# Patient Record
Sex: Male | Born: 1970 | Race: Black or African American | Hispanic: No | Smoking: Current every day smoker
Health system: Southern US, Community
[De-identification: ages and names within clinical notes are randomized; demographics above are authoritative.]

## PROBLEM LIST (undated history)

## (undated) DIAGNOSIS — R7303 Prediabetes: Secondary | ICD-10-CM

## (undated) DIAGNOSIS — I10 Essential (primary) hypertension: Secondary | ICD-10-CM

## (undated) DIAGNOSIS — R0600 Dyspnea, unspecified: Secondary | ICD-10-CM

## (undated) DIAGNOSIS — M109 Gout, unspecified: Secondary | ICD-10-CM

## (undated) DIAGNOSIS — F419 Anxiety disorder, unspecified: Secondary | ICD-10-CM

## (undated) DIAGNOSIS — J45909 Unspecified asthma, uncomplicated: Secondary | ICD-10-CM

## (undated) DIAGNOSIS — M1990 Unspecified osteoarthritis, unspecified site: Secondary | ICD-10-CM

## (undated) HISTORY — PX: OTHER SURGICAL HISTORY: SHX169

## (undated) HISTORY — PX: NO PAST SURGERIES: SHX2092

## (undated) HISTORY — PX: WISDOM TOOTH EXTRACTION: SHX21

---

## 1997-12-17 ENCOUNTER — Emergency Department (HOSPITAL_COMMUNITY): Admission: EM | Admit: 1997-12-17 | Discharge: 1997-12-17 | Payer: Self-pay | Admitting: Emergency Medicine

## 1998-02-23 ENCOUNTER — Emergency Department (HOSPITAL_COMMUNITY): Admission: EM | Admit: 1998-02-23 | Discharge: 1998-02-24 | Payer: Self-pay | Admitting: Emergency Medicine

## 1998-03-08 ENCOUNTER — Emergency Department (HOSPITAL_COMMUNITY): Admission: EM | Admit: 1998-03-08 | Discharge: 1998-03-08 | Payer: Self-pay | Admitting: Emergency Medicine

## 1999-08-10 ENCOUNTER — Emergency Department (HOSPITAL_COMMUNITY): Admission: EM | Admit: 1999-08-10 | Discharge: 1999-08-10 | Payer: Self-pay

## 2000-05-09 ENCOUNTER — Encounter: Payer: Self-pay | Admitting: Emergency Medicine

## 2000-05-09 ENCOUNTER — Emergency Department (HOSPITAL_COMMUNITY): Admission: EM | Admit: 2000-05-09 | Discharge: 2000-05-09 | Payer: Self-pay | Admitting: Emergency Medicine

## 2002-06-06 ENCOUNTER — Encounter: Payer: Self-pay | Admitting: Emergency Medicine

## 2002-06-06 ENCOUNTER — Emergency Department (HOSPITAL_COMMUNITY): Admission: EM | Admit: 2002-06-06 | Discharge: 2002-06-06 | Payer: Self-pay | Admitting: Emergency Medicine

## 2003-01-23 ENCOUNTER — Emergency Department (HOSPITAL_COMMUNITY): Admission: EM | Admit: 2003-01-23 | Discharge: 2003-01-23 | Payer: Self-pay | Admitting: Emergency Medicine

## 2003-08-22 ENCOUNTER — Emergency Department (HOSPITAL_COMMUNITY): Admission: EM | Admit: 2003-08-22 | Discharge: 2003-08-22 | Payer: Self-pay | Admitting: Emergency Medicine

## 2003-08-24 ENCOUNTER — Emergency Department (HOSPITAL_COMMUNITY): Admission: EM | Admit: 2003-08-24 | Discharge: 2003-08-24 | Payer: Self-pay | Admitting: Emergency Medicine

## 2003-11-05 ENCOUNTER — Emergency Department (HOSPITAL_COMMUNITY): Admission: EM | Admit: 2003-11-05 | Discharge: 2003-11-06 | Payer: Self-pay | Admitting: Emergency Medicine

## 2004-01-22 ENCOUNTER — Emergency Department (HOSPITAL_COMMUNITY): Admission: EM | Admit: 2004-01-22 | Discharge: 2004-01-23 | Payer: Self-pay | Admitting: Emergency Medicine

## 2004-01-28 ENCOUNTER — Emergency Department (HOSPITAL_COMMUNITY): Admission: EM | Admit: 2004-01-28 | Discharge: 2004-01-28 | Payer: Self-pay | Admitting: Emergency Medicine

## 2004-03-23 ENCOUNTER — Emergency Department (HOSPITAL_COMMUNITY): Admission: EM | Admit: 2004-03-23 | Discharge: 2004-03-23 | Payer: Self-pay | Admitting: Family Medicine

## 2004-07-22 ENCOUNTER — Emergency Department (HOSPITAL_COMMUNITY): Admission: EM | Admit: 2004-07-22 | Discharge: 2004-07-22 | Payer: Self-pay | Admitting: Emergency Medicine

## 2004-11-12 ENCOUNTER — Emergency Department (HOSPITAL_COMMUNITY): Admission: EM | Admit: 2004-11-12 | Discharge: 2004-11-12 | Payer: Self-pay | Admitting: Emergency Medicine

## 2005-01-31 ENCOUNTER — Emergency Department (HOSPITAL_COMMUNITY): Admission: EM | Admit: 2005-01-31 | Discharge: 2005-01-31 | Payer: Self-pay | Admitting: Emergency Medicine

## 2005-02-05 ENCOUNTER — Emergency Department (HOSPITAL_COMMUNITY): Admission: EM | Admit: 2005-02-05 | Discharge: 2005-02-05 | Payer: Self-pay | Admitting: Emergency Medicine

## 2005-05-03 ENCOUNTER — Emergency Department (HOSPITAL_COMMUNITY): Admission: EM | Admit: 2005-05-03 | Discharge: 2005-05-03 | Payer: Self-pay | Admitting: Emergency Medicine

## 2005-05-20 ENCOUNTER — Emergency Department (HOSPITAL_COMMUNITY): Admission: EM | Admit: 2005-05-20 | Discharge: 2005-05-21 | Payer: Self-pay | Admitting: Emergency Medicine

## 2005-12-11 ENCOUNTER — Emergency Department (HOSPITAL_COMMUNITY): Admission: EM | Admit: 2005-12-11 | Discharge: 2005-12-11 | Payer: Self-pay | Admitting: Emergency Medicine

## 2006-01-24 ENCOUNTER — Emergency Department (HOSPITAL_COMMUNITY): Admission: EM | Admit: 2006-01-24 | Discharge: 2006-01-24 | Payer: Self-pay | Admitting: Emergency Medicine

## 2006-03-06 ENCOUNTER — Emergency Department (HOSPITAL_COMMUNITY): Admission: EM | Admit: 2006-03-06 | Discharge: 2006-03-07 | Payer: Self-pay | Admitting: Emergency Medicine

## 2006-04-03 ENCOUNTER — Emergency Department (HOSPITAL_COMMUNITY): Admission: EM | Admit: 2006-04-03 | Discharge: 2006-04-03 | Payer: Self-pay | Admitting: Emergency Medicine

## 2007-03-03 ENCOUNTER — Emergency Department (HOSPITAL_COMMUNITY): Admission: EM | Admit: 2007-03-03 | Discharge: 2007-03-03 | Payer: Self-pay | Admitting: Emergency Medicine

## 2007-04-30 ENCOUNTER — Emergency Department (HOSPITAL_COMMUNITY): Admission: EM | Admit: 2007-04-30 | Discharge: 2007-04-30 | Payer: Self-pay | Admitting: Emergency Medicine

## 2007-07-21 ENCOUNTER — Emergency Department (HOSPITAL_COMMUNITY): Admission: EM | Admit: 2007-07-21 | Discharge: 2007-07-21 | Payer: Self-pay | Admitting: Family Medicine

## 2007-08-07 ENCOUNTER — Emergency Department (HOSPITAL_COMMUNITY): Admission: EM | Admit: 2007-08-07 | Discharge: 2007-08-07 | Payer: Self-pay | Admitting: Emergency Medicine

## 2008-01-25 ENCOUNTER — Emergency Department (HOSPITAL_COMMUNITY): Admission: EM | Admit: 2008-01-25 | Discharge: 2008-01-25 | Payer: Self-pay | Admitting: Emergency Medicine

## 2008-07-20 ENCOUNTER — Emergency Department (HOSPITAL_COMMUNITY): Admission: EM | Admit: 2008-07-20 | Discharge: 2008-07-20 | Payer: Self-pay | Admitting: Emergency Medicine

## 2008-08-15 ENCOUNTER — Emergency Department (HOSPITAL_COMMUNITY): Admission: AC | Admit: 2008-08-15 | Discharge: 2008-08-15 | Payer: Self-pay

## 2008-08-24 ENCOUNTER — Emergency Department (HOSPITAL_COMMUNITY): Admission: EM | Admit: 2008-08-24 | Discharge: 2008-08-24 | Payer: Self-pay | Admitting: Emergency Medicine

## 2008-10-02 ENCOUNTER — Emergency Department (HOSPITAL_COMMUNITY): Admission: EM | Admit: 2008-10-02 | Discharge: 2008-10-02 | Payer: Self-pay | Admitting: Emergency Medicine

## 2009-01-06 ENCOUNTER — Inpatient Hospital Stay (HOSPITAL_COMMUNITY): Admission: EM | Admit: 2009-01-06 | Discharge: 2009-01-07 | Payer: Self-pay | Admitting: Emergency Medicine

## 2009-01-06 ENCOUNTER — Ambulatory Visit: Payer: Self-pay | Admitting: Internal Medicine

## 2009-01-28 ENCOUNTER — Emergency Department (HOSPITAL_COMMUNITY): Admission: EM | Admit: 2009-01-28 | Discharge: 2009-01-28 | Payer: Self-pay | Admitting: Emergency Medicine

## 2009-02-13 ENCOUNTER — Emergency Department (HOSPITAL_COMMUNITY): Admission: EM | Admit: 2009-02-13 | Discharge: 2009-02-13 | Payer: Self-pay | Admitting: Family Medicine

## 2009-11-27 ENCOUNTER — Observation Stay (HOSPITAL_COMMUNITY): Admission: EM | Admit: 2009-11-27 | Discharge: 2009-11-27 | Payer: Self-pay | Admitting: Emergency Medicine

## 2010-04-05 LAB — PROTIME-INR
INR: 0.97 (ref 0.00–1.49)
Prothrombin Time: 13.1 seconds (ref 11.6–15.2)

## 2010-04-05 LAB — DIFFERENTIAL
Basophils Absolute: 0 10*3/uL (ref 0.0–0.1)
Basophils Relative: 0 % (ref 0–1)
Eosinophils Absolute: 0.2 10*3/uL (ref 0.0–0.7)
Eosinophils Relative: 3 % (ref 0–5)
Neutrophils Relative %: 63 % (ref 43–77)

## 2010-04-05 LAB — COMPREHENSIVE METABOLIC PANEL
ALT: 19 U/L (ref 0–53)
AST: 41 U/L — ABNORMAL HIGH (ref 0–37)
CO2: 25 mEq/L (ref 19–32)
Calcium: 8.4 mg/dL (ref 8.4–10.5)
Chloride: 111 mEq/L (ref 96–112)
GFR calc Af Amer: >60 mL/min (ref >60)
GFR calc non Af Amer: >60 mL/min (ref >60)
Glucose, Bld: 104 mg/dL — ABNORMAL HIGH (ref 70–99)
Sodium: 145 mEq/L (ref 135–145)
Total Bilirubin: 0.5 mg/dL (ref 0.3–1.2)

## 2010-04-05 LAB — CBC
HCT: 41 % (ref 39.0–52.0)
Hemoglobin: 13.7 g/dL (ref 13.0–17.0)
Hemoglobin: 14 g/dL (ref 13.0–17.0)
MCH: 30.2 pg (ref 26.0–34.0)
MCH: 30.3 pg (ref 26.0–34.0)
MCHC: 33.3 g/dL (ref 30.0–36.0)
MCHC: 34.1 g/dL (ref 30.0–36.0)
Platelets: 211 10*3/uL (ref 150–400)
RBC: 4.62 MIL/uL (ref 4.22–5.81)
RDW: 16.6 % — ABNORMAL HIGH (ref 11.5–15.5)

## 2010-04-05 LAB — BASIC METABOLIC PANEL
BUN: 7 mg/dL (ref 6–23)
CO2: 25 mEq/L (ref 19–32)
Calcium: 8.5 mg/dL (ref 8.4–10.5)
Creatinine, Ser: 0.91 mg/dL (ref 0.4–1.5)
GFR calc non Af Amer: >60 mL/min (ref >60)
Glucose, Bld: 94 mg/dL (ref 70–99)

## 2010-04-05 LAB — RAPID URINE DRUG SCREEN, HOSP PERFORMED
Amphetamines: NOT DETECTED
Barbiturates: NOT DETECTED
Benzodiazepines: NOT DETECTED
Cocaine: NOT DETECTED
Opiates: NOT DETECTED
Tetrahydrocannabinol: NOT DETECTED

## 2010-04-05 LAB — ETHANOL: Alcohol, Ethyl (B): 386 mg/dL — ABNORMAL HIGH (ref 0–10)

## 2010-04-05 LAB — MRSA PCR SCREENING: MRSA by PCR: NEGATIVE

## 2010-04-25 LAB — CBC
Hemoglobin: 11.7 g/dL — ABNORMAL LOW (ref 13.0–17.0)
MCHC: 34.4 g/dL (ref 30.0–36.0)
Platelets: 104 10*3/uL — ABNORMAL LOW (ref 150–400)
RDW: 15.4 % (ref 11.5–15.5)

## 2010-04-25 LAB — BASIC METABOLIC PANEL
BUN: 5 mg/dL — ABNORMAL LOW (ref 6–23)
CO2: 30 mEq/L (ref 19–32)
Calcium: 8.8 mg/dL (ref 8.4–10.5)
Glucose, Bld: 83 mg/dL (ref 70–99)
Sodium: 138 mEq/L (ref 135–145)

## 2010-04-25 LAB — PHOSPHORUS: Phosphorus: 4.7 mg/dL — ABNORMAL HIGH (ref 2.3–4.6)

## 2010-04-26 LAB — COMPREHENSIVE METABOLIC PANEL
ALT: 38 U/L (ref 0–53)
CO2: 24 mEq/L (ref 19–32)
Calcium: 9.4 mg/dL (ref 8.4–10.5)
Creatinine, Ser: 0.87 mg/dL (ref 0.4–1.5)
GFR calc non Af Amer: >60 mL/min (ref >60)
Glucose, Bld: 144 mg/dL — ABNORMAL HIGH (ref 70–99)

## 2010-04-26 LAB — URINALYSIS, ROUTINE W REFLEX MICROSCOPIC
Bilirubin Urine: NEGATIVE
Glucose, UA: NEGATIVE mg/dL
Ketones, ur: NEGATIVE mg/dL
pH: 6.5 (ref 5.0–8.0)

## 2010-04-26 LAB — URINE MICROSCOPIC-ADD ON

## 2010-04-26 LAB — DIFFERENTIAL
Eosinophils Absolute: 0 10*3/uL (ref 0.0–0.7)
Lymphocytes Relative: 16 % (ref 12–46)
Lymphs Abs: 1.2 10*3/uL (ref 0.7–4.0)
Neutrophils Relative %: 72 % (ref 43–77)

## 2010-04-26 LAB — VITAMIN B12: Vitamin B-12: 663 pg/mL (ref 211–911)

## 2010-04-26 LAB — RAPID URINE DRUG SCREEN, HOSP PERFORMED
Barbiturates: NOT DETECTED
Opiates: NOT DETECTED

## 2010-04-26 LAB — CBC
HCT: 38.2 % — ABNORMAL LOW (ref 39.0–52.0)
Hemoglobin: 13.1 g/dL (ref 13.0–17.0)
MCHC: 34.3 g/dL (ref 30.0–36.0)
MCV: 104.7 fL — ABNORMAL HIGH (ref 78.0–100.0)
RBC: 3.65 MIL/uL — ABNORMAL LOW (ref 4.22–5.81)

## 2010-04-26 LAB — PHOSPHORUS: Phosphorus: 3.8 mg/dL (ref 2.3–4.6)

## 2010-04-26 LAB — FOLATE: Folate: >20 ng/mL

## 2010-04-29 LAB — DIFFERENTIAL
Basophils Absolute: 0 10*3/uL (ref 0.0–0.1)
Eosinophils Absolute: 0.1 10*3/uL (ref 0.0–0.7)
Eosinophils Relative: 2 % (ref 0–5)
Lymphocytes Relative: 19 % (ref 12–46)
Neutrophils Relative %: 68 % (ref 43–77)

## 2010-04-29 LAB — BASIC METABOLIC PANEL
BUN: 7 mg/dL (ref 6–23)
CO2: 27 mEq/L (ref 19–32)
Chloride: 99 mEq/L (ref 96–112)
Creatinine, Ser: 0.64 mg/dL (ref 0.4–1.5)
GFR calc Af Amer: >60 mL/min (ref >60)
GFR calc non Af Amer: >60 mL/min (ref >60)
Sodium: 140 mEq/L (ref 135–145)

## 2010-04-29 LAB — CBC
HCT: 35.8 % — ABNORMAL LOW (ref 39.0–52.0)
Platelets: 90 10*3/uL — ABNORMAL LOW (ref 150–400)
RDW: 15.6 % — ABNORMAL HIGH (ref 11.5–15.5)
WBC: 5.4 10*3/uL (ref 4.0–10.5)

## 2010-04-29 LAB — RAPID URINE DRUG SCREEN, HOSP PERFORMED
Barbiturates: NOT DETECTED
Opiates: NOT DETECTED

## 2010-05-01 LAB — TYPE AND SCREEN
ABO/RH(D): AB POS
Antibody Screen: NEGATIVE

## 2010-05-01 LAB — COMPREHENSIVE METABOLIC PANEL
BUN: 12 mg/dL (ref 6–23)
CO2: 22 mEq/L (ref 19–32)
Calcium: 8.9 mg/dL (ref 8.4–10.5)
Creatinine, Ser: 1.21 mg/dL (ref 0.4–1.5)
GFR calc non Af Amer: >60 mL/min (ref >60)
Glucose, Bld: 111 mg/dL — ABNORMAL HIGH (ref 70–99)

## 2010-05-01 LAB — CBC
Hemoglobin: 13.3 g/dL (ref 13.0–17.0)
MCHC: 33.8 g/dL (ref 30.0–36.0)
MCV: 104.5 fL — ABNORMAL HIGH (ref 78.0–100.0)
RBC: 3.77 MIL/uL — ABNORMAL LOW (ref 4.22–5.81)

## 2010-05-01 LAB — DIFFERENTIAL
Eosinophils Absolute: 0.1 10*3/uL (ref 0.0–0.7)
Lymphocytes Relative: 42 % (ref 12–46)
Lymphs Abs: 2.6 10*3/uL (ref 0.7–4.0)
Neutro Abs: 3.1 10*3/uL (ref 1.7–7.7)
Neutrophils Relative %: 50 % (ref 43–77)

## 2010-05-09 LAB — POCT RAPID STREP A (OFFICE): Streptococcus, Group A Screen (Direct): NEGATIVE
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
## 2010-10-14 LAB — URINALYSIS, ROUTINE W REFLEX MICROSCOPIC
Glucose, UA: NEGATIVE
Ketones, ur: 15 — AB
pH: 5.5

## 2010-10-14 LAB — URINE MICROSCOPIC-ADD ON

## 2010-10-14 LAB — CBC
HCT: 40.7
Platelets: 161
WBC: 5.9

## 2010-10-14 LAB — DIFFERENTIAL
Eosinophils Relative: 3
Lymphocytes Relative: 36
Lymphs Abs: 2.1

## 2010-12-21 IMAGING — CR DG KNEE COMPLETE 4+V*R*
4 series · 4 of 4 positions shown · non-contrast
Comparison: 07/21/2007

CLINICAL DATA: Fell.  Pain.

RIGHT KNEE - COMPLETE 4+ VIEW

[t knee ap right]
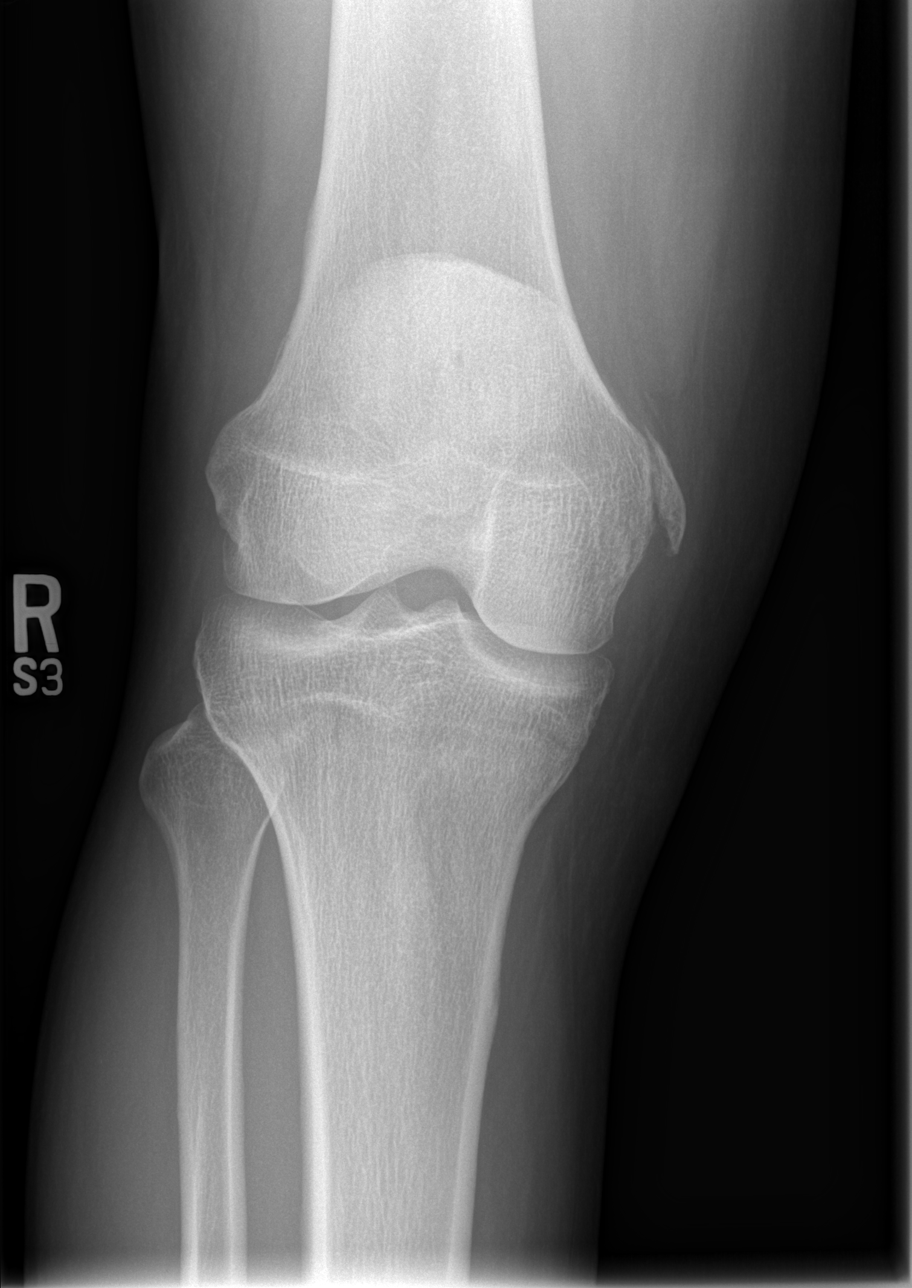

[t knee oblique right (1 of 2)]
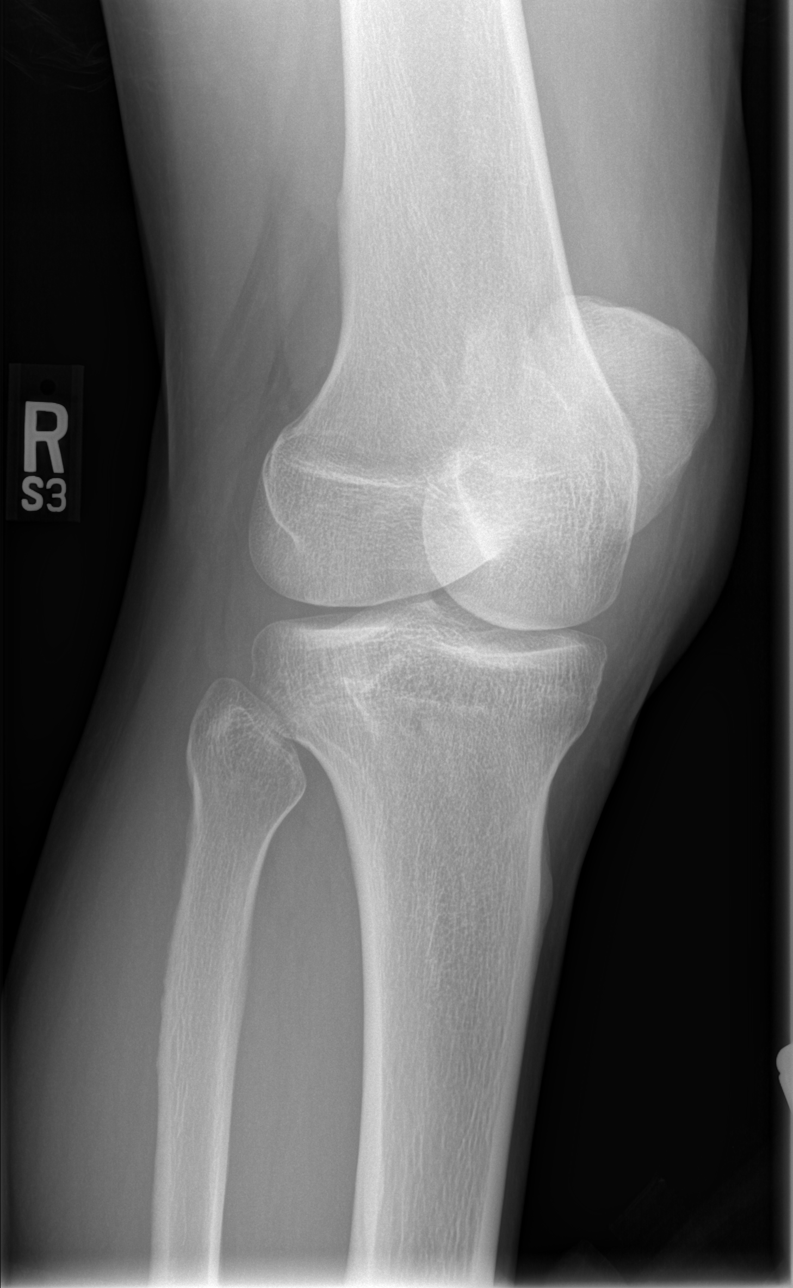

[t knee oblique right (2 of 2)]
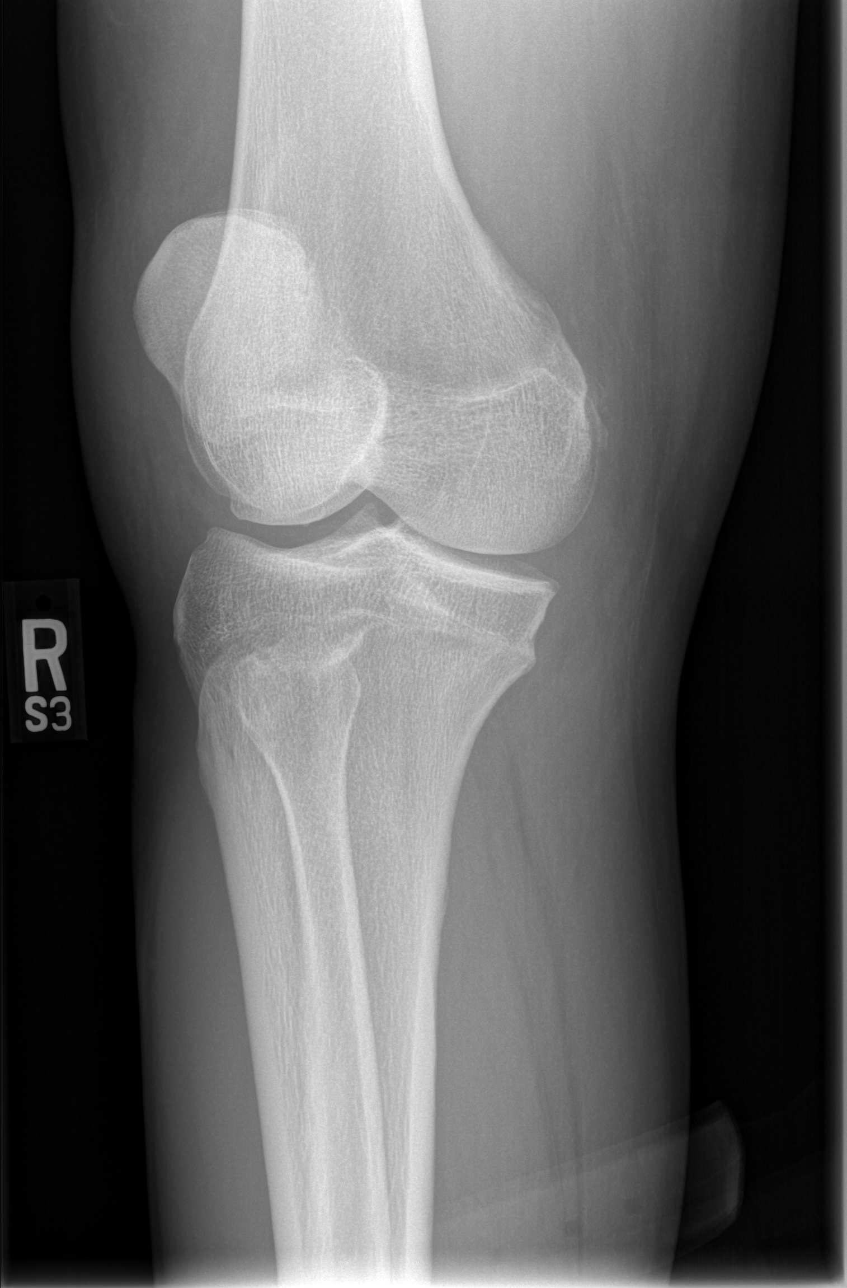

[t knee lat right]
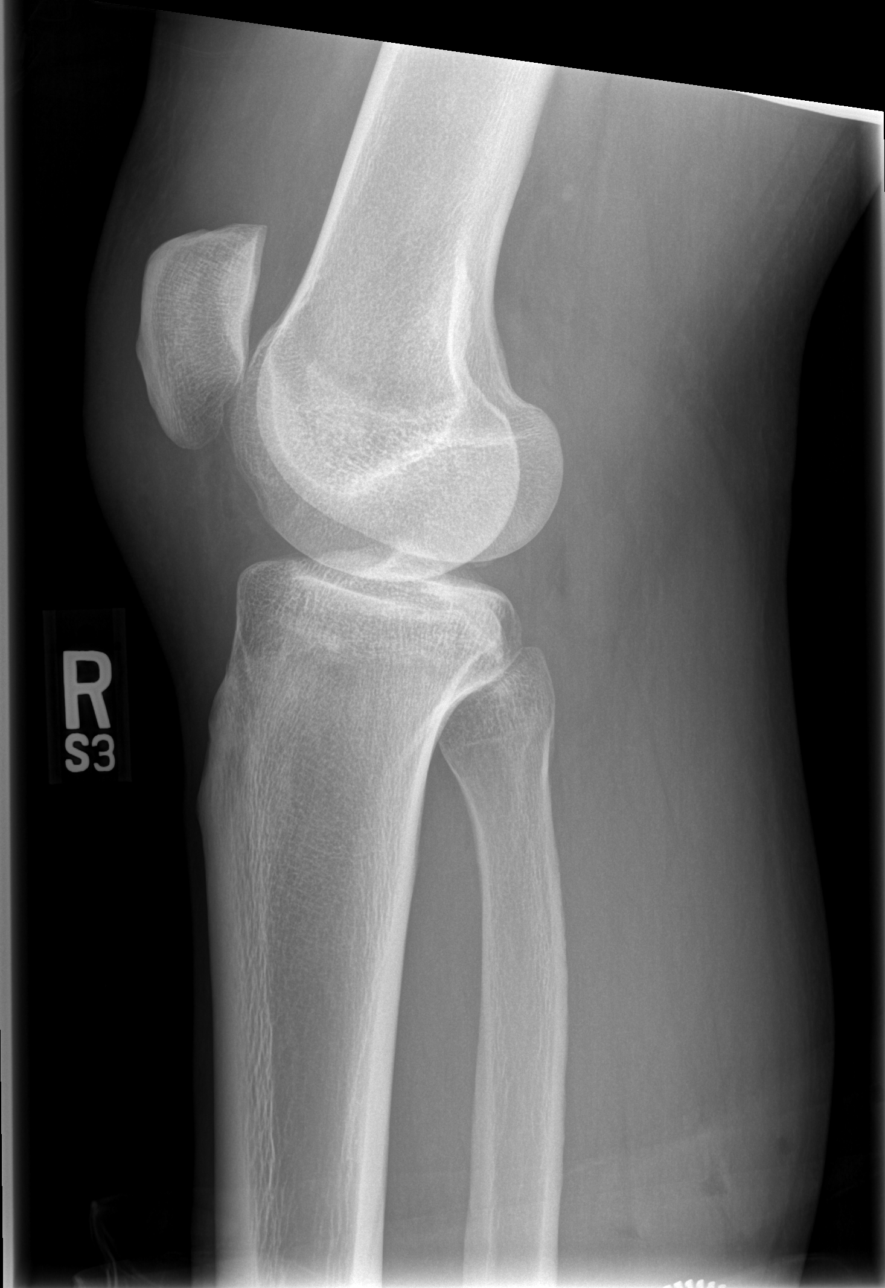

[4 of 4 positions shown; findings below may reference images not displayed]

FINDINGS: There is an acute  avulsion at the medial aspect of the
medial femoral condyle consistent with  avulsion at the middle
collateral ligament attachment.  There is pronounced soft tissue
swelling diffusely.  There is a joint effusion.  There may be a
fracture of the anterolateral aspect of the lateral femoral
condyle.  Findings raise the possibility of an ACL tear mechanism.
IMPRESSION: Joint effusion.  Soft tissue swelling.  Avulsion fracture medial
aspect medial femoral condyle related to the MCL attachment.
Question anterolateral femoral condyle osteochondral fracture.
Findings suggest possible ACL injury mechanism.

## 2011-01-25 ENCOUNTER — Encounter: Payer: Self-pay | Admitting: *Deleted

## 2011-01-25 ENCOUNTER — Emergency Department (HOSPITAL_COMMUNITY)
Admission: EM | Admit: 2011-01-25 | Discharge: 2011-01-25 | Discharge status: Against Medical Advice | Payer: Self-pay | Attending: Emergency Medicine | Admitting: Emergency Medicine

## 2011-01-25 DIAGNOSIS — S61209A Unspecified open wound of unspecified finger without damage to nail, initial encounter: Secondary | ICD-10-CM | POA: Insufficient documentation

## 2011-01-25 DIAGNOSIS — X58XXXA Exposure to other specified factors, initial encounter: Secondary | ICD-10-CM | POA: Insufficient documentation

## 2011-01-25 HISTORY — DX: Essential (primary) hypertension: I10

## 2011-01-25 NOTE — ED Notes (Signed)
No patient in room.

## 2011-01-25 NOTE — ED Notes (Signed)
No patient in room at this time.
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
## 2011-03-01 ENCOUNTER — Emergency Department (HOSPITAL_COMMUNITY)
Admission: EM | Admit: 2011-03-01 | Discharge: 2011-03-01 | Disposition: A | Discharge status: Home / Self Care | Payer: Self-pay | Attending: Emergency Medicine | Admitting: Emergency Medicine

## 2011-03-01 ENCOUNTER — Encounter (HOSPITAL_COMMUNITY): Payer: Self-pay | Admitting: Emergency Medicine

## 2011-03-01 ENCOUNTER — Encounter (HOSPITAL_COMMUNITY): Payer: Self-pay | Admitting: *Deleted

## 2011-03-01 ENCOUNTER — Emergency Department (INDEPENDENT_AMBULATORY_CARE_PROVIDER_SITE_OTHER)
Admission: EM | Admit: 2011-03-01 | Discharge: 2011-03-01 | Disposition: A | Discharge status: Other Acute Inpatient Hospital | Payer: Self-pay | Source: Home / Self Care | Attending: Emergency Medicine | Admitting: Emergency Medicine

## 2011-03-01 DIAGNOSIS — I82409 Acute embolism and thrombosis of unspecified deep veins of unspecified lower extremity: Secondary | ICD-10-CM

## 2011-03-01 DIAGNOSIS — I82401 Acute embolism and thrombosis of unspecified deep veins of right lower extremity: Secondary | ICD-10-CM

## 2011-03-01 DIAGNOSIS — R609 Edema, unspecified: Secondary | ICD-10-CM | POA: Insufficient documentation

## 2011-03-01 DIAGNOSIS — M79609 Pain in unspecified limb: Secondary | ICD-10-CM | POA: Insufficient documentation

## 2011-03-01 DIAGNOSIS — R600 Localized edema: Secondary | ICD-10-CM

## 2011-03-01 DIAGNOSIS — J45909 Unspecified asthma, uncomplicated: Secondary | ICD-10-CM | POA: Insufficient documentation

## 2011-03-01 DIAGNOSIS — I10 Essential (primary) hypertension: Secondary | ICD-10-CM | POA: Insufficient documentation

## 2011-03-01 DIAGNOSIS — M7989 Other specified soft tissue disorders: Secondary | ICD-10-CM | POA: Insufficient documentation

## 2011-03-01 MED ORDER — ENOXAPARIN SODIUM 100 MG/ML ~~LOC~~ SOLN
95.0000 mg | SUBCUTANEOUS | Status: AC
  Administered 2011-03-01: 95 mg via SUBCUTANEOUS
  Filled 2011-03-01: qty 1

## 2011-03-01 MED ORDER — ENOXAPARIN SODIUM 100 MG/ML ~~LOC~~ SOLN: 95.0000 mg | Freq: Once | SUBCUTANEOUS | Status: DC

## 2011-03-01 NOTE — ED Provider Notes (Signed)
Medical screening examination/treatment/procedure(s) were performed by non-physician practitioner and as supervising physician I was immediately available for consultation/collaboration.  Tryston Gilliam, MD 03/01/11 2054 

## 2011-03-01 NOTE — ED Provider Notes (Addendum)
History     CSN: [id number]  Arrival date & time 03/01/11  1659   First MD Initiated Contact with Patient 03/01/11 1904      Chief Complaint  Patient presents with  . Leg Pain  . Leg Swelling    (Consider location/radiation/quality/duration/timing/severity/associated sxs/prior treatment) HPI Comments: [name] presents to the urgent care center tonight complaining of right cough swelling and pain this started about 3 days worsening in the last day or so. Denies any shortness of breath chest pain or palpitations. He describes today's pain recently not moving a lot and watching a lot of TV for unknown reasons patient does not elaborate. Patient looks in mild discomfort and concern. Denies previous history of any blood disorders previous blood clots or lung problems.  Patient is a 41 y.o. male presenting with leg pain. The history is provided by the patient.  Leg Pain  There was no injury mechanism. The pain is present in the right leg. The pain is at a severity of 10/10. The pain is severe. The pain has been constant since onset. Associated symptoms include inability to bear weight. Pertinent negatives include no numbness, no loss of motion and no loss of sensation. The symptoms are aggravated by activity, bearing weight and palpation. He has tried nothing for the symptoms.    Past Medical History  Diagnosis Date  . Hypertension   . Asthma     History reviewed. No pertinent past surgical history.  No family history on file.  History  Substance Use Topics  . Smoking status: Current Everyday Smoker  . Smokeless tobacco: Not on file  . Alcohol Use: Yes      Review of Systems  Constitutional: Negative for fever and fatigue.  Cardiovascular: Positive for leg swelling. Negative for chest pain and palpitations.  Musculoskeletal: Positive for gait problem. Negative for joint swelling.  Neurological: Negative for dizziness, syncope and numbness.    Allergies  Review of  patient's allergies indicates no known allergies.  Home Medications  No current outpatient prescriptions on file.  BP 191/78  Pulse 125  Temp(Src) 99 F (37.2 C) (Oral)  Resp 20  SpO2 96%  Physical Exam  Nursing note and vitals reviewed. Eyes: Conjunctivae are normal.  Cardiovascular: Regular rhythm.   No extrasystoles are present. Tachycardia present.   Pulmonary/Chest: Effort normal and breath sounds normal. He has no wheezes. He has no rhonchi.  Musculoskeletal:       Right knee: He exhibits swelling. He exhibits no deformity.       Legs: Skin: No rash noted.    ED Course  Procedures (including critical care time)  Labs Reviewed - No data to display No results found.   1. Deep vein blood clot of right lower extremity       MDM  High index suspicion for DVT RLE        Jimmie Molly, MD 03/01/11 1938  Jimmie Molly, MD 03/01/11 4098

## 2011-03-01 NOTE — ED Notes (Signed)
Pt from urgent care with c/o rt lower extremity edema x three days. R/O DVT.

## 2011-03-01 NOTE — ED Provider Notes (Signed)
History     CSN: [id number]  Arrival date & time 03/01/11  1946   First MD Initiated Contact with Patient 03/01/11 2011      Chief Complaint  Patient presents with  . 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  Was seen at urgent care Center.  The emergency room for further evaluation.  Unfortunately, is after 7:00 at night , and we'll have to give dose of Lovenox in the ED and bring patient back in the morning for a vascular study of his lower leg to rule out DVT  Patient is a 41 y.o. male presenting with leg pain. The history is provided by the patient.  Leg Pain  The incident occurred more than 2 days ago. The incident occurred at home. The pain is present in the right leg. The quality of the pain is described as aching. The pain is at a severity of 3/10. The pain is mild. The pain has been constant since onset. Pertinent negatives include no numbness, no inability to bear weight, no loss of motion, no muscle weakness, no loss of sensation and no tingling. The symptoms are aggravated by activity. He has tried nothing for the symptoms.    Past Medical History  Diagnosis Date  . Hypertension   . Asthma     History reviewed. No pertinent past surgical history.  No family history on file.  History  Substance Use Topics  . Smoking status: Current Everyday Smoker  . Smokeless tobacco: Not on file  . Alcohol Use: Yes      Review of Systems  Constitutional: Negative for fever and chills.  Respiratory: Negative for shortness of breath.   Cardiovascular: Positive for leg swelling. Negative for chest pain.  Neurological: Negative for dizziness, tingling and numbness.    Allergies  Review of patient's allergies indicates no known allergies.  Home Medications  No current  outpatient prescriptions on file.  BP 136/95  Pulse 109  Temp(Src) 98.7 F (37.1 C) (Oral)  Wt 211 lb 3.2 oz (95.8 kg)  SpO2 96%  Physical Exam  Constitutional: He is oriented to person, place, and time. He appears well-developed.  HENT:  Head: Normocephalic.  Eyes: Pupils are equal, round, and reactive to light.  Neck: Normal range of motion.  Cardiovascular: Normal rate.   Pulmonary/Chest: Effort normal.  Musculoskeletal: Normal range of motion.  Neurological: He is alert and oriented to person, place, and time.  Skin: Skin is warm and dry.    ED Course  Procedures (including critical care time)  Labs Reviewed - No data to display No results found.   1. Lower extremity edema       MDM  We'll administer Lovenox , weight appropriate 95 milligrams bring patient back in the morning for ultrasound of his lower leg to rule out DVT        Arman Filter, NP 03/01/11 2053  Arman Filter, NP 03/01/11 2054

## 2011-03-01 NOTE — ED Notes (Signed)
PT HERE EITH SUDDEN ONSET OF RIGHT CALF SWELLING THAT STARTED X 3 DYS AGO.PT STATES THE PAIN HAS AFFECTED MOBILITY.SHOOTING CONSTANT THROB,ACHY PAIN GOING TO THIGH

## 2011-03-02 ENCOUNTER — Emergency Department (HOSPITAL_COMMUNITY)
Admission: EM | Admit: 2011-03-02 | Discharge: 2011-03-02 | Discharge status: Against Medical Advice | Payer: Self-pay | Attending: Emergency Medicine | Admitting: Emergency Medicine

## 2011-03-02 ENCOUNTER — Ambulatory Visit (HOSPITAL_COMMUNITY)
Admission: RE | Admit: 2011-03-02 | Discharge: 2011-03-02 | Disposition: A | Discharge status: Home / Self Care | Payer: Self-pay | Source: Ambulatory Visit | Attending: Emergency Medicine | Admitting: Emergency Medicine

## 2011-03-02 ENCOUNTER — Encounter (HOSPITAL_COMMUNITY): Payer: Self-pay | Admitting: Emergency Medicine

## 2011-03-02 ENCOUNTER — Emergency Department (HOSPITAL_COMMUNITY)
Admission: EM | Admit: 2011-03-02 | Discharge: 2011-03-02 | Disposition: A | Discharge status: Home / Self Care | Payer: Self-pay | Attending: Emergency Medicine | Admitting: Emergency Medicine

## 2011-03-02 DIAGNOSIS — M25569 Pain in unspecified knee: Secondary | ICD-10-CM | POA: Insufficient documentation

## 2011-03-02 DIAGNOSIS — M660 Rupture of popliteal cyst: Secondary | ICD-10-CM

## 2011-03-02 DIAGNOSIS — I10 Essential (primary) hypertension: Secondary | ICD-10-CM | POA: Insufficient documentation

## 2011-03-02 DIAGNOSIS — M7989 Other specified soft tissue disorders: Secondary | ICD-10-CM | POA: Insufficient documentation

## 2011-03-02 DIAGNOSIS — R609 Edema, unspecified: Secondary | ICD-10-CM

## 2011-03-02 DIAGNOSIS — M7120 Synovial cyst of popliteal space [Baker], unspecified knee: Secondary | ICD-10-CM | POA: Insufficient documentation

## 2011-03-02 DIAGNOSIS — J45909 Unspecified asthma, uncomplicated: Secondary | ICD-10-CM | POA: Insufficient documentation

## 2011-03-02 DIAGNOSIS — M25469 Effusion, unspecified knee: Secondary | ICD-10-CM | POA: Insufficient documentation

## 2011-03-02 DIAGNOSIS — M79609 Pain in unspecified limb: Secondary | ICD-10-CM

## 2011-03-02 DIAGNOSIS — Z0389 Encounter for observation for other suspected diseases and conditions ruled out: Secondary | ICD-10-CM | POA: Insufficient documentation

## 2011-03-02 DIAGNOSIS — Z5320 Procedure and treatment not carried out because of patient's decision for unspecified reasons: Secondary | ICD-10-CM | POA: Insufficient documentation

## 2011-03-02 DIAGNOSIS — J069 Acute upper respiratory infection, unspecified: Secondary | ICD-10-CM | POA: Insufficient documentation

## 2011-03-02 MED ORDER — IBUPROFEN 600 MG PO TABS: 600.0000 mg | ORAL_TABLET | Freq: Four times a day (QID) | ORAL | Status: AC | PRN

## 2011-03-02 MED ORDER — OXYCODONE-ACETAMINOPHEN 7.5-325 MG PO TABS: 1.0000 | ORAL_TABLET | ORAL | Status: AC | PRN

## 2011-03-02 NOTE — ED Provider Notes (Signed)
History     CSN: [id number]  Arrival date & time 03/02/11  1056   First MD Initiated Contact with Patient 03/02/11 1246      Chief Complaint  Patient presents with  . URI  . Knee Pain     HPI Pt c/o right knee pain and had doppler today and found ruptured bakers cyst; pt sts need pain meds and has had cold sx x several days  Past Medical History  Diagnosis Date  . Hypertension   . Asthma     History reviewed. No pertinent past surgical history.  History reviewed. No pertinent family history.  History  Substance Use Topics  . Smoking status: Current Everyday Smoker  . Smokeless tobacco: Not on file  . Alcohol Use: Yes      Review of Systems Negative except as noted in history of present illness Allergies  Review of patient's allergies indicates no known allergies.  Home Medications   Current Outpatient Rx  Name Route Sig Dispense Refill  . IBUPROFEN 600 MG PO TABS Oral Take 1 tablet (600 mg total) by mouth every 6 (six) hours as needed for pain. 30 tablet 0  . OXYCODONE-ACETAMINOPHEN 7.5-325 MG PO TABS Oral Take 1 tablet by mouth every 4 (four) hours as needed for pain. 30 tablet 0    BP 149/109  Pulse 105  Temp(Src) 98.4 F (36.9 C) (Oral)  Resp 18  SpO2 97%  Physical Exam  Nursing note and vitals reviewed. Constitutional: He is oriented to person, place, and time. He appears well-developed and well-nourished. No distress.  HENT:  Head: Normocephalic and atraumatic.  Eyes: Pupils are equal, round, and reactive to light.  Neck: Normal range of motion.  Cardiovascular: Normal rate and intact distal pulses.   Pulmonary/Chest: No respiratory distress.  Abdominal: Normal appearance. He exhibits no distension.  Musculoskeletal:       Legs: Neurological: He is alert and oriented to person, place, and time. No cranial nerve deficit.  Skin: Skin is warm and dry. No rash noted.  Psychiatric: He has a normal mood and affect. His behavior is normal.    ED  Course  Procedures (including critical care time)   Labs Reviewed  RAPID STREP SCREEN  LAB REPORT - SCANNED   No results found.   1. Ruptured Bakers cyst   2. URI, acute       MDM         Nelia Shi, MD 03/03/11 [phone]
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
## 2011-03-02 NOTE — ED Notes (Signed)
Pt c/o right knee pain and had doppler today and found ruptured bakers cyst; pt sts need pain meds and has had cold sx x several days

## 2011-09-15 ENCOUNTER — Emergency Department (HOSPITAL_COMMUNITY): Payer: Self-pay

## 2011-09-15 ENCOUNTER — Observation Stay (HOSPITAL_COMMUNITY)
Admission: EM | Admit: 2011-09-15 | Discharge: 2011-09-16 | Disposition: A | Discharge status: Home / Self Care | Payer: Self-pay | Attending: Emergency Medicine | Admitting: Emergency Medicine

## 2011-09-15 ENCOUNTER — Encounter (HOSPITAL_COMMUNITY): Payer: Self-pay | Admitting: *Deleted

## 2011-09-15 DIAGNOSIS — S0100XA Unspecified open wound of scalp, initial encounter: Principal | ICD-10-CM | POA: Insufficient documentation

## 2011-09-15 DIAGNOSIS — Y9355 Activity, bike riding: Secondary | ICD-10-CM | POA: Insufficient documentation

## 2011-09-15 DIAGNOSIS — Y929 Unspecified place or not applicable: Secondary | ICD-10-CM | POA: Insufficient documentation

## 2011-09-15 DIAGNOSIS — W19XXXA Unspecified fall, initial encounter: Secondary | ICD-10-CM | POA: Insufficient documentation

## 2011-09-15 DIAGNOSIS — F1010 Alcohol abuse, uncomplicated: Secondary | ICD-10-CM | POA: Insufficient documentation

## 2011-09-15 DIAGNOSIS — F17200 Nicotine dependence, unspecified, uncomplicated: Secondary | ICD-10-CM | POA: Insufficient documentation

## 2011-09-15 DIAGNOSIS — S0101XA Laceration without foreign body of scalp, initial encounter: Secondary | ICD-10-CM

## 2011-09-15 DIAGNOSIS — F10929 Alcohol use, unspecified with intoxication, unspecified: Secondary | ICD-10-CM

## 2011-09-15 LAB — RAPID URINE DRUG SCREEN, HOSP PERFORMED
Amphetamines: NOT DETECTED
Barbiturates: NOT DETECTED
Benzodiazepines: NOT DETECTED
Cocaine: NOT DETECTED
Opiates: NOT DETECTED
Tetrahydrocannabinol: NOT DETECTED

## 2011-09-15 LAB — ETHANOL: Alcohol, Ethyl (B): 408 mg/dL (ref 0–11)

## 2011-09-15 MED ORDER — SODIUM CHLORIDE 0.9 % IV BOLUS (SEPSIS): 1000.0000 mL | Freq: Once | INTRAVENOUS | Status: DC

## 2011-09-15 MED ORDER — FOLIC ACID 1 MG PO TABS: 1.0000 mg | ORAL_TABLET | Freq: Once | ORAL | Status: DC

## 2011-09-15 MED ORDER — VITAMIN B-1 100 MG PO TABS: 100.0000 mg | ORAL_TABLET | Freq: Once | ORAL | Status: DC

## 2011-09-15 MED ORDER — SODIUM CHLORIDE 0.9 % IV BOLUS (SEPSIS)
1000.0000 mL | Freq: Once | INTRAVENOUS | Status: AC
  Administered 2011-09-15: 1000 mL via INTRAVENOUS

## 2011-09-15 NOTE — ED Notes (Signed)
Pt first IV bolus continues to infuse at this time, will start second bolus once it is completed.
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
## 2011-09-16 ENCOUNTER — Encounter (HOSPITAL_COMMUNITY): Payer: Self-pay

## 2011-09-16 LAB — GLUCOSE, CAPILLARY: Glucose-Capillary: 85 mg/dL (ref 70–99)

## 2011-09-16 NOTE — ED Notes (Signed)
Pt awakened for vitals and CBG check. Could not stay awake to answer more than one question at a time. Refused PO medication. Unable to give answer other than "yes or no". Denied pain. Respirations even and regular. Vitals WNL. NAD.

## 2011-09-16 NOTE — ED Provider Notes (Signed)
Patient is resting comfortably in bed. He would like to go home and feels well enough to do so. I explained that he should be able to ambulate without difficulty before discharge.   Filed Vitals:   09/16/11 0513  BP: 122/78  Pulse: 72  Temp:   Resp: 12   Patient is awake and alert and responding to questions appropriately. Heart has RRR. Lungs are CTA bilaterally. He is able to ambulate without difficulty. His head laceration has staples intact and is not bleeding.   Patient will eat breakfast and then be discharged with follow up for staple removal. Patient is agreeable to plan.   Emilia Beck, [place] 09/16/11 [phone]

## 2011-09-16 NOTE — ED Notes (Signed)
Pt sleeping. Respirations regular and even. NAD. Speech mumbled. Refused PO medications. Orientated to person only.

## 2011-09-16 NOTE — ED Notes (Addendum)
Pt sleeping. Difficult to arouse. Responds to name. Immediately falls back to sleep. Unable to verbalize pain. Respirations regular and even. Skin warm and dry. Pt unable to take oral meds at this time. NAD.

## 2011-09-17 NOTE — ED Provider Notes (Signed)
Medical screening examination/treatment/procedure(s) were performed by non-physician practitioner and as supervising physician I was immediately available for consultation/collaboration.  Raeford Razor, MD 09/17/11 [phone]

## 2011-10-09 ENCOUNTER — Encounter (HOSPITAL_COMMUNITY): Payer: Self-pay | Admitting: Emergency Medicine

## 2011-10-09 ENCOUNTER — Emergency Department (HOSPITAL_COMMUNITY)
Admission: EM | Admit: 2011-10-09 | Discharge: 2011-10-09 | Disposition: A | Discharge status: Home / Self Care | Payer: Self-pay | Attending: Emergency Medicine | Admitting: Emergency Medicine

## 2011-10-09 DIAGNOSIS — J45909 Unspecified asthma, uncomplicated: Secondary | ICD-10-CM | POA: Insufficient documentation

## 2011-10-09 DIAGNOSIS — I10 Essential (primary) hypertension: Secondary | ICD-10-CM | POA: Insufficient documentation

## 2011-10-09 DIAGNOSIS — F17200 Nicotine dependence, unspecified, uncomplicated: Secondary | ICD-10-CM | POA: Insufficient documentation

## 2011-10-09 DIAGNOSIS — Z4802 Encounter for removal of sutures: Secondary | ICD-10-CM | POA: Insufficient documentation

## 2011-10-09 NOTE — ED Provider Notes (Signed)
History  This chart was scribed for Daniel Anger, DO by Daniel [name]. The patient was seen in room TR04C/TR04C. Patient's care was started at 1135.     CSN: [id number]  Arrival date & time 10/09/11  1016   First MD Initiated Contact with Patient 10/09/11 1135      Chief Complaint  Patient presents with  . Suture / Staple Removal    The history is provided by the patient. No language interpreter was used.    Daniel [name] is a 41 y.o. male who presents to the Emergency Department needing staple removal from his right occipital area. Patient had 3 staples placed 2 weeks ago. Patient reports no other complaints at this time.  Denies fevers, no rash, no drainage from wound.     Past Medical History  Diagnosis Date  . Hypertension   . Asthma     History reviewed. No pertinent past surgical history.  History reviewed. No pertinent family history.  History  Substance Use Topics  . Smoking status: Current Every Day Smoker  . Smokeless tobacco: Not on file  . Alcohol Use: Yes      Review of Systems ROS: Statement: All systems negative except as marked or noted in the HPI; Constitutional: Negative for fever and chills. ; ; Eyes: Negative for eye pain, redness and discharge. ; ; ENMT: Negative for ear pain, hoarseness, nasal congestion, sinus pressure and sore throat. ; ; Cardiovascular: Negative for chest pain, palpitations, diaphoresis, dyspnea and peripheral edema. ; ; Respiratory: Negative for cough, wheezing and stridor. ; ; Gastrointestinal: Negative for nausea, vomiting, diarrhea, abdominal pain, blood in stool, hematemesis, jaundice and rectal bleeding. . ; ; Genitourinary: Negative for dysuria, flank pain and hematuria. ; ; Musculoskeletal: Negative for back pain and neck pain. Negative for swelling and trauma.; ; Skin: +staples. Negative for pruritus, rash, abrasions, blisters, bruising and skin lesion.; ; Neuro: Negative for headache, lightheadedness and neck  stiffness. Negative for weakness, altered level of consciousness , altered mental status, extremity weakness, paresthesias, involuntary movement, seizure and syncope.        Allergies  Review of patient's allergies indicates no known allergies.  Home Medications   Current Outpatient Rx  Name Route Sig Dispense Refill  . ALBUTEROL SULFATE HFA 108 (90 BASE) MCG/ACT IN AERS Inhalation Inhale 2 puffs into the lungs every 6 (six) hours as needed. For wheezing      BP 126/79  Pulse 115  Temp 98.7 F (37.1 C) (Oral)  Resp 16  SpO2 96%  Physical Exam 1140: Physical examination:  Nursing notes reviewed; Vital signs and O2 SAT reviewed;  Constitutional: Well developed, Well nourished, Well hydrated, In no acute distress; Head:  Normocephalic, atraumatic; Eyes: EOMI, PERRL, No scleral icterus; ENMT: Mouth and pharynx normal, Mucous membranes moist; Neck: Supple, Full range of motion, No lymphadenopathy; Cardiovascular: Regular rate and rhythm, No murmur, rub, or gallop; Respiratory: Breath sounds clear & equal bilaterally, No rales, rhonchi, wheezes.  Speaking full sentences with ease, Normal respiratory effort/excursion; Chest: Nontender, Movement normal;; Extremities: Pulses normal, No tenderness, No edema, No calf edema or asymmetry.; Neuro: AA&Ox3, Major CN grossly intact.  Speech clear. No gross focal motor or sensory deficits in extremities.; Skin: Color normal, Warm, Dry; +3 staples to right side of parietal scalp with wound edges well approximated, no erythema, no drainage, no edema, no fluctuance.   ED Course  Procedures   MDM  MDM Reviewed: nursing note, vitals and previous chart  1200:  3 staples removed from right side of head by ED RN intact. No signs of infection.  D/C and f/u with PMD prn.      I personally performed the services described in this documentation, which was scribed in my presence. The recorded information has been reviewed and  considered. Daniel [name] Daniel Quarry, DO 10/11/11 1258

## 2011-10-09 NOTE — ED Notes (Signed)
Pt here to have staples removed from back of head that pt sts were placed two weeks ago

## 2011-10-09 NOTE — ED Notes (Signed)
Patient returns to ED for removal of 3 staples from the right posterior scalp.

## 2011-10-31 ENCOUNTER — Emergency Department (HOSPITAL_COMMUNITY)
Admission: EM | Admit: 2011-10-31 | Discharge: 2011-11-01 | Disposition: A | Discharge status: Home / Self Care | Payer: Self-pay | Attending: Emergency Medicine | Admitting: Emergency Medicine

## 2011-10-31 ENCOUNTER — Emergency Department (HOSPITAL_COMMUNITY): Payer: Self-pay

## 2011-10-31 ENCOUNTER — Encounter (HOSPITAL_COMMUNITY): Payer: Self-pay | Admitting: Emergency Medicine

## 2011-10-31 DIAGNOSIS — IMO0002 Reserved for concepts with insufficient information to code with codable children: Secondary | ICD-10-CM | POA: Insufficient documentation

## 2011-10-31 DIAGNOSIS — I10 Essential (primary) hypertension: Secondary | ICD-10-CM | POA: Insufficient documentation

## 2011-10-31 DIAGNOSIS — S43409A Unspecified sprain of unspecified shoulder joint, initial encounter: Secondary | ICD-10-CM

## 2011-10-31 DIAGNOSIS — F17200 Nicotine dependence, unspecified, uncomplicated: Secondary | ICD-10-CM | POA: Insufficient documentation

## 2011-10-31 DIAGNOSIS — X500XXA Overexertion from strenuous movement or load, initial encounter: Secondary | ICD-10-CM | POA: Insufficient documentation

## 2011-10-31 DIAGNOSIS — J45909 Unspecified asthma, uncomplicated: Secondary | ICD-10-CM | POA: Insufficient documentation

## 2011-10-31 NOTE — ED Notes (Signed)
Pt reports having L shoulder pain after lifting lawn mower--also reports having back pain; states he drank a bunch of liquor to ease the pain

## 2011-10-31 NOTE — ED Notes (Signed)
Per ems, pt was lifting lawn mowers, and later on in evening started to have L shoulder , back pain; increased pain with ROM; cbg 106; + ETOH

## 2011-11-01 MED ORDER — IBUPROFEN 800 MG PO TABS: 800.0000 mg | ORAL_TABLET | Freq: Three times a day (TID) | ORAL | Status: DC | PRN

## 2011-11-01 MED ORDER — ALBUTEROL SULFATE HFA 108 (90 BASE) MCG/ACT IN AERS: 2.0000 | INHALATION_SPRAY | RESPIRATORY_TRACT | Status: DC | PRN

## 2011-11-01 MED ORDER — LISINOPRIL 20 MG PO TABS: 10.0000 mg | ORAL_TABLET | Freq: Every day | ORAL | Status: DC

## 2011-11-01 NOTE — Progress Notes (Signed)
Orthopedic Tech Progress Note Patient Details:  Daniel [name] 1970-09-10 [id number]  Ortho Devices Type of Ortho Device: Arm foam sling   [name] 11/01/2011, 1:05 AM

## 2011-11-01 NOTE — Progress Notes (Signed)
Orthopedic Tech Progress Note Patient Details:  Daniel [name] February 16, 1970 [id number]    sling  [name] 11/01/2011, 1:04 AM

## 2011-11-01 NOTE — ED Notes (Signed)
The patient is AOx4 and comfortable with the discharge instructions. 

## 2011-11-01 NOTE — ED Provider Notes (Signed)
History     CSN: [id number]  Arrival date & time 10/31/11  2324   First MD Initiated Contact with Patient 10/31/11 2337      Chief Complaint  Patient presents with  . Shoulder Injury   HPI  History provided by the patient. Patient is a 41 year old male with past history of hypertension and asthma who presents with complaints of left shoulder pain and injury. Patient states he was lifting a lawnmower and twisted his right side to place or more down. He complains of having some sharp pains into his left shoulder area. Since that time pain has been increasing and worse with any movements. Denies any numbness or weakness in his extremity. Patient has not taken any medications for symptoms but does admit to drinking alcohol to help with pain. He reports this is not helped significantly. He denies any other complaints at this time.     Past Medical History  Diagnosis Date  . Hypertension   . Asthma     History reviewed. No pertinent past surgical history.  History reviewed. No pertinent family history.  History  Substance Use Topics  . Smoking status: Current Every Day Smoker -- 0.2 packs/day    Types: Cigarettes  . Smokeless tobacco: Not on file  . Alcohol Use: Yes     occasion      Review of Systems  HENT: Negative for neck pain.   Respiratory: Negative for shortness of breath.   Cardiovascular: Negative for chest pain.  Musculoskeletal: Negative for back pain.  Neurological: Negative for weakness and numbness.    Allergies  Review of patient's allergies indicates no known allergies.  Home Medications   Current Outpatient Rx  Name Route Sig Dispense Refill  . ALBUTEROL SULFATE HFA 108 (90 BASE) MCG/ACT IN AERS Inhalation Inhale 2 puffs into the lungs every 6 (six) hours as needed. For wheezing      BP 160/111  Pulse 116  Temp 98.6 F (37 C) (Oral)  Resp 18  SpO2 97%  Physical Exam  Nursing note and vitals reviewed. Constitutional: He is oriented to  person, place, and time. He appears well-developed and well-nourished. No distress.  HENT:  Head: Normocephalic.  Neck: Normal range of motion. Neck supple.       No cervical midline tenderness  Cardiovascular: Normal rate and regular rhythm.   No murmur heard. Pulmonary/Chest: Effort normal and breath sounds normal. No respiratory distress. He has no wheezes. He has no rales.  Abdominal: Soft.  Musculoskeletal: He exhibits tenderness. He exhibits no edema.        Reduced range of motion of left shoulder secondary to pain. Normal sensation in hand and fingers. Normal grip strength. Normal radial pulses and cap refill. Tenderness over anterior left shoulder. There is no gross deformity or swelling. Mild pain over the a.c. joint without step off. No significant pain along clavicle or deformity.  Neurological: He is alert and oriented to person, place, and time.  Skin: Skin is warm.  Psychiatric: He has a normal mood and affect.    ED Course  Procedures   Dg Shoulder Left  10/31/2011  *RADIOLOGY REPORT*  Clinical Data: Shoulder pain after lifting injury  LEFT SHOULDER - 2+ VIEW  Comparison: 01/28/2009  Findings:  No fracture or dislocation.  Glenohumeral joint spaces are preserved.  Grossly stable minimal degenerative change of the AC joint.  No evidence of calcific tendonitis.  Regional soft tissues are normal.  Limited visualization of the adjacent thorax is normal.  Linear opacity over the tracheal inlet is favored to be external to the patient.  IMPRESSION: 1.  No acute findings. 2.  Stable mild degenerative change of the left AC joint.   Original Report Authenticated By: Waynard Reeds, M.D.      1. Shoulder sprain   2. Hypertension       MDM  Patient seen and evaluated. Patient no acute distress. X-rays unremarkable without signs of fracture or dislocation. No numbness or weakness in hand. Normal distal sensations and pulses.  Patient placed in a sling for comfort. He has been  instructed to use rice treatment. Will provide orthopedic followup for continued evaluation if symptoms were not improving with conservative measures.  Patient also with hypertension. Patient reports previously being on blood pressure medications but is not taking anything for the past several months to year. At this time we'll start him on low-dose lisinopril.      Angus Seller, [place] 11/01/11 [phone]

## 2011-11-03 NOTE — ED Provider Notes (Signed)
Medical screening examination/treatment/procedure(s) were performed by non-physician practitioner and as supervising physician I was immediately available for consultation/collaboration.  Carlon Chaloux, MD 11/03/11 1115 

## 2012-07-08 ENCOUNTER — Ambulatory Visit (HOSPITAL_COMMUNITY): Admission: RE | Admit: 2012-07-08 | Payer: Self-pay | Source: Home / Self Care | Admitting: Psychiatry

## 2012-09-04 ENCOUNTER — Emergency Department (HOSPITAL_COMMUNITY)
Admission: EM | Admit: 2012-09-04 | Discharge: 2012-09-05 | Disposition: A | Discharge status: Home / Self Care | Payer: Self-pay | Attending: Emergency Medicine | Admitting: Emergency Medicine

## 2012-09-04 ENCOUNTER — Encounter (HOSPITAL_COMMUNITY): Payer: Self-pay | Admitting: Emergency Medicine

## 2012-09-04 DIAGNOSIS — F10959 Alcohol use, unspecified with alcohol-induced psychotic disorder, unspecified: Secondary | ICD-10-CM | POA: Diagnosis present

## 2012-09-04 DIAGNOSIS — F10988 Alcohol use, unspecified with other alcohol-induced disorder: Secondary | ICD-10-CM

## 2012-09-04 DIAGNOSIS — F1020 Alcohol dependence, uncomplicated: Secondary | ICD-10-CM | POA: Diagnosis present

## 2012-09-04 DIAGNOSIS — I10 Essential (primary) hypertension: Secondary | ICD-10-CM | POA: Insufficient documentation

## 2012-09-04 DIAGNOSIS — Z79899 Other long term (current) drug therapy: Secondary | ICD-10-CM | POA: Insufficient documentation

## 2012-09-04 DIAGNOSIS — R440 Auditory hallucinations: Secondary | ICD-10-CM

## 2012-09-04 DIAGNOSIS — J45909 Unspecified asthma, uncomplicated: Secondary | ICD-10-CM | POA: Insufficient documentation

## 2012-09-04 DIAGNOSIS — F17200 Nicotine dependence, unspecified, uncomplicated: Secondary | ICD-10-CM | POA: Insufficient documentation

## 2012-09-04 DIAGNOSIS — R443 Hallucinations, unspecified: Secondary | ICD-10-CM | POA: Insufficient documentation

## 2012-09-04 DIAGNOSIS — F10239 Alcohol dependence with withdrawal, unspecified: Secondary | ICD-10-CM | POA: Insufficient documentation

## 2012-09-04 DIAGNOSIS — F10939 Alcohol use, unspecified with withdrawal, unspecified: Secondary | ICD-10-CM | POA: Insufficient documentation

## 2012-09-04 LAB — CBC WITH DIFFERENTIAL/PLATELET
Basophils Absolute: 0 10*3/uL (ref 0.0–0.1)
Basophils Relative: 1 % (ref 0–1)
Eosinophils Absolute: 0.2 10*3/uL (ref 0.0–0.7)
Hemoglobin: 12.8 g/dL — ABNORMAL LOW (ref 13.0–17.0)
MCHC: 36 g/dL (ref 30.0–36.0)
Monocytes Relative: 14 % — ABNORMAL HIGH (ref 3–12)
Neutro Abs: 3.1 10*3/uL (ref 1.7–7.7)
Neutrophils Relative %: 59 % (ref 43–77)
Platelets: 96 10*3/uL — ABNORMAL LOW (ref 150–400)

## 2012-09-04 LAB — RAPID URINE DRUG SCREEN, HOSP PERFORMED
Amphetamines: NOT DETECTED
Barbiturates: NOT DETECTED
Tetrahydrocannabinol: NOT DETECTED

## 2012-09-04 LAB — COMPREHENSIVE METABOLIC PANEL
ALT: 33 U/L (ref 0–53)
AST: 61 U/L — ABNORMAL HIGH (ref 0–37)
Albumin: 4 g/dL (ref 3.5–5.2)
Alkaline Phosphatase: 67 U/L (ref 39–117)
BUN: 8 mg/dL (ref 6–23)
Chloride: 94 mEq/L — ABNORMAL LOW (ref 96–112)
Potassium: 3.2 mEq/L — ABNORMAL LOW (ref 3.5–5.1)
Sodium: 132 mEq/L — ABNORMAL LOW (ref 135–145)
Total Bilirubin: 0.6 mg/dL (ref 0.3–1.2)
Total Protein: 7.9 g/dL (ref 6.0–8.3)

## 2012-09-04 MED ORDER — ADULT MULTIVITAMIN W/MINERALS CH
1.0000 | ORAL_TABLET | Freq: Every day | ORAL | Status: DC
  Administered 2012-09-04 – 2012-09-05 (×2): 1 via ORAL
  Filled 2012-09-04 (×2): qty 1

## 2012-09-04 MED ORDER — CHLORDIAZEPOXIDE HCL 25 MG PO CAPS: 25.0000 mg | ORAL_CAPSULE | Freq: Three times a day (TID) | ORAL | Status: DC

## 2012-09-04 MED ORDER — CHLORDIAZEPOXIDE HCL 25 MG PO CAPS: 25.0000 mg | ORAL_CAPSULE | Freq: Every day | ORAL | Status: DC

## 2012-09-04 MED ORDER — THIAMINE HCL 100 MG/ML IJ SOLN: 100.0000 mg | Freq: Once | INTRAMUSCULAR | Status: AC

## 2012-09-04 MED ORDER — CHLORDIAZEPOXIDE HCL 25 MG PO CAPS: 25.0000 mg | ORAL_CAPSULE | ORAL | Status: DC

## 2012-09-04 MED ORDER — CHLORDIAZEPOXIDE HCL 25 MG PO CAPS
25.0000 mg | ORAL_CAPSULE | Freq: Four times a day (QID) | ORAL | Status: DC | PRN
  Filled 2012-09-04: qty 1

## 2012-09-04 MED ORDER — POTASSIUM CHLORIDE CRYS ER 20 MEQ PO TBCR
40.0000 meq | EXTENDED_RELEASE_TABLET | Freq: Once | ORAL | Status: AC
  Administered 2012-09-04: 40 meq via ORAL
  Filled 2012-09-04: qty 2

## 2012-09-04 MED ORDER — HYDROXYZINE HCL 25 MG PO TABS: 25.0000 mg | ORAL_TABLET | Freq: Four times a day (QID) | ORAL | Status: DC | PRN

## 2012-09-04 MED ORDER — CHLORDIAZEPOXIDE HCL 25 MG PO CAPS
25.0000 mg | ORAL_CAPSULE | Freq: Once | ORAL | Status: AC
  Filled 2012-09-04: qty 1

## 2012-09-04 MED ORDER — THIAMINE HCL 100 MG/ML IJ SOLN
Freq: Once | INTRAVENOUS | Status: AC
  Administered 2012-09-04: 10:00:00 via INTRAVENOUS
  Filled 2012-09-04: qty 1000

## 2012-09-04 MED ORDER — ACETAMINOPHEN 325 MG PO TABS
650.0000 mg | ORAL_TABLET | Freq: Once | ORAL | Status: AC
  Administered 2012-09-04: 650 mg via ORAL
  Filled 2012-09-04: qty 2

## 2012-09-04 MED ORDER — ONDANSETRON 4 MG PO TBDP: 4.0000 mg | ORAL_TABLET | Freq: Four times a day (QID) | ORAL | Status: DC | PRN

## 2012-09-04 MED ORDER — VITAMIN B-1 100 MG PO TABS
100.0000 mg | ORAL_TABLET | Freq: Every day | ORAL | Status: DC
  Administered 2012-09-04 – 2012-09-05 (×2): 100 mg via ORAL
  Filled 2012-09-04 (×2): qty 1

## 2012-09-04 MED ORDER — CHLORDIAZEPOXIDE HCL 25 MG PO CAPS
25.0000 mg | ORAL_CAPSULE | Freq: Four times a day (QID) | ORAL | Status: DC
  Administered 2012-09-04 – 2012-09-05 (×5): 25 mg via ORAL
  Filled 2012-09-04 (×3): qty 1

## 2012-09-04 MED ORDER — LOPERAMIDE HCL 2 MG PO CAPS: 2.0000 mg | ORAL_CAPSULE | ORAL | Status: DC | PRN

## 2012-09-04 MED ORDER — RISPERIDONE 0.5 MG PO TABS
0.5000 mg | ORAL_TABLET | Freq: Every day | ORAL | Status: DC
  Administered 2012-09-04 – 2012-09-05 (×2): 0.5 mg via ORAL
  Filled 2012-09-04 (×2): qty 1

## 2012-09-04 NOTE — ED Notes (Signed)
Bed: RU04 Expected date:  Expected time:  Means of arrival:  Comments: 42 y/o gen illness/auditory hallucinations/HTN

## 2012-09-04 NOTE — BH Assessment (Signed)
Assessment Note  Daniel [name] is an 42 y.o. male. Pt presents voluntarily with chief complaint of AH after having stopped drinking alcohol 3 days ago 09/01/12. He says he called EMS b/c voice concerned him. Pt says he stopped drinking b/c he was staying at girlfriend's house and she didn't want him to drink. Pt began hearing his uncle's voice (uncle wasn't present) last night and heard uncle's voice while watching TV in hospital room this am. Pt denies other withdrawal symptoms. This is first ever episode of AH for patient. Pt has no hx of substance abuse treatment and no hx of MH treatment. He denies SI and HI.  No delusions noted. Current stressor is his lack of a job. Pt reports euthymic mood and his affect is mood congruent. Pt cooperative and pleasant. Pt has had two DUIs. PT reports drinking approx. 1 pint of alcohol daily with last drink 09/04/12. Per Dr. Lolly Mustache, pt will be kept overnight for observation and d/c tomorrow if medically cleared. Pt will be provided with outpatient resources. Pt indicates he is interested in outpatient referrals, however he is worried outpatient will be too expensive.   Axis I: Alcohol Dependence Axis II: Deferred Axis III:  Past Medical History  Diagnosis Date  . Hypertension   . Asthma    Axis IV: economic problems, occupational problems, other psychosocial or environmental problems and problems related to social environment Axis V: 41-50 serious symptoms  Past Medical History:  Past Medical History  Diagnosis Date  . Hypertension   . Asthma     History reviewed. No pertinent past surgical history.  Family History: History reviewed. No pertinent family history.  Social History:  reports that he has been smoking Cigarettes.  He has been smoking about 0.50 packs per day. He does not have any smokeless tobacco history on file. He reports that  drinks alcohol. He reports that he does not use illicit drugs.  Additional Social History:  Alcohol / Drug  Use Pain Medications: none - pt denies abuse Prescriptions: none - pt denies abuse Over the Counter: none - pt denies abuse History of alcohol / drug use?: Yes Negative Consequences of Use: Financial;Legal;Personal relationships;Work / Mining engineer #1 Name of Substance 1: alcohol 1 - Age of First Use: 19 1 - Amount (size/oz): one pint and some beers 1 - Frequency: daily 1 - Duration: daily for 6 yrs until  1 - Last Use / Amount: 09/01/12 - one pint liquor  CIWA: CIWA-Ar BP: 169/109 mmHg Pulse Rate: 106 COWS:    Allergies: No Known Allergies  Home Medications:  (Not in a hospital admission)  OB/GYN Status:  No LMP for male patient.  General Assessment Data Location of Assessment: [facility] ED Is this a Tele or Face-to-Face Assessment?: Face-to-Face Is this an Initial Assessment or a Re-assessment for this encounter?: Initial Assessment Living Arrangements: Other relatives Can pt return to current living arrangement?: Yes Admission Status: Voluntary Is patient capable of signing voluntary admission?: Yes Transfer from: Home Referral Source: Self/Family/Friend     [facility] Crisis Care Plan Living Arrangements: Other relatives  Education Status Is patient currently in school?: No Highest grade of school patient has completed: 9th + GED  Risk to self Suicidal Ideation: No Suicidal Intent: No Is patient at risk for suicide?: No Suicidal Plan?: No Access to Means: No What has been your use of drugs/alcohol within the last 12 months?: daily alcohol use Previous Attempts/Gestures: No How many times?: 0 Other Self Harm Risks: none Triggers  for Past Attempts:  (n/a) Intentional Self Injurious Behavior: None Family Suicide History: No Recent stressful life event(s): Financial Problems Persecutory voices/beliefs?: No Depression: No Substance abuse history and/or treatment for substance abuse?: Yes Suicide prevention information given to non-admitted patients: Not  applicable  Risk to Others Homicidal Ideation: No Thoughts of Harm to Others: No Current Homicidal Intent: No Current Homicidal Plan: No Access to Homicidal Means: No Identified Victim: none History of harm to others?: No Assessment of Violence: None Noted Violent Behavior Description: pt calm and cooperative Does patient have access to weapons?: No Criminal Charges Pending?: No Does patient have a court date: No  Psychosis Hallucinations: Auditory Delusions: None noted  Mental Status Report Appear/Hygiene: Other (Comment) (appropriate) Eye Contact: Good Motor Activity: Freedom of movement Speech: Logical/coherent;Soft Level of Consciousness: Alert Mood: Other (Comment) (euthymic) Affect: Other (Comment) (euthymic) Anxiety Level: Minimal Thought Processes: Coherent;Relevant Judgement: Unimpaired Orientation: Person;Place;Time;Situation Obsessive Compulsive Thoughts/Behaviors: None  Cognitive Functioning Concentration: Normal Memory: Recent Intact;Remote Intact IQ: Average Insight: Fair Impulse Control: Poor Appetite: Fair Weight Loss: 0 Weight Gain: 0 Sleep: No Change Total Hours of Sleep: 7 Vegetative Symptoms: None  ADLScreening [facility] Assessment Services) Patient's cognitive ability adequate to safely complete daily activities?: Yes Patient able to express need for assistance with ADLs?: Yes Independently performs ADLs?: Yes (appropriate for developmental age)  Prior Inpatient Therapy Prior Inpatient Therapy: No Prior Therapy Dates: na Prior Therapy Facilty/Provider(s): na Reason for Treatment: na  Prior Outpatient Therapy Prior Outpatient Therapy: No Prior Therapy Dates: na Prior Therapy Facilty/Provider(s): na Reason for Treatment: na  ADL Screening (condition at time of admission) Patient's cognitive ability adequate to safely complete daily activities?: Yes Is the patient deaf or have difficulty hearing?: No Does the patient have difficulty  seeing, even when wearing glasses/contacts?: No Does the patient have difficulty concentrating, remembering, or making decisions?: No Patient able to express need for assistance with ADLs?: Yes Does the patient have difficulty dressing or bathing?: No Independently performs ADLs?: Yes (appropriate for developmental age) Weakness of Legs: None Weakness of Arms/Hands: None  Home Assistive Devices/Equipment Home Assistive Devices/Equipment: None    Abuse/Neglect Assessment (Assessment to be complete while patient is alone) Physical Abuse: Denies Verbal Abuse: Denies Sexual Abuse: Denies Exploitation of patient/patient's resources: Denies Self-Neglect: Denies Values / Beliefs Cultural Requests During Hospitalization: None Spiritual Requests During Hospitalization: None   Advance Directives (For Healthcare) Advance Directive: Patient does not have advance directive;Patient would not like information    Additional Information 1:1 In Past 12 Months?: No CIRT Risk: No Elopement Risk: No Does patient have medical clearance?: No     Disposition:  Disposition Initial Assessment Completed for this Encounter: Yes Disposition of Patient: Other dispositions Other disposition(s): Other (Comment) (pt to be observed overnight, then give outpatient resources)  On Site Evaluation by:   Reviewed with Physician:    Donnamarie Rossetti P 09/04/2012 11:29 AM

## 2012-09-04 NOTE — ED Notes (Signed)
C/o hearing voices for starting last noc, no psych meds in over 1 year.  C/o asthma, sp02 of 100%.  Denies suicidal or homicidal  Ideation.  Alert, cooperative and calm.

## 2012-09-04 NOTE — ED Provider Notes (Signed)
CSN: [id number]     Arrival date & time 09/04/12  9562 History     First MD Initiated Contact with Patient 09/04/12 0719     Chief Complaint  Patient presents with  . Medical Clearance  . Hypertension   (Consider location/radiation/quality/duration/timing/severity/associated sxs/prior Treatment) Patient is a 42 y.o. male presenting with hypertension.  Hypertension   Pt with history of HTN and asthma who has been a heavy drinker for the last 3 years since getting our of prison reports he stopped drinking 3 days ago, was doing well until last night when he says he was hearing voices. No visual hallucinations, confusion, seizures, tremor. He called EMS and reports hearing voices en route but have stopped since arrival. Denies any SI/HI. He has never had hallucinations before. Previously treated for HTN but has not taken any meds since he left prison.   Past Medical History  Diagnosis Date  . Hypertension   . Asthma    History reviewed. No pertinent past surgical history. History reviewed. No pertinent family history. History  Substance Use Topics  . Smoking status: Current Every Day Smoker -- 0.50 packs/day    Types: Cigarettes  . Smokeless tobacco: Not on file  . Alcohol Use: Yes     Comment: occasion    Review of Systems All other systems reviewed and are negative except as noted in HPI.    Allergies  Review of patient's allergies indicates no known allergies.  Home Medications   Current Outpatient Rx  Name  Route  Sig  Dispense  Refill  . albuterol (PROVENTIL HFA;VENTOLIN HFA) 108 (90 BASE) MCG/ACT inhaler   Inhalation   Inhale 2 puffs into the lungs every 6 (six) hours as needed. For wheezing         . albuterol (PROVENTIL HFA;VENTOLIN HFA) 108 (90 BASE) MCG/ACT inhaler   Inhalation   Inhale 2 puffs into the lungs every 2 (two) hours as needed for wheezing or shortness of breath (cough).   1 Inhaler   0   . ibuprofen (ADVIL,MOTRIN) 800 MG tablet   Oral  Take 1 tablet (800 mg total) by mouth every 8 (eight) hours as needed for pain.   30 tablet   0   . lisinopril (PRINIVIL,ZESTRIL) 20 MG tablet   Oral   Take 0.5 tablets (10 mg total) by mouth daily.   30 tablet   0    BP 169/109  Pulse 106  Temp(Src) 98.7 F (37.1 C) (Oral)  Resp 17  SpO2 98% Physical Exam  Nursing note and vitals reviewed. Constitutional: He is oriented to person, place, and time. He appears well-developed and well-nourished.  HENT:  Head: Normocephalic and atraumatic.  Eyes: EOM are normal. Pupils are equal, round, and reactive to light.  Neck: Normal range of motion. Neck supple.  Cardiovascular: Normal rate, normal heart sounds and intact distal pulses.   Pulmonary/Chest: Effort normal and breath sounds normal.  Abdominal: Bowel sounds are normal. He exhibits no distension. There is no tenderness.  Musculoskeletal: Normal range of motion. He exhibits no edema and no tenderness.  Neurological: He is alert and oriented to person, place, and time. He has normal strength. No cranial nerve deficit or sensory deficit. Coordination normal.  Skin: Skin is warm and dry. No rash noted.  Psychiatric: He has a normal mood and affect. His behavior is normal. Judgment and thought content normal.    ED Course   Procedures (including critical care time)  Labs Reviewed  CBC WITH DIFFERENTIAL -  Abnormal; Notable for the following:    RBC 3.81 (*)    Hemoglobin 12.8 (*)    HCT 35.6 (*)    Platelets 96 (*)    Monocytes Relative 14 (*)    All other components within normal limits  COMPREHENSIVE METABOLIC PANEL - Abnormal; Notable for the following:    Sodium 132 (*)    Potassium 3.2 (*)    Chloride 94 (*)    Glucose, Bld 118 (*)    AST 61 (*)    All other components within normal limits  URINE RAPID DRUG SCREEN (HOSP PERFORMED)  ETHANOL   No results found.  1. Alcohol withdrawal   2. Auditory hallucination   3. HTN (hypertension)     MDM  Pt mildly  hypertensive, no current hallucinations or signs of significant EtOH withdrawal. Check labs for signs of end organ damage and for medical clearance.   Pt reports auditory hallucinations returned. Spoke with Psych NP who will evaluate the patient in the ED. Banana bag and K ordered.   10:20 AM Pt seen by Dr. Lolly Mustache who would like to trial the patient on Librium Protocol in the Psych ED for 24hrs before deciding to admit him. Psych team will write protocol orders.   Frandy Basnett B. Bernette Mayers, MD 09/04/12 1021

## 2012-09-04 NOTE — ED Notes (Signed)
Per EMS: Pt from home.  Pt states he has drank heavily for years.  Has not had a drink in 3 days.  Does not have a drink of choice.  Stopped drinking on his own 3 days ago.  C/o auditory hallucinations since yesterday.  Told EMS he was hearing things in the ambulance.  Denies SI/HI.  States he needs help stopping drinking.  Has hx of HTN.  Has not taken htn meds in an unknown amount of time due to finances.  States he is shaky/weak.

## 2012-09-04 NOTE — Consult Note (Signed)
Reason for Consult:Evaluation for inpatient treatment Referring Physician: EDP  Daniel [name] is an 42 y.o. male.  HPI:  Patient present to [facility] with complaints of alcohol withdrawal and hallucinations.  Patient states that this is the first time he has heard voices.  States that he has detox with out assistance before in the past but since he was hearing the voice of his uncle he thought he should come in.  Patient states that hearing the voices started 2 days ago and it is off and on.  Patient states during this interview that he hears his uncle talking now.  Patient states that the last time he was sober was two months ago and he did not have any hallucinations then.  States that he is drinking everyday about a fifth liquor a day with friends and 6-12 pack of beer also at time.  Patient denies the use of illicit drugs.  Patient states that he has had trouble sleeping since he quit drinking.  Patient lives house to house with girlfriend, uncle, and daughter. Patient denies suicidal ideation, homicidal ideation, and paranoia.    Past Medical History  Diagnosis Date  . Hypertension   . Asthma     History reviewed. No pertinent past surgical history.  History reviewed. No pertinent family history.  Social History:  reports that he has been smoking Cigarettes.  He has been smoking about 0.50 packs per day. He does not have any smokeless tobacco history on file. He reports that  drinks alcohol. He reports that he does not use illicit drugs.  Allergies: No Known Allergies  Medications: I have reviewed the patient's current medications.  Results for orders placed during the hospital encounter of 09/04/12 (from the past 48 hour(s))  CBC WITH DIFFERENTIAL     Status: Abnormal   Collection Time    09/04/12  8:05 AM      Result Value Range   WBC 5.3  4.0 - 10.5 K/uL   RBC 3.81 (*) 4.22 - 5.81 MIL/uL   Hemoglobin 12.8 (*) 13.0 - 17.0 g/dL   HCT 60.4 (*) 54.0 - 98.1 %   MCV 93.4  78.0 - 100.0  fL   MCH 33.6  26.0 - 34.0 pg   MCHC 36.0  30.0 - 36.0 g/dL   RDW 19.1  47.8 - 29.5 %   Platelets 96 (*) 150 - 400 K/uL   Comment: SPECIMEN CHECKED FOR CLOTS     REPEATED TO VERIFY     PLATELET COUNT CONFIRMED BY SMEAR   Neutrophils Relative % 59  43 - 77 %   Neutro Abs 3.1  1.7 - 7.7 K/uL   Lymphocytes Relative 23  12 - 46 %   Lymphs Abs 1.2  0.7 - 4.0 K/uL   Monocytes Relative 14 (*) 3 - 12 %   Monocytes Absolute 0.8  0.1 - 1.0 K/uL   Eosinophils Relative 3  0 - 5 %   Eosinophils Absolute 0.2  0.0 - 0.7 K/uL   Basophils Relative 1  0 - 1 %   Basophils Absolute 0.0  0.0 - 0.1 K/uL  COMPREHENSIVE METABOLIC PANEL     Status: Abnormal   Collection Time    09/04/12  8:05 AM      Result Value Range   Sodium 132 (*) 135 - 145 mEq/L   Potassium 3.2 (*) 3.5 - 5.1 mEq/L   Chloride 94 (*) 96 - 112 mEq/L   CO2 27  19 - 32 mEq/L  Glucose, Bld 118 (*) 70 - 99 mg/dL   BUN 8  6 - 23 mg/dL   Creatinine, Ser 1.61  0.50 - 1.35 mg/dL   Calcium 09.6  8.4 - 04.5 mg/dL   Total Protein 7.9  6.0 - 8.3 g/dL   Albumin 4.0  3.5 - 5.2 g/dL   AST 61 (*) 0 - 37 U/L   ALT 33  0 - 53 U/L   Alkaline Phosphatase 67  39 - 117 U/L   Total Bilirubin 0.6  0.3 - 1.2 mg/dL   GFR calc non Af Amer >90  >90 mL/min   GFR calc Af Amer >90  >90 mL/min   Comment:            The eGFR has been calculated     using the CKD EPI equation.     This calculation has not been     validated in all clinical     situations.     eGFR's persistently     <90 mL/min signify     possible Chronic Kidney Disease.  ETHANOL     Status: None   Collection Time    09/04/12  8:05 AM      Result Value Range   Alcohol, Ethyl (B) <11  0 - 11 mg/dL   Comment:            LOWEST DETECTABLE LIMIT FOR     SERUM ALCOHOL IS 11 mg/dL     FOR MEDICAL PURPOSES ONLY  URINE RAPID DRUG SCREEN (HOSP PERFORMED)     Status: None   Collection Time    09/04/12  8:10 AM      Result Value Range   Opiates NONE DETECTED  NONE DETECTED   Cocaine NONE  DETECTED  NONE DETECTED   Benzodiazepines NONE DETECTED  NONE DETECTED   Amphetamines NONE DETECTED  NONE DETECTED   Tetrahydrocannabinol NONE DETECTED  NONE DETECTED   Barbiturates NONE DETECTED  NONE DETECTED   Comment:            DRUG SCREEN FOR MEDICAL PURPOSES     ONLY.  IF CONFIRMATION IS NEEDED     FOR ANY PURPOSE, NOTIFY LAB     WITHIN 5 DAYS.                LOWEST DETECTABLE LIMITS     FOR URINE DRUG SCREEN     Drug Class       Cutoff (ng/mL)     Amphetamine      1000     Barbiturate      200     Benzodiazepine   200     Tricyclics       300     Opiates          300     Cocaine          300     THC              50    No results found.  Review of Systems  Gastrointestinal: Negative for nausea, vomiting, abdominal pain and diarrhea.  Neurological: Positive for tremors (slight).  Psychiatric/Behavioral: Positive for hallucinations (Patient states that he is hearing his uncles voice on and off.  Just talking to hime no negative commands) and substance abuse. Negative for depression, suicidal ideas and memory loss. The patient is not nervous/anxious and does not have insomnia.    Blood pressure 169/109, pulse 106, temperature 98.7 F (37.1 C),  temperature source Oral, resp. rate 17, SpO2 98.00%. Physical Exam  Constitutional: He is oriented to person, place, and time.  Neck: Normal range of motion.  Respiratory: Effort normal.  Musculoskeletal: Normal range of motion.  Neurological: He is alert and oriented to person, place, and time.  Skin: Skin is warm and dry.  Psychiatric: His mood appears not anxious. His affect is not angry. He is actively hallucinating (Hearing uncles voice). He is not agitated. Thought content is not paranoid and not delusional. Cognition and memory are normal. He does not exhibit a depressed mood. He expresses no homicidal and no suicidal ideation.    Assessment/Plan: Axis I: Alcohol induced psychosis Axis II: Deferred Axis III:  Past  Medical History  Diagnosis Date  . Hypertension   . Asthma    Axis IV: housing problems, other psychosocial or environmental problems and problems related to social environment Axis V: 51-60 moderate symptoms  Recommendation/disposition:  Start Librium protocol and Risperdal 0.5 mg daily.  Monitor over night; Reassess in the morning.  Discharge in the morning if no complication.  Give rehab and outpatient resources.    Rankin, Shuvon, FNP-BC 09/04/2012, 2:27 PM   I have personally seen the patient and agreed with the findings and involved in the treatment plan. Kathryne Sharper, MD

## 2012-09-04 NOTE — Progress Notes (Signed)
[facility] provided patient with a [employer] of the [place]. Also, provided patient with a list of primary care resources. Patient was not receptive to the informatio.
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
## 2012-09-05 DIAGNOSIS — F10959 Alcohol use, unspecified with alcohol-induced psychotic disorder, unspecified: Secondary | ICD-10-CM | POA: Diagnosis present

## 2012-09-05 DIAGNOSIS — F1020 Alcohol dependence, uncomplicated: Secondary | ICD-10-CM | POA: Diagnosis present

## 2012-09-05 MED ORDER — LISINOPRIL 30 MG PO TABS: 30.0000 mg | ORAL_TABLET | Freq: Every day | ORAL | Status: DC

## 2012-09-05 NOTE — Progress Notes (Signed)
Follow up Progress Note: Face to face interview and consult with Dr. Karna Dupes T [id number]  Subjective:  Patient states that he is feeling much better this morning.  Patient is denying auditory hallucinations this morning and states that he is ready to go.  Patient also denies suicidal ideations, homicidal ideations, psychosis, and paranoia.  Patient states that he attends AA meeting on [address] in [place].  Patient also states that he is interested in long term rehab services if available.  Patient informed that [facility] may have a bed this evening or tomorrow.  Patient states that he would like to remain in [place].  Informed patient that we would give him the contact information incase he changed his mind.  Patient states "I feel like I can do it this time.  You know I stopped on my own; I just came here to help with withdrawal and since I was hearing voice this time.  I think I can do it."   Current Medication Current facility-administered medications:chlordiazePOXIDE (LIBRIUM) capsule 25 mg, 25 mg, Oral, Q6H PRN, Shuvon Rankin, NP;  chlordiazePOXIDE (LIBRIUM) capsule 25 mg, 25 mg, Oral, QID, Shuvon Rankin, NP, 25 mg at 09/04/12 2156;  chlordiazePOXIDE (LIBRIUM) capsule 25 mg, 25 mg, Oral, TID, Shuvon Rankin, NP;  [START ON 09/06/2012] chlordiazePOXIDE (LIBRIUM) capsule 25 mg, 25 mg, Oral, BH-qamhs, Shuvon Rankin, NP [START ON 09/07/2012] chlordiazePOXIDE (LIBRIUM) capsule 25 mg, 25 mg, Oral, Daily, Shuvon Rankin, NP;  hydrOXYzine (ATARAX/VISTARIL) tablet 25 mg, 25 mg, Oral, Q6H PRN, Shuvon Rankin, NP;  loperamide (IMODIUM) capsule 2-4 mg, 2-4 mg, Oral, PRN, Shuvon Rankin, NP;  multivitamin with minerals tablet 1 tablet, 1 tablet, Oral, Daily, Shuvon Rankin, NP, 1 tablet at 09/04/12 1106 ondansetron (ZOFRAN-ODT) disintegrating tablet 4 mg, 4 mg, Oral, Q6H PRN, Shuvon Rankin, NP;  risperiDONE (RISPERDAL) tablet 0.5 mg, 0.5 mg, Oral, Daily, Shuvon Rankin, NP, 0.5 mg at 09/04/12 1106;   thiamine (VITAMIN B-1) tablet 100 mg, 100 mg, Oral, Daily, Shuvon Rankin, NP, 100 mg at 09/04/12 1106 No current outpatient prescriptions on file..  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 Hypertension   . Asthma    Axis IV: housing problems, occupational problems, other psychosocial or environmental problems, problems related to social environment and problems with primary support group Axis V: No symptoms noted at this time    Plan  Recommendation/Disposition:  Discharge home with rehab resource information for long term treatment EDP to address hypertension prior to discharge  Shuvon Rankin, FNP-BC

## 2012-09-05 NOTE — Progress Notes (Signed)
Reviewed and agreed with 

## 2012-09-05 NOTE — ED Notes (Signed)
Pt denies SI/HI.  Pt states that earlier he was having VH but denies them at this time.  Pt forwards little.  Pt states, "I just want some rest."  Pt seeking detox from [facility].

## 2012-09-05 NOTE — ED Provider Notes (Signed)
Pt observed overnight and doing well this morning. Psychiatrically cleared. Patient has been persistently hypertensive. Initial tachycardia has improved. His high blood pressure has not though. Patient with a previous diagnosis of hypertension. On lisinopril. Question compliance in an alcoholic. His renal function is normal. Is currently on lisinopril. Will increase this dosage. Outpatient followup for further adjustments as needed and for continuing management.   Raeford Razor, MD 09/05/12 [phone]

## 2012-09-05 NOTE — ED Notes (Signed)
Pt is awake and alert, pleasant and cooperative. Patient denies HI, SI AH or VH. Discharge vitals 128/90 HR 97 RR 16 and unlabored. Pt has outpatient treatment scheduled and follow up with PCP for hypertension treatment. Will continue to monitor for safety. Patient escorted to lobby without incident. T.Melvyn Neth RN

## 2012-12-18 ENCOUNTER — Emergency Department (HOSPITAL_COMMUNITY)
Admission: EM | Admit: 2012-12-18 | Discharge: 2012-12-18 | Disposition: A | Discharge status: Home / Self Care | Payer: Self-pay | Attending: Emergency Medicine | Admitting: Emergency Medicine

## 2012-12-18 ENCOUNTER — Emergency Department (HOSPITAL_COMMUNITY): Payer: Self-pay

## 2012-12-18 ENCOUNTER — Encounter (HOSPITAL_COMMUNITY): Payer: Self-pay | Admitting: Emergency Medicine

## 2012-12-18 DIAGNOSIS — W109XXA Fall (on) (from) unspecified stairs and steps, initial encounter: Secondary | ICD-10-CM

## 2012-12-18 DIAGNOSIS — S0003XA Contusion of scalp, initial encounter: Secondary | ICD-10-CM | POA: Insufficient documentation

## 2012-12-18 DIAGNOSIS — F1010 Alcohol abuse, uncomplicated: Secondary | ICD-10-CM | POA: Insufficient documentation

## 2012-12-18 DIAGNOSIS — Z79899 Other long term (current) drug therapy: Secondary | ICD-10-CM | POA: Insufficient documentation

## 2012-12-18 DIAGNOSIS — I10 Essential (primary) hypertension: Secondary | ICD-10-CM | POA: Insufficient documentation

## 2012-12-18 DIAGNOSIS — Y929 Unspecified place or not applicable: Secondary | ICD-10-CM | POA: Insufficient documentation

## 2012-12-18 DIAGNOSIS — F17200 Nicotine dependence, unspecified, uncomplicated: Secondary | ICD-10-CM | POA: Insufficient documentation

## 2012-12-18 DIAGNOSIS — J45909 Unspecified asthma, uncomplicated: Secondary | ICD-10-CM | POA: Insufficient documentation

## 2012-12-18 DIAGNOSIS — W108XXA Fall (on) (from) other stairs and steps, initial encounter: Secondary | ICD-10-CM | POA: Insufficient documentation

## 2012-12-18 DIAGNOSIS — S0100XA Unspecified open wound of scalp, initial encounter: Secondary | ICD-10-CM | POA: Insufficient documentation

## 2012-12-18 DIAGNOSIS — Y939 Activity, unspecified: Secondary | ICD-10-CM | POA: Insufficient documentation

## 2012-12-18 DIAGNOSIS — S0101XA Laceration without foreign body of scalp, initial encounter: Secondary | ICD-10-CM

## 2012-12-18 DIAGNOSIS — Z23 Encounter for immunization: Secondary | ICD-10-CM | POA: Insufficient documentation

## 2012-12-18 MED ORDER — BACITRACIN ZINC 500 UNIT/GM EX OINT
TOPICAL_OINTMENT | CUTANEOUS | Status: AC
  Administered 2012-12-18: 18:00:00
  Filled 2012-12-18: qty 0.9

## 2012-12-18 MED ORDER — TETANUS-DIPHTH-ACELL PERTUSSIS 5-2.5-18.5 LF-MCG/0.5 IM SUSP
0.5000 mL | Freq: Once | INTRAMUSCULAR | Status: AC
  Administered 2012-12-18: 0.5 mL via INTRAMUSCULAR
  Filled 2012-12-18: qty 0.5

## 2012-12-18 NOTE — ED Provider Notes (Signed)
Medical screening examination/treatment/procedure(s) were performed by non-physician practitioner and as supervising physician I was immediately available for consultation/collaboration.  EKG Interpretation   None        Ethelda Chick, MD 12/18/12 1806
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
## 2012-12-18 NOTE — ED Provider Notes (Signed)
CSN: [id number]     Arrival date & time 12/18/12  1624 History   First MD Initiated Contact with Patient 12/18/12 1629     Chief Complaint  Patient presents with  . Fall  . Head Laceration   (Consider location/radiation/quality/duration/timing/severity/associated sxs/prior Treatment) Patient is a 42 y.o. male presenting with fall and scalp laceration. The history is provided by the patient, medical records and the EMS personnel. No language interpreter was used.  Fall This is a new problem. The current episode started today. The problem occurs intermittently. The problem has been unchanged. Pertinent negatives include no abdominal pain, arthralgias, chest pain, chills, coughing, fever, headaches, joint swelling, nausea, neck pain, numbness, rash, vomiting or weakness. Exacerbated by: palpation. He has tried nothing for the symptoms.  Head Laceration Pertinent negatives include no abdominal pain, arthralgias, chest pain, chills, coughing, fever, headaches, joint swelling, nausea, neck pain, numbness, rash, vomiting or weakness.   Daniel [name] is a 42 y.o. male  with a hx of HTN (uncontrolled), asthma, EtOH abuse presents to the Emergency Department complaining of acute, persistent, unchanged laceration to the right occiput onset just PTA.  Patient reports he fell down 2 stairs hitting the back of his head on the concrete. He is an unknown loss of consciousness. He reports he has been drinking alcohol today, a lot of it. He states this is normal for him on a daily basis. Reports a history of uncontrolled hypertension. He denies fever, chills, headache, neck pain, back pain, chest pain, shortness of breath, nausea, vomiting, weakness, dizziness.  Palpation makes his pain worse and nothing makes it better.  Past Medical History  Diagnosis Date  . Hypertension   . Asthma    History reviewed. No pertinent past surgical history. No family history on file. History  Substance Use Topics  .  Smoking status: Current Every Day Smoker -- 0.50 packs/day    Types: Cigarettes  . Smokeless tobacco: Not on file  . Alcohol Use: Yes     Comment: occasion    Review of Systems  Constitutional: Negative for fever and chills.  HENT: Negative for dental problem, facial swelling and nosebleeds.   Eyes: Negative for visual disturbance.  Respiratory: Negative for cough, chest tightness, shortness of breath, wheezing and stridor.   Cardiovascular: Negative for chest pain.  Gastrointestinal: Negative for nausea, vomiting and abdominal pain.  Genitourinary: Negative for dysuria and flank pain.  Musculoskeletal: Negative for arthralgias, back pain, gait problem, joint swelling, neck pain and neck stiffness.  Skin: Positive for wound. Negative for rash.  Allergic/Immunologic: Negative for immunocompromised state.  Neurological: Negative for syncope, weakness, light-headedness, numbness and headaches.  Hematological: Does not bruise/bleed easily.  Psychiatric/Behavioral: The patient is not nervous/anxious.     Allergies  Review of patient's allergies indicates no known allergies.  Home Medications   Current Outpatient Rx  Name  Route  Sig  Dispense  Refill  . albuterol (PROVENTIL HFA;VENTOLIN HFA) 108 (90 BASE) MCG/ACT inhaler   Inhalation   Inhale 2 puffs into the lungs 2 (two) times daily as needed for wheezing or shortness of breath.         . lisinopril (PRINIVIL,ZESTRIL) 30 MG tablet   Oral   Take 1 tablet (30 mg total) by mouth daily.   30 tablet   0    BP 149/104  Pulse 83  Temp(Src) 98.6 F (37 C) (Oral)  Resp 20  SpO2 93% Physical Exam  Nursing note and vitals reviewed. Constitutional: He is oriented  to person, place, and time. He appears well-developed and well-nourished. No distress.  Awake, alert, nontoxic appearance  HENT:  Head: Normocephalic. Head is with abrasion, with contusion and with laceration.    Right Ear: Tympanic membrane, external ear and ear  canal normal.  Left Ear: Tympanic membrane, external ear and ear canal normal.  Nose: Nose normal. No mucosal edema.  Mouth/Throat: Uvula is midline, oropharynx is clear and moist and mucous membranes are normal. Mucous membranes are not dry. No uvula swelling. No oropharyngeal exudate, posterior oropharyngeal edema, posterior oropharyngeal erythema or tonsillar abscesses.  2cm laceration to the right occiput with surrounding abrasion Contusion and hematoma noted to the site  Eyes: Conjunctivae and EOM are normal. Pupils are equal, round, and reactive to light. No scleral icterus.  Neck: Normal range of motion and full passive range of motion without pain. Neck supple. No spinous process tenderness and no muscular tenderness present. No rigidity. Normal range of motion present.  Full range of motion without pain No midline or paraspinal tenderness  Cardiovascular: Normal rate, regular rhythm, normal heart sounds and intact distal pulses.   No murmur heard. Pulses:      Radial pulses are 2+ on the right side, and 2+ on the left side.       Dorsalis pedis pulses are 2+ on the right side, and 2+ on the left side.       Posterior tibial pulses are 2+ on the right side, and 2+ on the left side.  Pulmonary/Chest: Effort normal and breath sounds normal. No accessory muscle usage. No respiratory distress. He has no decreased breath sounds. He has no wheezes. He has no rhonchi. He has no rales. He exhibits no tenderness and no bony tenderness.  No contusion noted  Abdominal: Soft. Normal appearance and bowel sounds are normal. He exhibits no distension and no mass. There is no tenderness. There is no rigidity, no rebound, no guarding and no CVA tenderness.  No ecchymosis  Musculoskeletal: Normal range of motion. He exhibits no edema.       Thoracic back: He exhibits normal range of motion.       Lumbar back: He exhibits normal range of motion.  Full range of motion of the T-spine and L-spine No  tenderness to palpation of the spinous processes of the T-spine or L-spine No tenderness to palpation of the paraspinous muscles of the L-spine  Lymphadenopathy:    He has no cervical adenopathy.  Neurological: He is alert and oriented to person, place, and time. No cranial nerve deficit. He exhibits normal muscle tone. Coordination normal. GCS eye subscore is 4. GCS verbal subscore is 5. GCS motor subscore is 6.  Reflex Scores:      Tricep reflexes are 2+ on the right side and 2+ on the left side.      Bicep reflexes are 2+ on the right side and 2+ on the left side.      Brachioradialis reflexes are 2+ on the right side and 2+ on the left side.      Patellar reflexes are 2+ on the right side and 2+ on the left side.      Achilles reflexes are 2+ on the right side and 2+ on the left side. Speech is clear and goal oriented, follows commands Major Cranial nerves without deficit, no facial droop Normal strength in upper and lower extremities bilaterally including dorsiflexion and plantar flexion, strong and equal grip strength Sensation normal to light and sharp touch Moves extremities  without ataxia, coordination intact Normal finger to nose  Normal gait and balance  Skin: Skin is warm and dry. No rash noted. He is not diaphoretic. No erythema.  Psychiatric: He has a normal mood and affect. His behavior is normal.    ED Course  LACERATION REPAIR Date/Time: 12/18/2012 4:50 PM Performed by: Dierdre Forth Authorized by: Dierdre Forth Consent: Verbal consent obtained. Risks and benefits: risks, benefits and alternatives were discussed Consent given by: patient and parent Patient understanding: patient states understanding of the procedure being performed Patient consent: the patient's understanding of the procedure matches consent given Procedure consent: procedure consent matches procedure scheduled Relevant documents: relevant documents present and verified Site marked:  the operative site was marked Imaging studies: imaging studies not available Required items: required blood products, implants, devices, and special equipment available Patient identity confirmed: verbally with patient and arm band Time out: Immediately prior to procedure a "time out" was called to verify the correct patient, procedure, equipment, support staff and site/side marked as required. Body area: head/neck Location details: scalp Laceration length: 2 cm Foreign bodies: no foreign bodies Tendon involvement: none Nerve involvement: none Vascular damage: no Patient sedated: no Preparation: Patient was prepped and draped in the usual sterile fashion. Irrigation solution: saline Irrigation method: syringe Amount of cleaning: standard Skin closure: staples Number of sutures: 2 Technique: simple Approximation: close Approximation difficulty: simple Dressing: 4x4 sterile gauze and antibiotic ointment Patient tolerance: Patient tolerated the procedure well with no immediate complications.   (including critical care time) Labs Review Labs Reviewed - No data to display Imaging Review Ct Head Wo Contrast  12/18/2012   CLINICAL DATA:  Fall, head laceration  EXAM: CT HEAD WITHOUT CONTRAST  CT CERVICAL SPINE WITHOUT CONTRAST  TECHNIQUE: Multidetector CT imaging of the head and cervical spine was performed following the standard protocol without intravenous contrast. Multiplanar CT image reconstructions of the cervical spine were also generated.  COMPARISON:  09/15/2011  FINDINGS: CT HEAD FINDINGS  No skull fracture is noted. There is scalp swelling and small subcutaneous hematoma in right posterior parietal region. Skin staples are noted in this region.  No intracranial hemorrhage, mass effect or midline shift. No acute infarction. No mass lesion is noted on this unenhanced scan.  CT CERVICAL SPINE FINDINGS  Comparison exam 11/26/2009  Axial images of the cervical spine shows no acute  fracture or subluxation. Large anterior osteophytes are noted C3-C4 and C5 vertebral body. Mild disc space flattening at C7-T1 level. Bridging anterior osteophytes noted at C7-T1 level. Spinal canal is patent. Cervical airway is patent. No prevertebral soft tissue swelling.  There is no pneumothorax in visualized lung apices.  IMPRESSION: 1. No acute intracranial abnormality. There is scalp swelling and small subcutaneous hematoma right posterior parietal region. 2. No cervical spine acute fracture or subluxation. Multilevel degenerative changes as described above.   Electronically Signed   By: Natasha Mead M.D.   On: 12/18/2012 17:24   Ct Cervical Spine Wo Contrast  12/18/2012   CLINICAL DATA:  Fall, head laceration  EXAM: CT HEAD WITHOUT CONTRAST  CT CERVICAL SPINE WITHOUT CONTRAST  TECHNIQUE: Multidetector CT imaging of the head and cervical spine was performed following the standard protocol without intravenous contrast. Multiplanar CT image reconstructions of the cervical spine were also generated.  COMPARISON:  09/15/2011  FINDINGS: CT HEAD FINDINGS  No skull fracture is noted. There is scalp swelling and small subcutaneous hematoma in right posterior parietal region. Skin staples are noted in this region.  No  intracranial hemorrhage, mass effect or midline shift. No acute infarction. No mass lesion is noted on this unenhanced scan.  CT CERVICAL SPINE FINDINGS  Comparison exam 11/26/2009  Axial images of the cervical spine shows no acute fracture or subluxation. Large anterior osteophytes are noted C3-C4 and C5 vertebral body. Mild disc space flattening at C7-T1 level. Bridging anterior osteophytes noted at C7-T1 level. Spinal canal is patent. Cervical airway is patent. No prevertebral soft tissue swelling.  There is no pneumothorax in visualized lung apices.  IMPRESSION: 1. No acute intracranial abnormality. There is scalp swelling and small subcutaneous hematoma right posterior parietal region. 2. No  cervical spine acute fracture or subluxation. Multilevel degenerative changes as described above.   Electronically Signed   By: Natasha Mead M.D.   On: 12/18/2012 17:24    EKG Interpretation   None       MDM   1. Fall on stairs, initial encounter   2. Scalp laceration, initial encounter   3. Contusion of scalp, initial encounter      [employer] presents with small laceration to the scalp after fall.  Pt intoxicated and not reliable; unknown LOC.  Pt ambulatory in the ED without difficulty, A&Ox3 and without neck or back pain.  Denies use of anticoagulants.  No neurologic deficit on exam.  Will CT head and neck.  5:46 PM CT head/neck without acute abnormality; large contusion to right occiput; no skull fracture.  Laceration repaired with staples and abx ointment placed on abrasion.  Tdap booster given. Pressure irrigation performed. Laceration occurred < 8 hours prior to repair which was well tolerated. Pt has no co morbidities to effect normal wound healing.   Patient noted to be hypertensive in the emergency department.  No signs of hypertensive urgency.  Discussed with patient the need for close follow-up and management by their primary care physician; resources given to find a PCP.    Discussed suture home care w pt and answered questions. Pt to f-u for wound check and suture removal in 5-7 days. Pt is hemodynamically stable and neurovascularly intact without no complaints prior to dc.    It has been determined that no acute conditions requiring further emergency intervention are present at this time. The patient/guardian have been advised of the diagnosis and plan. We have discussed signs and symptoms that warrant return to the ED, such as changes or worsening in symptoms.   Vital signs are stable at discharge.   BP 149/104  Pulse 83  Temp(Src) 98.6 F (37 C) (Oral)  Resp 20  SpO2 93%  Patient/guardian has voiced understanding and agreed to follow-up with the PCP or  specialist.        Dierdre Forth, PA-C 12/18/12 1748

## 2012-12-27 ENCOUNTER — Emergency Department (INDEPENDENT_AMBULATORY_CARE_PROVIDER_SITE_OTHER)
Admission: EM | Admit: 2012-12-27 | Discharge: 2012-12-27 | Disposition: A | Discharge status: Home / Self Care | Payer: Self-pay | Source: Home / Self Care | Attending: Emergency Medicine | Admitting: Emergency Medicine

## 2012-12-27 ENCOUNTER — Encounter (HOSPITAL_COMMUNITY): Payer: Self-pay | Admitting: Emergency Medicine

## 2012-12-27 DIAGNOSIS — Z4802 Encounter for removal of sutures: Secondary | ICD-10-CM

## 2012-12-27 NOTE — ED Notes (Signed)
Staples removed from scalp.

## 2012-12-27 NOTE — ED Provider Notes (Signed)
Medical screening examination/treatment/procedure(s) were performed by non-physician practitioner and as supervising physician I was immediately available for consultation/collaboration.  Leslee Home, M.D.  Reuben Likes, MD 12/27/12 2225

## 2012-12-27 NOTE — ED Provider Notes (Signed)
CSN: [id number]     Arrival date & time 12/27/12  1803 History   First MD Initiated Contact with Patient 12/27/12 1859     No chief complaint on file.  (Consider location/radiation/quality/duration/timing/severity/associated sxs/prior Treatment) Patient is a 42 y.o. male presenting with suture removal. The history is provided by the patient. No language interpreter was used.  Suture / Staple Removal This is a new problem. The problem has not changed since onset.Nothing aggravates the symptoms. Nothing relieves the symptoms. He has tried nothing for the symptoms.   Pt is here for staple removal Past Medical History  Diagnosis Date  . Hypertension   . Asthma    No past surgical history on file. No family history on file. History  Substance Use Topics  . Smoking status: Current Every Day Smoker -- 0.50 packs/day    Types: Cigarettes  . Smokeless tobacco: Not on file  . Alcohol Use: Yes     Comment: occasion    Review of Systems  All other systems reviewed and are negative.    Allergies  Review of patient's allergies indicates no known allergies.  Home Medications   Current Outpatient Rx  Name  Route  Sig  Dispense  Refill  . albuterol (PROVENTIL HFA;VENTOLIN HFA) 108 (90 BASE) MCG/ACT inhaler   Inhalation   Inhale 2 puffs into the lungs 2 (two) times daily as needed for wheezing or shortness of breath.         . lisinopril (PRINIVIL,ZESTRIL) 30 MG tablet   Oral   Take 1 tablet (30 mg total) by mouth daily.   30 tablet   0    BP 153/98  Pulse 90  Temp(Src) 98 F (36.7 C) (Oral)  Resp 16  SpO2 99% Physical Exam  Nursing note and vitals reviewed. Constitutional: He is oriented to person, place, and time.  Musculoskeletal:  2 staples scalp,  Healed laceration  Neurological: He is alert and oriented to person, place, and time. He has normal reflexes.  Skin: Skin is warm.  Psychiatric: He has a normal mood and affect.    ED Course  Procedures (including  critical care time) Labs Review Labs Reviewed - No data to display Imaging Review No results found.  EKG Interpretation    Date/Time:    Ventricular Rate:    PR Interval:    QRS Duration:   QT Interval:    QTC Calculation:   R Axis:     Text Interpretation:              MDM   1. Encounter for staple removal        Elson Areas, [place] 12/27/12 1912

## 2013-08-07 ENCOUNTER — Emergency Department (HOSPITAL_COMMUNITY)
Admission: EM | Admit: 2013-08-07 | Discharge: 2013-08-07 | Disposition: A | Discharge status: Home / Self Care | Payer: Self-pay | Attending: Emergency Medicine | Admitting: Emergency Medicine

## 2013-08-07 ENCOUNTER — Emergency Department (HOSPITAL_COMMUNITY): Payer: Self-pay

## 2013-08-07 ENCOUNTER — Encounter (HOSPITAL_COMMUNITY): Payer: Self-pay | Admitting: Emergency Medicine

## 2013-08-07 DIAGNOSIS — J45909 Unspecified asthma, uncomplicated: Secondary | ICD-10-CM | POA: Insufficient documentation

## 2013-08-07 DIAGNOSIS — M25561 Pain in right knee: Secondary | ICD-10-CM

## 2013-08-07 DIAGNOSIS — M25461 Effusion, right knee: Secondary | ICD-10-CM

## 2013-08-07 DIAGNOSIS — F17200 Nicotine dependence, unspecified, uncomplicated: Secondary | ICD-10-CM | POA: Insufficient documentation

## 2013-08-07 DIAGNOSIS — M25569 Pain in unspecified knee: Secondary | ICD-10-CM | POA: Insufficient documentation

## 2013-08-07 DIAGNOSIS — M25469 Effusion, unspecified knee: Secondary | ICD-10-CM | POA: Insufficient documentation

## 2013-08-07 DIAGNOSIS — M7989 Other specified soft tissue disorders: Secondary | ICD-10-CM

## 2013-08-07 DIAGNOSIS — I10 Essential (primary) hypertension: Secondary | ICD-10-CM | POA: Insufficient documentation

## 2013-08-07 LAB — BASIC METABOLIC PANEL
Anion gap: 13 (ref 5–15)
BUN: 7 mg/dL (ref 6–23)
CO2: 29 mEq/L (ref 19–32)
Calcium: 9.4 mg/dL (ref 8.4–10.5)
Chloride: 98 mEq/L (ref 96–112)
Creatinine, Ser: 0.68 mg/dL (ref 0.50–1.35)
GFR calc Af Amer: >90 mL/min (ref >90)
GFR calc non Af Amer: >90 mL/min (ref >90)
Glucose, Bld: 93 mg/dL (ref 70–99)
Potassium: 4 mEq/L (ref 3.7–5.3)
Sodium: 140 mEq/L (ref 137–147)

## 2013-08-07 LAB — CBC WITH DIFFERENTIAL/PLATELET
Basophils Absolute: 0 10*3/uL (ref 0.0–0.1)
Basophils Relative: 0 % (ref 0–1)
Eosinophils Absolute: 0.2 10*3/uL (ref 0.0–0.7)
Eosinophils Relative: 3 % (ref 0–5)
HCT: 37.5 % — ABNORMAL LOW (ref 39.0–52.0)
Hemoglobin: 13 g/dL (ref 13.0–17.0)
Lymphocytes Relative: 21 % (ref 12–46)
Lymphs Abs: 1.8 10*3/uL (ref 0.7–4.0)
MCH: 33.8 pg (ref 26.0–34.0)
MCHC: 34.7 g/dL (ref 30.0–36.0)
MCV: 97.4 fL (ref 78.0–100.0)
Monocytes Absolute: 1.4 10*3/uL — ABNORMAL HIGH (ref 0.1–1.0)
Monocytes Relative: 17 % — ABNORMAL HIGH (ref 3–12)
Neutro Abs: 5 10*3/uL (ref 1.7–7.7)
Neutrophils Relative %: 59 % (ref 43–77)
Platelets: 264 10*3/uL (ref 150–400)
RBC: 3.85 MIL/uL — ABNORMAL LOW (ref 4.22–5.81)
RDW: 14.6 % (ref 11.5–15.5)
WBC: 8.5 10*3/uL (ref 4.0–10.5)

## 2013-08-07 MED ORDER — LISINOPRIL 20 MG PO TABS
30.0000 mg | ORAL_TABLET | Freq: Once | ORAL | Status: AC
  Administered 2013-08-07: 30 mg via ORAL
  Filled 2013-08-07 (×2): qty 1

## 2013-08-07 MED ORDER — OXYCODONE-ACETAMINOPHEN 5-325 MG PO TABS
2.0000 | ORAL_TABLET | Freq: Once | ORAL | Status: AC
  Administered 2013-08-07: 2 via ORAL
  Filled 2013-08-07: qty 2

## 2013-08-07 MED ORDER — OXYCODONE-ACETAMINOPHEN 5-325 MG PO TABS: 1.0000 | ORAL_TABLET | Freq: Four times a day (QID) | ORAL | Status: DC | PRN

## 2013-08-07 MED ORDER — MELOXICAM 7.5 MG PO TABS: 15.0000 mg | ORAL_TABLET | Freq: Every day | ORAL | Status: DC

## 2013-08-07 MED ORDER — LISINOPRIL 30 MG PO TABS: 30.0000 mg | ORAL_TABLET | Freq: Every day | ORAL | Status: DC

## 2013-08-07 NOTE — Progress Notes (Addendum)
Right lower extremity venous duplex.  Right:  No evidence of DVT or superficial thrombosis.  There is an irregular, anechoic structure in the proximal calf, question new muscle tear vs old cyst.  Note:  Fluid was noted in the right proximal calf in February, 2013.  Left:  Negative for DVT in the common femoral vein.
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
## 2013-08-07 NOTE — ED Notes (Signed)
Contacted vascular regarding delay, pt updated.

## 2013-08-07 NOTE — ED Provider Notes (Signed)
Medical screening examination/treatment/procedure(s) were performed by non-physician practitioner and as supervising physician I was immediately available for consultation/collaboration.   EKG Interpretation None       Glynn OctaveStephen Vihaan Gloss, MD 08/07/13 1745

## 2013-08-14 ENCOUNTER — Emergency Department (HOSPITAL_COMMUNITY): Payer: Self-pay

## 2013-08-14 ENCOUNTER — Emergency Department (HOSPITAL_COMMUNITY)
Admission: EM | Admit: 2013-08-14 | Discharge: 2013-08-14 | Disposition: A | Discharge status: Home / Self Care | Payer: Self-pay | Attending: Emergency Medicine | Admitting: Emergency Medicine

## 2013-08-14 ENCOUNTER — Encounter (HOSPITAL_COMMUNITY): Payer: Self-pay | Admitting: Emergency Medicine

## 2013-08-14 DIAGNOSIS — I10 Essential (primary) hypertension: Secondary | ICD-10-CM | POA: Insufficient documentation

## 2013-08-14 DIAGNOSIS — Z79899 Other long term (current) drug therapy: Secondary | ICD-10-CM | POA: Insufficient documentation

## 2013-08-14 DIAGNOSIS — F17200 Nicotine dependence, unspecified, uncomplicated: Secondary | ICD-10-CM | POA: Insufficient documentation

## 2013-08-14 DIAGNOSIS — R6883 Chills (without fever): Secondary | ICD-10-CM | POA: Insufficient documentation

## 2013-08-14 DIAGNOSIS — R000 Tachycardia, unspecified: Secondary | ICD-10-CM | POA: Insufficient documentation

## 2013-08-14 DIAGNOSIS — J45909 Unspecified asthma, uncomplicated: Secondary | ICD-10-CM | POA: Insufficient documentation

## 2013-08-14 DIAGNOSIS — M25461 Effusion, right knee: Secondary | ICD-10-CM

## 2013-08-14 DIAGNOSIS — R269 Unspecified abnormalities of gait and mobility: Secondary | ICD-10-CM | POA: Insufficient documentation

## 2013-08-14 DIAGNOSIS — M25469 Effusion, unspecified knee: Secondary | ICD-10-CM | POA: Insufficient documentation

## 2013-08-14 DIAGNOSIS — M7989 Other specified soft tissue disorders: Secondary | ICD-10-CM | POA: Insufficient documentation

## 2013-08-14 LAB — CBG MONITORING, ED: GLUCOSE-CAPILLARY: 124 mg/dL — AB (ref 70–99)

## 2013-08-14 LAB — CBC WITH DIFFERENTIAL/PLATELET
BASOS ABS: 0 10*3/uL (ref 0.0–0.1)
Basophils Relative: 0 % (ref 0–1)
EOS PCT: 2 % (ref 0–5)
Eosinophils Absolute: 0.2 10*3/uL (ref 0.0–0.7)
HEMATOCRIT: 36.8 % — AB (ref 39.0–52.0)
Hemoglobin: 12.8 g/dL — ABNORMAL LOW (ref 13.0–17.0)
LYMPHS ABS: 2 10*3/uL (ref 0.7–4.0)
Lymphocytes Relative: 22 % (ref 12–46)
MCH: 34.5 pg — AB (ref 26.0–34.0)
MCHC: 34.8 g/dL (ref 30.0–36.0)
MCV: 99.2 fL (ref 78.0–100.0)
MONO ABS: 1.6 10*3/uL — AB (ref 0.1–1.0)
MONOS PCT: 17 % — AB (ref 3–12)
NEUTROS ABS: 5.5 10*3/uL (ref 1.7–7.7)
Neutrophils Relative %: 59 % (ref 43–77)
PLATELETS: 388 10*3/uL (ref 150–400)
RBC: 3.71 MIL/uL — ABNORMAL LOW (ref 4.22–5.81)
RDW: 14.4 % (ref 11.5–15.5)
WBC: 9.3 10*3/uL (ref 4.0–10.5)

## 2013-08-14 LAB — SYNOVIAL CELL COUNT + DIFF, W/ CRYSTALS
CRYSTALS FLUID: NONE SEEN
LYMPHOCYTES-SYNOVIAL FLD: 1 % (ref 0–20)
MONOCYTE-MACROPHAGE-SYNOVIAL FLUID: 1 % — AB (ref 50–90)
NEUTROPHIL, SYNOVIAL: 98 % — AB (ref 0–25)
WBC, SYNOVIAL: 28280 /mm3 — AB (ref 0–200)

## 2013-08-14 LAB — BASIC METABOLIC PANEL
ANION GAP: 12 (ref 5–15)
BUN: 9 mg/dL (ref 6–23)
CALCIUM: 9.5 mg/dL (ref 8.4–10.5)
CHLORIDE: 97 meq/L (ref 96–112)
CO2: 28 meq/L (ref 19–32)
CREATININE: 0.74 mg/dL (ref 0.50–1.35)
GFR calc Af Amer: >90 mL/min (ref >90)
GFR calc non Af Amer: >90 mL/min (ref >90)
Glucose, Bld: 109 mg/dL — ABNORMAL HIGH (ref 70–99)
Potassium: 3.9 mEq/L (ref 3.7–5.3)
Sodium: 137 mEq/L (ref 137–147)

## 2013-08-14 LAB — GRAM STAIN

## 2013-08-14 LAB — GLUCOSE, SYNOVIAL FLUID: Glucose, Synovial Fluid: 56 mg/dL

## 2013-08-14 MED ORDER — MORPHINE SULFATE 4 MG/ML IJ SOLN
4.0000 mg | Freq: Once | INTRAMUSCULAR | Status: AC
  Administered 2013-08-14: 4 mg via INTRAVENOUS
  Filled 2013-08-14: qty 1

## 2013-08-14 MED ORDER — OXYCODONE-ACETAMINOPHEN 5-325 MG PO TABS: 1.0000 | ORAL_TABLET | ORAL | Status: DC | PRN

## 2013-08-14 MED ORDER — NAPROXEN 500 MG PO TBEC
500.0000 mg | DELAYED_RELEASE_TABLET | Freq: Two times a day (BID) | ORAL | Status: DC
Start: 2013-08-14 — End: 2014-10-03

## 2013-08-14 MED ORDER — SODIUM CHLORIDE 0.9 % IV BOLUS (SEPSIS)
500.0000 mL | Freq: Once | INTRAVENOUS | Status: AC
  Administered 2013-08-14: 500 mL via INTRAVENOUS

## 2013-08-14 MED ORDER — PREDNISONE 20 MG PO TABS: 60.0000 mg | ORAL_TABLET | Freq: Once | ORAL | Status: DC

## 2013-08-14 MED ORDER — LISINOPRIL 10 MG PO TABS: 30.0000 mg | ORAL_TABLET | Freq: Every day | ORAL | Status: DC

## 2013-08-14 MED ORDER — HYDROMORPHONE HCL PF 1 MG/ML IJ SOLN: 0.5000 mg | Freq: Once | INTRAMUSCULAR | Status: DC

## 2013-08-14 MED ORDER — DEXAMETHASONE SODIUM PHOSPHATE 4 MG/ML IJ SOLN
10.0000 mg | Freq: Once | INTRAMUSCULAR | Status: AC
  Administered 2013-08-14: 10 mg via INTRAVENOUS
  Filled 2013-08-14: qty 1
  Filled 2013-08-14: qty 3

## 2013-08-14 MED ORDER — ACETAMINOPHEN 500 MG PO TABS
1000.0000 mg | ORAL_TABLET | Freq: Once | ORAL | Status: AC
  Administered 2013-08-14: 1000 mg via ORAL
  Filled 2013-08-14: qty 2

## 2013-08-14 NOTE — Discharge Planning (Signed)
[facility] Community Liaison  Spoke to patient regarding primary care resources and the [facility] card. Patient was given the orange card application and instructed to contact this liaison to further go over the application and services. Resource guide and my contact information also provided for any future questions or concerns. No other community liaison needs identified at this time.

## 2013-08-14 NOTE — Discharge Instructions (Signed)
Call a orthopedist specialist for further evaluation of your joint pain and injury. Call for a follow up appointment with a Family or Primary Care Provider.  Return if Symptoms worsen.   Take medication as prescribed. Do not fill previous prescriptions. Ice your knee 3-4 times a day. Elevate it above your heart when you're not walking or standing. Use crutches as previously prescribed. Do not operate heavy machinery, drive, or drink alcohol while on medication.

## 2013-08-14 NOTE — ED Notes (Signed)
MD at bedside. Parker pa at bedside, aspiration of knee fluid in progres
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
## 2013-08-14 NOTE — ED Notes (Signed)
Vital signs stable. 

## 2013-08-14 NOTE — ED Notes (Signed)
Patient transported to X-ray 

## 2013-08-17 LAB — BODY FLUID CULTURE: Culture: NO GROWTH

## 2013-12-26 ENCOUNTER — Encounter (HOSPITAL_COMMUNITY): Payer: Self-pay | Admitting: *Deleted

## 2013-12-26 ENCOUNTER — Emergency Department (HOSPITAL_COMMUNITY): Payer: Self-pay

## 2013-12-26 ENCOUNTER — Emergency Department (HOSPITAL_COMMUNITY)
Admission: EM | Admit: 2013-12-26 | Discharge: 2013-12-27 | Disposition: A | Discharge status: Home / Self Care | Payer: Self-pay | Attending: Emergency Medicine | Admitting: Emergency Medicine

## 2013-12-26 DIAGNOSIS — W1830XA Fall on same level, unspecified, initial encounter: Secondary | ICD-10-CM | POA: Insufficient documentation

## 2013-12-26 DIAGNOSIS — S63259A Unspecified dislocation of unspecified finger, initial encounter: Secondary | ICD-10-CM

## 2013-12-26 DIAGNOSIS — Z791 Long term (current) use of non-steroidal anti-inflammatories (NSAID): Secondary | ICD-10-CM | POA: Insufficient documentation

## 2013-12-26 DIAGNOSIS — J45909 Unspecified asthma, uncomplicated: Secondary | ICD-10-CM | POA: Insufficient documentation

## 2013-12-26 DIAGNOSIS — Y998 Other external cause status: Secondary | ICD-10-CM | POA: Insufficient documentation

## 2013-12-26 DIAGNOSIS — F10220 Alcohol dependence with intoxication, uncomplicated: Secondary | ICD-10-CM

## 2013-12-26 DIAGNOSIS — F10129 Alcohol abuse with intoxication, unspecified: Secondary | ICD-10-CM | POA: Insufficient documentation

## 2013-12-26 DIAGNOSIS — Z79899 Other long term (current) drug therapy: Secondary | ICD-10-CM | POA: Insufficient documentation

## 2013-12-26 DIAGNOSIS — Z720 Tobacco use: Secondary | ICD-10-CM | POA: Insufficient documentation

## 2013-12-26 DIAGNOSIS — I10 Essential (primary) hypertension: Secondary | ICD-10-CM | POA: Insufficient documentation

## 2013-12-26 DIAGNOSIS — Y9389 Activity, other specified: Secondary | ICD-10-CM | POA: Insufficient documentation

## 2013-12-26 DIAGNOSIS — Y9289 Other specified places as the place of occurrence of the external cause: Secondary | ICD-10-CM | POA: Insufficient documentation

## 2013-12-26 DIAGNOSIS — S63264A Dislocation of metacarpophalangeal joint of right ring finger, initial encounter: Secondary | ICD-10-CM | POA: Insufficient documentation
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
## 2013-12-26 NOTE — ED Notes (Signed)
Bed: [id number] Expected date: 12/26/13 Expected time: 9:27 PM Means of arrival: Ambulance Comments: Fall/hand injury/ETOH
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
## 2013-12-27 NOTE — ED Notes (Signed)
Called family member to come get patient.

## 2013-12-27 NOTE — ED Notes (Signed)
Ortho Tech at bedside placing splint.

## 2013-12-27 NOTE — Discharge Instructions (Signed)
Finger Dislocation Finger dislocation is the displacement of bones in your finger at the joints. Most commonly, finger dislocation occurs at the proximal interphalangeal joint (the joint closest to your knuckle). Very strong, fibrous tissues (ligaments) and joint capsules connect the three bones of your fingers.  CAUSES Dislocation is caused by a forceful impact. This impact moves these bones off the joint and often tears your ligaments.  SYMPTOMS Symptoms of finger dislocation include:  Deformity of your finger.  Pain, with loss of movement. DIAGNOSIS  Finger dislocation is diagnosed with a physical exam. Often, X-ray exams are done to see if you have associated injuries, such as bone fractures. TREATMENT  Finger dislocations are treated by putting your bones back into position (reduction) either by manually moving the bones back into place or through surgery. Your finger is then kept in a fixed position (immobilized) with the use of a dressing or splint for a brief period. When your ligament has to be surgically repaired, it needs to be kept in a fixed position with a dressing or splint for 1 to 2 weeks. Because joint stiffness is a long-term complication of finger dislocation, hand exercises or physical therapy to increase the range of motion and to regain strength is usually started as soon as the ligament is healed. Exercises and therapy generally last no more than 3 months. HOME CARE INSTRUCTIONS The following measures can help to reduce pain and speed up the healing process:  Rest your injured joint. Do not move until instructed otherwise by your caregiver. Avoid activities similar to the one that caused your injury.  Apply ice to your injured joint for the first day or 2 after your reduction or as directed by your caregiver. Applying ice helps to reduce inflammation and pain.  Put ice in a plastic bag.  Place a towel between your skin and the bag.  Leave the ice on for 15-20 minutes  at a time, every 2 hours while you are awake.  Elevate your hand above your heart as directed by your caregiver to reduce swelling.  Take over-the-counter or prescription medicine for pain as your caregiver instructs you. SEEK IMMEDIATE MEDICAL CARE IF:  Your dressing or splint becomes damaged.  Your pain becomes worse rather than better.  You lose feeling in your finger, or it becomes cold and white. MAKE SURE YOU:  Understand these instructions.  Will watch your condition.  Will get help right away if you are not doing well or get worse. Document Released: 01/07/2000 Document Revised: 04/03/2011 Document Reviewed: 10/30/2010 [facility] Patient Information 2015 [place]. This information is not intended to replace advice given to you by your health care provider. Make sure you discuss any questions you have with your health care provider.  Alcohol Use Disorder Alcohol use disorder is a mental disorder. It is not a one-time incident of heavy drinking. Alcohol use disorder is the excessive and uncontrollable use of alcohol over time that leads to problems with functioning in one or more areas of daily living. People with this disorder risk harming themselves and others when they drink to excess. Alcohol use disorder also can cause other mental disorders, such as mood and anxiety disorders, and serious physical problems. People with alcohol use disorder often misuse other drugs.  Alcohol use disorder is common and widespread. Some people with this disorder drink alcohol to cope with or escape from negative life events. Others drink to relieve chronic pain or symptoms of mental illness. People with a family history  of alcohol use disorder are at higher risk of losing control and using alcohol to excess.  SYMPTOMS  Signs and symptoms of alcohol use disorder may include the following:   Consumption ofalcohol inlarger amounts or over a longer period of time than intended.  Multiple  unsuccessful attempts to cutdown or control alcohol use.   A great deal of time spent obtaining alcohol, using alcohol, or recovering from the effects of alcohol (hangover).  A strong desire or urge to use alcohol (cravings).   Continued use of alcohol despite problems at work, school, or home because of alcohol use.   Continued use of alcohol despite problems in relationships because of alcohol use.  Continued use of alcohol in situations when it is physically hazardous, such as driving a car.  Continued use of alcohol despite awareness of a physical or psychological problem that is likely related to alcohol use. Physical problems related to alcohol use can involve the brain, heart, liver, stomach, and intestines. Psychological problems related to alcohol use include intoxication, depression, anxiety, psychosis, delirium, and dementia.   The need for increased amounts of alcohol to achieve the same desired effect, or a decreased effect from the consumption of the same amount of alcohol (tolerance).  Withdrawal symptoms upon reducing or stopping alcohol use, or alcohol use to reduce or avoid withdrawal symptoms. Withdrawal symptoms include:  Racing heart.  Hand tremor.  Difficulty sleeping.  Nausea.  Vomiting.  Hallucinations.  Restlessness.  Seizures. DIAGNOSIS Alcohol use disorder is diagnosed through an assessment by your health care provider. Your health care provider may start by asking three or four questions to screen for excessive or problematic alcohol use. To confirm a diagnosis of alcohol use disorder, at least two symptoms must be present within a 10-month period. The severity of alcohol use disorder depends on the number of symptoms:  Mild--two or three.  Moderate--four or five.  Severe--six or more. Your health care provider may perform a physical exam or use results from lab tests to see if you have physical problems resulting from alcohol use. Your health  care provider may refer you to a mental health professional for evaluation. TREATMENT  Some people with alcohol use disorder are able to reduce their alcohol use to low-risk levels. Some people with alcohol use disorder need to quit drinking alcohol. When necessary, mental health professionals with specialized training in substance use treatment can help. Your health care provider can help you decide how severe your alcohol use disorder is and what type of treatment you need. The following forms of treatment are available:   Detoxification. Detoxification involves the use of prescription medicines to prevent alcohol withdrawal symptoms in the first week after quitting. This is important for people with a history of symptoms of withdrawal and for heavy drinkers who are likely to have withdrawal symptoms. Alcohol withdrawal can be dangerous and, in severe cases, cause death. Detoxification is usually provided in a hospital or in-patient substance use treatment facility.  Counseling or talk therapy. Talk therapy is provided by substance use treatment counselors. It addresses the reasons people use alcohol and ways to keep them from drinking again. The goals of talk therapy are to help people with alcohol use disorder find healthy activities and ways to cope with life stress, to identify and avoid triggers for alcohol use, and to handle cravings, which can cause relapse.  Medicines.Different medicines can help treat alcohol use disorder through the following actions:  Decrease alcohol cravings.  Decrease the positive reward  response felt from alcohol use.  Produce an uncomfortable physical reaction when alcohol is used (aversion therapy).  Support groups. Support groups are run by people who have quit drinking. They provide emotional support, advice, and guidance. These forms of treatment are often combined. Some people with alcohol use disorder benefit from intensive combination treatment provided by  specialized substance use treatment centers. Both inpatient and outpatient treatment programs are available. Document Released: 02/17/2004 Document Revised: 05/26/2013 Document Reviewed: 04/18/2012 [facility] Patient Information 2015 [place], [place]. This information is not intended to replace advice given to you by your health care provider. Make sure you discuss any questions you have with your health care provider.  Emergency Department Resource Guide 1) Find a Doctor and Pay Out of Pocket Although you won't have to find out who is covered by your insurance plan, it is a good idea to ask around and get recommendations. You will then need to call the office and see if the doctor you have chosen will accept you as a new patient and what types of options they offer for patients who are self-pay. Some doctors offer discounts or will set up payment plans for their patients who do not have insurance, but you will need to ask so you aren't surprised when you get to your appointment.  2) Contact Your Local Health Department Not all health departments have doctors that can see patients for sick visits, but many do, so it is worth a call to see if yours does. If you don't know where your local health department is, you can check in your phone book. The CDC also has a tool to help you locate your state's health department, and many state websites also have listings of all of their local health departments.  3) Find a Walk-in Clinic If your illness is not likely to be very severe or complicated, you may want to try a walk in clinic. These are popping up all over the country in pharmacies, drugstores, and shopping centers. They're usually staffed by nurse practitioners or physician assistants that have been trained to treat common illnesses and complaints. They're usually fairly quick and inexpensive. However, if you have serious medical issues or chronic medical problems, these are probably not your best  option.  No Primary Care Doctor: - Call Health Connect at  [phone] - they can help you locate a primary care doctor that  accepts your insurance, provides certain services, etc. - Physician Referral Service- [phone]  Chronic Pain Problems: Organization         Address  Phone   Notes  [facility]  [phone] Patients need to be referred by their primary care doctor.   Medication Assistance: Organization         Address  Phone   Notes  [facility] [address] [place] [place], [place] [address] [phone] --Must be a resident of [facility] -- Must have NO insurance coverage whatsoever (no Medicaid/ Medicare, etc.) -- The pt. MUST have a primary care doctor that directs their care regularly and follows them in the community   [facility]  [phone]   [employer]  [phone]    Agencies that provide inexpensive medical care: Organization         Address  Phone   Notes  [facility]  [phone]   [facility]    [phone]   [facility] [address]  [address] [place], [place] [address] [phone]   [facility] [place] [address], [place] [phone]   [facility]    [phone]) [phone]   [facility]    [phone]   [facility]  [address], [place] Phone:  [phone], Fax:  [phone] Hours of Operation:  9 am - 6 pm, M-F.  Also accepts Medicaid/Medicare and self-pay.  [facility]  [address], [place] Phone: [phone]) [phone], Fax: [phone]. Hours of Operation:  8:30 am - 5:30 pm, M-F.  Also accepts Medicaid and self-pay.  [facility] [place] [address], [place] Phone: [phone]   [facility] [address], [place], [place] [phone], Ext. [phone] Mondays & Thursdays: 7-9 AM.  First 15  patients are seen on a first come, first serve basis.    Medicaid-accepting [facility] Providers:  Organization         Address  Phone   Notes  [facility] [address], [place] [phone]) [phone] Also accepts self-pay patients.  [facility] [address], [place]  [phone]   [facility] [address], [place] [phone]) [phone]   [facility] [address] [place] [address], [place] [phone]   Renaye RakersVeita Bland [address], [place]   [phone] Only accepts [place] Access [place] patients after they have their name applied to their card.   Self-Pay (no insurance) in [facility]:  Organization         Address  Phone   Notes  Sickle Cell Patients, [facility] [address], [place] [phone]   [facility] [address], [place] [phone]) [phone]   [facility]  [address], [place] [phone]   [facility]/Dr. Osei-Bonsu  [address] [place] [address], [place] or [phone], [place] [phone] Phone number for both [place] and [place] locations is the same.  [facility] [address], [place] [phone]   [facility] [address] [place] [address], [place] or [address] [phone]   [facility] [address], [place] [phone], phone; [phone], fax Sees patients 1st and 3rd Saturday of every month.  Must not qualify for public or private insurance (i.e. Medicaid, Medicare, [place] Health Choice, Veterans' Benefits)  Household income should be no more than 200% of the poverty level The clinic cannot treat you if you are pregnant or think you are pregnant  Sexually transmitted diseases are not treated at the clinic.    Dental  Care: Organization         Address  Phone  Notes  [facility] [address], [place] [phone] Accepts children up to age 41 who are enrolled in [place] or [place] Health Choice; pregnant women with a Medicaid card; and children who have applied for Medicaid or [place] Health Choice, but were declined, whose parents can pay a reduced fee at time of service.  [facility] [place]  [address], [place] [phone]) [phone] Accepts children up to age 41 who are enrolled in [place] or [facility]  Health Choice; pregnant women with a Medicaid card; and children who have applied for Medicaid or [place] Health Choice, but were declined, whose parents can pay a reduced fee at time of service.  °[facility] ° [address], [place] [phone] Patients are seen by appointment only. Walk-ins are not accepted. [facility] will see patients 18 years of age and older. °Monday - Tuesday (8am-5pm) °Most Wednesdays (8:30-5pm) °$30 per visit, cash only  °[facility] ° [address], [place] [phone]) [phone] Patients are seen by appointment only. Walk-ins are not accepted. [facility] will see patients 18 years of age and older. °One Wednesday Evening (Monthly: Volunteer Based).  $30 per visit, cash only  °[facility]  [phone] for adults; Children under age 4, call Graduate Pediatric Dentistry at [phone]. Children aged 4-14, please call [phone] to request a pediatric application. ° Dental services are provided in all areas of dental care including fillings, crowns and bridges, complete and partial dentures, implants, gum treatment, root canals, and extractions. Preventive care is also provided. Treatment is provided to both adults and children. °Patients are selected via a lottery and there is often a waiting list. °  °[facility] [address], °[place] ° (336) [phone] [email or web address] °  °[facility] [address], [place], [place] [phone], Ext. [phone] Second and Fourth Thursday of each month, opens at 6:30 AM; Clinic ends at 9 AM.  Patients are seen on a first-come first-served basis, and a limited number are seen during each clinic.  ° °[facility] ° [address], [place], [place] [phone]) [phone]   Eligibility Requirements °You must have lived in [place], [place], or [place] counties for at least the last three months. °  You cannot be eligible for state or federal sponsored healthcare insurance, including Veterans Administration, Medicaid, or Medicare. °  You generally cannot be eligible for healthcare insurance through your employer.  °  How to apply: °Eligibility screenings are held every Tuesday and Wednesday afternoon from 1:00 pm until 4:00 pm. You do not need an appointment for the interview!  °[facility] [address], [place], [place] [phone]   °[facility]  336-342-8273   °[facility]  336-703-3100   °[facility]  336-570-6415   ° °Behavioral Health Resources in the Community: °Intensive Outpatient Programs °Organization         Address  Phone  Notes  °[facility] [address], [place], [place] 336-878-6098   °[facility] [address], [place], [place] 336-832-9800   °[facility] [address], [place], [place] ° 336-882-2125   °[facility] [address],  °[place], [place] 1-800-853-5163 or 336-641-4981   °Substance Abuse Resources °Organization         Address  Phone  Notes  °[facility]  336-882-2125   °[facility]  336-784-9470   °[facility]  336-285-9073   °[facility]  336-845-3988   °Residential & Outpatient Substance Abuse Program  1-800-659-3381    °Psychological Services °Organization         Address  Phone  Notes  °[facility]  336- 832-9600   °[facility]  336- 378-7881   °[facility] [address], [place] 1-800-853-5163 or 336-641-4981   ° °  Mobile Crisis Teams °Organization         Address  Phone  Notes  °[facility], Mobile Crisis Care Unit  1-877-626-1772   °Assertive °[facility] Services ° [address] [place], [place] 336-834-9664   °Sharon DeEsch [address] °[place] 336-554-5454   ° °Self-Help/Support Groups °Organization         Address  Phone             Notes  °[facility] [place] - variety of support groups  336- 373-1402 Call for more information  °Narcotics Anonymous (NA), [facility] [address], °[place]  2 meetings at this location  ° °Residential Treatment Programs °Organization         Address  Phone  Notes  °[facility] [address],    °[place]  1-866-801-8205   °[facility] ° [address], [place], [place] 704-293-8524   °[facility] [address], [place] 336-845-3988 Admissions: 8am-3pm M-F  °[facility] [address]    °[place], [place] 336-841-1104   °[facility] [address], [place], [place] 336-379-7146   °[facility] [address]  °[place], [place] 336-285-9073   °[facility] - Intensive Outpatient [address], [place], [place] 336-852-3033   °[facility] [address]  °[place], [place] 1-877-615-2722 or 336-784-9470   °[facility] ([facility]) [address] [place], [place] 336-227-7417 Accepts Medicaid  °[facility] [address]  °[place] 1-800-659-3381 Substance Abuse/Addiction Treatment  ° °[place] [facility] Health Resources °Organization         Address  Phone  Notes  °[facility]  (888) 581-9988   °Julie Brannon, PhD [address] [place], [place]   (336) 349-5553 or (336) 951-0000   °[facility]   [address] °[place], [place] (336) 349-4454   °[facility] [address], [place], [place] (336) 342-8316 Insurance/Medicaid/sponsorship through [facility]  °[facility] [address]                                    [place], [place] (336) 342-8316 Therapy/tele-psych/case  °[facility] [address]  ° [place], [place] (336) 349-2233    °Dr. Arfeen  (336) 349-4544   °[facility] [place]  [employer] [place] [facility] 1) [address], [place] °2) [address], [place] °3)  [address], [place] (336) 349-3220 °(336) 342-7768 ° °(336) 342-8140   °[place] Child Abuse Hotline (336) 342-1394 or (336) 342-3537 (After Hours)    ° ° ° °

## 2014-01-13 ENCOUNTER — Emergency Department (HOSPITAL_COMMUNITY)
Admission: EM | Admit: 2014-01-13 | Discharge: 2014-01-13 | Disposition: A | Discharge status: Home / Self Care | Payer: Self-pay | Attending: Emergency Medicine | Admitting: Emergency Medicine

## 2014-01-13 ENCOUNTER — Encounter (HOSPITAL_COMMUNITY): Payer: Self-pay | Admitting: *Deleted

## 2014-01-13 ENCOUNTER — Emergency Department (HOSPITAL_COMMUNITY): Payer: Self-pay

## 2014-01-13 DIAGNOSIS — Y9289 Other specified places as the place of occurrence of the external cause: Secondary | ICD-10-CM | POA: Insufficient documentation

## 2014-01-13 DIAGNOSIS — Z791 Long term (current) use of non-steroidal anti-inflammatories (NSAID): Secondary | ICD-10-CM | POA: Insufficient documentation

## 2014-01-13 DIAGNOSIS — Y9389 Activity, other specified: Secondary | ICD-10-CM | POA: Insufficient documentation

## 2014-01-13 DIAGNOSIS — M79641 Pain in right hand: Secondary | ICD-10-CM

## 2014-01-13 DIAGNOSIS — Y998 Other external cause status: Secondary | ICD-10-CM | POA: Insufficient documentation

## 2014-01-13 DIAGNOSIS — J45909 Unspecified asthma, uncomplicated: Secondary | ICD-10-CM | POA: Insufficient documentation

## 2014-01-13 DIAGNOSIS — F10129 Alcohol abuse with intoxication, unspecified: Secondary | ICD-10-CM | POA: Insufficient documentation

## 2014-01-13 DIAGNOSIS — I10 Essential (primary) hypertension: Secondary | ICD-10-CM | POA: Insufficient documentation

## 2014-01-13 DIAGNOSIS — R52 Pain, unspecified: Secondary | ICD-10-CM

## 2014-01-13 DIAGNOSIS — Z79899 Other long term (current) drug therapy: Secondary | ICD-10-CM | POA: Insufficient documentation

## 2014-01-13 DIAGNOSIS — Z720 Tobacco use: Secondary | ICD-10-CM | POA: Insufficient documentation

## 2014-01-13 DIAGNOSIS — W500XXA Accidental hit or strike by another person, initial encounter: Secondary | ICD-10-CM | POA: Insufficient documentation

## 2014-01-13 DIAGNOSIS — S6991XA Unspecified injury of right wrist, hand and finger(s), initial encounter: Secondary | ICD-10-CM | POA: Insufficient documentation

## 2014-01-13 MED ORDER — IBUPROFEN 800 MG PO TABS
800.0000 mg | ORAL_TABLET | Freq: Once | ORAL | Status: AC
  Administered 2014-01-13: 800 mg via ORAL
  Filled 2014-01-13: qty 1
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
## 2014-01-13 NOTE — Discharge Instructions (Signed)

## 2014-02-07 ENCOUNTER — Emergency Department (HOSPITAL_COMMUNITY): Payer: Self-pay

## 2014-02-07 ENCOUNTER — Emergency Department (HOSPITAL_COMMUNITY)
Admission: EM | Admit: 2014-02-07 | Discharge: 2014-02-07 | Disposition: A | Discharge status: Home / Self Care | Payer: Self-pay | Attending: Emergency Medicine | Admitting: Emergency Medicine

## 2014-02-07 ENCOUNTER — Encounter (HOSPITAL_COMMUNITY): Payer: Self-pay | Admitting: Emergency Medicine

## 2014-02-07 DIAGNOSIS — S6991XA Unspecified injury of right wrist, hand and finger(s), initial encounter: Secondary | ICD-10-CM | POA: Insufficient documentation

## 2014-02-07 DIAGNOSIS — S01511A Laceration without foreign body of lip, initial encounter: Secondary | ICD-10-CM | POA: Insufficient documentation

## 2014-02-07 DIAGNOSIS — Y9389 Activity, other specified: Secondary | ICD-10-CM | POA: Insufficient documentation

## 2014-02-07 DIAGNOSIS — Z79899 Other long term (current) drug therapy: Secondary | ICD-10-CM | POA: Insufficient documentation

## 2014-02-07 DIAGNOSIS — IMO0002 Reserved for concepts with insufficient information to code with codable children: Secondary | ICD-10-CM

## 2014-02-07 DIAGNOSIS — J45909 Unspecified asthma, uncomplicated: Secondary | ICD-10-CM | POA: Insufficient documentation

## 2014-02-07 DIAGNOSIS — Z720 Tobacco use: Secondary | ICD-10-CM | POA: Insufficient documentation

## 2014-02-07 DIAGNOSIS — F1010 Alcohol abuse, uncomplicated: Secondary | ICD-10-CM | POA: Insufficient documentation

## 2014-02-07 DIAGNOSIS — Y9289 Other specified places as the place of occurrence of the external cause: Secondary | ICD-10-CM | POA: Insufficient documentation

## 2014-02-07 DIAGNOSIS — I10 Essential (primary) hypertension: Secondary | ICD-10-CM | POA: Insufficient documentation

## 2014-02-07 DIAGNOSIS — Z791 Long term (current) use of non-steroidal anti-inflammatories (NSAID): Secondary | ICD-10-CM | POA: Insufficient documentation

## 2014-02-07 DIAGNOSIS — Y998 Other external cause status: Secondary | ICD-10-CM | POA: Insufficient documentation

## 2014-02-07 MED ORDER — TETANUS-DIPHTH-ACELL PERTUSSIS 5-2.5-18.5 LF-MCG/0.5 IM SUSP
0.5000 mL | Freq: Once | INTRAMUSCULAR | Status: DC
  Filled 2014-02-07: qty 0.5

## 2014-02-07 MED ORDER — LIDOCAINE-EPINEPHRINE 1 %-1:100000 IJ SOLN
10.0000 mL | Freq: Once | INTRAMUSCULAR | Status: AC
  Administered 2014-02-07: 10 mL
  Filled 2014-02-07: qty 1
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
## 2014-02-07 NOTE — ED Notes (Signed)
Discharge instructions reviewed with patient, patient refusing to get dressed initially. Pt given bus pass and getting dressed at this time. Pt yelling at this nurse telling me to "leave me the fu** alone lady." [employer] notified of pt's behavior and will assist in escorting pt out of the department.
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
## 2014-02-07 NOTE — ED Notes (Signed)
Taken to CT by radiology staff once more.  Encouraged to allow the CT scans to be completed.  States I will.

## 2014-10-03 ENCOUNTER — Emergency Department (INDEPENDENT_AMBULATORY_CARE_PROVIDER_SITE_OTHER)
Admission: EM | Admit: 2014-10-03 | Discharge: 2014-10-03 | Disposition: A | Discharge status: Home / Self Care | Payer: Self-pay | Source: Home / Self Care | Attending: Family Medicine | Admitting: Family Medicine

## 2014-10-03 ENCOUNTER — Encounter (HOSPITAL_COMMUNITY): Payer: Self-pay | Admitting: *Deleted

## 2014-10-03 DIAGNOSIS — M62838 Other muscle spasm: Secondary | ICD-10-CM

## 2014-10-03 DIAGNOSIS — I10 Essential (primary) hypertension: Secondary | ICD-10-CM

## 2014-10-03 DIAGNOSIS — M6249 Contracture of muscle, multiple sites: Secondary | ICD-10-CM

## 2014-10-03 LAB — POCT I-STAT, CHEM 8
BUN: 6 mg/dL (ref 6–20)
CHLORIDE: 98 mmol/L — AB (ref 101–111)
CREATININE: 0.8 mg/dL (ref 0.61–1.24)
Calcium, Ion: 1.15 mmol/L (ref 1.12–1.23)
Glucose, Bld: 99 mg/dL (ref 65–99)
HCT: 43 % (ref 39.0–52.0)
Hemoglobin: 14.6 g/dL (ref 13.0–17.0)
Potassium: 3.8 mmol/L (ref 3.5–5.1)
SODIUM: 139 mmol/L (ref 135–145)
TCO2: 29 mmol/L (ref 0–100)

## 2014-10-03 MED ORDER — NAPROXEN 500 MG PO TBEC: 500.0000 mg | DELAYED_RELEASE_TABLET | Freq: Two times a day (BID) | ORAL | Status: DC

## 2014-10-03 MED ORDER — CYCLOBENZAPRINE HCL 10 MG PO TABS: 10.0000 mg | ORAL_TABLET | Freq: Every evening | ORAL | Status: DC | PRN

## 2014-10-03 MED ORDER — LISINOPRIL 10 MG PO TABS: 10.0000 mg | ORAL_TABLET | Freq: Every day | ORAL | Status: DC

## 2014-10-03 NOTE — Discharge Instructions (Signed)
Thank you for coming in today. 1) Neck: Use a heating pad.  Take naproxen twice daily for pain and flexeril at bed time for muscle spasm. This will get better.  Come back or go to the emergency room if you notice new weakness new numbness problems walking or bowel or bladder problems.  2) Blood pressure: Take lisinopril daily.   Follow up with [facility] [address] [place], [place] [address] [phone]  Please call or see Ms Antionette Char for assistance with your bill.  You may qualify for reduced or free services.  Her phone number is [phone]. Her email is [email or web address]  Cervical Strain and Sprain (Whiplash) with Rehab Cervical strain and sprain are injuries that commonly occur with "whiplash" injuries. Whiplash occurs when the neck is forcefully whipped backward or forward, such as during a motor vehicle accident or during contact sports. The muscles, ligaments, tendons, discs, and nerves of the neck are susceptible to injury when this occurs. RISK FACTORS Risk of having a whiplash injury increases if:  Osteoarthritis of the spine.  Situations that make head or neck accidents or trauma more likely.  High-risk sports (football, rugby, wrestling, hockey, auto racing, gymnastics, diving, contact karate, or boxing).  Poor strength and flexibility of the neck.  Previous neck injury.  Poor tackling technique.  Improperly fitted or padded equipment. SYMPTOMS   Pain or stiffness in the front or back of neck or both.  Symptoms may present immediately or up to 24 hours after injury.  Dizziness, headache, nausea, and vomiting.  Muscle spasm with soreness and stiffness in the neck.  Tenderness and swelling at the injury site. PREVENTION  Learn and use proper technique (avoid tackling with the head, spearing, and head-butting; use proper falling techniques to avoid landing on the head).  Warm up and  stretch properly before activity.  Maintain physical fitness:  Strength, flexibility, and endurance.  Cardiovascular fitness.  Wear properly fitted and padded protective equipment, such as padded soft collars, for participation in contact sports. PROGNOSIS  Recovery from cervical strain and sprain injuries is dependent on the extent of the injury. These injuries are usually curable in 1 week to 3 months with appropriate treatment.  RELATED COMPLICATIONS   Temporary numbness and weakness may occur if the nerve roots are damaged, and this may persist until the nerve has completely healed.  Chronic pain due to frequent recurrence of symptoms.  Prolonged healing, especially if activity is resumed too soon (before complete recovery). TREATMENT  Treatment initially involves the use of ice and medication to help reduce pain and inflammation. It is also important to perform strengthening and stretching exercises and modify activities that worsen symptoms so the injury does not get worse. These exercises may be performed at home or with a therapist. For patients who experience severe symptoms, a soft, padded collar may be recommended to be worn around the neck.  Improving your posture may help reduce symptoms. Posture improvement includes pulling your chin and abdomen in while sitting or standing. If you are sitting, sit in a firm chair with your buttocks against the back of the chair. While sleeping, try replacing your pillow with a small towel rolled to 2 inches in diameter, or use a cervical pillow or soft cervical collar. Poor sleeping positions delay healing.  For patients with nerve root damage, which causes numbness or weakness, the use of a cervical traction apparatus may be recommended. Surgery is rarely necessary for these injuries. However,  cervical strain and sprains that are present at birth (congenital) may require surgery. MEDICATION   If pain medication is necessary, nonsteroidal  anti-inflammatory medications, such as aspirin and ibuprofen, or other minor pain relievers, such as acetaminophen, are often recommended.  Do not take pain medication for 7 days before surgery.  Prescription pain relievers may be given if deemed necessary by your caregiver. Use only as directed and only as much as you need. HEAT AND COLD:   Cold treatment (icing) relieves pain and reduces inflammation. Cold treatment should be applied for 10 to 15 minutes every 2 to 3 hours for inflammation and pain and immediately after any activity that aggravates your symptoms. Use ice packs or an ice massage.  Heat treatment may be used prior to performing the stretching and strengthening activities prescribed by your caregiver, physical therapist, or athletic trainer. Use a heat pack or a warm soak. SEEK MEDICAL CARE IF:   Symptoms get worse or do not improve in 2 weeks despite treatment.  New, unexplained symptoms develop (drugs used in treatment may produce side effects). EXERCISES RANGE OF MOTION (ROM) AND STRETCHING EXERCISES - Cervical Strain and Sprain These exercises may help you when beginning to rehabilitate your injury. In order to successfully resolve your symptoms, you must improve your posture. These exercises are designed to help reduce the forward-head and rounded-shoulder posture which contributes to this condition. Your symptoms may resolve with or without further involvement from your physician, physical therapist or athletic trainer. While completing these exercises, remember:   Restoring tissue flexibility helps normal motion to return to the joints. This allows healthier, less painful movement and activity.  An effective stretch should be held for at least 20 seconds, although you may need to begin with shorter hold times for comfort.  A stretch should never be painful. You should only feel a gentle lengthening or release in the stretched tissue. STRETCH- Axial Extensors  Lie on  your back on the floor. You may bend your knees for comfort. Place a rolled-up hand towel or dish towel, about 2 inches in diameter, under the part of your head that makes contact with the floor.  Gently tuck your chin, as if trying to make a "double chin," until you feel a gentle stretch at the base of your head.  Hold __________ seconds. Repeat __________ times. Complete this exercise __________ times per day.  STRETCH - Axial Extension   Stand or sit on a firm surface. Assume a good posture: chest up, shoulders drawn back, abdominal muscles slightly tense, knees unlocked (if standing) and feet hip width apart.  Slowly retract your chin so your head slides back and your chin slightly lowers. Continue to look straight ahead.  You should feel a gentle stretch in the back of your head. Be certain not to feel an aggressive stretch since this can cause headaches later.  Hold for __________ seconds. Repeat __________ times. Complete this exercise __________ times per day. STRETCH - Cervical Side Bend   Stand or sit on a firm surface. Assume a good posture: chest up, shoulders drawn back, abdominal muscles slightly tense, knees unlocked (if standing) and feet hip width apart.  Without letting your nose or shoulders move, slowly tip your right / left ear to your shoulder until your feel a gentle stretch in the muscles on the opposite side of your neck.  Hold __________ seconds. Repeat __________ times. Complete this exercise __________ times per day. STRETCH - Cervical Rotators   Stand or sit  on a firm surface. Assume a good posture: chest up, shoulders drawn back, abdominal muscles slightly tense, knees unlocked (if standing) and feet hip width apart.  Keeping your eyes level with the ground, slowly turn your head until you feel a gentle stretch along the back and opposite side of your neck.  Hold __________ seconds. Repeat __________ times. Complete this exercise __________ times per  day. RANGE OF MOTION - Neck Circles   Stand or sit on a firm surface. Assume a good posture: chest up, shoulders drawn back, abdominal muscles slightly tense, knees unlocked (if standing) and feet hip width apart.  Gently roll your head down and around from the back of one shoulder to the back of the other. The motion should never be forced or painful.  Repeat the motion 10-20 times, or until you feel the neck muscles relax and loosen. Repeat __________ times. Complete the exercise __________ times per day. STRENGTHENING EXERCISES - Cervical Strain and Sprain These exercises may help you when beginning to rehabilitate your injury. They may resolve your symptoms with or without further involvement from your physician, physical therapist, or athletic trainer. While completing these exercises, remember:   Muscles can gain both the endurance and the strength needed for everyday activities through controlled exercises.  Complete these exercises as instructed by your physician, physical therapist, or athletic trainer. Progress the resistance and repetitions only as guided.  You may experience muscle soreness or fatigue, but the pain or discomfort you are trying to eliminate should never worsen during these exercises. If this pain does worsen, stop and make certain you are following the directions exactly. If the pain is still present after adjustments, discontinue the exercise until you can discuss the trouble with your clinician. STRENGTH - Cervical Flexors, Isometric  Face a wall, standing about 6 inches away. Place a small pillow, a ball about 6-8 inches in diameter, or a folded towel between your forehead and the wall.  Slightly tuck your chin and gently push your forehead into the soft object. Push only with mild to moderate intensity, building up tension gradually. Keep your jaw and forehead relaxed.  Hold 10 to 20 seconds. Keep your breathing relaxed.  Release the tension slowly. Relax your  neck muscles completely before you start the next repetition. Repeat __________ times. Complete this exercise __________ times per day. STRENGTH- Cervical Lateral Flexors, Isometric   Stand about 6 inches away from a wall. Place a small pillow, a ball about 6-8 inches in diameter, or a folded towel between the side of your head and the wall.  Slightly tuck your chin and gently tilt your head into the soft object. Push only with mild to moderate intensity, building up tension gradually. Keep your jaw and forehead relaxed.  Hold 10 to 20 seconds. Keep your breathing relaxed.  Release the tension slowly. Relax your neck muscles completely before you start the next repetition. Repeat __________ times. Complete this exercise __________ times per day. STRENGTH - Cervical Extensors, Isometric   Stand about 6 inches away from a wall. Place a small pillow, a ball about 6-8 inches in diameter, or a folded towel between the back of your head and the wall.  Slightly tuck your chin and gently tilt your head back into the soft object. Push only with mild to moderate intensity, building up tension gradually. Keep your jaw and forehead relaxed.  Hold 10 to 20 seconds. Keep your breathing relaxed.  Release the tension slowly. Relax your neck muscles completely before  you start the next repetition. Repeat __________ times. Complete this exercise __________ times per day. POSTURE AND BODY MECHANICS CONSIDERATIONS - Cervical Strain and Sprain Keeping correct posture when sitting, standing or completing your activities will reduce the stress put on different body tissues, allowing injured tissues a chance to heal and limiting painful experiences. The following are general guidelines for improved posture. Your physician or physical therapist will provide you with any instructions specific to your needs. While reading these guidelines, remember:  The exercises prescribed by your provider will help you have the  flexibility and strength to maintain correct postures.  The correct posture provides the optimal environment for your joints to work. All of your joints have less wear and tear when properly supported by a spine with good posture. This means you will experience a healthier, less painful body.  Correct posture must be practiced with all of your activities, especially prolonged sitting and standing. Correct posture is as important when doing repetitive low-stress activities (typing) as it is when doing a single heavy-load activity (lifting). PROLONGED STANDING WHILE SLIGHTLY LEANING FORWARD When completing a task that requires you to lean forward while standing in one place for a long time, place either foot up on a stationary 2- to 4-inch high object to help maintain the best posture. When both feet are on the ground, the low back tends to lose its slight inward curve. If this curve flattens (or becomes too large), then the back and your other joints will experience too much stress, fatigue more quickly, and can cause pain.  RESTING POSITIONS Consider which positions are most painful for you when choosing a resting position. If you have pain with flexion-based activities (sitting, bending, stooping, squatting), choose a position that allows you to rest in a less flexed posture. You would want to avoid curling into a fetal position on your side. If your pain worsens with extension-based activities (prolonged standing, working overhead), avoid resting in an extended position such as sleeping on your stomach. Most people will find more comfort when they rest with their spine in a more neutral position, neither too rounded nor too arched. Lying on a non-sagging bed on your side with a pillow between your knees, or on your back with a pillow under your knees will often provide some relief. Keep in mind, being in any one position for a prolonged period of time, no matter how correct your posture, can still lead to  stiffness. WALKING Walk with an upright posture. Your ears, shoulders, and hips should all line up. OFFICE WORK When working at a desk, create an environment that supports good, upright posture. Without extra support, muscles fatigue and lead to excessive strain on joints and other tissues. CHAIR:  A chair should be able to slide under your desk when your back makes contact with the back of the chair. This allows you to work closely.  The chair's height should allow your eyes to be level with the upper part of your monitor and your hands to be slightly lower than your elbows.  Body position:  Your feet should make contact with the floor. If this is not possible, use a foot rest.  Keep your ears over your shoulders. This will reduce stress on your neck and low back. Document Released: 01/09/2005 Document Revised: 05/26/2013 Document Reviewed: 04/23/2008 [facility] Patient Information 2015 [place], [place]. This information is not intended to replace advice given to you by your health care provider. Make sure you discuss any questions you  have with your health care provider.   Hypertension Hypertension, commonly called high blood pressure, is when the force of blood pumping through your arteries is too strong. Your arteries are the blood vessels that carry blood from your heart throughout your body. A blood pressure reading consists of a higher number over a lower number, such as 110/72. The higher number (systolic) is the pressure inside your arteries when your heart pumps. The lower number (diastolic) is the pressure inside your arteries when your heart relaxes. Ideally you want your blood pressure below 120/80. Hypertension forces your heart to work harder to pump blood. Your arteries may become narrow or stiff. Having hypertension puts you at risk for heart disease, stroke, and other problems.  RISK FACTORS Some risk factors for high blood pressure are controllable. Others are not.  Risk  factors you cannot control include:   Race. You may be at higher risk if you are African American.  Age. Risk increases with age.  Gender. Men are at higher risk than women before age 60 years. After age 107, women are at higher risk than men. Risk factors you can control include:  Not getting enough exercise or physical activity.  Being overweight.  Getting too much fat, sugar, calories, or salt in your diet.  Drinking too much alcohol. SIGNS AND SYMPTOMS Hypertension does not usually cause signs or symptoms. Extremely high blood pressure (hypertensive crisis) may cause headache, anxiety, shortness of breath, and nosebleed. DIAGNOSIS  To check if you have hypertension, your health care provider will measure your blood pressure while you are seated, with your arm held at the level of your heart. It should be measured at least twice using the same arm. Certain conditions can cause a difference in blood pressure between your right and left arms. A blood pressure reading that is higher than normal on one occasion does not mean that you need treatment. If one blood pressure reading is high, ask your health care provider about having it checked again. TREATMENT  Treating high blood pressure includes making lifestyle changes and possibly taking medicine. Living a healthy lifestyle can help lower high blood pressure. You may need to change some of your habits. Lifestyle changes may include:  Following the DASH diet. This diet is high in fruits, vegetables, and whole grains. It is low in salt, red meat, and added sugars.  Getting at least 2 hours of brisk physical activity every week.  Losing weight if necessary.  Not smoking.  Limiting alcoholic beverages.  Learning ways to reduce stress. If lifestyle changes are not enough to get your blood pressure under control, your health care provider may prescribe medicine. You may need to take more than one. Work closely with your health care  provider to understand the risks and benefits. HOME CARE INSTRUCTIONS  Have your blood pressure rechecked as directed by your health care provider.   Take medicines only as directed by your health care provider. Follow the directions carefully. Blood pressure medicines must be taken as prescribed. The medicine does not work as well when you skip doses. Skipping doses also puts you at risk for problems.   Do not smoke.   Monitor your blood pressure at home as directed by your health care provider. SEEK MEDICAL CARE IF:   You think you are having a reaction to medicines taken.  You have recurrent headaches or feel dizzy.  You have swelling in your ankles.  You have trouble with your vision. SEEK IMMEDIATE MEDICAL CARE  IF:  You develop a severe headache or confusion.  You have unusual weakness, numbness, or feel faint.  You have severe chest or abdominal pain.  You vomit repeatedly.  You have trouble breathing. MAKE SURE YOU:   Understand these instructions.  Will watch your condition.  Will get help right away if you are not doing well or get worse. Document Released: 01/09/2005 Document Revised: 05/26/2013 Document Reviewed: 11/01/2012 [facility] Patient Information 2015 [place], [place]. This information is not intended to replace advice given to you by your health care provider. Make sure you discuss any questions you have with your health care provider.

## 2014-10-03 NOTE — ED Notes (Signed)
Assessment per Dr. Corey. 

## 2014-10-03 NOTE — ED Provider Notes (Signed)
Daniel [name] is a 44 y.o. male who presents to [facility]  today for neck pain. Patient notes acute onset of left-sided neck pain starting yesterday without injury. No radiating pain weakness or numbness fevers or chills. He's tried some aspirin which has not helped very much.  Additionally patient notes a history of hypertension. He does not have a doctor or health insurance and has run out of his lisinopril.  He denies any chest pain or palpitations or shortness of breath.   Past Medical History  Diagnosis Date  . Hypertension   . Asthma    History reviewed. No pertinent past surgical history. Social History  Substance Use Topics  . Smoking status: Current Some Day Smoker -- 0.50 packs/day    Types: Cigarettes  . Smokeless tobacco: Not on file  . Alcohol Use: Yes     Comment: occasional   family history is not on file.  ROS as above Medications: No current facility-administered medications for this encounter.   Current Outpatient Prescriptions  Medication Sig Dispense Refill  . ALBUTEROL IN Inhale into the lungs.    . cyclobenzaprine (FLEXERIL) 10 MG tablet Take 1 tablet (10 mg total) by mouth at bedtime as needed for muscle spasms. 20 tablet 0  . lisinopril (PRINIVIL,ZESTRIL) 10 MG tablet Take 1 tablet (10 mg total) by mouth daily. 30 tablet 0  . naproxen (EC-NAPROSYN) 500 MG EC tablet Take 1 tablet (500 mg total) by mouth 2 (two) times daily with a meal. 20 tablet 0   No Known Allergies   Exam:  BP 154/100 mmHg  Pulse 82  Temp(Src) 98.6 F (37 C) (Oral)  Resp 18  SpO2 100% Gen: Well NAD HEENT: EOMI,  MMM Lungs: Normal work of breathing. CTABL Heart: RRR no MRG Abd: NABS, Soft. Nondistended, Nontender Exts: Brisk capillary refill, warm and well perfused.  Neck: Tender to palpation left cervical paraspinal and sternocleidomastoid and trapezius. Nontender to spinal midline. Decreased cervical range of motion due to pain. Neck slight upper extremity  strength is equal and normal bilaterally as are reflexes. Sensation is intact throughout.  Results for orders placed or performed during the hospital encounter of 10/03/14 (from the past 24 hour(s))  I-STAT, chem 8     Status: Abnormal   Collection Time: 10/03/14  3:49 PM  Result Value Ref Range   Sodium 139 135 - 145 mmol/L   Potassium 3.8 3.5 - 5.1 mmol/L   Chloride 98 (L) 101 - 111 mmol/L   BUN 6 6 - 20 mg/dL   Creatinine, Ser 1.61 0.61 - 1.24 mg/dL   Glucose, Bld 99 65 - 99 mg/dL   Calcium, Ion 0.96 0.45 - 1.23 mmol/L   TCO2 29 0 - 100 mmol/L   Hemoglobin 14.6 13.0 - 17.0 g/dL   HCT 40.9 81.1 - 91.4 %   No results found.   44 y.o. male presents to urgent care today for:  1) neck pain: Likely cervical myofascial strain and spasm. Treat with home exercises heating pad naproxen and Flexeril. 2) blood pressure: Restart lisinopril. Follow up with [facility].      Rodolph Bong, MD 10/03/14 [phone]

## 2016-01-15 IMAGING — CR DG KNEE COMPLETE 4+V*R*
4 series · 4 of 4 positions shown · non-contrast
Comparison: 08/07/2013.

CLINICAL DATA: Swelling.

EXAM:
RIGHT KNEE - COMPLETE 4+ VIEW

[t knee ap right]
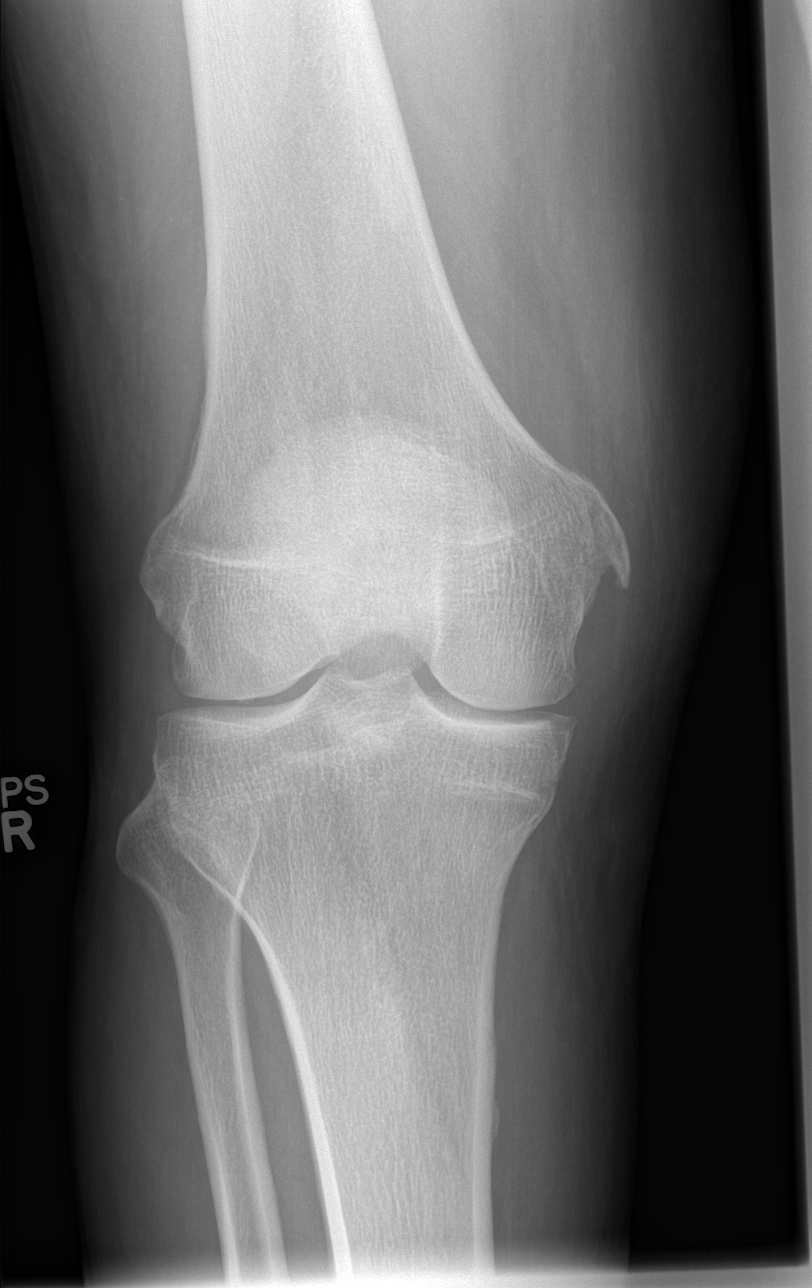

[t knee oblique right (1 of 2)]
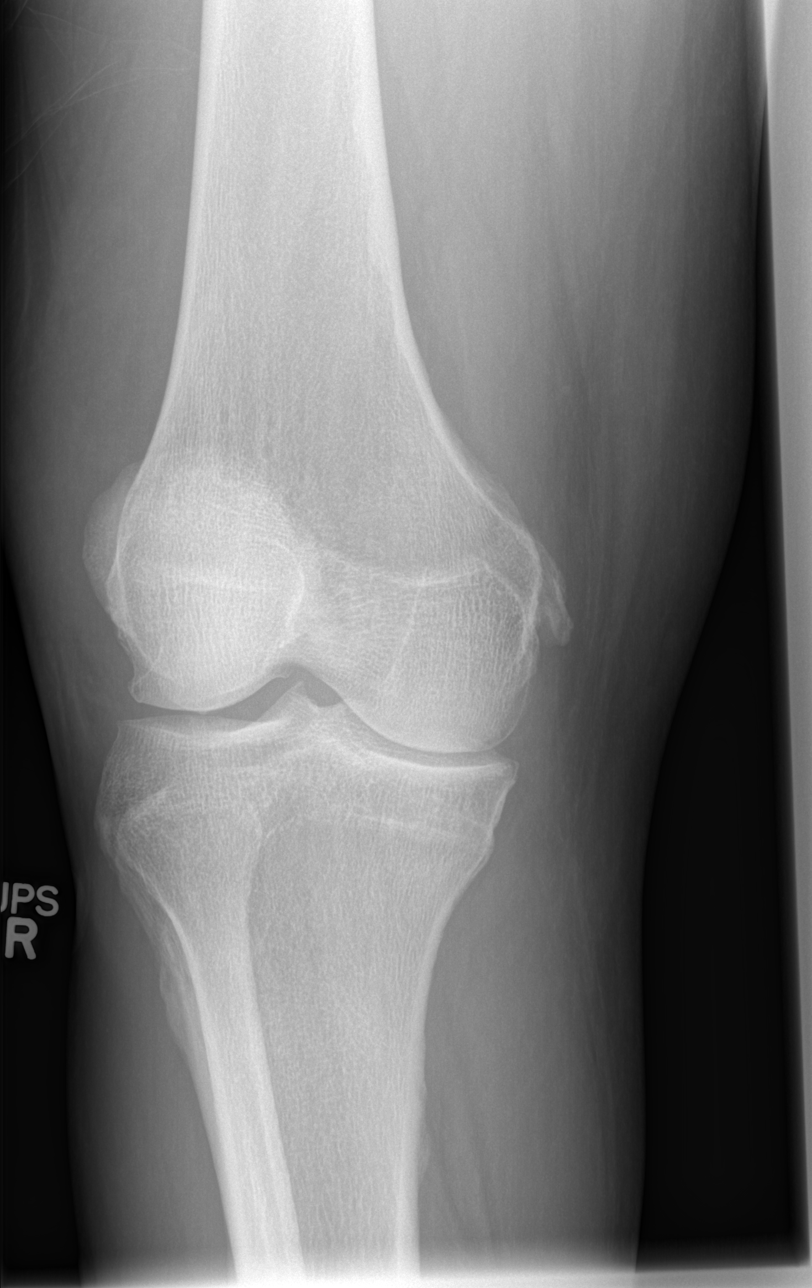

[t knee oblique right (2 of 2)]
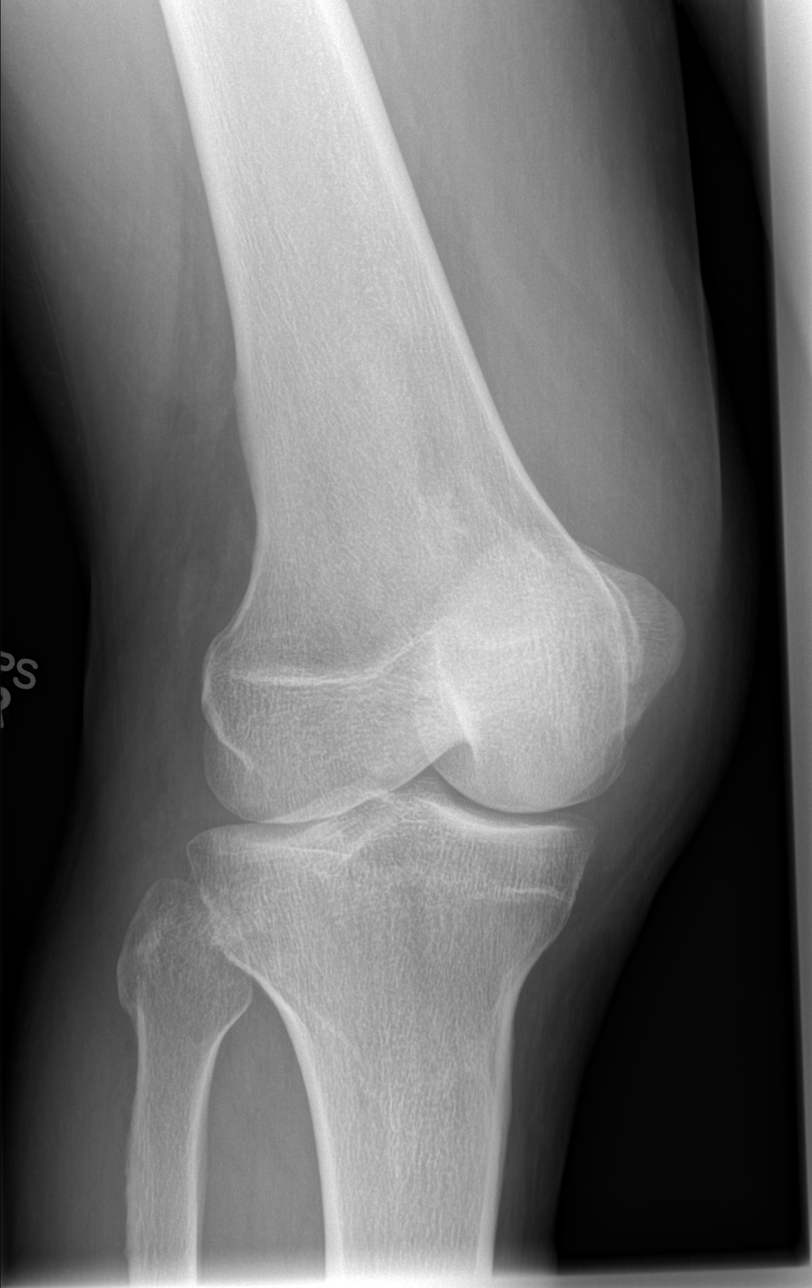

[t knee lat right]
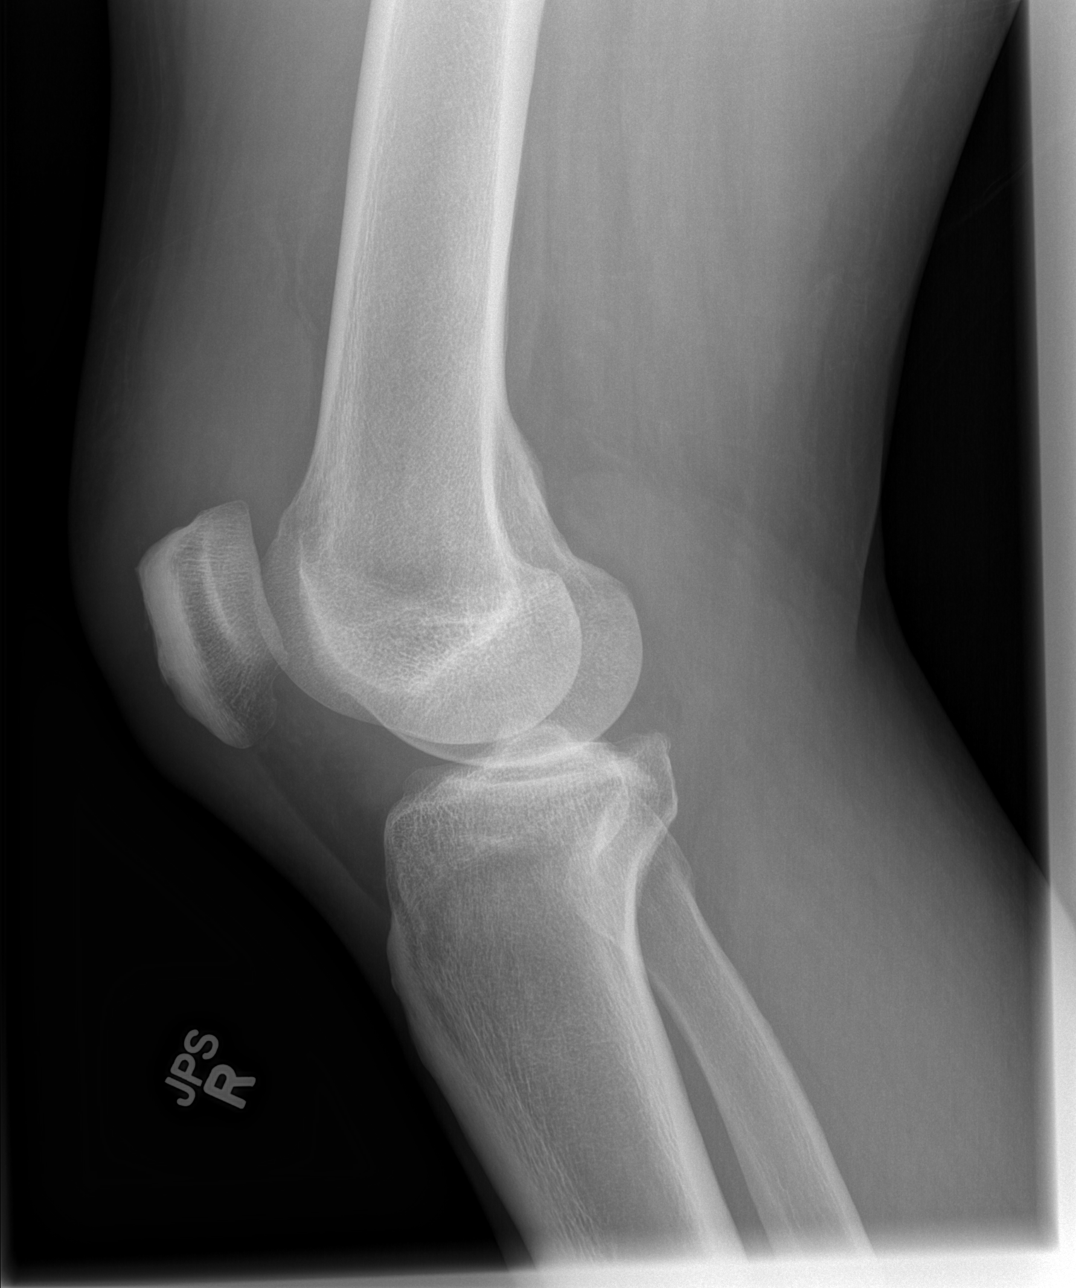

[4 of 4 positions shown; findings below may reference images not displayed]

FINDINGS: Prominent knee joint effusion. Mild tract of degenerative change.
Spurring noted of the medial femoral condyles. No evidence of
fracture or dislocation.
IMPRESSION: 1. Prominent knee joint effusion.
2. No acute bony or joint abnormality. Mild tricompartment
degenerative changes.

## 2016-05-21 ENCOUNTER — Emergency Department (HOSPITAL_COMMUNITY)
Admission: EM | Admit: 2016-05-21 | Discharge: 2016-05-21 | Disposition: A | Discharge status: Home / Self Care | Payer: No Typology Code available for payment source | Attending: Emergency Medicine | Admitting: Emergency Medicine

## 2016-05-21 ENCOUNTER — Encounter (HOSPITAL_COMMUNITY): Payer: Self-pay | Admitting: Emergency Medicine

## 2016-05-21 DIAGNOSIS — S199XXA Unspecified injury of neck, initial encounter: Secondary | ICD-10-CM | POA: Diagnosis present

## 2016-05-21 DIAGNOSIS — F17210 Nicotine dependence, cigarettes, uncomplicated: Secondary | ICD-10-CM | POA: Insufficient documentation

## 2016-05-21 DIAGNOSIS — Y92410 Unspecified street and highway as the place of occurrence of the external cause: Secondary | ICD-10-CM | POA: Insufficient documentation

## 2016-05-21 DIAGNOSIS — M542 Cervicalgia: Secondary | ICD-10-CM | POA: Diagnosis not present

## 2016-05-21 DIAGNOSIS — Z79899 Other long term (current) drug therapy: Secondary | ICD-10-CM | POA: Diagnosis not present

## 2016-05-21 DIAGNOSIS — M545 Low back pain: Secondary | ICD-10-CM | POA: Diagnosis not present

## 2016-05-21 DIAGNOSIS — J45909 Unspecified asthma, uncomplicated: Secondary | ICD-10-CM | POA: Diagnosis not present

## 2016-05-21 DIAGNOSIS — Y999 Unspecified external cause status: Secondary | ICD-10-CM | POA: Diagnosis not present

## 2016-05-21 DIAGNOSIS — Y939 Activity, unspecified: Secondary | ICD-10-CM | POA: Insufficient documentation

## 2016-05-21 DIAGNOSIS — I10 Essential (primary) hypertension: Secondary | ICD-10-CM | POA: Diagnosis not present

## 2016-05-21 MED ORDER — NAPROXEN 500 MG PO TBEC: 500.0000 mg | DELAYED_RELEASE_TABLET | Freq: Two times a day (BID) | ORAL | 0 refills | Status: DC

## 2016-05-21 MED ORDER — LISINOPRIL 10 MG PO TABS: 10.0000 mg | ORAL_TABLET | Freq: Every day | ORAL | 0 refills | Status: DC

## 2016-05-21 MED ORDER — CYCLOBENZAPRINE HCL 10 MG PO TABS: 10.0000 mg | ORAL_TABLET | Freq: Every evening | ORAL | 0 refills | Status: DC | PRN

## 2016-05-21 NOTE — ED Notes (Signed)
Pt states he was on Lisinopril in the past, Lisinopril managed blood pressure well. Denies renal compromise, angioedema, or other adverse reactions with Lisinopril.
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
## 2016-12-07 ENCOUNTER — Emergency Department (HOSPITAL_COMMUNITY)
Admission: EM | Admit: 2016-12-07 | Discharge: 2016-12-07 | Disposition: A | Discharge status: Home / Self Care | Payer: Self-pay | Attending: Emergency Medicine | Admitting: Emergency Medicine

## 2016-12-07 ENCOUNTER — Other Ambulatory Visit: Payer: Self-pay

## 2016-12-07 DIAGNOSIS — J45909 Unspecified asthma, uncomplicated: Secondary | ICD-10-CM | POA: Insufficient documentation

## 2016-12-07 DIAGNOSIS — Z79899 Other long term (current) drug therapy: Secondary | ICD-10-CM | POA: Insufficient documentation

## 2016-12-07 DIAGNOSIS — I10 Essential (primary) hypertension: Secondary | ICD-10-CM | POA: Insufficient documentation

## 2016-12-07 DIAGNOSIS — F17210 Nicotine dependence, cigarettes, uncomplicated: Secondary | ICD-10-CM | POA: Insufficient documentation

## 2016-12-07 DIAGNOSIS — R040 Epistaxis: Secondary | ICD-10-CM | POA: Insufficient documentation

## 2016-12-07 MED ORDER — OXYMETAZOLINE HCL 0.05 % NA SOLN: 1.0000 | Freq: Two times a day (BID) | NASAL | 0 refills | Status: DC

## 2016-12-07 MED ORDER — LISINOPRIL 10 MG PO TABS: 10.0000 mg | ORAL_TABLET | Freq: Every day | ORAL | 1 refills | Status: DC

## 2016-12-07 MED ORDER — LISINOPRIL 10 MG PO TABS
10.0000 mg | ORAL_TABLET | Freq: Once | ORAL | Status: AC
  Administered 2016-12-07: 10 mg via ORAL
  Filled 2016-12-07: qty 1

## 2016-12-07 NOTE — Discharge Instructions (Signed)
Restart your blood pressure medications on a daily basis.  Try to take around the same time every day. If recurrent nosebleed occurs, recommend to: 1.  Blow your nose completely. 2.  1 spray in each nare (that is bleeding). 3.  Hold pressure for at least 10-15 mins.  Do not use the afrin for more than 3 consecutive days. Follow-up with the wellness clinic for close monitoring of your blood pressure (this is a free clinic!). Return to the ED for new or worsening symptoms.

## 2016-12-07 NOTE — ED Triage Notes (Signed)
Per EMS pt reports nose bleed off and on x 2 days, reports bloody nose 4 x today, several large lots, has had 2 sprays of nasal spray to R&L nare. Bleeding has slowed since. Hx of HTN and has been unable to take medications. 220/130 manual per EMS.

## 2016-12-07 NOTE — ED Provider Notes (Signed)
[facility] Provider Note   CSN: [id number] Arrival date & time: 12/07/16  1954     History   Chief Complaint Chief Complaint  Patient presents with  . Epistaxis  . Hypertension    HPI Daniel [name] is a 46 y.o. male.  The history is provided by the patient and medical records.    [age] y.o M with hx of asthma and HTN, presenting to the ED for nosebleeds.  States for the past 2 days he has been having intermittent nosebleeds.  States has occurred about 4x today.  States he will have some clots in the nose and then it will start dripping.  States he has been able to stop them with direct pressure.  No trauma to the face.  Not in anticoagulation.  No fever, chills, or recent illness.  No issues with nosebleeds in "years" according to patient.  He is hypertensive here-- states he ran out of his medication several weeks ago.  Denies headaches, dizziness, weakness, numbness, chest pain or SOB.  Past Medical History:  Diagnosis Date  . Asthma   . Hypertension     Patient Active Problem List   Diagnosis Date Noted  . Alcohol dependence (HCC) 09/05/2012  . 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     Home Medications    Prior to Admission medications   Medication Sig Start Date End Date Taking? Authorizing Provider  ALBUTEROL IN Inhale into the lungs.    [provider]  cyclobenzaprine (FLEXERIL) 10 MG tablet Take 1 tablet (10 mg total) by mouth at bedtime as needed for muscle spasms. 05/21/16   Fayrene Helperran, [place], PA-C  lisinopril (PRINIVIL,ZESTRIL) 10 MG tablet Take 1 tablet (10 mg total) by mouth daily. 05/21/16   Fayrene Helperran, [place], PA-C  naproxen (EC-NAPROSYN) 500 MG EC tablet Take 1 tablet (500 mg total) by mouth 2 (two) times daily with a meal. 05/21/16   Fayrene Helperran, [place], PA-C    Family History No family history on file.  Social History Social History     Tobacco Use  . Smoking status: Current Some Day Smoker    Packs/day: 0.50    Types: Cigarettes  Substance Use Topics  . Alcohol use: Yes    Comment: occasional  . Drug use: No     Allergies   Patient has no known allergies.   Review of Systems Review of Systems  HENT: Positive for nosebleeds.   All other systems reviewed and are negative.    Physical Exam Updated Vital Signs BP (!) 161/113   Pulse 84   Temp 98 F (36.7 C) (Oral)   Resp 16   Ht 5\' 7"  (1.702 m)   Wt 93 kg (205 lb)   SpO2 99%   BMI 32.11 kg/m   Physical Exam  Constitutional: He is oriented to person, place, and time. He appears well-developed and well-nourished.  HENT:  Head: Normocephalic and atraumatic.  Mouth/Throat: Oropharynx is clear and moist.  Dried blood in both nares; no active bleeding; no septal deformity or hematoma; mild mucosal edema; no signs of trauma  Eyes: Conjunctivae and EOM are normal. Pupils are equal, round, and reactive to light.  Neck: Normal range of motion.  Cardiovascular: Normal rate, regular rhythm and normal heart sounds.  Pulmonary/Chest: Effort normal and breath sounds normal.  Abdominal: Soft. Bowel sounds are normal.  Musculoskeletal: Normal range of motion.  Neurological:  He is alert and oriented to person, place, and time.  Skin: Skin is warm and dry.  Psychiatric: He has a normal mood and affect.  Nursing note and vitals reviewed.    ED Treatments / Results  Labs (all labs ordered are listed, but only abnormal results are displayed) Labs Reviewed - No data to display  EKG  EKG Interpretation None       Radiology No results found.  Procedures Procedures (including critical care time)  Medications Ordered in ED Medications - No data to display   Initial Impression / Assessment and Plan / ED Course  I have reviewed the triage vital signs and the nursing notes.  Pertinent labs & imaging results that were available during my care of the  patient were reviewed by me and considered in my medical decision making (see chart for details).  8:14 PM Patient assessed.  No active bleeding at this time.  No signs of trauma, septal deformity or hematoma.  BP high but not within critical range-- has been out of meds for a few weeks. No headaches, chest pain, SOB, dizziness, weakness, etc.  HTN may be contributing to bleeding in addition to cold weather.  Will monitor here, give home dose of meds.  10:09 PM Patient has been observed for nearly 2 hours without recurrence of bleed.  After home meds, BP improved.  Patient asymptomatic in regards to BP and I do not suspect acute end organ damage.  Feel he can be safely discharged.  We have discussed use of afrin nasal spray at home but for no more than 3 consecutive days.  Also discussed other supportive measures.  I have refilled his home BP meds but encouraged him to follow-up closely with PCP.  Final Clinical Impressions(s) / ED Diagnoses   Final diagnoses:  Essential hypertension  Epistaxis    ED Discharge Orders        Ordered    lisinopril (PRINIVIL,ZESTRIL) 10 MG tablet  Daily     12/07/16 2212    oxymetazoline (AFRIN NASAL SPRAY) 0.05 % nasal spray  2 times daily     12/07/16 2212       Garlon HatchetSanders, Kalif Kattner M, PA-C 12/07/16 2238    Alvira MondaySchlossman, Erin, MD 12/10/16 1754

## 2017-08-15 ENCOUNTER — Emergency Department (HOSPITAL_COMMUNITY): Payer: Self-pay

## 2017-08-15 ENCOUNTER — Other Ambulatory Visit: Payer: Self-pay

## 2017-08-15 ENCOUNTER — Emergency Department (HOSPITAL_COMMUNITY)
Admission: EM | Admit: 2017-08-15 | Discharge: 2017-08-15 | Disposition: A | Discharge status: Home / Self Care | Payer: Self-pay | Attending: Emergency Medicine | Admitting: Emergency Medicine

## 2017-08-15 ENCOUNTER — Encounter (HOSPITAL_COMMUNITY): Payer: Self-pay | Admitting: *Deleted

## 2017-08-15 DIAGNOSIS — Y999 Unspecified external cause status: Secondary | ICD-10-CM | POA: Insufficient documentation

## 2017-08-15 DIAGNOSIS — T148XXA Other injury of unspecified body region, initial encounter: Secondary | ICD-10-CM

## 2017-08-15 DIAGNOSIS — Z23 Encounter for immunization: Secondary | ICD-10-CM | POA: Insufficient documentation

## 2017-08-15 DIAGNOSIS — Y929 Unspecified place or not applicable: Secondary | ICD-10-CM | POA: Insufficient documentation

## 2017-08-15 DIAGNOSIS — S41032A Puncture wound without foreign body of left shoulder, initial encounter: Secondary | ICD-10-CM | POA: Insufficient documentation

## 2017-08-15 DIAGNOSIS — Y939 Activity, unspecified: Secondary | ICD-10-CM | POA: Insufficient documentation

## 2017-08-15 DIAGNOSIS — S2191XA Laceration without foreign body of unspecified part of thorax, initial encounter: Secondary | ICD-10-CM

## 2017-08-15 DIAGNOSIS — S21119A Laceration without foreign body of unspecified front wall of thorax without penetration into thoracic cavity, initial encounter: Secondary | ICD-10-CM

## 2017-08-15 DIAGNOSIS — J45909 Unspecified asthma, uncomplicated: Secondary | ICD-10-CM | POA: Insufficient documentation

## 2017-08-15 DIAGNOSIS — F17200 Nicotine dependence, unspecified, uncomplicated: Secondary | ICD-10-CM | POA: Insufficient documentation

## 2017-08-15 DIAGNOSIS — S3991XA Unspecified injury of abdomen, initial encounter: Secondary | ICD-10-CM | POA: Insufficient documentation

## 2017-08-15 DIAGNOSIS — I10 Essential (primary) hypertension: Secondary | ICD-10-CM | POA: Insufficient documentation

## 2017-08-15 DIAGNOSIS — S21132A Puncture wound without foreign body of left front wall of thorax without penetration into thoracic cavity, initial encounter: Secondary | ICD-10-CM | POA: Insufficient documentation

## 2017-08-15 HISTORY — DX: Unspecified asthma, uncomplicated: J45.909

## 2017-08-15 LAB — PREPARE FRESH FROZEN PLASMA
UNIT DIVISION: 0
Unit division: 0

## 2017-08-15 LAB — BPAM FFP
BLOOD PRODUCT EXPIRATION DATE: 201908162359
Blood Product Expiration Date: 201908172359
ISSUE DATE / TIME: 201907242204
ISSUE DATE / TIME: 201907242204
UNIT TYPE AND RH: 600
Unit Type and Rh: 6200

## 2017-08-15 MED ORDER — LIDOCAINE-EPINEPHRINE 1 %-1:100000 IJ SOLN
10.0000 mL | Freq: Once | INTRAMUSCULAR | Status: DC
  Filled 2017-08-15 (×2): qty 10

## 2017-08-15 MED ORDER — TETANUS-DIPHTH-ACELL PERTUSSIS 5-2.5-18.5 LF-MCG/0.5 IM SUSP
0.5000 mL | Freq: Once | INTRAMUSCULAR | Status: AC
  Administered 2017-08-15: 0.5 mL via INTRAMUSCULAR
  Filled 2017-08-15: qty 0.5

## 2017-08-15 MED ORDER — TETANUS-DIPHTH-ACELL PERTUSSIS 5-2.5-18.5 LF-MCG/0.5 IM SUSP: 0.5000 mL | Freq: Once | INTRAMUSCULAR | Status: DC

## 2017-08-15 MED ORDER — IOHEXOL 300 MG/ML  SOLN
100.0000 mL | Freq: Once | INTRAMUSCULAR | Status: AC | PRN
  Administered 2017-08-15: 100 mL via INTRAVENOUS
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
## 2017-08-16 ENCOUNTER — Encounter (HOSPITAL_COMMUNITY): Payer: Self-pay | Admitting: Emergency Medicine

## 2017-08-16 LAB — BPAM RBC
BLOOD PRODUCT EXPIRATION DATE: 201908082359
BLOOD PRODUCT EXPIRATION DATE: 201908082359
ISSUE DATE / TIME: 201907242203
ISSUE DATE / TIME: 201907242203
UNIT TYPE AND RH: 9500
Unit Type and Rh: 9500

## 2017-08-29 ENCOUNTER — Encounter (HOSPITAL_COMMUNITY): Payer: Self-pay | Admitting: Emergency Medicine

## 2017-08-29 ENCOUNTER — Emergency Department (HOSPITAL_COMMUNITY)
Admission: EM | Admit: 2017-08-29 | Discharge: 2017-08-29 | Disposition: A | Discharge status: Home / Self Care | Payer: Self-pay | Attending: Emergency Medicine | Admitting: Emergency Medicine

## 2017-08-29 ENCOUNTER — Other Ambulatory Visit: Payer: Self-pay

## 2017-08-29 DIAGNOSIS — Z4802 Encounter for removal of sutures: Secondary | ICD-10-CM | POA: Insufficient documentation
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
## 2017-08-29 NOTE — Discharge Instructions (Addendum)
You are seen in the emergency department today to have your staples removed.  2 staples were removed from the left side of your back.  Please keep this area covered when you are in the sun to minimize scarring.  Return to the ER for new or worsening symptoms including but not limited to redness around the wound, drainage from that area, separation of the wound, fevers, or any other concerns.  Have your blood pressure rechecked by primary care provider within 1 month as it was elevated in the emergency department today. Vitals:   08/29/17 1927  BP: (!) 129/101  Pulse: 83  Resp: 16  Temp: (!) 97.5 F (36.4 C)  SpO2: 99%

## 2017-08-29 NOTE — ED Notes (Signed)
Declined W/C at D/C and was escorted to lobby by RN. 

## 2017-12-10 ENCOUNTER — Ambulatory Visit: Payer: Self-pay | Admitting: Family Medicine

## 2018-03-01 ENCOUNTER — Encounter (HOSPITAL_COMMUNITY): Payer: Self-pay

## 2018-03-01 ENCOUNTER — Emergency Department (HOSPITAL_COMMUNITY): Payer: Self-pay

## 2018-03-01 ENCOUNTER — Inpatient Hospital Stay (HOSPITAL_COMMUNITY)
Admission: EM | Admit: 2018-03-01 | Discharge: 2018-03-04 | DRG: 175 | Disposition: A | Discharge status: Home / Self Care | Payer: Self-pay | Attending: Internal Medicine | Admitting: Internal Medicine

## 2018-03-01 DIAGNOSIS — A419 Sepsis, unspecified organism: Secondary | ICD-10-CM

## 2018-03-01 DIAGNOSIS — I2693 Single subsegmental pulmonary embolism without acute cor pulmonale: Principal | ICD-10-CM | POA: Diagnosis present

## 2018-03-01 DIAGNOSIS — D6959 Other secondary thrombocytopenia: Secondary | ICD-10-CM | POA: Diagnosis present

## 2018-03-01 DIAGNOSIS — E669 Obesity, unspecified: Secondary | ICD-10-CM | POA: Diagnosis present

## 2018-03-01 DIAGNOSIS — R509 Fever, unspecified: Secondary | ICD-10-CM | POA: Diagnosis present

## 2018-03-01 DIAGNOSIS — J9601 Acute respiratory failure with hypoxia: Secondary | ICD-10-CM | POA: Diagnosis present

## 2018-03-01 DIAGNOSIS — F17210 Nicotine dependence, cigarettes, uncomplicated: Secondary | ICD-10-CM | POA: Diagnosis present

## 2018-03-01 DIAGNOSIS — J45901 Unspecified asthma with (acute) exacerbation: Secondary | ICD-10-CM | POA: Diagnosis present

## 2018-03-01 DIAGNOSIS — J690 Pneumonitis due to inhalation of food and vomit: Secondary | ICD-10-CM

## 2018-03-01 DIAGNOSIS — Z8249 Family history of ischemic heart disease and other diseases of the circulatory system: Secondary | ICD-10-CM

## 2018-03-01 DIAGNOSIS — I2699 Other pulmonary embolism without acute cor pulmonale: Secondary | ICD-10-CM | POA: Diagnosis present

## 2018-03-01 DIAGNOSIS — F1010 Alcohol abuse, uncomplicated: Secondary | ICD-10-CM | POA: Diagnosis present

## 2018-03-01 DIAGNOSIS — K7010 Alcoholic hepatitis without ascites: Secondary | ICD-10-CM | POA: Diagnosis present

## 2018-03-01 DIAGNOSIS — J111 Influenza due to unidentified influenza virus with other respiratory manifestations: Secondary | ICD-10-CM

## 2018-03-01 DIAGNOSIS — I10 Essential (primary) hypertension: Secondary | ICD-10-CM | POA: Diagnosis present

## 2018-03-01 DIAGNOSIS — D649 Anemia, unspecified: Secondary | ICD-10-CM | POA: Diagnosis present

## 2018-03-01 DIAGNOSIS — Z6834 Body mass index (BMI) 34.0-34.9, adult: Secondary | ICD-10-CM

## 2018-03-01 DIAGNOSIS — R69 Illness, unspecified: Secondary | ICD-10-CM

## 2018-03-01 DIAGNOSIS — J45909 Unspecified asthma, uncomplicated: Secondary | ICD-10-CM | POA: Insufficient documentation

## 2018-03-01 DIAGNOSIS — Z720 Tobacco use: Secondary | ICD-10-CM | POA: Diagnosis present

## 2018-03-01 DIAGNOSIS — D696 Thrombocytopenia, unspecified: Secondary | ICD-10-CM | POA: Diagnosis present

## 2018-03-01 DIAGNOSIS — F10239 Alcohol dependence with withdrawal, unspecified: Secondary | ICD-10-CM | POA: Diagnosis present

## 2018-03-01 LAB — PROTIME-INR
INR: 1.01
Prothrombin Time: 13.2 seconds (ref 11.4–15.2)

## 2018-03-01 LAB — COMPREHENSIVE METABOLIC PANEL
ALBUMIN: 4.1 g/dL (ref 3.5–5.0)
ALT: 50 U/L — ABNORMAL HIGH (ref 0–44)
AST: 105 U/L — ABNORMAL HIGH (ref 15–41)
Alkaline Phosphatase: 84 U/L (ref 38–126)
Anion gap: 20 — ABNORMAL HIGH (ref 5–15)
BILIRUBIN TOTAL: 1.8 mg/dL — AB (ref 0.3–1.2)
BUN: 12 mg/dL (ref 6–20)
CO2: 22 mmol/L (ref 22–32)
Calcium: 8.7 mg/dL — ABNORMAL LOW (ref 8.9–10.3)
Chloride: 94 mmol/L — ABNORMAL LOW (ref 98–111)
Creatinine, Ser: 1.05 mg/dL (ref 0.61–1.24)
GFR calc Af Amer: >60 mL/min (ref >60)
GFR calc non Af Amer: >60 mL/min (ref >60)
Glucose, Bld: 98 mg/dL (ref 70–99)
POTASSIUM: 4 mmol/L (ref 3.5–5.1)
Sodium: 136 mmol/L (ref 135–145)
Total Protein: 7.6 g/dL (ref 6.5–8.1)

## 2018-03-01 LAB — CBC WITH DIFFERENTIAL/PLATELET
Abs Immature Granulocytes: 0.07 10*3/uL (ref 0.00–0.07)
Basophils Absolute: 0 10*3/uL (ref 0.0–0.1)
Basophils Relative: 1 %
EOS ABS: 0.2 10*3/uL (ref 0.0–0.5)
Eosinophils Relative: 3 %
HEMATOCRIT: 34.3 % — AB (ref 39.0–52.0)
Hemoglobin: 11.8 g/dL — ABNORMAL LOW (ref 13.0–17.0)
Immature Granulocytes: 1 %
Lymphocytes Relative: 10 %
Lymphs Abs: 0.7 10*3/uL (ref 0.7–4.0)
MCH: 31.6 pg (ref 26.0–34.0)
MCHC: 34.4 g/dL (ref 30.0–36.0)
MCV: 91.7 fL (ref 80.0–100.0)
MONOS PCT: 13 %
Monocytes Absolute: 0.9 10*3/uL (ref 0.1–1.0)
NEUTROS PCT: 72 %
Neutro Abs: 5 10*3/uL (ref 1.7–7.7)
PLATELETS: 104 10*3/uL — AB (ref 150–400)
RBC: 3.74 MIL/uL — ABNORMAL LOW (ref 4.22–5.81)
RDW: 17 % — ABNORMAL HIGH (ref 11.5–15.5)
WBC: 6.9 10*3/uL (ref 4.0–10.5)
nRBC: 0 % (ref 0.0–0.2)

## 2018-03-01 LAB — EXPECTORATED SPUTUM ASSESSMENT W GRAM STAIN, RFLX TO RESP C

## 2018-03-01 LAB — TROPONIN I
Troponin I: <0.03 ng/mL (ref <0.03)
Troponin I: <0.03 ng/mL (ref <0.03)

## 2018-03-01 LAB — ETHANOL: Alcohol, Ethyl (B): <10 mg/dL (ref <10)

## 2018-03-01 LAB — LACTIC ACID, PLASMA
Lactic Acid, Venous: 1.7 mmol/L (ref 0.5–1.9)
Lactic Acid, Venous: 1.9 mmol/L (ref 0.5–1.9)

## 2018-03-01 LAB — PROCALCITONIN: Procalcitonin: 0.2 ng/mL

## 2018-03-01 LAB — INFLUENZA PANEL BY PCR (TYPE A & B)
Influenza A By PCR: NEGATIVE
Influenza B By PCR: NEGATIVE

## 2018-03-01 LAB — BRAIN NATRIURETIC PEPTIDE: B Natriuretic Peptide: 63.4 pg/mL (ref 0.0–100.0)

## 2018-03-01 LAB — EXPECTORATED SPUTUM ASSESSMENT W REFEX TO RESP CULTURE

## 2018-03-01 MED ORDER — LORAZEPAM 1 MG PO TABS: 0.0000 mg | ORAL_TABLET | Freq: Two times a day (BID) | ORAL | Status: DC

## 2018-03-01 MED ORDER — IPRATROPIUM BROMIDE 0.02 % IN SOLN
0.5000 mg | Freq: Four times a day (QID) | RESPIRATORY_TRACT | Status: DC
  Administered 2018-03-01 – 2018-03-03 (×7): 0.5 mg via RESPIRATORY_TRACT
  Filled 2018-03-01 (×8): qty 2.5

## 2018-03-01 MED ORDER — IPRATROPIUM-ALBUTEROL 0.5-2.5 (3) MG/3ML IN SOLN
3.0000 mL | Freq: Once | RESPIRATORY_TRACT | Status: AC
Start: 2018-03-01 — End: 2018-03-01
  Administered 2018-03-01: 3 mL via RESPIRATORY_TRACT
  Filled 2018-03-01: qty 3

## 2018-03-01 MED ORDER — NICOTINE 21 MG/24HR TD PT24
21.0000 mg | MEDICATED_PATCH | Freq: Every day | TRANSDERMAL | Status: DC
  Administered 2018-03-01 – 2018-03-04 (×4): 21 mg via TRANSDERMAL
  Filled 2018-03-01 (×4): qty 1

## 2018-03-01 MED ORDER — SODIUM CHLORIDE 0.9 % IV SOLN
3.0000 g | Freq: Three times a day (TID) | INTRAVENOUS | Status: DC
  Administered 2018-03-01 – 2018-03-02 (×2): 3 g via INTRAVENOUS
  Filled 2018-03-01 (×3): qty 3

## 2018-03-01 MED ORDER — ACETAMINOPHEN 500 MG PO TABS
1000.0000 mg | ORAL_TABLET | Freq: Once | ORAL | Status: AC
  Administered 2018-03-01: 1000 mg via ORAL
  Filled 2018-03-01: qty 2

## 2018-03-01 MED ORDER — LORAZEPAM 1 MG PO TABS
0.0000 mg | ORAL_TABLET | Freq: Four times a day (QID) | ORAL | Status: DC
  Administered 2018-03-02: 2 mg via ORAL
  Filled 2018-03-01: qty 2

## 2018-03-01 MED ORDER — LORAZEPAM 2 MG/ML IJ SOLN: 0.0000 mg | Freq: Two times a day (BID) | INTRAMUSCULAR | Status: DC

## 2018-03-01 MED ORDER — LEVALBUTEROL HCL 0.63 MG/3ML IN NEBU: 0.6300 mg | INHALATION_SOLUTION | Freq: Four times a day (QID) | RESPIRATORY_TRACT | Status: DC | PRN

## 2018-03-01 MED ORDER — SODIUM CHLORIDE 0.9 % IV BOLUS
500.0000 mL | Freq: Once | INTRAVENOUS | Status: AC
  Administered 2018-03-01: 500 mL via INTRAVENOUS

## 2018-03-01 MED ORDER — ALBUTEROL SULFATE HFA 108 (90 BASE) MCG/ACT IN AERS
1.0000 | INHALATION_SPRAY | Freq: Once | RESPIRATORY_TRACT | Status: AC
  Administered 2018-03-01: 1 via RESPIRATORY_TRACT
  Filled 2018-03-01: qty 6.7

## 2018-03-01 MED ORDER — THIAMINE HCL 100 MG/ML IJ SOLN
100.0000 mg | Freq: Every day | INTRAMUSCULAR | Status: DC
  Administered 2018-03-01: 100 mg via INTRAVENOUS
  Filled 2018-03-01: qty 2

## 2018-03-01 MED ORDER — VITAMIN B-1 100 MG PO TABS
100.0000 mg | ORAL_TABLET | Freq: Every day | ORAL | Status: DC
  Administered 2018-03-02 – 2018-03-04 (×3): 100 mg via ORAL
  Filled 2018-03-01 (×3): qty 1

## 2018-03-01 MED ORDER — ONDANSETRON HCL 4 MG PO TABS: 4.0000 mg | ORAL_TABLET | Freq: Three times a day (TID) | ORAL | 0 refills | Status: DC | PRN

## 2018-03-01 MED ORDER — CHLORDIAZEPOXIDE HCL 5 MG PO CAPS
5.0000 mg | ORAL_CAPSULE | Freq: Three times a day (TID) | ORAL | Status: DC
  Administered 2018-03-01 – 2018-03-04 (×7): 5 mg via ORAL
  Filled 2018-03-01 (×7): qty 1

## 2018-03-01 MED ORDER — LEVALBUTEROL HCL 0.63 MG/3ML IN NEBU: 0.6300 mg | INHALATION_SOLUTION | Freq: Four times a day (QID) | RESPIRATORY_TRACT | Status: DC

## 2018-03-01 MED ORDER — HEPARIN BOLUS VIA INFUSION
6000.0000 [IU] | Freq: Once | INTRAVENOUS | Status: AC
  Administered 2018-03-01: 6000 [IU] via INTRAVENOUS
  Filled 2018-03-01: qty 6000

## 2018-03-01 MED ORDER — DM-GUAIFENESIN ER 30-600 MG PO TB12
1.0000 | ORAL_TABLET | Freq: Two times a day (BID) | ORAL | Status: DC | PRN
  Administered 2018-03-02: 1 via ORAL
  Filled 2018-03-01: qty 1

## 2018-03-01 MED ORDER — ALBUTEROL SULFATE (2.5 MG/3ML) 0.083% IN NEBU
5.0000 mg | INHALATION_SOLUTION | Freq: Once | RESPIRATORY_TRACT | Status: AC
  Administered 2018-03-01: 5 mg via RESPIRATORY_TRACT
  Filled 2018-03-01: qty 6

## 2018-03-01 MED ORDER — HYDRALAZINE HCL 20 MG/ML IJ SOLN: 5.0000 mg | INTRAMUSCULAR | Status: DC | PRN

## 2018-03-01 MED ORDER — ONDANSETRON HCL 4 MG/2ML IJ SOLN
4.0000 mg | Freq: Once | INTRAMUSCULAR | Status: AC
  Administered 2018-03-01: 4 mg via INTRAVENOUS
  Filled 2018-03-01: qty 2

## 2018-03-01 MED ORDER — IOPAMIDOL (ISOVUE-370) INJECTION 76%
100.0000 mL | Freq: Once | INTRAVENOUS | Status: AC | PRN
  Administered 2018-03-01: 66 mL via INTRAVENOUS

## 2018-03-01 MED ORDER — HEPARIN (PORCINE) 25000 UT/250ML-% IV SOLN
1550.0000 [IU]/h | INTRAVENOUS | Status: DC
  Administered 2018-03-01 – 2018-03-04 (×4): 1550 [IU]/h via INTRAVENOUS
  Filled 2018-03-01 (×4): qty 250

## 2018-03-01 MED ORDER — VANCOMYCIN HCL IN DEXTROSE 1-5 GM/200ML-% IV SOLN
1000.0000 mg | Freq: Once | INTRAVENOUS | Status: DC
  Administered 2018-03-01: 1000 mg via INTRAVENOUS
  Filled 2018-03-01: qty 200

## 2018-03-01 MED ORDER — PREDNISONE 20 MG PO TABS: 40.0000 mg | ORAL_TABLET | Freq: Every day | ORAL | 0 refills | Status: DC

## 2018-03-01 MED ORDER — SODIUM CHLORIDE 0.9 % IV SOLN
500.0000 mg | INTRAVENOUS | Status: DC
  Administered 2018-03-01: 500 mg via INTRAVENOUS
  Filled 2018-03-01: qty 500

## 2018-03-01 MED ORDER — OSELTAMIVIR PHOSPHATE 75 MG PO CAPS: 75.0000 mg | ORAL_CAPSULE | Freq: Two times a day (BID) | ORAL | 0 refills | Status: DC

## 2018-03-01 MED ORDER — SODIUM CHLORIDE 0.9 % IV SOLN
Freq: Once | INTRAVENOUS | Status: AC
  Administered 2018-03-01: 18:00:00 via INTRAVENOUS

## 2018-03-01 MED ORDER — SODIUM CHLORIDE 0.9 % IV SOLN
2.0000 g | Freq: Once | INTRAVENOUS | Status: DC
  Filled 2018-03-01: qty 2

## 2018-03-01 MED ORDER — LORAZEPAM 2 MG/ML IJ SOLN
0.0000 mg | Freq: Four times a day (QID) | INTRAMUSCULAR | Status: DC
  Administered 2018-03-01 – 2018-03-03 (×6): 2 mg via INTRAVENOUS
  Filled 2018-03-01 (×6): qty 1

## 2018-03-01 MED ORDER — ACETAMINOPHEN 325 MG PO TABS: 650.0000 mg | ORAL_TABLET | Freq: Four times a day (QID) | ORAL | Status: DC | PRN

## 2018-03-01 MED ORDER — IBUPROFEN 400 MG PO TABS
600.0000 mg | ORAL_TABLET | Freq: Once | ORAL | Status: AC
  Administered 2018-03-01: 600 mg via ORAL
  Filled 2018-03-01: qty 1

## 2018-03-01 MED ORDER — ONDANSETRON HCL 4 MG/2ML IJ SOLN: 4.0000 mg | Freq: Three times a day (TID) | INTRAMUSCULAR | Status: DC | PRN

## 2018-03-01 MED ORDER — OSELTAMIVIR PHOSPHATE 75 MG PO CAPS
75.0000 mg | ORAL_CAPSULE | Freq: Once | ORAL | Status: AC
  Administered 2018-03-01: 75 mg via ORAL
  Filled 2018-03-01: qty 1

## 2018-03-01 NOTE — ED Provider Notes (Signed)
[facility]   CSN: [id number] Arrival date & time: 03/01/18  1336     History   Chief Complaint Chief Complaint  Patient presents with  . Chills  . Generalized Body Aches    HPI Daniel [name] is a 48 y.o. male  presenting for evaluation of fever, body aches, cough, congestion.  Patient states yesterday he developed generalized body aches, subjective fever, and a nonproductive cough.  He reports associated nasal congestion.  He has associated nausea and vomiting, has been unable to tolerate PO today, vomited x2 since yesterday. Patient states he has a history of asthma, does feel like he is having dyspnea on exertion/tightness similar to his asthma, but does not have an inhaler at home.  He denies sick contacts.  He denies getting his flu shot.  He denies ear pain, sore throat, chest pain, abdominal pain, urinary symptoms, normal bowel movements.  Patient reports a history of hypertension for which she is not taking medication due to financial constraint.  Denies other medical problems. Pt smokes cigarettes daily.  He has not taken anything for his symptoms including Tylenol ibuprofen.  Additional history obtained from chart review, patient with a history of asthma, hypertension, and alcohol use.  HPI  Past Medical History:  Diagnosis Date  . Asthma   . Asthma   . Hypertension     Patient Active Problem List   Diagnosis Date Noted  . Alcohol dependence (HCC) 09/05/2012  . 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      Home Medications    Prior to Admission medications   Not on File    Family History No family history on file.  Social History Social History   Tobacco Use  . Smoking status: Current Some Day Smoker    Packs/day: 0.50    Types: Cigarettes  . Smokeless tobacco: Never Used  Substance Use Topics  . Alcohol use:  Yes    Comment: occasional  . Drug use: Not Currently     Allergies   Patient has no known allergies.   Review of Systems Review of Systems  Constitutional: Positive for fever (subjective).  HENT: Positive for congestion.   Respiratory: Positive for cough, chest tightness and shortness of breath.   Gastrointestinal: Positive for nausea and vomiting.  Musculoskeletal: Positive for myalgias.  All other systems reviewed and are negative.    Physical Exam Updated Vital Signs BP (!) 154/83   Pulse (!) 114   Temp 100 F (37.8 C) (Oral)   Resp (!) 22   SpO2 91%   Physical Exam Vitals signs and nursing note reviewed.  Constitutional:      General: He is not in acute distress.    Appearance: He is well-developed.     Comments: Appears nontoxic  HENT:     Head: Normocephalic and atraumatic.     Comments: Nasal congestion noted.  OP clear without tonsillar swelling or exudate.  Uvula midline with ago palate rise.  TMs nonerythematous nonbulging bilaterally.    Right Ear: Tympanic membrane, ear canal and external ear normal.     Left Ear: Tympanic membrane, ear canal and external ear normal.     Nose: Congestion present.     Right Sinus: No maxillary sinus tenderness or frontal sinus tenderness.     Left Sinus: No maxillary sinus tenderness or frontal sinus tenderness.  Mouth/Throat:     Pharynx: Uvula midline.     Tonsils: No tonsillar exudate.  Eyes:     Conjunctiva/sclera: Conjunctivae normal.     Pupils: Pupils are equal, round, and reactive to light.  Neck:     Musculoskeletal: Normal range of motion.  Cardiovascular:     Rate and Rhythm: Regular rhythm. Tachycardia present.     Comments: Tachycardic around 130 Pulmonary:     Effort: Pulmonary effort is normal.     Breath sounds: Wheezing present. No decreased breath sounds, rhonchi or rales.     Comments: Scattered expiratory wheezing.  SPO2 at 88% until taking deep breaths, increases to 92%.  Pt is in no  respiratory distress. Abdominal:     General: There is no distension.     Palpations: Abdomen is soft. There is no mass.     Tenderness: There is no abdominal tenderness. There is no guarding or rebound.     Comments: No TTP of the abd. Soft without rigidity, guarding, or distention  Musculoskeletal: Normal range of motion.     Right lower leg: Edema present.     Left lower leg: Edema present.     Comments: Mild bilateral lower extremity swelling without pitting edema.  Patient states this is abnormal for him  Lymphadenopathy:     Cervical: No cervical adenopathy.  Skin:    General: Skin is warm.     Capillary Refill: Capillary refill takes less than 2 seconds.  Neurological:     Mental Status: He is alert and oriented to person, place, and time.      ED Treatments / Results  Labs (all labs ordered are listed, but only abnormal results are displayed) Labs Reviewed  CBC WITH DIFFERENTIAL/PLATELET  COMPREHENSIVE METABOLIC PANEL    EKG None  Radiology Dg Chest 2 View  Result Date: 03/01/2018 CLINICAL DATA:  Cough and fever for 2 days. EXAM: CHEST - 2 VIEW COMPARISON:  08/15/2017 FINDINGS: The cardiac silhouette, mediastinal and hilar contours are normal in stable. The lungs are clear. No pleural effusion. No worrisome pulmonary lesions. The bony thorax is intact. IMPRESSION: No acute cardiopulmonary findings. Electronically Signed   By: Rudie Meyer M.D.   On: 03/01/2018 14:23    Procedures Procedures (including critical care time)  Medications Ordered in ED Medications  albuterol (PROVENTIL) (2.5 MG/3ML) 0.083% nebulizer solution 5 mg (has no administration in time range)  ipratropium-albuterol (DUONEB) 0.5-2.5 (3) MG/3ML nebulizer solution 3 mL (3 mLs Nebulization Given 03/01/18 1354)  sodium chloride 0.9 % bolus 500 mL (500 mLs Intravenous New Bag/Given 03/01/18 1354)  ondansetron (ZOFRAN) injection 4 mg (4 mg Intravenous Given 03/01/18 1354)  acetaminophen (TYLENOL) tablet  1,000 mg (1,000 mg Oral Given 03/01/18 1402)     Initial Impression / Assessment and Plan / ED Course  I have reviewed the triage vital signs and the nursing notes.  Pertinent labs & imaging results that were available during my care of the patient were reviewed by me and considered in my medical decision making (see chart for details).     Patient presenting for evaluation 1 day history of flulike symptoms.  Physical exam shows patient who is tachycardic and mildly hypoxic.  However, no respiratory distress.  Scattered expiratory wheezing, viral illness likely exacerbated by asthma.  Will treat with DuoNeb, prednisone.  Fluids for tachycardia, Zofran for nausea.  Patient has normal bilateral swelling, although not pitting edema, will obtain chest x-ray to rule out pulmonary effusion/significant cardiomegaly. Doubt PE in  the setting of wheezing and flu-like symptoms.  Tylenol for low grade fever of 100.0.   On reassessment, patient reports he is feeling better.  Wheezing has improved, cxr and labs pending. SpO2 improved.   Chest x-ray viewed interpreted by me, no pneumonia, pneumothorax, effusion, cardiomegaly.  No signs of CHF.  On reassessment, patient reports chest tightness is returning.  Patient with worsened wheezing, will give another breathing treatment and reassess.  Pt signed out to A Murrray, PA-C for follow-up after second breathing treatment and follow-up on labs.  Plan to ambulate patient to assess for stability for discharge.  Patient to get prednisone, Tamiflu, and inhaler for home.   Final Clinical Impressions(s) / ED Diagnoses   Final diagnoses:  None    ED Discharge Orders    None       Alveria Apley, PA-C 03/01/18 1515    Tilden Fossa, MD 03/01/18 [phone]

## 2018-03-01 NOTE — ED Notes (Signed)
CCM at bedside 

## 2018-03-01 NOTE — ED Notes (Addendum)
Pt ambulatory with no assistance. SpO2 93% before ambulating, was between 93% and 95% while ambulating. SpO2 96% upon returning to chair. Pt HR observed to be in the 140s while ambulating. Pt placed on 5-lead cardiac monitor and observed HR to be in the 120s. Notified Dr. Madilyn Hook and Emi Holes.

## 2018-03-01 NOTE — H&P (Signed)
History and Physical    [name] LPF:[id number] DOB: 01-Jun-1970 DOA: 03/01/2018  Referring MD/NP/PA:   PCP: Patient, No Pcp Per   Patient coming from:  The patient is coming from home.  At baseline, pt is independent for most of ADL.        Chief Complaint: fever, chills, cough  HPI: Daniel [name] is a 48 y.o. male with medical history significant of hypertension, asthma, alcohol abuse, tobacco abuse, drug abuse, who presents with fever, chills, cough.  His symptoms started yesterday, including fever, chills, cough, runny nose and a sore throat.  Patient has a dry cough, denies chest pain or shortness of breath.  He was found to have oxygen desaturation to 88% and tachycardia with HR up to 140s in ED.  Patient states that he has nausea, and vomited once yesterday, which has resolved.  Currently no nausea vomiting, diarrhea or abdominal pain.  No symptoms of UTI or unilateral weakness.  ED Course: pt was found to have WBC 6.9, lactic acid of 1.7, INR 1.01, troponin negative, flu PCR negative, alcohol level less than 10, electrolytes renal function okay, temperature 100.1, tachycardia, tachypnea, chest x-ray negative. CTA of chest demonstrated positive pulmonary embolism with RV strain.  PCCM was consulted.  Review of Systems:   General: has fevers, chills, no body weight gain, has poor appetite, has fatigue and body aches HEENT: no blurry vision, hearing changes or sore throat Respiratory: no dyspnea, has coughing, no wheezing CV: no chest pain, no palpitations GI: no nausea, vomiting, abdominal pain, diarrhea, constipation GU: no dysuria, burning on urination, increased urinary frequency, hematuria  Ext: no leg edema Neuro: no unilateral weakness, numbness, or tingling, no vision change or hearing loss Skin: no rash, no skin tear. MSK: No muscle spasm, no deformity, no limitation of range of movement in spin Heme: No easy bruising.  Travel history: No recent long distant  travel.  Allergy: No Known Allergies  Past Medical History:  Diagnosis Date  . Asthma   . Asthma   . Hypertension     Past Surgical History:  Procedure Laterality Date  . Dental procedure      Social History:  reports that he has been smoking cigarettes. He has been smoking about 0.50 packs per day. He has never used smokeless tobacco. He reports current alcohol use. He reports previous drug use.  Family History:  Family History  Problem Relation Age of Onset  . Heart attack Mother      Prior to Admission medications   Medication Sig Start Date End Date Taking? Authorizing Provider  ondansetron (ZOFRAN) 4 MG tablet Take 1 tablet (4 mg total) by mouth every 8 (eight) hours as needed for nausea or vomiting. 03/01/18   Caccavale, Sophia, PA-C  oseltamivir (TAMIFLU) 75 MG capsule Take 1 capsule (75 mg total) by mouth every 12 (twelve) hours. 03/01/18   Caccavale, Sophia, PA-C  predniSONE (DELTASONE) 20 MG tablet Take 2 tablets (40 mg total) by mouth daily for 4 days. 03/01/18 03/05/18  Alveria Apley, PA-C    Physical Exam: Vitals:   03/01/18 2209 03/02/18 0000 03/02/18 0142 03/02/18 0555  BP: (!) 142/80 (!) 156/95  (!) 137/97  Pulse: (!) 126 (!) 113    Resp:      Temp: 98.5 F (36.9 C)     TempSrc: Oral     SpO2: 97%  99%   Weight:      Height:       General: Not in acute  distress HEENT:       Eyes: PERRL, EOMI, no scleral icterus.       ENT: No discharge from the ears and nose, no pharynx injection, no tonsillar enlargement.        Neck: No JVD, no bruit, no mass felt. Heme: No neck lymph node enlargement. Cardiac: S1/S2, RRR, No murmurs, No gallops or rubs. Respiratory: Has decreased air movement bilaterally GI: Soft, nondistended, nontender, no rebound pain, no organomegaly, BS present. GU: No hematuria Ext: No pitting leg edema bilaterally. 2+DP/PT pulse bilaterally. Musculoskeletal: No joint deformities, No joint redness or warmth, no limitation of ROM in  spin. Skin: No rashes.  Neuro: Alert, oriented X3, cranial nerves II-XII grossly intact, moves all extremities normally.  Psych: Patient is not psychotic, no suicidal or hemocidal ideation.  Labs on Admission: I have personally reviewed following labs and imaging studies  CBC: Recent Labs  Lab 03/01/18 1358 03/02/18 0014  WBC 6.9 6.4  NEUTROABS 5.0  --   HGB 11.8* 11.5*  HCT 34.3* 34.0*  MCV 91.7 91.4  PLT 104* 102*   Basic Metabolic Panel: Recent Labs  Lab 03/01/18 1358  NA 136  K 4.0  CL 94*  CO2 22  GLUCOSE 98  BUN 12  CREATININE 1.05  CALCIUM 8.7*   GFR: Estimated Creatinine Clearance: 101.2 mL/min (by C-G formula based on SCr of 1.05 mg/dL). Liver Function Tests: Recent Labs  Lab 03/01/18 1358  AST 105*  ALT 50*  ALKPHOS 84  BILITOT 1.8*  PROT 7.6  ALBUMIN 4.1   No results for input(s): LIPASE, AMYLASE in the last 168 hours. No results for input(s): AMMONIA in the last 168 hours. Coagulation Profile: Recent Labs  Lab 03/01/18 1619  INR 1.01   Cardiac Enzymes: Recent Labs  Lab 03/01/18 1645 03/01/18 1947  TROPONINI <0.03 <0.03   BNP (last 3 results) No results for input(s): PROBNP in the last 8760 hours. HbA1C: No results for input(s): HGBA1C in the last 72 hours. CBG: No results for input(s): GLUCAP in the last 168 hours. Lipid Profile: No results for input(s): CHOL, HDL, LDLCALC, TRIG, CHOLHDL, LDLDIRECT in the last 72 hours. Thyroid Function Tests: No results for input(s): TSH, T4TOTAL, FREET4, T3FREE, THYROIDAB in the last 72 hours. Anemia Panel: No results for input(s): VITAMINB12, FOLATE, FERRITIN, TIBC, IRON, RETICCTPCT in the last 72 hours. Urine analysis:    Component Value Date/Time   COLORURINE YELLOW 01/06/2009 0512   APPEARANCEUR CLEAR 01/06/2009 0512   LABSPEC 1.017 01/06/2009 0512   PHURINE 6.5 01/06/2009 0512   GLUCOSEU NEGATIVE 01/06/2009 0512   HGBUR NEGATIVE 01/06/2009 0512   BILIRUBINUR NEGATIVE 01/06/2009 0512    KETONESUR NEGATIVE 01/06/2009 0512   PROTEINUR NEGATIVE 01/06/2009 0512   UROBILINOGEN 1.0 01/06/2009 0512   NITRITE NEGATIVE 01/06/2009 0512   LEUKOCYTESUR SMALL (A) 01/06/2009 0512   Sepsis Labs: @LABRCNTIP (procalcitonin:4,lacticidven:4) ) Recent Results (from the past 240 hour(s))  Culture, sputum-assessment     Status: None   Collection Time: 03/01/18 10:47 PM  Result Value Ref Range Status   Specimen Description EXPECTORATED SPUTUM  Final   Special Requests NONE  Final   Sputum evaluation   Final    THIS SPECIMEN IS ACCEPTABLE FOR SPUTUM CULTURE Performed at [facility], [address] [place], [place] [address]    Report Status 03/01/2018 FINAL  Final  Culture, respiratory     Status: None (Preliminary result)   Collection Time: 03/01/18 10:47 PM  Result Value Ref Range Status  Specimen Description EXPECTORATED SPUTUM  Final   Special Requests NONE Reflexed from [id number]  Final   Gram Stain   Final    ABUNDANT WBC PRESENT, PREDOMINANTLY PMN FEW GRAM POSITIVE COCCI RARE GRAM NEGATIVE RODS RARE GRAM POSITIVE RODS Performed at [facility], [address], [place], [place] [address]    Culture PENDING  Incomplete   Report Status PENDING  Incomplete     Radiological Exams on Admission: Dg Chest 2 View  Result Date: 03/01/2018 CLINICAL DATA:  Cough and fever for 2 days. EXAM: CHEST - 2 VIEW COMPARISON:  08/15/2017 FINDINGS: The cardiac silhouette, mediastinal and hilar contours are normal in stable. The lungs are clear. No pleural effusion. No worrisome pulmonary lesions. The bony thorax is intact. IMPRESSION: No acute cardiopulmonary findings. Electronically Signed   By: Rudie Meyer M.D.   On: 03/01/2018 14:23   Ct Angio Chest Pe W/cm &/or Wo Cm  Result Date: 03/01/2018 CLINICAL DATA:  Acute onset body aches, fever and chills yesterday. Tachycardia. History of asthma. EXAM: CT ANGIOGRAPHY CHEST WITH CONTRAST TECHNIQUE: Multidetector CT imaging of the chest  was performed using the standard protocol during bolus administration of intravenous contrast. Multiplanar CT image reconstructions and MIPs were obtained to evaluate the vascular anatomy. CONTRAST:  66mL ISOVUE-370 IOPAMIDOL (ISOVUE-370) INJECTION 76% COMPARISON:  Chest radiograph March 01, 2018. CT chest report dated August 15, 2017 though images are not available for direct comparison. FINDINGS: CARDIOVASCULAR: Suboptimal contrast opacification of the pulmonary artery's (main pulmonary artery is 167 Hounsfield units, target is 250 Hounsfield units. Main pulmonary artery is mildly enlarged at 3.1 cm. Lingular lobar LEFT upper lobe segmental to subsegmental central pulmonary arterial filling defects. RIGHT lower lobe segmental to subsegmental nonocclusive pulmonary emboli. RIGHT middle lobe to segmental emboli. Heart size is normal. RIGHT heart strain (RV/LV equals 1.1). No pericardial effusion. Thoracic aorta is normal course and caliber, unremarkable. MEDIASTINUM/NODES: No lymphadenopathy by CT size criteria. LUNGS/PLEURA: Tracheobronchial tree is patent, no pneumothorax. Mild bronchial wall thickening. Subcentimeter nodular densities RIGHT lower lobe medial segment. Patchy ground-glass opacity and small consolidation LEFT lower lobe sparing the periphery. Minimal LEFT upper lobe tree-in-bud infiltrates. No pleural effusion. UPPER ABDOMEN: Non-acute. Hypodense liver most compatible with steatosis. MUSCULOSKELETAL: Non-acute. Mild thoracic spondylosis. Gynecomastia. Moderate RIGHT acromioclavicular osteoarthrosis. Review of the MIP images confirms the above findings. IMPRESSION: 1. Acute bilateral occlusive and nonocclusive pulmonary emboli predominantly in segmental to subsegmental branches. CT evidence of right heart strain (RV/LV Ratio = 1.1) consistent with at least submassive (intermediate risk) PE. The presence of right heart strain has been associated with an increased risk of morbidity and mortality.  Please activate Code PE by paging [phone]. 2. Bronchial wall thickening and scattered consolidations concerning for bronchopneumonia or aspiration, less likely septic emboli. No pulmonary infarcts. 3. Critical Value/emergent results were called by telephone at the time of interpretation on 03/01/2018 at 4:59 pm to PA. Aviva Kluver , who verbally acknowledged these results. Electronically Signed   By: Awilda Metro M.D.   On: 03/01/2018 17:02     EKG: Independently reviewed.  Sinus rhythm, tachycardia, QTC 477, LAD, LAE, poor R wave progression, T wave inversion in V5-V6.  Assessment/Plan Principal Problem:   Acute respiratory failure with hypoxia (HCC) Active Problems:   Alcohol abuse   Tobacco abuse   PE (pulmonary thromboembolism) (HCC)   Thrombocytopenia (HCC)   Exacerbation of asthma   HTN (hypertension)   Acute respiratory failure with hypoxia due to possible PE and possible asthma  exacerbation: CTA of chest demonstrated positive pulmonary embolism with RV strain.  PCCM was consulted. Per PCCM,  Pt has goood RV function on bedside echo, which raises some suspicion whether patient is truly having PE.  PCCM recommended to repeat CTA or getting VQ to confirm PE. Pt also have cough and decreased air movement bilaterally, indicating possible asthma exacerbation.  Initially antibiotics (Unasyn and azithromycin) were ordered due to suspicion of aspiration pneumonia), but PCCM recommended to hold off antibiotics due to their low suspicions for pneumonia.  -will admit to SDU bed as inpt -heparin drip initiated -2D echocardiogram ordered -LE dopplers ordered to evaluate for DVT - check BNP -Bronchodilators for possible asthma exacerbation.  Tobacco abuse and Alcohol abuse: -Did counseling about importance of quitting smoking -Nicotine patch -Did counseling about the importance of quitting drinking -CIWA protocol  HTN: Bp 147/96. pt is not taking meds at home. -prn hydralazine  IV  Thrombocytopenia (HCC): this is a chronic issue, platelet 104, no bleeding tendency. Likely due to alcohol abuse. -f/u by CBC    Inpatient status:  # Patient requires inpatient status due to high intensity of service, high risk for further deterioration and high frequency of surveillance required.  I certify that at the point of admission it is my clinical judgment that the patient will require inpatient hospital care spanning beyond 2 midnights from the point of admission.  . This patient has multiple chronic comorbidities including hypertension, asthma, alcohol abuse, tobacco abuse, drug abuse, who presents with fever, chills, cough. . Now patient has presenting with possible PE and asthma exacerbation . The worrisome physical exam findings include decreased air movement bilaterally . The initial radiographic and laboratory data are worrisome because of PE with evidence of right heart straining . Current medical needs: please see my assessment and plan . Predictability of an adverse outcome (risk): Patient has multiple comorbidities, now presents with possible PE with evidence of right heart straining, and asthma exacerbation.  Patient is high risk of deteriorating.  Will need to be treated in hospital for at least 2 days.    DVT ppx: IV Heparin   Code Status: Full code Family Communication: None at bed side. Disposition Plan:  Anticipate discharge back to previous home environment Consults called:  PCCM Admission status:  SDU/inpation       Date of Service 03/02/2018    Lorretta HarpXilin Camila Maita [facility]   If 7PM-7AM, please contact night-coverage [email or web address] Password [facility] 03/02/2018, 6:14 AM

## 2018-03-01 NOTE — ED Notes (Signed)
Attempted report 

## 2018-03-01 NOTE — ED Provider Notes (Signed)
Physical Exam  BP (!) 154/83   Pulse (!) 114   Temp 100 F (37.8 C) (Oral)   Resp (!) 22   SpO2 91%   [facility] from [facility], PA-C at 1530. Briefly, the patient is a 48 y.o. male with PMHx of  has a past medical history of Asthma, Asthma, and Hypertension. here with cough, myalgias, and shortness of breath.  Please see her note for full H&P.  Patient reporting that he has had 1 day of increasing myalgias, fever, and shortness of breath.  He denies any history of DVT/PE, reasonable position, hospitalization, hormone use, cancer treatment, or recent surgery.  He does report that he drinks alcohol, approximately 2, 40 ounces of beer per week.   Labs Reviewed  CBC WITH DIFFERENTIAL/PLATELET - Abnormal; Notable for the following components:      Result Value   RBC 3.74 (*)    Hemoglobin 11.8 (*)    HCT 34.3 (*)    RDW 17.0 (*)    Platelets 104 (*)    All other components within normal limits  COMPREHENSIVE METABOLIC PANEL - Abnormal; Notable for the following components:   Chloride 94 (*)    Calcium 8.7 (*)    AST 105 (*)    ALT 50 (*)    Total Bilirubin 1.8 (*)    Anion gap 20 (*)    All other components within normal limits  CULTURE, BLOOD (ROUTINE X 2)  CULTURE, BLOOD (ROUTINE X 2)  PROTIME-INR  LACTIC ACID, PLASMA  INFLUENZA PANEL BY PCR (TYPE A & B)  LACTIC ACID, PLASMA  ETHANOL  TROPONIN I  URINALYSIS, ROUTINE W REFLEX MICROSCOPIC  RAPID URINE DRUG SCREEN, HOSP PERFORMED  BRAIN NATRIURETIC PEPTIDE    Course of Care:   Physical Exam Vitals signs and nursing note reviewed.  Constitutional:      Appearance: He is well-developed. He is diaphoretic. He is not toxic-appearing.  HENT:     Head: Normocephalic and atraumatic.     Mouth/Throat:     Comments: No erythema posterior pharynx. Eyes:     Conjunctiva/sclera: Conjunctivae normal.     Pupils: Pupils are equal, round, and reactive to light.  Neck:     Musculoskeletal: Normal range of motion and neck  supple.  Cardiovascular:     Rate and Rhythm: Regular rhythm. Tachycardia present.     Heart sounds: S1 normal and S2 normal. No murmur.  Pulmonary:     Breath sounds: No wheezing or rales.     Comments: Patient is tachypneic.  Diminished lung sounds in bilateral lung bases.  No wheezes on exam. Abdominal:     General: There is no distension.     Palpations: Abdomen is soft.     Tenderness: There is no abdominal tenderness. There is no guarding.  Musculoskeletal: Normal range of motion.        General: No deformity.  Lymphadenopathy:     Cervical: No cervical adenopathy.  Skin:    General: Skin is warm.     Findings: No erythema or rash.  Neurological:     Mental Status: He is alert.     Comments: Cranial nerves grossly intact. Patient moves extremities symmetrically and with good coordination.  Psychiatric:        Behavior: Behavior normal.        Thought Content: Thought content normal.        Judgment: Judgment normal.     ED Course/Procedures   Clinical Course as of Mar 01 1753  Fri Mar 01, 2018  1605 Patient now febrile to 100.5.  Tachycardia out of proportion to fever at 131.  No history of GI bleeding.  Will give Motrin.   [AM]  1658 Received call from radiology.  Patient has multiple subsegmental pulmonary embolisms in addition to right heart strain.  He also has separate, isolated groundglass opacities that raise concern for possible aspiration pneumonia versus septic emboli.  Blood cultures are ordered.  Will start broad-spectrum antibiotics and heparin.  Patient given Tamiflu preemptively until flu swab is back.   [AM]  1709 Code PE paged out. Awaiting return of call.    [AM]  1714 Spoke with Dr. Denese KillingsAgarwala who will see patient.    [AM]  1748 Spoke with pharmacy. Will change to Unasyn and azithro given less likely septic emboli.    [AM]    Clinical Course User Index [AM] Elisha PonderMurray, Mykayla Brinton B, PA-C    Procedures  CRITICAL CARE Performed by: Elisha PonderAlyssa B  Melvinia Ashby   Total critical care time: 35 minutes  Critical care time was exclusive of separately billable procedures and treating other patients.  Critical care was necessary to treat or prevent imminent or life-threatening deterioration.  Critical care was time spent personally by me on the following activities: development of treatment plan with patient and/or surrogate as well as nursing, discussions with consultants, evaluation of patient's response to treatment, examination of patient, obtaining history from patient or surrogate, ordering and performing treatments and interventions, ordering and review of laboratory studies, ordering and review of radiographic studies, pulse oximetry and re-evaluation of patient's condition.  MDM   On my examination, patient is nontoxic-appearing, however vital signs are out of proportion to fever.  He is persistently tachycardic.  He did receive multiple nebulization treatments, but is not wheezing.  Will assess for pulmonary embolism versus underlying pneumonia not radiographically evident yet on chest x-ray.  Patient has multiple pulmonary embolus with right heart strain.  Troponin is pending.  Patient be given heparin, broad-spectrum antibiotics.  Patient made code sepsis.  Will admit.  This is a shared visit with Dr. Tilden FossaElizabeth Rees. Patient was independently evaluated by this attending physician. Attending physician consulted in evaluation and admission management.   Elisha PonderMurray, Jessicia Napolitano B, PA-C 03/01/18 1755    Tilden Fossaees, Elizabeth, MD 03/02/18 [phone]

## 2018-03-01 NOTE — Consult Note (Signed)
NAME:  Daniel [name], MRN:  [id number], DOB:  11-29-1970, LOS: 0 ADMISSION DATE:  03/01/2018, CONSULTATION DATE:  2/7 REFERRING MD:  2/7, CHIEF COMPLAINT:  PE  Brief History   48 year old male who presented with flulike symptoms on 2/7.  CTA concerning for submassive PE.  Bedside echo in ED negative for RV strain, however, appears to be experiencing alcohol withdrawal and endorses frequent increase and then stopping of alcohol use.  History of present illness   48 year old male with past medical history as below, which is significant for hypertension and asthma.  He was in his usual state of health until about 2/6 when he developed flulike symptoms including body aches and shortness of breath.  Specifically leg aches.  He also reports that he had been drinking more frequently as of late and on 2/6 only had one beer.  Upon arrival to the emergency department he was found to be hypoxic to 88% on room air and tachycardic into the 140s.  This raise concern for pulmonary embolism and he was admitted to the CT scanner which demonstrated positive pulmonary embolism with RV strain.  PCCM was asked to see in evaluation.  Past Medical History   has a past medical history of Asthma, Asthma, and Hypertension.  Significant Hospital Events   2/7 admit  Consults:  Pulm  Procedures:    Significant Diagnostic Tests:  CTA chest 2/7> Acute bilateral occlusive and nonocclusive pulmonary emboli predominantly in segmental to subsegmental branches. CT evidence of right heart strain (RV/LV Ratio = 1.1). Bronchial wall thickening and scattered consolidations concerning for bronchopneumonia or aspiration, less likely septic emboli. No pulmonary infarcts.   Micro Data:    Antimicrobials:    Interim history/subjective:    Objective   Blood pressure (!) 150/109, pulse (!) 125, temperature 100.1 F (37.8 C), temperature source Oral, resp. rate 20, height 5\' 8"  (1.727 m), weight 99.8 kg, SpO2 90 %.         Intake/Output Summary (Last 24 hours) at 03/01/2018 1829 Last data filed at 03/01/2018 1425 Gross per 24 hour  Intake 499.98 ml  Output -  Net 499.98 ml   Filed Weights   03/01/18 1745  Weight: 99.8 kg    Examination: General: obese middle aged male in NAD. Tremulous.  HENT: [place]/AT, PERRL, no JVD Lungs: Clear bilateral breath sounds Cardiovascular: achy, regular, no MRG Abdomen: Soft non-tender, non-distended Extremities: No acute deformity or ROM limitation.  Neuro: Alert, oriented, non-focal  Resolved Hospital Problem list     Assessment & Plan:   Pulmonary emboli: read as submassive on CT. Good RV function on bedside echo. Troponin negative. If he truly does have PE it does not have a hemodynamic effect on his RV.  - Supplemental O2 to keep sats > 92% - Reasonable to initiate heparin infusion - Recommend repeating CTA or getting VQ to confirm PE.  - Formal echo  Hypoxemic respiratory failure: can certainly be described by PE if present. He also has some scattered consolidation on CT. Doubt this represents pneumonia. More likely atelectasis.  - supplemental O2 - Incentive spirometry - Hold off on antibiotics.   Asthma with possible exacerbation - hold of on steroids - scheduled nebs.   Alcohol withdrawal: explains tachycardia, tremors, diaphoresis. He had been drinking a lot more, until the past day or two. ETOH level negative.  - CIWA protocol per primary  Best practice:  Diet: Per priary Pain/Anxiety/Delirium protocol (if indicated): na VAP protocol (if indicated): na DVT prophylaxis: per  primary GI prophylaxis: na Glucose control: na Mobility: up ad lib Code Status: FULL Family Communication: patient updated Disposition: PCU  Labs   CBC: Recent Labs  Lab 03/01/18 1358  WBC 6.9  NEUTROABS 5.0  HGB 11.8*  HCT 34.3*  MCV 91.7  PLT 104*    Basic Metabolic Panel: Recent Labs  Lab 03/01/18 1358  NA 136  K 4.0  CL 94*  CO2 22  GLUCOSE 98  BUN  12  CREATININE 1.05  CALCIUM 8.7*   GFR: Estimated Creatinine Clearance: 101.8 mL/min (by C-G formula based on SCr of 1.05 mg/dL). Recent Labs  Lab 03/01/18 1358 03/01/18 1619  WBC 6.9  --   LATICACIDVEN  --  1.7    Liver Function Tests: Recent Labs  Lab 03/01/18 1358  AST 105*  ALT 50*  ALKPHOS 84  BILITOT 1.8*  PROT 7.6  ALBUMIN 4.1   No results for input(s): LIPASE, AMYLASE in the last 168 hours. No results for input(s): AMMONIA in the last 168 hours.  ABG    Component Value Date/Time   TCO2 29 10/03/2014 1549     Coagulation Profile: Recent Labs  Lab 03/01/18 1619  INR 1.01    Cardiac Enzymes: Recent Labs  Lab 03/01/18 1645  TROPONINI <0.03    HbA1C: No results found for: HGBA1C  CBG: No results for input(s): GLUCAP in the last 168 hours.  Review of Systems:   Bolds are positive  Constitutional: weight loss, gain, night sweats, Fevers, chills, fatigue .  HEENT: headaches, Sore throat, sneezing, nasal congestion, post nasal drip, Difficulty swallowing, Tooth/dental problems, visual complaints visual changes, ear ache CV:  chest pain, radiates:,Orthopnea, PND, swelling in lower extremities, dizziness, palpitations, syncope.  GI  heartburn, indigestion, abdominal pain, nausea, vomiting, diarrhea, change in bowel habits, loss of appetite, bloody stools.  Resp: cough, productive: , hemoptysis, dyspnea, chest pain, pleuritic.  Skin: rash or itching or icterus GU: dysuria, change in color of urine, urgency or frequency. flank pain, hematuria  MS: joint pain or swelling. decreased range of motion  Psych: change in mood or affect. depression or anxiety.  Neuro: difficulty with speech, weakness, numbness, ataxia    Past Medical History  He,  has a past medical history of Asthma, Asthma, and Hypertension.   Surgical History   No past surgical history on file.   Social History   reports that he has been smoking cigarettes. He has been smoking about  0.50 packs per day. He has never used smokeless tobacco. He reports current alcohol use. He reports previous drug use.   Family History   His family history is not on file.   Allergies No Known Allergies   Home Medications  Prior to Admission medications   Medication Sig Start Date End Date Taking? Authorizing Provider  ondansetron (ZOFRAN) 4 MG tablet Take 1 tablet (4 mg total) by mouth every 8 (eight) hours as needed for nausea or vomiting. 03/01/18   Caccavale, Sophia, PA-C  oseltamivir (TAMIFLU) 75 MG capsule Take 1 capsule (75 mg total) by mouth every 12 (twelve) hours. 03/01/18   Caccavale, Sophia, PA-C  predniSONE (DELTASONE) 20 MG tablet Take 2 tablets (40 mg total) by mouth daily for 4 days. 03/01/18 03/05/18  Alveria Apley, PA-C     Critical care time:      Joneen Roach, [facility] Pulmonary/Critical Care Pager [phone] or [phone]  03/01/2018 6:52 PM

## 2018-03-01 NOTE — ED Triage Notes (Signed)
Pt reports just yesterday he had a sudden onset of body aches fevers, chills.

## 2018-03-01 NOTE — Progress Notes (Signed)
ANTICOAGULATION CONSULT NOTE - Initial Consult  Pharmacy Consult for heparin Indication: pulmonary embolus  No Known Allergies  Patient Measurements: Height: 5\' 8"  (172.7 cm) Weight: 220 lb (99.8 kg) IBW/kg (Calculated) : 68.4 Heparin Dosing Weight: 90 kg  Vital Signs: Temp: 100.1 F (37.8 C) (02/07 1614) Temp Source: Oral (02/07 1614) BP: 145/105 (02/07 2003) Pulse Rate: 115 (02/07 2003)  Labs: Recent Labs    03/01/18 1358 03/01/18 1619 03/01/18 1645  HGB 11.8*  --   --   HCT 34.3*  --   --   PLT 104*  --   --   LABPROT  --  13.2  --   INR  --  1.01  --   CREATININE 1.05  --   --   TROPONINI  --   --  <0.03    Estimated Creatinine Clearance: 101.8 mL/min (by C-G formula based on SCr of 1.05 mg/dL).  Assessment: CC/HPI: 48 yo m presenting with sudden onset body aches fevers chills  PMH: asthma, HTN  Anticoag: none pta iv hep for new PE  ID: abx for aspiration PNA seen on CTA. WBC 6.9, 100.1  Tamiflu 2/7x1 Unasyn 2/7>> Azithro 2/7>>  2/7 flu neg 2/7 blood x 2  Renal: SCr 1.05  Pulm: acute PE RVLV ratio 1.1  Heme/Onc: H&H 11.8/34.3, Plt 104  Goal of Therapy:  Heparin level 0.3-0.7 units/ml Monitor platelets by anticoagulation protocol: Yes   Plan:  Unasyn 3 g q8, Azithro 500 mg qd Monitor renal fx cx lot Heparin bolus 6000 units x 1 Heparin gtt 1550 units/hr Initial HL 0000 Daily HL CBC  Isaac Bliss, PharmD, BCPS, BCCCP Clinical Pharmacist [phone]  Please check AMION for all [facility] numbers  03/01/2018 8:13 PM

## 2018-03-01 NOTE — Discharge Instructions (Addendum)
Take prednisone as prescribed. Use Tamiflu as prescribed. Use Zofran as needed for nausea or vomiting.  Tamiflu often causes increased nausea and vomiting, and Zofran can be used to help prevent this. Use the inhaler every 4 hours for the next 2 days.  After this, use only as needed for wheezing, shortness of breath, or chest tightness. Make sure you are staying well-hydrated water. Return to the emergency room with any new, worsening, concerning symptoms.  Information on my medicine - ELIQUIS (apixaban)  This medication education was reviewed with me or my healthcare representative as part of my discharge preparation.  The pharmacist that spoke with me during my hospital stay was:  Elwin Sleight, [facility]  Why was Eliquis prescribed for you? Eliquis was prescribed to treat blood clots that may have been found in the veins of your legs (deep vein thrombosis) or in your lungs (pulmonary embolism) and to reduce the risk of them occurring again.  What do You need to know about Eliquis ? The starting dose is 10 mg (two 5 mg tablets) taken TWICE daily for the FIRST SEVEN (7) DAYS,  the dose is reduced to ONE 5 mg tablet taken TWICE daily.  Eliquis may be taken with or without food.   Try to take the dose about the same time in the morning and in the evening. If you have difficulty swallowing the tablet whole please discuss with your pharmacist how to take the medication safely.  Take Eliquis exactly as prescribed and DO NOT stop taking Eliquis without talking to the doctor who prescribed the medication.  Stopping may increase your risk of developing a new blood clot.  Refill your prescription before you run out.  After discharge, you should have regular check-up appointments with your healthcare provider that is prescribing your Eliquis.    What do you do if you miss a dose? If a dose of ELIQUIS is not taken at the scheduled time, take it as soon as possible on the same day and twice-daily  administration should be resumed. The dose should not be doubled to make up for a missed dose.  Important Safety Information A possible side effect of Eliquis is bleeding. You should call your healthcare provider right away if you experience any of the following: ? Bleeding from an injury or your nose that does not stop. ? Unusual colored urine (red or dark brown) or unusual colored stools (red or black). ? Unusual bruising for unknown reasons. ? A serious fall or if you hit your head (even if there is no bleeding).  Some medicines may interact with Eliquis and might increase your risk of bleeding or clotting while on Eliquis. To help avoid this, consult your healthcare provider or pharmacist prior to using any new prescription or non-prescription medications, including herbals, vitamins, non-steroidal anti-inflammatory drugs (NSAIDs) and supplements.  This website has more information on Eliquis (apixaban): http://www.eliquis.com/eliquis/home

## 2018-03-01 NOTE — ED Notes (Signed)
Pt moving in chair.  O2 sats dropping to 90%.  Placed on 2L [place].  Sats did not increase until placed on 4L [place].  Increased to 97%.

## 2018-03-02 ENCOUNTER — Inpatient Hospital Stay (HOSPITAL_COMMUNITY): Payer: Self-pay

## 2018-03-02 ENCOUNTER — Encounter (HOSPITAL_COMMUNITY): Payer: Self-pay | Admitting: Internal Medicine

## 2018-03-02 ENCOUNTER — Other Ambulatory Visit: Payer: Self-pay

## 2018-03-02 ENCOUNTER — Encounter (HOSPITAL_COMMUNITY): Payer: Self-pay

## 2018-03-02 ENCOUNTER — Other Ambulatory Visit (HOSPITAL_COMMUNITY): Payer: Self-pay

## 2018-03-02 DIAGNOSIS — I10 Essential (primary) hypertension: Secondary | ICD-10-CM | POA: Diagnosis present

## 2018-03-02 DIAGNOSIS — D649 Anemia, unspecified: Secondary | ICD-10-CM | POA: Diagnosis present

## 2018-03-02 DIAGNOSIS — K7010 Alcoholic hepatitis without ascites: Secondary | ICD-10-CM

## 2018-03-02 DIAGNOSIS — R4182 Altered mental status, unspecified: Secondary | ICD-10-CM

## 2018-03-02 LAB — CBC
HCT: 34 % — ABNORMAL LOW (ref 39.0–52.0)
HEMOGLOBIN: 11.5 g/dL — AB (ref 13.0–17.0)
MCH: 30.9 pg (ref 26.0–34.0)
MCHC: 33.8 g/dL (ref 30.0–36.0)
MCV: 91.4 fL (ref 80.0–100.0)
Platelets: 102 10*3/uL — ABNORMAL LOW (ref 150–400)
RBC: 3.72 MIL/uL — ABNORMAL LOW (ref 4.22–5.81)
RDW: 16.8 % — AB (ref 11.5–15.5)
WBC: 6.4 10*3/uL (ref 4.0–10.5)
nRBC: 0 % (ref 0.0–0.2)

## 2018-03-02 LAB — URINALYSIS, ROUTINE W REFLEX MICROSCOPIC
BILIRUBIN URINE: NEGATIVE
Bacteria, UA: NONE SEEN
Glucose, UA: NEGATIVE mg/dL
Ketones, ur: 5 mg/dL — AB
LEUKOCYTES UA: NEGATIVE
Nitrite: NEGATIVE
Protein, ur: NEGATIVE mg/dL
Specific Gravity, Urine: 1.041 — ABNORMAL HIGH (ref 1.005–1.030)
pH: 5 (ref 5.0–8.0)

## 2018-03-02 LAB — HEPARIN LEVEL (UNFRACTIONATED)
Heparin Unfractionated: 0.63 IU/mL (ref 0.30–0.70)
Heparin Unfractionated: 0.59 IU/mL (ref 0.30–0.70)

## 2018-03-02 LAB — HIV ANTIBODY (ROUTINE TESTING W REFLEX): HIV Screen 4th Generation wRfx: NONREACTIVE

## 2018-03-02 LAB — RAPID URINE DRUG SCREEN, HOSP PERFORMED
Amphetamines: NOT DETECTED
Barbiturates: NOT DETECTED
Benzodiazepines: POSITIVE — AB
COCAINE: NOT DETECTED
Opiates: NOT DETECTED
Tetrahydrocannabinol: NOT DETECTED

## 2018-03-02 LAB — STREP PNEUMONIAE URINARY ANTIGEN: Strep Pneumo Urinary Antigen: NEGATIVE

## 2018-03-02 MED ORDER — PNEUMOCOCCAL VAC POLYVALENT 25 MCG/0.5ML IJ INJ: 0.5000 mL | INJECTION | INTRAMUSCULAR | Status: DC

## 2018-03-02 MED ORDER — TECHNETIUM TO 99M ALBUMIN AGGREGATED
4.3300 | Freq: Once | INTRAVENOUS | Status: AC | PRN
  Administered 2018-03-02: 4.33 via INTRAVENOUS

## 2018-03-02 MED ORDER — INFLUENZA VAC SPLIT QUAD 0.5 ML IM SUSY: 0.5000 mL | PREFILLED_SYRINGE | INTRAMUSCULAR | Status: DC

## 2018-03-02 MED ORDER — TECHNETIUM TC 99M DIETHYLENETRIAME-PENTAACETIC ACID
32.3000 | Freq: Once | INTRAVENOUS | Status: AC | PRN
  Administered 2018-03-02: 32.3 via RESPIRATORY_TRACT

## 2018-03-02 NOTE — Progress Notes (Signed)
ANTICOAGULATION CONSULT NOTE - Follow-up Consult  Pharmacy Consult for heparin Indication: pulmonary embolus  No Known Allergies  Patient Measurements: Height: 5\' 7"  (170.2 cm) Weight: 225 lb 4.8 oz (102.2 kg) IBW/kg (Calculated) : 66.1 Heparin Dosing Weight: 90 kg  Vital Signs: Temp: 98.5 F (36.9 C) (02/07 2209) Temp Source: Oral (02/07 2209) BP: 156/95 (02/08 0000) Pulse Rate: 113 (02/08 0000)  Labs: Recent Labs    03/01/18 1358 03/01/18 1619 03/01/18 1645 03/01/18 1947 03/02/18 0014  HGB 11.8*  --   --   --  11.5*  HCT 34.3*  --   --   --  34.0*  PLT 104*  --   --   --  102*  LABPROT  --  13.2  --   --   --   INR  --  1.01  --   --   --   HEPARINUNFRC  --   --   --   --  0.63  CREATININE 1.05  --   --   --   --   TROPONINI  --   --  <0.03 <0.03  --     Estimated Creatinine Clearance: 101.2 mL/min (by C-G formula based on SCr of 1.05 mg/dL).  Assessment: 48 yo M on heparin for PE with evidence of R heart strain. Heparin level therapeutic (0.63) on gtt at 1550 units/hr. No bleeding noted. Hgb stable, plt low but stable ~100.  Goal of Therapy:  Heparin level 0.3-0.7 units/ml Monitor platelets by anticoagulation protocol: Yes   Plan:  Continue heparin gtt 1550 units/hr Will f/u 6h confirmatory heparin level  Christoper Fabian, PharmD, BCPS Clinical pharmacist  **Pharmacist phone directory can now be found on [email or web address] ([email or web address]).  Listed under [facility]. 03/02/2018 1:58 AM

## 2018-03-02 NOTE — Progress Notes (Signed)
[facility] PROGRESS NOTE  Daniel [name] ZOX:[id number] DOB: 23-Oct-1970 DOA: 03/01/2018 PCP: Patient, No Pcp Per  Assessment/Plan: 1. Acute hypoxic respiratory failure, secondary to PE, also concern for potential asthma exacerbation. Per pulmonary consultants will obtain, V/q to confirm, also obtaining TTE.  Continue scheduled nebs, holding off on steroids (no current wheezing. procal unremarkable, flu panel and s. pna negative less likely infection.  Incentive spirometry for supportive care, wean oxygen as able, pending sputum, legionella cx. Monitoring blood cultures  2. HTN. History but not been on lisinopril due to finances  3. Alcohol abuse. Admits to drinking 2 40 oz beers 5 days a week. Only 1 beer day before admission.  Having some tachycardia can be related to PE or potential withdrawal symptoms.  Monitor on CIWA protocol  4. Elevated AST and ALT, likely alcoholic hepatitis.  Closely monitor LFTs.  5. Thrombocytopenia.  Neck related to alcohol abuse.  No known history of cirrhosis.  INR within normal limits, no ascites or stigmata of potential end stage liver disease on exam.  No active signs or symptoms of bleeding, tolerating heparin  6. Chronic normocytic anemia.  Hemoglobin stable.  Continue monitor daily CBC.  Code Status: Full Code Family Communication: no family at bedside  Disposition Plan: V/q scan, CIWA protocol, wean oxygen still requiring inpatient admission   Consultants:  PUlmonary  Procedures:  none  Antibiotics:  none   HPI/Subjective:  Daniel [name] is a 48 y.o. year old male with medical history significant for alcohol abuse, HTN, tobacco abuse, asthma who presented on 03/01/2018 with 2 days of muscle aches, productive cough and worsening shortness of breath at rest ( with no chest pain) and was found to have acute hypoxic respiratory failure presumed secondary to PE  This a.m. patient states his breathing is much better.  Still having some cough  with slight production.  Denies any fevers or chills.  No chest pain..   Objective: Vitals:   03/02/18 0142 03/02/18 0555  BP:  (!) 137/97  Pulse:    Resp:    Temp:    SpO2: 99%     Intake/Output Summary (Last 24 hours) at 03/02/2018 0747 Last data filed at 03/02/2018 0231 Gross per 24 hour  Intake 853.59 ml  Output -  Net 853.59 ml   Filed Weights   03/01/18 1745 03/01/18 2202  Weight: 99.8 kg 102.2 kg    Exam:   General: male, no distress  Cardiovascular: tachycardic, no murmurs, rubs or gallops  Respiratory: normal respiratory effort on 2L [place], no crackles or wheezes heard  Abdomen: soft abdomen, normal bowel sounds, non-tender.  Musculoskeletal: normal range of motion   Skin no rashes or lesions  Neurologic alert and oriented x 4, no appreciable focal deficits  Data Reviewed: Basic Metabolic Panel: Recent Labs  Lab 03/01/18 1358  NA 136  K 4.0  CL 94*  CO2 22  GLUCOSE 98  BUN 12  CREATININE 1.05  CALCIUM 8.7*   Liver Function Tests: Recent Labs  Lab 03/01/18 1358  AST 105*  ALT 50*  ALKPHOS 84  BILITOT 1.8*  PROT 7.6  ALBUMIN 4.1   No results for input(s): LIPASE, AMYLASE in the last 168 hours. No results for input(s): AMMONIA in the last 168 hours. CBC: Recent Labs  Lab 03/01/18 1358 03/02/18 0014  WBC 6.9 6.4  NEUTROABS 5.0  --   HGB 11.8* 11.5*  HCT 34.3* 34.0*  MCV 91.7 91.4  PLT 104* 102*   Cardiac Enzymes:  Recent Labs  Lab 03/01/18 1645 03/01/18 1947  TROPONINI <0.03 <0.03   BNP (last 3 results) Recent Labs    03/01/18 1947  BNP 63.4    ProBNP (last 3 results) No results for input(s): PROBNP in the last 8760 hours.  CBG: No results for input(s): GLUCAP in the last 168 hours.  Recent Results (from the past 240 hour(s))  Culture, sputum-assessment     Status: None   Collection Time: 03/01/18 10:47 PM  Result Value Ref Range Status   Specimen Description EXPECTORATED SPUTUM  Final   Special Requests NONE   Final   Sputum evaluation   Final    THIS SPECIMEN IS ACCEPTABLE FOR SPUTUM CULTURE Performed at [facility], [address], [place], [place] [address]    Report Status 03/01/2018 FINAL  Final  Culture, respiratory     Status: None (Preliminary result)   Collection Time: 03/01/18 10:47 PM  Result Value Ref Range Status   Specimen Description EXPECTORATED SPUTUM  Final   Special Requests NONE Reflexed from [id number]  Final   Gram Stain   Final    ABUNDANT WBC PRESENT, PREDOMINANTLY PMN FEW GRAM POSITIVE COCCI RARE GRAM NEGATIVE RODS RARE GRAM POSITIVE RODS Performed at [facility], [address], [place], [place] [address]    Culture PENDING  Incomplete   Report Status PENDING  Incomplete     Studies: Dg Chest 2 View  Result Date: 03/01/2018 CLINICAL DATA:  Cough and fever for 2 days. EXAM: CHEST - 2 VIEW COMPARISON:  08/15/2017 FINDINGS: The cardiac silhouette, mediastinal and hilar contours are normal in stable. The lungs are clear. No pleural effusion. No worrisome pulmonary lesions. The bony thorax is intact. IMPRESSION: No acute cardiopulmonary findings. Electronically Signed   By: Rudie Meyer M.D.   On: 03/01/2018 14:23   Ct Angio Chest Pe W/cm &/or Wo Cm  Result Date: 03/01/2018 CLINICAL DATA:  Acute onset body aches, fever and chills yesterday. Tachycardia. History of asthma. EXAM: CT ANGIOGRAPHY CHEST WITH CONTRAST TECHNIQUE: Multidetector CT imaging of the chest was performed using the standard protocol during bolus administration of intravenous contrast. Multiplanar CT image reconstructions and MIPs were obtained to evaluate the vascular anatomy. CONTRAST:  63mL ISOVUE-370 IOPAMIDOL (ISOVUE-370) INJECTION 76% COMPARISON:  Chest radiograph March 01, 2018. CT chest report dated August 15, 2017 though images are not available for direct comparison. FINDINGS: CARDIOVASCULAR: Suboptimal contrast opacification of the pulmonary artery's (main pulmonary artery is 167  Hounsfield units, target is 250 Hounsfield units. Main pulmonary artery is mildly enlarged at 3.1 cm. Lingular lobar LEFT upper lobe segmental to subsegmental central pulmonary arterial filling defects. RIGHT lower lobe segmental to subsegmental nonocclusive pulmonary emboli. RIGHT middle lobe to segmental emboli. Heart size is normal. RIGHT heart strain (RV/LV equals 1.1). No pericardial effusion. Thoracic aorta is normal course and caliber, unremarkable. MEDIASTINUM/NODES: No lymphadenopathy by CT size criteria. LUNGS/PLEURA: Tracheobronchial tree is patent, no pneumothorax. Mild bronchial wall thickening. Subcentimeter nodular densities RIGHT lower lobe medial segment. Patchy ground-glass opacity and small consolidation LEFT lower lobe sparing the periphery. Minimal LEFT upper lobe tree-in-bud infiltrates. No pleural effusion. UPPER ABDOMEN: Non-acute. Hypodense liver most compatible with steatosis. MUSCULOSKELETAL: Non-acute. Mild thoracic spondylosis. Gynecomastia. Moderate RIGHT acromioclavicular osteoarthrosis. Review of the MIP images confirms the above findings. IMPRESSION: 1. Acute bilateral occlusive and nonocclusive pulmonary emboli predominantly in segmental to subsegmental branches. CT evidence of right heart strain (RV/LV Ratio = 1.1) consistent with at least submassive (intermediate risk) PE.  The presence of right heart strain has been associated with an increased risk of morbidity and mortality. Please activate Code PE by paging [phone]. 2. Bronchial wall thickening and scattered consolidations concerning for bronchopneumonia or aspiration, less likely septic emboli. No pulmonary infarcts. 3. Critical Value/emergent results were called by telephone at the time of interpretation on 03/01/2018 at 4:59 pm to PA. Aviva KluverALYSSA MURRAY , who verbally acknowledged these results. Electronically Signed   By: Awilda Metroourtnay  Bloomer M.D.   On: 03/01/2018 17:02    Scheduled Meds: . chlordiazePOXIDE  5 mg Oral TID   . [START ON 03/03/2018] Influenza vac split quadrivalent PF  0.5 mL Intramuscular Tomorrow-1000  . ipratropium  0.5 mg Nebulization Q6H  . LORazepam  0-4 mg Intravenous Q6H   Or  . LORazepam  0-4 mg Oral Q6H  . [START ON 03/04/2018] LORazepam  0-4 mg Intravenous Q12H   Or  . [START ON 03/04/2018] LORazepam  0-4 mg Oral Q12H  . nicotine  21 mg Transdermal Daily  . [START ON 03/03/2018] pneumococcal 23 valent vaccine  0.5 mL Intramuscular Tomorrow-1000  . thiamine  100 mg Oral Daily   Or  . thiamine  100 mg Intravenous Daily   Continuous Infusions: . heparin 1,550 Units/hr (03/02/18 0736)    Principal Problem:   Acute respiratory failure with hypoxia (HCC) Active Problems:   Alcohol abuse   Tobacco abuse   PE (pulmonary thromboembolism) (HCC)   Thrombocytopenia (HCC)   Exacerbation of asthma   HTN (hypertension)      Laverna PeaceShayla D Donoven Pett  [facility]

## 2018-03-02 NOTE — Progress Notes (Signed)
NAME:  Daniel [name], MRN:  [id number], DOB:  11-May-1970, LOS: 1 ADMISSION DATE:  03/01/2018, CONSULTATION DATE:  2/7 REFERRING MD:  2/7, CHIEF COMPLAINT:  PE  Brief History   48 year old male who presented with flulike symptoms on 2/7.  CTA concerning for submassive PE.  Bedside echo in ED negative for RV strain, however, appears to be experiencing alcohol withdrawal and endorses frequent increase and then stopping of alcohol use.  History of present illness   48 year old male with past medical history as below, which is significant for hypertension and asthma.  He was in his usual state of health until about 2/6 when he developed flulike symptoms including body aches and shortness of breath.  Specifically leg aches.  He also reports that he had been drinking more frequently as of late and on 2/6 only had one beer.  Upon arrival to the emergency department he was found to be hypoxic to 88% on room air and tachycardic into the 140s.  This raise concern for pulmonary embolism and he was admitted to the CT scanner which demonstrated positive pulmonary embolism with RV strain.  PCCM was asked to see in evaluation.  Past Medical History   has a past medical history of Asthma, Asthma, and Hypertension.  Significant Hospital Events   2/7 admit  Consults:  Pulm  Procedures:    Significant Diagnostic Tests:  CTA chest 2/7> suboptimal contrast bolus.  Acute bilateral occlusive and nonocclusive pulmonary emboli predominantly in segmental to subsegmental branches. CT evidence of right heart strain (RV/LV Ratio = 1.1). Bronchial wall thickening and scattered consolidations concerning for bronchopneumonia or aspiration, less likely septic emboli. No pulmonary infarcts.   VQ scan 2/8- intermediate probability for pulmonary embolism.  Micro Data:    Antimicrobials:    Interim history/subjective:  No significant events.  Stable on room air.  Objective   Blood pressure (!) 137/97, pulse (!)  107, temperature 98.5 F (36.9 C), temperature source Oral, resp. rate (!) 27, height 5\' 7"  (1.702 m), weight 102.2 kg, SpO2 99 %.    FiO2 (%):  [28 %] 28 %   Intake/Output Summary (Last 24 hours) at 03/02/2018 1121 Last data filed at 03/02/2018 0900 Gross per 24 hour  Intake 1093.59 ml  Output 25 ml  Net 1068.59 ml   Filed Weights   03/01/18 1745 03/01/18 2202  Weight: 99.8 kg 102.2 kg   Examination: Gen:      No acute distress HEENT:  EOMI, sclera anicteric Neck:     No masses; no thyromegaly Lungs:    Clear to auscultation bilaterally; normal respiratory effort CV:         Regular rate and rhythm; no murmurs Abd:      + bowel sounds; soft, non-tender; no palpable masses, no distension Ext:    No edema; adequate peripheral perfusion Skin:      Warm and dry; no rash Neuro: alert and oriented x 3 Psych: normal mood and affect  Resolved Hospital Problem list     Assessment & Plan:   Pulmonary emboli: Submassive PE, good RV function on bedside echo with negative troponins Reviewed the scan with Dr. Loreta Ave from radiology.  Although the contrast bolus is suboptimal there is clear evidence of pulmonary embolism Even though the VQ scan is intermediate we have enough evidence of evidence that he did indeed suffer a pulmonary embolism  Continue heparin Not a candidate for EKOS or TPA  PCCM will be available as needed. Please call with questions.  Labs   CBC: Recent Labs  Lab 03/01/18 1358 03/02/18 0014  WBC 6.9 6.4  NEUTROABS 5.0  --   HGB 11.8* 11.5*  HCT 34.3* 34.0*  MCV 91.7 91.4  PLT 104* 102*    Basic Metabolic Panel: Recent Labs  Lab 03/01/18 1358  NA 136  K 4.0  CL 94*  CO2 22  GLUCOSE 98  BUN 12  CREATININE 1.05  CALCIUM 8.7*   GFR: Estimated Creatinine Clearance: 101.2 mL/min (by C-G formula based on SCr of 1.05 mg/dL). Recent Labs  Lab 03/01/18 1358 03/01/18 1619 03/01/18 1947 03/01/18 2002 03/02/18 0014  PROCALCITON  --   --   --  0.20  --    WBC 6.9  --   --   --  6.4  LATICACIDVEN  --  1.7 1.9  --   --     Liver Function Tests: Recent Labs  Lab 03/01/18 1358  AST 105*  ALT 50*  ALKPHOS 84  BILITOT 1.8*  PROT 7.6  ALBUMIN 4.1   No results for input(s): LIPASE, AMYLASE in the last 168 hours. No results for input(s): AMMONIA in the last 168 hours.  ABG    Component Value Date/Time   TCO2 29 10/03/2014 1549     Coagulation Profile: Recent Labs  Lab 03/01/18 1619  INR 1.01    Cardiac Enzymes: Recent Labs  Lab 03/01/18 1645 03/01/18 1947  TROPONINI <0.03 <0.03    HbA1C: No results found for: HGBA1C  CBG: No results for input(s): GLUCAP in the last 168 hours.  Chilton Greathouse MD [facility] Pager [phone] If no answer call [phone] 03/03/2018, 2:01 PM

## 2018-03-02 NOTE — Plan of Care (Signed)
  Problem: Clinical Measurements: Goal: Respiratory complications will improve Outcome: Progressing   

## 2018-03-02 NOTE — Progress Notes (Signed)
ANTICOAGULATION CONSULT NOTE - Follow-up Consult  Pharmacy Consult for heparin Indication: pulmonary embolus  No Known Allergies  Patient Measurements: Height: 5\' 7"  (170.2 cm) Weight: 225 lb 4.8 oz (102.2 kg) IBW/kg (Calculated) : 66.1 Heparin Dosing Weight: 90 kg  Vital Signs: BP: 137/97 (02/08 0555) Pulse Rate: 107 (02/08 0900)  Labs: Recent Labs    03/01/18 1358 03/01/18 1619 03/01/18 1645 03/01/18 1947 03/02/18 0014 03/02/18 0739  HGB 11.8*  --   --   --  11.5*  --   HCT 34.3*  --   --   --  34.0*  --   PLT 104*  --   --   --  102*  --   LABPROT  --  13.2  --   --   --   --   INR  --  1.01  --   --   --   --   HEPARINUNFRC  --   --   --   --  0.63 0.59  CREATININE 1.05  --   --   --   --   --   TROPONINI  --   --  <0.03 <0.03  --   --     Estimated Creatinine Clearance: 101.2 mL/min (by C-G formula based on SCr of 1.05 mg/dL).  Assessment: 48 yo M on heparin for possible PE with evidence of R heart strain on CT (per MD notes need to confirm with VQ or repeat CT) . Heparin level therapeutic (0.59) on gtt at 1550 units/hr. Hgb stable, plt low but stable ~100.  Goal of Therapy:  Heparin level 0.3-0.7 units/ml Monitor platelets by anticoagulation protocol: Yes   Plan:  Continue heparin gtt 1550 units/hr Daily heparin level and CBC  Harland German, PharmD Clinical Pharmacist **Pharmacist phone directory can now be found on [email or web address] ([email or web address]).  Listed under [facility].

## 2018-03-03 ENCOUNTER — Inpatient Hospital Stay (HOSPITAL_COMMUNITY): Payer: Self-pay

## 2018-03-03 DIAGNOSIS — J9601 Acute respiratory failure with hypoxia: Secondary | ICD-10-CM

## 2018-03-03 DIAGNOSIS — D649 Anemia, unspecified: Secondary | ICD-10-CM

## 2018-03-03 DIAGNOSIS — R69 Illness, unspecified: Secondary | ICD-10-CM

## 2018-03-03 DIAGNOSIS — I2699 Other pulmonary embolism without acute cor pulmonale: Secondary | ICD-10-CM

## 2018-03-03 LAB — COMPREHENSIVE METABOLIC PANEL
ALT: 36 U/L (ref 0–44)
AST: 63 U/L — ABNORMAL HIGH (ref 15–41)
Albumin: 3.2 g/dL — ABNORMAL LOW (ref 3.5–5.0)
Alkaline Phosphatase: 64 U/L (ref 38–126)
Anion gap: 20 — ABNORMAL HIGH (ref 5–15)
BUN: 10 mg/dL (ref 6–20)
CO2: 26 mmol/L (ref 22–32)
Calcium: 8.3 mg/dL — ABNORMAL LOW (ref 8.9–10.3)
Chloride: 91 mmol/L — ABNORMAL LOW (ref 98–111)
Creatinine, Ser: 0.9 mg/dL (ref 0.61–1.24)
GFR calc Af Amer: >60 mL/min (ref >60)
GFR calc non Af Amer: >60 mL/min (ref >60)
Glucose, Bld: 94 mg/dL (ref 70–99)
POTASSIUM: 3.1 mmol/L — AB (ref 3.5–5.1)
Sodium: 137 mmol/L (ref 135–145)
Total Bilirubin: 1 mg/dL (ref 0.3–1.2)
Total Protein: 7.1 g/dL (ref 6.5–8.1)

## 2018-03-03 LAB — CBC
HCT: 35.1 % — ABNORMAL LOW (ref 39.0–52.0)
Hemoglobin: 11.7 g/dL — ABNORMAL LOW (ref 13.0–17.0)
MCH: 30.6 pg (ref 26.0–34.0)
MCHC: 33.3 g/dL (ref 30.0–36.0)
MCV: 91.9 fL (ref 80.0–100.0)
Platelets: 97 10*3/uL — ABNORMAL LOW (ref 150–400)
RBC: 3.82 MIL/uL — ABNORMAL LOW (ref 4.22–5.81)
RDW: 16.6 % — ABNORMAL HIGH (ref 11.5–15.5)
WBC: 6.4 10*3/uL (ref 4.0–10.5)
nRBC: 0 % (ref 0.0–0.2)

## 2018-03-03 LAB — MAGNESIUM: Magnesium: 1.1 mg/dL — ABNORMAL LOW (ref 1.7–2.4)

## 2018-03-03 LAB — HEPARIN LEVEL (UNFRACTIONATED): Heparin Unfractionated: 0.51 IU/mL (ref 0.30–0.70)

## 2018-03-03 LAB — LEGIONELLA PNEUMOPHILA SEROGP 1 UR AG: L. pneumophila Serogp 1 Ur Ag: NEGATIVE

## 2018-03-03 LAB — ECHOCARDIOGRAM COMPLETE
Height: 67 in
WEIGHTICAEL: 3604.8 [oz_av]

## 2018-03-03 MED ORDER — BENZONATATE 100 MG PO CAPS
100.0000 mg | ORAL_CAPSULE | Freq: Two times a day (BID) | ORAL | Status: DC
  Administered 2018-03-03 – 2018-03-04 (×2): 100 mg via ORAL
  Filled 2018-03-03 (×2): qty 1

## 2018-03-03 MED ORDER — LORAZEPAM 2 MG/ML IJ SOLN: 2.0000 mg | INTRAMUSCULAR | Status: DC | PRN

## 2018-03-03 MED ORDER — IPRATROPIUM BROMIDE 0.02 % IN SOLN
0.5000 mg | Freq: Two times a day (BID) | RESPIRATORY_TRACT | Status: DC
  Administered 2018-03-03: 0.5 mg via RESPIRATORY_TRACT
  Filled 2018-03-03: qty 2.5

## 2018-03-03 MED ORDER — POTASSIUM CHLORIDE CRYS ER 20 MEQ PO TBCR
40.0000 meq | EXTENDED_RELEASE_TABLET | Freq: Two times a day (BID) | ORAL | Status: AC
  Administered 2018-03-03 (×2): 40 meq via ORAL
  Filled 2018-03-03 (×2): qty 2

## 2018-03-03 MED ORDER — ADULT MULTIVITAMIN W/MINERALS CH
1.0000 | ORAL_TABLET | Freq: Every day | ORAL | Status: DC
  Administered 2018-03-03 – 2018-03-04 (×2): 1 via ORAL
  Filled 2018-03-03 (×2): qty 1

## 2018-03-03 MED ORDER — FOLIC ACID 1 MG PO TABS
1.0000 mg | ORAL_TABLET | Freq: Every day | ORAL | Status: DC
  Administered 2018-03-03 – 2018-03-04 (×2): 1 mg via ORAL
  Filled 2018-03-03 (×2): qty 1

## 2018-03-03 MED ORDER — MAGNESIUM SULFATE 2 GM/50ML IV SOLN
2.0000 g | INTRAVENOUS | Status: AC
  Administered 2018-03-03 (×3): 2 g via INTRAVENOUS
  Filled 2018-03-03 (×3): qty 50

## 2018-03-03 MED ORDER — POTASSIUM CHLORIDE 20 MEQ PO PACK
40.0000 meq | PACK | Freq: Two times a day (BID) | ORAL | Status: DC
  Filled 2018-03-03: qty 2

## 2018-03-03 NOTE — Progress Notes (Signed)
Lower extremity venous duplex has been completed.   Preliminary results in CV Proc.   Blanch Media 03/03/2018 2:56 PM

## 2018-03-03 NOTE — Progress Notes (Signed)
  Echocardiogram 2D Echocardiogram has been performed.  Daniel SavoyCasey N Indi [name] 03/03/2018, 2:36 PM

## 2018-03-03 NOTE — Progress Notes (Signed)
ANTICOAGULATION CONSULT NOTE - Follow-up Consult  Pharmacy Consult for heparin Indication: pulmonary embolus  No Known Allergies  Patient Measurements: Height: 5\' 7"  (170.2 cm) Weight: 225 lb 4.8 oz (102.2 kg) IBW/kg (Calculated) : 66.1 Heparin Dosing Weight: 90 kg  Vital Signs: BP: 145/94 (02/09 0100) Pulse Rate: 96 (02/09 0620)  Labs: Recent Labs    03/01/18 1358 03/01/18 1619 03/01/18 1645 03/01/18 1947 03/02/18 0014 03/02/18 0739 03/03/18 0347  HGB 11.8*  --   --   --  11.5*  --  11.7*  HCT 34.3*  --   --   --  34.0*  --  35.1*  PLT 104*  --   --   --  102*  --  97*  LABPROT  --  13.2  --   --   --   --   --   INR  --  1.01  --   --   --   --   --   HEPARINUNFRC  --   --   --   --  0.63 0.59 0.51  CREATININE 1.05  --   --   --   --   --  0.90  TROPONINI  --   --  <0.03 <0.03  --   --   --     Estimated Creatinine Clearance: 118 mL/min (by C-G formula based on SCr of 0.9 mg/dL).  Assessment: 48 yo M on heparin for possible PE with evidence of R heart strain on CT (per MD notes need to confirm with VQ or repeat CT) . Heparin level therapeutic (0.51) on gtt at 1550 units/hr. Hgb stable, plt low but stable at 97.  Goal of Therapy:  Heparin level 0.3-0.7 units/ml Monitor platelets by anticoagulation protocol: Yes   Plan:  Continue heparin gtt 1550 units/hr Daily heparin level and CBC  Harland German, PharmD Clinical Pharmacist **Pharmacist phone directory can now be found on [email or web address] ([email or web address]).  Listed under [facility].

## 2018-03-03 NOTE — Progress Notes (Signed)
[facility] PROGRESS NOTE  Daniel AntuKeith T Fredericksen GNF:[id number] DOB: [birth year] DOA: 03/01/2018 PCP: Patient, No Pcp Per  Assessment/Plan: 1. Acute hypoxic respiratory failure, secondary to PE, also concern for potential asthma exacerbation. CTA confirmed bilateral PE in segmental to subsegmental PE. V/q intermed probability. TTE w/ wnl RV systolic function. Doing well on room air. Plan for ambulatory oxygen test.  Continue scheduled nebs for some possible asthma exacerbation,  (no current wheezing. procal unremarkable, flu panel and s. pna negative less likely infection).  Incentive spirometry for supportive care, wean oxygen as able, wnl sputum, legionella cx. Monitoring blood cultures  2. Flu like illness. As above influenza, sputum cx, legionella, s.pna, all unremarkable. Supportive care IS, mucinex, tessalon  3. Sinus Tachycardia. Could be contributed to PE as well as alcohol withdrawal  4. Fever. 101.4 yesterday evening. Blood culture negative, procal unremarkable. Less likely infection could be inflammatory response to PE. Blood cultures if repeat PE  5. Hypomagnesemia. Currently 1.1. Likely related to Etoh abuse. Replete with IV and monitor  6. HTN. History but not been on lisinopril due to finances  7. Alcohol abuse. Admits to drinking 2 40 oz beers 5 days a week. Only 1 beer day before admission.  Having some tachycardia can be related to PE or potential withdrawal symptoms.  Monitor on CIWA protocol PRN ativan, scheduled librium, folic acid, thiamine, multivitamin  8. Elevated AST and ALT, likely alcoholic hepatitis, improving.  Closely monitor LFTs.  9. Thrombocytopenia.  Neck related to alcohol abuse.  No known history of cirrhosis.  INR within normal limits, no ascites or stigmata of potential end stage liver disease on exam.  No active signs or symptoms of bleeding, tolerating heparin  10. Chronic normocytic anemia.  Hemoglobin stable.  Continue monitor daily CBC.  Code Status:  Full Code Family Communication: no family at bedside  Disposition Plan:  CIWA protocol, replete magnesium, sinus tachycardia, will monitor on heparin and switch to potential NOAC(c/m consult given financial constraints)  Consultants:  PUlmonary  Procedures:  none  Antibiotics:  none   HPI/Subjective:  Daniel [name] is a 48 y.o. year old male with medical history significant for alcohol abuse, HTN, tobacco abuse, asthma who presented on 03/01/2018 with 2 days of muscle aches, productive cough and worsening shortness of breath at rest ( with no chest pain) and was found to have acute hypoxic respiratory failure presumed secondary to PE  Continues to have improvement in breathing this am. No cp, no fevers or chills. Noticed some slight redness in urine. Increase mucus production and nasal congestion   Objective: Vitals:   03/03/18 0620 03/03/18 0800  BP:    Pulse: 96   Resp:  18  Temp:    SpO2:      Intake/Output Summary (Last 24 hours) at 03/03/2018 1511 Last data filed at 03/03/2018 1216 Gross per 24 hour  Intake 880.45 ml  Output 650 ml  Net 230.45 ml   Filed Weights   03/01/18 1745 03/01/18 2202  Weight: 99.8 kg 102.2 kg    Exam:   General: male, no distress  Cardiovascular: tachycardic, no murmurs, rubs or gallops  Respiratory: normal respiratory effort onroom air, no crackles or wheezes heard  Abdomen: soft abdomen, normal bowel sounds, non-tender.  Musculoskeletal: normal range of motion   Skin no rashes or lesions  Neurologic alert and oriented x 4, no appreciable focal deficits  Psych: slow speech, flat affect, normal mood  Data Reviewed: Basic Metabolic Panel: Recent Labs  Lab 03/01/18  1358 03/03/18 0347  NA 136 137  K 4.0 3.1*  CL 94* 91*  CO2 22 26  GLUCOSE 98 94  BUN 12 10  CREATININE 1.05 0.90  CALCIUM 8.7* 8.3*  MG  --  1.1*   Liver Function Tests: Recent Labs  Lab 03/01/18 1358 03/03/18 0347  AST 105* 63*  ALT 50* 36   ALKPHOS 84 64  BILITOT 1.8* 1.0  PROT 7.6 7.1  ALBUMIN 4.1 3.2*   No results for input(s): LIPASE, AMYLASE in the last 168 hours. No results for input(s): AMMONIA in the last 168 hours. CBC: Recent Labs  Lab 03/01/18 1358 03/02/18 0014 03/03/18 0347  WBC 6.9 6.4 6.4  NEUTROABS 5.0  --   --   HGB 11.8* 11.5* 11.7*  HCT 34.3* 34.0* 35.1*  MCV 91.7 91.4 91.9  PLT 104* 102* 97*   Cardiac Enzymes: Recent Labs  Lab 03/01/18 1645 03/01/18 1947  TROPONINI <0.03 <0.03   BNP (last 3 results) Recent Labs    03/01/18 1947  BNP 63.4    ProBNP (last 3 results) No results for input(s): PROBNP in the last 8760 hours.  CBG: No results for input(s): GLUCAP in the last 168 hours.  Recent Results (from the past 240 hour(s))  Culture, blood (routine x 2)     Status: None (Preliminary result)   Collection Time: 03/01/18  4:19 PM  Result Value Ref Range Status   Specimen Description BLOOD LEFT ANTECUBITAL  Final   Special Requests   Final    BOTTLES DRAWN AEROBIC AND ANAEROBIC Blood Culture results may not be optimal due to an inadequate volume of blood received in culture bottles   Culture   Final    NO GROWTH < 24 HOURS Performed at [facility], [address], [place], [place] [address]    Report Status PENDING  Incomplete  Culture, blood (routine x 2)     Status: None (Preliminary result)   Collection Time: 03/01/18  4:51 PM  Result Value Ref Range Status   Specimen Description BLOOD LEFT HAND  Final   Special Requests   Final    BOTTLES DRAWN AEROBIC AND ANAEROBIC Blood Culture adequate volume   Culture   Final    NO GROWTH < 24 HOURS Performed at [facility], [address], [place], [place] [address]    Report Status PENDING  Incomplete  Culture, sputum-assessment     Status: None   Collection Time: 03/01/18 10:47 PM  Result Value Ref Range Status   Specimen Description EXPECTORATED SPUTUM  Final   Special Requests NONE  Final   Sputum evaluation    Final    THIS SPECIMEN IS ACCEPTABLE FOR SPUTUM CULTURE Performed at [facility], [address], [place], [place] [address]    Report Status 03/01/2018 FINAL  Final  Culture, respiratory     Status: None (Preliminary result)   Collection Time: 03/01/18 10:47 PM  Result Value Ref Range Status   Specimen Description EXPECTORATED SPUTUM  Final   Special Requests NONE Reflexed from [id number]  Final   Gram Stain   Final    ABUNDANT WBC PRESENT, PREDOMINANTLY PMN FEW GRAM POSITIVE COCCI RARE GRAM NEGATIVE RODS RARE GRAM POSITIVE RODS Performed at [facility], [address], [place], [place] [address]    Culture PENDING  Incomplete   Report Status PENDING  Incomplete     Studies: Ct Angio Chest Pe W/cm &/or Wo Cm  Result  Date: 03/01/2018 CLINICAL DATA:  Acute onset body aches, fever and chills yesterday. Tachycardia. History of asthma. EXAM: CT ANGIOGRAPHY CHEST WITH CONTRAST TECHNIQUE: Multidetector CT imaging of the chest was performed using the standard protocol during bolus administration of intravenous contrast. Multiplanar CT image reconstructions and MIPs were obtained to evaluate the vascular anatomy. CONTRAST:  5mL ISOVUE-370 IOPAMIDOL (ISOVUE-370) INJECTION 76% COMPARISON:  Chest radiograph March 01, 2018. CT chest report dated August 15, 2017 though images are not available for direct comparison. FINDINGS: CARDIOVASCULAR: Suboptimal contrast opacification of the pulmonary artery's (main pulmonary artery is 167 Hounsfield units, target is 250 Hounsfield units. Main pulmonary artery is mildly enlarged at 3.1 cm. Lingular lobar LEFT upper lobe segmental to subsegmental central pulmonary arterial filling defects. RIGHT lower lobe segmental to subsegmental nonocclusive pulmonary emboli. RIGHT middle lobe to segmental emboli. Heart size is normal. RIGHT heart strain (RV/LV equals 1.1). No pericardial effusion. Thoracic aorta is normal course and caliber, unremarkable.  MEDIASTINUM/NODES: No lymphadenopathy by CT size criteria. LUNGS/PLEURA: Tracheobronchial tree is patent, no pneumothorax. Mild bronchial wall thickening. Subcentimeter nodular densities RIGHT lower lobe medial segment. Patchy ground-glass opacity and small consolidation LEFT lower lobe sparing the periphery. Minimal LEFT upper lobe tree-in-bud infiltrates. No pleural effusion. UPPER ABDOMEN: Non-acute. Hypodense liver most compatible with steatosis. MUSCULOSKELETAL: Non-acute. Mild thoracic spondylosis. Gynecomastia. Moderate RIGHT acromioclavicular osteoarthrosis. Review of the MIP images confirms the above findings. IMPRESSION: 1. Acute bilateral occlusive and nonocclusive pulmonary emboli predominantly in segmental to subsegmental branches. CT evidence of right heart strain (RV/LV Ratio = 1.1) consistent with at least submassive (intermediate risk) PE. The presence of right heart strain has been associated with an increased risk of morbidity and mortality. Please activate Code PE by paging [phone]. 2. Bronchial wall thickening and scattered consolidations concerning for bronchopneumonia or aspiration, less likely septic emboli. No pulmonary infarcts. 3. Critical Value/emergent results were called by telephone at the time of interpretation on 03/01/2018 at 4:59 pm to PA. Aviva Kluver , who verbally acknowledged these results. Electronically Signed   By: Awilda Metro M.D.   On: 03/01/2018 17:02   Nm Pulmonary Perf And Vent  Result Date: 03/02/2018 CLINICAL DATA:  Pulmonary embolism by prior CTA chest EXAM: NUCLEAR MEDICINE VENTILATION - PERFUSION LUNG SCAN TECHNIQUE: Ventilation images were obtained in multiple projections using inhaled aerosol Tc-53m DTPA. Perfusion images were obtained in multiple projections after intravenous injection of Tc-69m MAA. RADIOPHARMACEUTICALS:  32.3 mCi of Tc-91m DTPA aerosol inhalation and 4.33 mCi Tc52m MAA IV COMPARISON:  CTA chest and chest radiograph exams of  03/01/2018 FINDINGS: Ventilation: Swallowed esophageal and gastric aerosol. Subsegmental ventilation defects lateral RIGHT RIGHT upper lobe near minor fissure and in superior segment of RIGHT lower lobe. Perfusion: Perfusion defects identified RIGHT upper lobe laterally, superior segment RIGHT lower lobe, LEFT lower lobe, and RIGHT apex. The LEFT apical and LEFT lower lobe defects show no definite accompanying ventilation defects, while the RIGHT upper lobe and superior segment RIGHT lower lobe defects are matching. Findings represent an intermediate probability for pulmonary embolism. However, the preceding CTA chest exam of 1 day prior, while of suboptimal enhancement quality, demonstrated presence of BILATERAL pulmonary emboli. IMPRESSION: More perfusion and ventilation defects in the lungs as above representing an intermediate probability for pulmonary embolism. However, the preceding CTA chest exam of 1 day prior was diagnostic for pulmonary emboli. Electronically Signed   By: Ulyses Southward M.D.   On: 03/02/2018 19:18   Vas [place] Lower Extremity Venous (dvt)  Result Date: 03/03/2018  Lower Venous Study Indications: Pulmonary embolism.  Performing Technologist: Blanch MediaMegan Riddle RVS  Examination Guidelines: A complete evaluation includes B-mode imaging, spectral Doppler, color Doppler, and power Doppler as needed of all accessible portions of each vessel. Bilateral testing is considered an integral part of a complete examination. Limited examinations for reoccurring indications may be performed as noted.  Right Venous Findings: +---------+---------------+---------+-----------+----------+-------+          CompressibilityPhasicitySpontaneityPropertiesSummary +---------+---------------+---------+-----------+----------+-------+ CFV      Full           Yes      Yes                          +---------+---------------+---------+-----------+----------+-------+ SFJ      Full                                                  +---------+---------------+---------+-----------+----------+-------+ FV Prox  Full                                                 +---------+---------------+---------+-----------+----------+-------+ FV Mid   Full                                                 +---------+---------------+---------+-----------+----------+-------+ FV DistalFull                                                 +---------+---------------+---------+-----------+----------+-------+ PFV      Full                                                 +---------+---------------+---------+-----------+----------+-------+ POP      Full           Yes      Yes                          +---------+---------------+---------+-----------+----------+-------+ PTV      Full                                                 +---------+---------------+---------+-----------+----------+-------+ PERO     Full                                                 +---------+---------------+---------+-----------+----------+-------+  Left Venous Findings: +---------+---------------+---------+-----------+----------+-------+          CompressibilityPhasicitySpontaneityPropertiesSummary +---------+---------------+---------+-----------+----------+-------+ CFV      Full           Yes      Yes                          +---------+---------------+---------+-----------+----------+-------+  SFJ      Full                                                 +---------+---------------+---------+-----------+----------+-------+ FV Prox  Full                                                 +---------+---------------+---------+-----------+----------+-------+ FV Mid   Full                                                 +---------+---------------+---------+-----------+----------+-------+ FV DistalFull                                                  +---------+---------------+---------+-----------+----------+-------+ PFV      Full                                                 +---------+---------------+---------+-----------+----------+-------+ POP      Full           Yes      Yes                          +---------+---------------+---------+-----------+----------+-------+ PTV      Full                                                 +---------+---------------+---------+-----------+----------+-------+ PERO     Full                                                 +---------+---------------+---------+-----------+----------+-------+    Summary: Right: There is no evidence of deep vein thrombosis in the lower extremity. No cystic structure found in the popliteal fossa. Left: There is no evidence of deep vein thrombosis in the lower extremity. No cystic structure found in the popliteal fossa.  *See table(s) above for measurements and observations.    Preliminary     Scheduled Meds: . chlordiazePOXIDE  5 mg Oral TID  . folic acid  1 mg Oral Daily  . Influenza vac split quadrivalent PF  0.5 mL Intramuscular Tomorrow-1000  . ipratropium  0.5 mg Nebulization BID  . multivitamin with minerals  1 tablet Oral Daily  . nicotine  21 mg Transdermal Daily  . pneumococcal 23 valent vaccine  0.5 mL Intramuscular Tomorrow-1000  . potassium chloride  40 mEq Oral BID  . thiamine  100 mg Oral Daily   Or  . thiamine  100 mg Intravenous Daily   Continuous Infusions: . heparin 1,550 Units/hr (03/03/18 4098)  Principal Problem:   Acute respiratory failure with hypoxia (HCC) Active Problems:   Alcohol abuse   Tobacco abuse   PE (pulmonary thromboembolism) (HCC)   Thrombocytopenia (HCC)   Exacerbation of asthma   HTN (hypertension)   Alcoholic hepatitis without ascites   Chronic anemia   Hypomagnesemia       D   [facility]

## 2018-03-03 NOTE — Progress Notes (Signed)
[facility] PROGRESS NOTE  Daniel AntuKeith T Stillson ZOX:[id number] DOB: 1970-07-25 DOA: 03/01/2018 PCP: Patient, No Pcp Per  Assessment/Plan: 1. Acute hypoxic respiratory failure, secondary to PE, also concern for potential asthma exacerbation. CTA confirmed bilateral PE in segmental to subsegmental PE. V/q intermed probability. TTE w/ wnl RV systolic function. Doing well on room air. Plan for ambulatory oxygen test.  Continue scheduled nebs for some possible asthma exacerbation,  (no current wheezing. procal unremarkable, flu panel and s. pna negative less likely infection).  Incentive spirometry for supportive care, wean oxygen as able, wnl sputum, legionella cx. Monitoring blood cultures  2. Sinus Tachycardia. Could be contributed to PE as well as alcohol withdrawal  3. Fever. 101.4 yesterday evening. Blood culture negative, procal unremarkable. Less likely infection could be inflammatory response to PE. Blood cultures if repeat PE  4. Hypomagnesemia. Currently 1.1. Likely related to Etoh abuse. Replete with IV and monitor  5. HTN. History but not been on lisinopril due to finances  6. Alcohol abuse. Admits to drinking 2 40 oz beers 5 days a week. Only 1 beer day before admission.  Having some tachycardia can be related to PE or potential withdrawal symptoms.  Monitor on CIWA protocol PRN ativan, scheduled librium, folic acid, thiamine, multivitamin  7. Elevated AST and ALT, likely alcoholic hepatitis, improving.  Closely monitor LFTs.  8. Thrombocytopenia.  Neck related to alcohol abuse.  No known history of cirrhosis.  INR within normal limits, no ascites or stigmata of potential end stage liver disease on exam.  No active signs or symptoms of bleeding, tolerating heparin  9. Chronic normocytic anemia.  Hemoglobin stable.  Continue monitor daily CBC.  Code Status: Full Code Family Communication: no family at bedside  Disposition Plan:  CIWA protocol, replete magnesium, sinus tachycardia,  will monitor on heparin and switch to potential NOAC(c/m consult given financial constraints)  Consultants:  PUlmonary  Procedures:  none  Antibiotics:  none   HPI/Subjective:  Daniel [name] is a 48 y.o. year old male with medical history significant for alcohol abuse, HTN, tobacco abuse, asthma who presented on 03/01/2018 with 2 days of muscle aches, productive cough and worsening shortness of breath at rest ( with no chest pain) and was found to have acute hypoxic respiratory failure presumed secondary to PE  Continues to have improvement in breathing this am. No cp, no fevers or chills. Noticed some slight redness in urine. Increase mucus production and nasal congestion   Objective: Vitals:   03/03/18 0620 03/03/18 0800  BP:    Pulse: 96   Resp:  18  Temp:    SpO2:      Intake/Output Summary (Last 24 hours) at 03/03/2018 1449 Last data filed at 03/03/2018 1216 Gross per 24 hour  Intake 880.45 ml  Output 650 ml  Net 230.45 ml   Filed Weights   03/01/18 1745 03/01/18 2202  Weight: 99.8 kg 102.2 kg    Exam:   General: male, no distress  Cardiovascular: tachycardic, no murmurs, rubs or gallops  Respiratory: normal respiratory effort onroom air, no crackles or wheezes heard  Abdomen: soft abdomen, normal bowel sounds, non-tender.  Musculoskeletal: normal range of motion   Skin no rashes or lesions  Neurologic alert and oriented x 4, no appreciable focal deficits  Psych: slow speech, flat affect, normal mood  Data Reviewed: Basic Metabolic Panel: Recent Labs  Lab 03/01/18 1358 03/03/18 0347  NA 136 137  K 4.0 3.1*  CL 94* 91*  CO2 22 26  GLUCOSE 98 94  BUN 12 10  CREATININE 1.05 0.90  CALCIUM 8.7* 8.3*  MG  --  1.1*   Liver Function Tests: Recent Labs  Lab 03/01/18 1358 03/03/18 0347  AST 105* 63*  ALT 50* 36  ALKPHOS 84 64  BILITOT 1.8* 1.0  PROT 7.6 7.1  ALBUMIN 4.1 3.2*   No results for input(s): LIPASE, AMYLASE in the last 168  hours. No results for input(s): AMMONIA in the last 168 hours. CBC: Recent Labs  Lab 03/01/18 1358 03/02/18 0014 03/03/18 0347  WBC 6.9 6.4 6.4  NEUTROABS 5.0  --   --   HGB 11.8* 11.5* 11.7*  HCT 34.3* 34.0* 35.1*  MCV 91.7 91.4 91.9  PLT 104* 102* 97*   Cardiac Enzymes: Recent Labs  Lab 03/01/18 1645 03/01/18 1947  TROPONINI <0.03 <0.03   BNP (last 3 results) Recent Labs    03/01/18 1947  BNP 63.4    ProBNP (last 3 results) No results for input(s): PROBNP in the last 8760 hours.  CBG: No results for input(s): GLUCAP in the last 168 hours.  Recent Results (from the past 240 hour(s))  Culture, blood (routine x 2)     Status: None (Preliminary result)   Collection Time: 03/01/18  4:19 PM  Result Value Ref Range Status   Specimen Description BLOOD LEFT ANTECUBITAL  Final   Special Requests   Final    BOTTLES DRAWN AEROBIC AND ANAEROBIC Blood Culture results may not be optimal due to an inadequate volume of blood received in culture bottles   Culture   Final    NO GROWTH < 24 HOURS Performed at [facility], [address] [place], [place] [address]    Report Status PENDING  Incomplete  Culture, blood (routine x 2)     Status: None (Preliminary result)   Collection Time: 03/01/18  4:51 PM  Result Value Ref Range Status   Specimen Description BLOOD LEFT HAND  Final   Special Requests   Final    BOTTLES DRAWN AEROBIC AND ANAEROBIC Blood Culture adequate volume   Culture   Final    NO GROWTH < 24 HOURS Performed at [facility], [address], [place], [place] [address]    Report Status PENDING  Incomplete  Culture, sputum-assessment     Status: None   Collection Time: 03/01/18 10:47 PM  Result Value Ref Range Status   Specimen Description EXPECTORATED SPUTUM  Final   Special Requests NONE  Final   Sputum evaluation   Final    THIS SPECIMEN IS ACCEPTABLE FOR SPUTUM CULTURE Performed at [facility], [address], [place], [place]  [address]    Report Status 03/01/2018 FINAL  Final  Culture, respiratory     Status: None (Preliminary result)   Collection Time: 03/01/18 10:47 PM  Result Value Ref Range Status   Specimen Description EXPECTORATED SPUTUM  Final   Special Requests NONE Reflexed from [id number]  Final   Gram Stain   Final    ABUNDANT WBC PRESENT, PREDOMINANTLY PMN FEW GRAM POSITIVE COCCI RARE GRAM NEGATIVE RODS RARE GRAM POSITIVE RODS Performed at [facility], [address], [place], [place] [address]    Culture PENDING  Incomplete   Report Status PENDING  Incomplete     Studies: Ct Angio Chest Pe W/cm &/or Wo Cm  Result Date: 03/01/2018 CLINICAL DATA:  Acute onset body aches, fever and chills yesterday. Tachycardia. History of asthma. EXAM: CT ANGIOGRAPHY  CHEST WITH CONTRAST TECHNIQUE: Multidetector CT imaging of the chest was performed using the standard protocol during bolus administration of intravenous contrast. Multiplanar CT image reconstructions and MIPs were obtained to evaluate the vascular anatomy. CONTRAST:  67mL ISOVUE-370 IOPAMIDOL (ISOVUE-370) INJECTION 76% COMPARISON:  Chest radiograph March 01, 2018. CT chest report dated August 15, 2017 though images are not available for direct comparison. FINDINGS: CARDIOVASCULAR: Suboptimal contrast opacification of the pulmonary artery's (main pulmonary artery is 167 Hounsfield units, target is 250 Hounsfield units. Main pulmonary artery is mildly enlarged at 3.1 cm. Lingular lobar LEFT upper lobe segmental to subsegmental central pulmonary arterial filling defects. RIGHT lower lobe segmental to subsegmental nonocclusive pulmonary emboli. RIGHT middle lobe to segmental emboli. Heart size is normal. RIGHT heart strain (RV/LV equals 1.1). No pericardial effusion. Thoracic aorta is normal course and caliber, unremarkable. MEDIASTINUM/NODES: No lymphadenopathy by CT size criteria. LUNGS/PLEURA: Tracheobronchial tree is patent, no pneumothorax. Mild bronchial  wall thickening. Subcentimeter nodular densities RIGHT lower lobe medial segment. Patchy ground-glass opacity and small consolidation LEFT lower lobe sparing the periphery. Minimal LEFT upper lobe tree-in-bud infiltrates. No pleural effusion. UPPER ABDOMEN: Non-acute. Hypodense liver most compatible with steatosis. MUSCULOSKELETAL: Non-acute. Mild thoracic spondylosis. Gynecomastia. Moderate RIGHT acromioclavicular osteoarthrosis. Review of the MIP images confirms the above findings. IMPRESSION: 1. Acute bilateral occlusive and nonocclusive pulmonary emboli predominantly in segmental to subsegmental branches. CT evidence of right heart strain (RV/LV Ratio = 1.1) consistent with at least submassive (intermediate risk) PE. The presence of right heart strain has been associated with an increased risk of morbidity and mortality. Please activate Code PE by paging [phone]. 2. Bronchial wall thickening and scattered consolidations concerning for bronchopneumonia or aspiration, less likely septic emboli. No pulmonary infarcts. 3. Critical Value/emergent results were called by telephone at the time of interpretation on 03/01/2018 at 4:59 pm to PA. Aviva Kluver , who verbally acknowledged these results. Electronically Signed   By: Awilda Metro M.D.   On: 03/01/2018 17:02   Nm Pulmonary Perf And Vent  Result Date: 03/02/2018 CLINICAL DATA:  Pulmonary embolism by prior CTA chest EXAM: NUCLEAR MEDICINE VENTILATION - PERFUSION LUNG SCAN TECHNIQUE: Ventilation images were obtained in multiple projections using inhaled aerosol Tc-54m DTPA. Perfusion images were obtained in multiple projections after intravenous injection of Tc-73m MAA. RADIOPHARMACEUTICALS:  32.3 mCi of Tc-30m DTPA aerosol inhalation and 4.33 mCi Tc98m MAA IV COMPARISON:  CTA chest and chest radiograph exams of 03/01/2018 FINDINGS: Ventilation: Swallowed esophageal and gastric aerosol. Subsegmental ventilation defects lateral RIGHT RIGHT upper lobe near  minor fissure and in superior segment of RIGHT lower lobe. Perfusion: Perfusion defects identified RIGHT upper lobe laterally, superior segment RIGHT lower lobe, LEFT lower lobe, and RIGHT apex. The LEFT apical and LEFT lower lobe defects show no definite accompanying ventilation defects, while the RIGHT upper lobe and superior segment RIGHT lower lobe defects are matching. Findings represent an intermediate probability for pulmonary embolism. However, the preceding CTA chest exam of 1 day prior, while of suboptimal enhancement quality, demonstrated presence of BILATERAL pulmonary emboli. IMPRESSION: More perfusion and ventilation defects in the lungs as above representing an intermediate probability for pulmonary embolism. However, the preceding CTA chest exam of 1 day prior was diagnostic for pulmonary emboli. Electronically Signed   By: Ulyses Southward M.D.   On: 03/02/2018 19:18    Scheduled Meds: . chlordiazePOXIDE  5 mg Oral TID  . folic acid  1 mg Oral Daily  . Influenza vac split quadrivalent PF  0.5 mL Intramuscular Tomorrow-1000  .  ipratropium  0.5 mg Nebulization BID  . multivitamin with minerals  1 tablet Oral Daily  . nicotine  21 mg Transdermal Daily  . pneumococcal 23 valent vaccine  0.5 mL Intramuscular Tomorrow-1000  . potassium chloride  40 mEq Oral BID  . thiamine  100 mg Oral Daily   Or  . thiamine  100 mg Intravenous Daily   Continuous Infusions: . heparin 1,550 Units/hr (03/03/18 [id number])    Principal Problem:   Acute respiratory failure with hypoxia (HCC) Active Problems:   Alcohol abuse   Tobacco abuse   PE (pulmonary thromboembolism) (HCC)   Thrombocytopenia (HCC)   Exacerbation of asthma   HTN (hypertension)   Alcoholic hepatitis without ascites   Chronic anemia      Elder CyphersShayla D Donnalyn Juran  [facility]

## 2018-03-04 DIAGNOSIS — I10 Essential (primary) hypertension: Secondary | ICD-10-CM

## 2018-03-04 LAB — CBC
HCT: 33.8 % — ABNORMAL LOW (ref 39.0–52.0)
Hemoglobin: 11.8 g/dL — ABNORMAL LOW (ref 13.0–17.0)
MCH: 31.9 pg (ref 26.0–34.0)
MCHC: 34.9 g/dL (ref 30.0–36.0)
MCV: 91.4 fL (ref 80.0–100.0)
NRBC: 0 % (ref 0.0–0.2)
Platelets: 115 10*3/uL — ABNORMAL LOW (ref 150–400)
RBC: 3.7 MIL/uL — ABNORMAL LOW (ref 4.22–5.81)
RDW: 16.3 % — ABNORMAL HIGH (ref 11.5–15.5)
WBC: 6.9 10*3/uL (ref 4.0–10.5)

## 2018-03-04 LAB — MAGNESIUM: Magnesium: 1.6 mg/dL — ABNORMAL LOW (ref 1.7–2.4)

## 2018-03-04 LAB — COMPREHENSIVE METABOLIC PANEL
ALK PHOS: 60 U/L (ref 38–126)
ALT: 32 U/L (ref 0–44)
AST: 57 U/L — ABNORMAL HIGH (ref 15–41)
Albumin: 3.2 g/dL — ABNORMAL LOW (ref 3.5–5.0)
Anion gap: 9 (ref 5–15)
BUN: 9 mg/dL (ref 6–20)
CHLORIDE: 97 mmol/L — AB (ref 98–111)
CO2: 27 mmol/L (ref 22–32)
Calcium: 8.3 mg/dL — ABNORMAL LOW (ref 8.9–10.3)
Creatinine, Ser: 0.85 mg/dL (ref 0.61–1.24)
GFR calc Af Amer: >60 mL/min (ref >60)
GFR calc non Af Amer: >60 mL/min (ref >60)
Glucose, Bld: 113 mg/dL — ABNORMAL HIGH (ref 70–99)
Potassium: 3.2 mmol/L — ABNORMAL LOW (ref 3.5–5.1)
Sodium: 133 mmol/L — ABNORMAL LOW (ref 135–145)
Total Bilirubin: 0.8 mg/dL (ref 0.3–1.2)
Total Protein: 6.9 g/dL (ref 6.5–8.1)

## 2018-03-04 LAB — CULTURE, RESPIRATORY W GRAM STAIN: Culture: NORMAL

## 2018-03-04 LAB — HEPARIN LEVEL (UNFRACTIONATED): Heparin Unfractionated: <0.1 IU/mL — ABNORMAL LOW (ref 0.30–0.70)

## 2018-03-04 MED ORDER — APIXABAN 5 MG PO TABS
10.0000 mg | ORAL_TABLET | Freq: Two times a day (BID) | ORAL | Status: DC
  Administered 2018-03-04: 10 mg via ORAL
  Filled 2018-03-04: qty 2

## 2018-03-04 MED ORDER — ALBUTEROL SULFATE HFA 108 (90 BASE) MCG/ACT IN AERS: 1.0000 | INHALATION_SPRAY | Freq: Four times a day (QID) | RESPIRATORY_TRACT | 0 refills | Status: DC | PRN

## 2018-03-04 MED ORDER — MAGNESIUM OXIDE 400 MG PO CAPS: 400.0000 mg | ORAL_CAPSULE | Freq: Two times a day (BID) | ORAL | 0 refills | Status: AC

## 2018-03-04 MED ORDER — ELIQUIS 5 MG VTE STARTER PACK: ORAL_TABLET | ORAL | 0 refills | Status: DC

## 2018-03-04 MED ORDER — ADULT MULTIVITAMIN W/MINERALS CH: 1.0000 | ORAL_TABLET | Freq: Every day | ORAL | 0 refills | Status: DC

## 2018-03-04 MED ORDER — THIAMINE HCL 100 MG PO TABS: 100.0000 mg | ORAL_TABLET | Freq: Every day | ORAL | 0 refills | Status: DC

## 2018-03-04 MED ORDER — FOLIC ACID 1 MG PO TABS: 1.0000 mg | ORAL_TABLET | Freq: Every day | ORAL | 0 refills | Status: DC

## 2018-03-04 MED ORDER — DM-GUAIFENESIN ER 30-600 MG PO TB12: 1.0000 | ORAL_TABLET | Freq: Two times a day (BID) | ORAL | 0 refills | Status: DC | PRN

## 2018-03-04 MED ORDER — IPRATROPIUM BROMIDE 0.02 % IN SOLN: 0.5000 mg | Freq: Four times a day (QID) | RESPIRATORY_TRACT | Status: DC | PRN

## 2018-03-04 MED ORDER — LISINOPRIL 10 MG PO TABS: 10.0000 mg | ORAL_TABLET | Freq: Every day | ORAL | 11 refills | Status: DC

## 2018-03-04 MED ORDER — HEPARIN BOLUS VIA INFUSION
3000.0000 [IU] | Freq: Once | INTRAVENOUS | Status: AC
  Administered 2018-03-04: 3000 [IU] via INTRAVENOUS
  Filled 2018-03-04: qty 3000

## 2018-03-04 MED ORDER — BENZONATATE 100 MG PO CAPS: 100.0000 mg | ORAL_CAPSULE | Freq: Two times a day (BID) | ORAL | 0 refills | Status: DC

## 2018-03-04 MED FILL — LISINOPRIL 10 MG TABS: 10 | 30 days supply | Qty: 30 | Fill #0 | Status: TO

## 2018-03-04 MED FILL — VENTOLIN HFA 90 MCG INHALER: 108 (90 BAS | 30 days supply | Qty: 18 | Fill #0

## 2018-03-04 MED FILL — THIAMINE HCL 100 MG TABS: 100 | 30 days supply | Qty: 30 | Fill #0

## 2018-03-04 MED FILL — ELIQUIS STARTER PACK 5 MG T: 5 | 30 days supply | Qty: 74 | Fill #0

## 2018-03-04 MED FILL — BENZONATATE 100 MG CAP: 100 | 10 days supply | Qty: 20 | Fill #0

## 2018-03-04 MED FILL — FOLIC ACID 1 MG TABS: 1 | 30 days supply | Qty: 30 | Fill #0

## 2018-03-04 MED FILL — MAGNESIUM OXIDE 400 MG TABS: 400 | 10 days supply | Qty: 20 | Fill #0

## 2018-03-04 NOTE — Progress Notes (Signed)
Patient received discharge information and acknowledged understanding of it. Patient received printed prescriptions and medications from pharmacy. Patient IV was removed.

## 2018-03-04 NOTE — Progress Notes (Signed)
ANTICOAGULATION CONSULT NOTE - Follow Up Consult  Pharmacy Consult for heparin Indication: pulmonary embolus  Labs: Recent Labs    03/01/18 1358 03/01/18 1619 03/01/18 1645 03/01/18 1947  03/02/18 0014 03/02/18 0739 03/03/18 0347 03/04/18 0517  HGB 11.8*  --   --   --   --  11.5*  --  11.7*  --   HCT 34.3*  --   --   --   --  34.0*  --  35.1*  --   PLT 104*  --   --   --   --  102*  --  97*  --   LABPROT  --  13.2  --   --   --   --   --   --   --   INR  --  1.01  --   --   --   --   --   --   --   HEPARINUNFRC  --   --   --   --    < > 0.63 0.59 0.51 <0.10*  CREATININE 1.05  --   --   --   --   --   --  0.90 0.85  TROPONINI  --   --  <0.03 <0.03  --   --   --   --   --    < > = values in this interval not displayed.    Assessment/Plan:  Heparin level undetectable this am but RN reports that pt had pulled IV and was out x4845min around time of lab draw.  Will give heparin bolus of 3000 units and continue current rate and recheck heparin level in 6hr.  Vernard GamblesVeronda Sherril Heyward, PharmD, BCPS  03/04/2018,6:47 AM

## 2018-03-04 NOTE — Evaluation (Signed)
Physical Therapy Evaluation Patient Details Name: Daniel [name] MRN: [id number] DOB: [birth year] Today's Date: 03/04/2018   History of Present Illness  Acute hypoxic respiratory failure, secondary to PE, also concern for potential asthma exacerbation. CTA confirmed bilateral PE.  Pt with tachycardia - due to PE or alcohol withdrawal??      Clinical Impression  Pt did well today - walked 500 feet with no gait deficits.  pts resting O2 97% on RA and HR 108bpm.  Pts activity O2 96% on RA and HR 124bpm with no SOB.  Pt educated on slowing down and avoiding increasing HR more than needed.  Pt reports feeling anxious. He is hoping to go home today.  No PT skilled needs identified. All education complete.    Follow Up Recommendations No PT follow up    Equipment Recommendations  None recommended by PT    Recommendations for Other Services       Precautions / Restrictions Precautions Precautions: None Precaution Comments: pt denies history of falls      Mobility  Bed Mobility               General bed mobility comments: pt OOB when i arrived  Transfers Overall transfer level: Independent Equipment used: None                Ambulation/Gait Ambulation/Gait assistance: Supervision Gait Distance (Feet): 500 Feet Assistive device: None Gait Pattern/deviations: WFL(Within Functional Limits)     General Gait Details: Pts ambulation safe - i kept cuing him to slow down due to tachycardia. I educated him on modifying his activity = to avoid getting his HR too high. He VU.  Today HR 108bpm at rest and 124bpm during and after walking 500 feet.  pt never felt like heart beating fast  Stairs            Wheelchair Mobility    Modified Rankin (Stroke Patients Only)       Balance Overall balance assessment: No apparent balance deficits (not formally assessed)                                           Pertinent Vitals/Pain Pain Assessment:  No/denies pain    Home Living Family/patient expects to be discharged to:: Private residence Living Arrangements: Non-relatives/Friends Available Help at Discharge: Available PRN/intermittently           Home Equipment: None Additional Comments: pt said he lives in home with no steps and someone will be able to help as needed.  He gave no details    Prior Function Level of Independence: Independent         Comments: pt denies ever needing to use assistive devices     Hand Dominance        Extremity/Trunk Assessment   Upper Extremity Assessment Upper Extremity Assessment: Generalized weakness    Lower Extremity Assessment Lower Extremity Assessment: Generalized weakness    Cervical / Trunk Assessment Cervical / Trunk Assessment: Normal  Communication   Communication: No difficulties  Cognition Arousal/Alertness: Awake/alert Behavior During Therapy: WFL for tasks assessed/performed;Flat affect Overall Cognitive Status: Within Functional Limits for tasks assessed                                 General Comments: pt said he was nervous but gave no  reason for this - could be from alchol withdrawal or PE?      General Comments      Exercises     Assessment/Plan    PT Assessment Patent does not need any further PT services  PT Problem List Decreased knowledge of precautions;Cardiopulmonary status limiting activity       PT Treatment Interventions Gait training;Patient/family education    PT Goals (Current goals can be found in the Care Plan section)  Acute Rehab PT Goals Patient Stated Goal: im ready to go home today PT Goal Formulation: With patient Time For Goal Achievement: 03/04/18 Potential to Achieve Goals: Good    Frequency     Barriers to discharge        Co-evaluation               AM-PAC PT "6 Clicks" Mobility  Outcome Measure Help needed turning from your back to your side while in a flat bed without using  bedrails?: None Help needed moving from lying on your back to sitting on the side of a flat bed without using bedrails?: None Help needed moving to and from a bed to a chair (including a wheelchair)?: None Help needed standing up from a chair using your arms (e.g., wheelchair or bedside chair)?: None Help needed to walk in hospital room?: None Help needed climbing 3-5 steps with a railing? : None 6 Click Score: 24    End of Session Equipment Utilized During Treatment: Gait belt Activity Tolerance: Patient tolerated treatment well Patient left: in chair;with chair alarm set;with call bell/phone within reach;with nursing/sitter in room Nurse Communication: Mobility status PT Visit Diagnosis: Difficulty in walking, not elsewhere classified (R26.2)    Time: 2376-2831 PT Time Calculation (min) (ACUTE ONLY): 20 min   Charges:   PT Evaluation $PT Eval Low Complexity: 1 Low PT Treatments $Gait Training: 8-22 mins       03/04/2018   Daniel [name], PT   Daniel [name] 03/04/2018, 10:31 AM

## 2018-03-04 NOTE — Progress Notes (Signed)
Patient ambulated in hall with PT. Maintained O2 sats 96% on RA.

## 2018-03-04 NOTE — Discharge Summary (Signed)
Discharge Summary  Daniel [name] UJW:[id number] DOB: 05-Jan-1971  PCP: Patient, No Pcp Per  Admit date: 03/01/2018 Discharge date: 03/04/2018   Time spent: < 25 minutes  Admitted From: home Disposition:  home  Recommendations for Outpatient Follow-up:  1. Follow up with PCP in 1 week [facility] 2. Discharged with eliquis starter park, Mucinex as needed, Tessalon Perles as needed Zofran as needed, folic acid, thiamine, magnesium, lisinopril     Discharge Diagnoses:  Active Hospital Problems   Diagnosis Date Noted  . Acute respiratory failure with hypoxia (HCC) 03/01/2018  . Hypomagnesemia 03/03/2018  . HTN (hypertension) 03/02/2018  . Alcoholic hepatitis without ascites 03/02/2018  . Chronic anemia 03/02/2018  . Alcohol abuse 03/01/2018  . Tobacco abuse 03/01/2018  . PE (pulmonary thromboembolism) (HCC) 03/01/2018  . Thrombocytopenia (HCC) 03/01/2018  . Exacerbation of asthma     Resolved Hospital Problems  No resolved problems to display.    Discharge Condition: Stable   CODE STATUS: FULL Code  Diet recommendation:     History of present illness:  Daniel AntuKeith T Younis is a 48 y.o. year old male with medical history significant for alcohol abuse, HTN, tobacco abuse, asthma who presented on 03/01/2018 with 2 days of muscle aches, productive cough and worsening shortness of breath at rest ( with no chest pain) and was found to have acute hypoxic respiratory failure presumed secondary to PE. Remaining hospital course addressed in problem based format below:   Hospital Course:   1. Acute hypoxic respiratory failure, secondary to PE, also concern for potential asthma exacerbation.  Initially presented on 2/7 to ED with flulike symptoms of muscle aches, fevers and cough presumed to be viral illness and discharged from ED with Tamiflu, prednisone and supportive care.  Patient returned hypoxic with persistent tachycardia, CTA   confirmed bilateral PE in segmental to subsegmental PE with concern for right heart strain.  While on heparin VQ scan was obtained as patient's story did not seem to be indicative of true PE as recommended by critical care/pulmonary.  Other etiologies were ruled out with normal chest x-ray, procalcitonin unremarkable, flu panel, Legionella, strep pneumo all negative.  Blood cultures were also unremarkable.  Patient was briefly started on IV antibiotics which were discontinued given as likely infectious process.  VQ scan showed intermediate probability, critical care recommended continuing anticoagulation as this was likely true submassive PE.  TTE showed no right heart strain with normal systolic function, venous ultrasound of bilateral lower legs showed no DVT.  Patient has no family history of DVT/PE denies any recent immobilization or travel.  This will likely be an unprovoked PE.  Patient transitioned to Eliquis with no complications on discharge provided Eliquis starter pack.Maintained normal oxygen saturation on ambulatory testing.   2. Flu like illness,improving As above influenza, sputum cx, legionella, s.pna, all unremarkable.  Did well with supportive care with incentive spirometry, on discharge given as needed Mucinex and Tessalon Perles.  3. Sinus Tachycardia, greatly improved.  Likely multifactorial related to PE and some symptoms of alcohol withdrawal.  4. Fever, resolved.  Blood culture negative, procal unremarkable. Less likely infection could be inflammatory response to PE. Remained afebrile throughout hospital stay from there  5. Hypomagnesemia. Nadir of 1.1, 1.6 on discharge, prescribed oral magnesium for a few days, should recheck at PCP. Likely related to Etoh abuse. Replete with IV during stay  6. HTN. History but not been on lisinopril due to finances, Given script on discharge  for lisinopril 10 mg  7. Alcohol abuse. Admits to drinking 2 40 oz beers 5 days a week. Only 1 beer  day before admission.  Monitored on CIWA and required scheduled ativan on 1st day before de-escalation to PRN ativan. Required no doses last 24 hours of admission.  8. Elevated AST and ALT, likely alcoholic hepatitis, improving.  Closely monitor LFTs.  Advised to decrease alcohol use  9. Thrombocytopenia.  Neck related to alcohol abuse.  No known history of cirrhosis.  INR within normal limits, no ascites or stigmata of potential end stage liver disease on exam.  No active signs or symptoms of bleeding, tolerated heparin infusion. Started on xarelto here.  10. Chronic normocytic anemia.  Hemoglobin stable.  Continue monitor daily CBC   Consultations:  Pulmonary/critical care  Procedures/Studies: 03/03/2018:Bilateral lower extremity venous ultrasounds: Right and left negative for acute DVT.  2/9, TTE 1. The left ventricle has normal systolic function of 55-60%. The cavity size was normal. There is mildly increased left ventricular wall thickness. Left ventricular diastology could not be evaluated due to nondiagnostic images.  2. The right ventricle was not well visualized. The right ventricle has normal systolic function. The cavity was normal. There is no increase in right ventricular wall thickness.  3. Right atrial size was mildly dilated.  4. The mitral valve is normal in structure.  5. The tricuspid valve is normal in structure.  6. The aortic valve is normal in structure.  7. The pulmonic valve was normal in structure  Discharge Exam: BP (!) 134/106 (BP Location: Left Arm)   Pulse 98   Temp 98.8 F (37.1 C) (Oral)   Resp 20   Ht 5\' 7"  (1.702 m)   Wt 99.6 kg   SpO2 99%   BMI 34.39 kg/m    General: male, no distress  Cardiovascular: RRR, no murmurs, rubs or gallops  Respiratory: normal respiratory effort on room air, no crackles or wheezes heard  Abdomen: soft abdomen, normal bowel sounds, non-tender.  Musculoskeletal: normal range of motion   Skin no rashes or  lesions  Neurologic alert and oriented x 4, no appreciable focal deficits  Psych: slow speech, flat affect, normal mood   Discharge Instructions You were cared for by a hospitalist during your hospital stay. If you have any questions about your discharge medications or the care you received while you were in the hospital after you are discharged, you can call the unit and asked to speak with the hospitalist on call if the hospitalist that took care of you is not available. Once you are discharged, your primary care physician will handle any further medical issues. Please note that NO REFILLS for any discharge medications will be authorized once you are discharged, as it is imperative that you return to your primary care physician (or establish a relationship with a primary care physician if you do not have one) for your aftercare needs so that they can reassess your need for medications and monitor your lab values.  Discharge Instructions    Diet - low sodium heart healthy   Complete by:  As directed    Increase activity slowly   Complete by:  As directed      Allergies as of 03/04/2018   No Known Allergies     Medication List    TAKE these medications   albuterol 108 (90 Base) MCG/ACT inhaler Commonly known as:  PROVENTIL HFA;VENTOLIN HFA Inhale 1 puff into the lungs every 6 (six) hours as needed  for wheezing or shortness of breath.   benzonatate 100 MG capsule Commonly known as:  TESSALON Take 1 capsule (100 mg total) by mouth 2 (two) times daily.   dextromethorphan-guaiFENesin 30-600 MG 12hr tablet Commonly known as:  MUCINEX DM Take 1 tablet by mouth 2 (two) times daily as needed for cough.   ELIQUIS DVT/PE STARTER PACK 5 MG Tabs Take as directed on package: start with two-5mg  tablets twice daily for 7 days. On day 8, switch to one-5mg  tablet twice daily.   folic acid 1 MG tablet Commonly known as:  FOLVITE Take 1 tablet (1 mg total) by mouth daily. Start taking on:   March 05, 2018   multivitamin with minerals Tabs tablet Take 1 tablet by mouth daily. Start taking on:  March 05, 2018   ondansetron 4 MG tablet Commonly known as:  ZOFRAN Take 1 tablet (4 mg total) by mouth every 8 (eight) hours as needed for nausea or vomiting.   thiamine 100 MG tablet Take 1 tablet (100 mg total) by mouth daily. Start taking on:  March 05, 2018      No Known Allergies Follow-up Information    Go to  [facility] EMERGENCY DEPARTMENT.   Specialty:  Emergency Medicine Why:  As needed, If symptoms worsen Contact information: [address] [id number] [place] [id number] [phone]       Anders Simmonds, PA-C Follow up on 03/13/2018.   Specialty:  Family Medicine Why:  @ 2:30 for hospital follow up appointment. If you can not make this scheduled appointment please call to reschedule. Phamracy onsite- cost ranges from $4.00-$10.00. Please ask for patient assistance for Eliquis.  Contact information: [address] [place] [address] [phone]            The results of significant diagnostics from this hospitalization (including imaging, microbiology, ancillary and laboratory) are listed below for reference.    Significant Diagnostic Studies: Dg Chest 2 View  Result Date: 03/01/2018 CLINICAL DATA:  Cough and fever for 2 days. EXAM: CHEST - 2 VIEW COMPARISON:  08/15/2017 FINDINGS: The cardiac silhouette, mediastinal and hilar contours are normal in stable. The lungs are clear. No pleural effusion. No worrisome pulmonary lesions. The bony thorax is intact. IMPRESSION: No acute cardiopulmonary findings. Electronically Signed   By: Rudie Meyer M.D.   On: 03/01/2018 14:23   Ct Angio Chest Pe W/cm &/or Wo Cm  Result Date: 03/01/2018 CLINICAL DATA:  Acute onset body aches, fever and chills yesterday. Tachycardia. History of asthma. EXAM: CT ANGIOGRAPHY CHEST WITH CONTRAST TECHNIQUE: Multidetector CT  imaging of the chest was performed using the standard protocol during bolus administration of intravenous contrast. Multiplanar CT image reconstructions and MIPs were obtained to evaluate the vascular anatomy. CONTRAST:  66mL ISOVUE-370 IOPAMIDOL (ISOVUE-370) INJECTION 76% COMPARISON:  Chest radiograph March 01, 2018. CT chest report dated August 15, 2017 though images are not available for direct comparison. FINDINGS: CARDIOVASCULAR: Suboptimal contrast opacification of the pulmonary artery's (main pulmonary artery is 167 Hounsfield units, target is 250 Hounsfield units. Main pulmonary artery is mildly enlarged at 3.1 cm. Lingular lobar LEFT upper lobe segmental to subsegmental central pulmonary arterial filling defects. RIGHT lower lobe segmental to subsegmental nonocclusive pulmonary emboli. RIGHT middle lobe to segmental emboli. Heart size is normal. RIGHT heart strain (RV/LV equals 1.1). No pericardial effusion. Thoracic aorta is normal course and caliber, unremarkable. MEDIASTINUM/NODES: No lymphadenopathy by CT size criteria. LUNGS/PLEURA: Tracheobronchial tree is patent, no pneumothorax. Mild bronchial  wall thickening. Subcentimeter nodular densities RIGHT lower lobe medial segment. Patchy ground-glass opacity and small consolidation LEFT lower lobe sparing the periphery. Minimal LEFT upper lobe tree-in-bud infiltrates. No pleural effusion. UPPER ABDOMEN: Non-acute. Hypodense liver most compatible with steatosis. MUSCULOSKELETAL: Non-acute. Mild thoracic spondylosis. Gynecomastia. Moderate RIGHT acromioclavicular osteoarthrosis. Review of the MIP images confirms the above findings. IMPRESSION: 1. Acute bilateral occlusive and nonocclusive pulmonary emboli predominantly in segmental to subsegmental branches. CT evidence of right heart strain (RV/LV Ratio = 1.1) consistent with at least submassive (intermediate risk) PE. The presence of right heart strain has been associated with an increased risk of morbidity  and mortality. Please activate Code PE by paging [phone]. 2. Bronchial wall thickening and scattered consolidations concerning for bronchopneumonia or aspiration, less likely septic emboli. No pulmonary infarcts. 3. Critical Value/emergent results were called by telephone at the time of interpretation on 03/01/2018 at 4:59 pm to PA. Aviva Kluver , who verbally acknowledged these results. Electronically Signed   By: Awilda Metro M.D.   On: 03/01/2018 17:02   Nm Pulmonary Perf And Vent  Result Date: 03/02/2018 CLINICAL DATA:  Pulmonary embolism by prior CTA chest EXAM: NUCLEAR MEDICINE VENTILATION - PERFUSION LUNG SCAN TECHNIQUE: Ventilation images were obtained in multiple projections using inhaled aerosol Tc-28m DTPA. Perfusion images were obtained in multiple projections after intravenous injection of Tc-76m MAA. RADIOPHARMACEUTICALS:  32.3 mCi of Tc-51m DTPA aerosol inhalation and 4.33 mCi Tc26m MAA IV COMPARISON:  CTA chest and chest radiograph exams of 03/01/2018 FINDINGS: Ventilation: Swallowed esophageal and gastric aerosol. Subsegmental ventilation defects lateral RIGHT RIGHT upper lobe near minor fissure and in superior segment of RIGHT lower lobe. Perfusion: Perfusion defects identified RIGHT upper lobe laterally, superior segment RIGHT lower lobe, LEFT lower lobe, and RIGHT apex. The LEFT apical and LEFT lower lobe defects show no definite accompanying ventilation defects, while the RIGHT upper lobe and superior segment RIGHT lower lobe defects are matching. Findings represent an intermediate probability for pulmonary embolism. However, the preceding CTA chest exam of 1 day prior, while of suboptimal enhancement quality, demonstrated presence of BILATERAL pulmonary emboli. IMPRESSION: More perfusion and ventilation defects in the lungs as above representing an intermediate probability for pulmonary embolism. However, the preceding CTA chest exam of 1 day prior was diagnostic for pulmonary  emboli. Electronically Signed   By: Ulyses Southward M.D.   On: 03/02/2018 19:18   Vas [place] Lower Extremity Venous (dvt)  Result Date: 03/03/2018  Lower Venous Study Indications: Pulmonary embolism.  Performing Technologist: Blanch Media RVS  Examination Guidelines: A complete evaluation includes B-mode imaging, spectral Doppler, color Doppler, and power Doppler as needed of all accessible portions of each vessel. Bilateral testing is considered an integral part of a complete examination. Limited examinations for reoccurring indications may be performed as noted.  Right Venous Findings: +---------+---------------+---------+-----------+----------+-------+          CompressibilityPhasicitySpontaneityPropertiesSummary +---------+---------------+---------+-----------+----------+-------+ CFV      Full           Yes      Yes                          +---------+---------------+---------+-----------+----------+-------+ SFJ      Full                                                 +---------+---------------+---------+-----------+----------+-------+  FV Prox  Full                                                 +---------+---------------+---------+-----------+----------+-------+ FV Mid   Full                                                 +---------+---------------+---------+-----------+----------+-------+ FV DistalFull                                                 +---------+---------------+---------+-----------+----------+-------+ PFV      Full                                                 +---------+---------------+---------+-----------+----------+-------+ POP      Full           Yes      Yes                          +---------+---------------+---------+-----------+----------+-------+ PTV      Full                                                 +---------+---------------+---------+-----------+----------+-------+ PERO     Full                                                  +---------+---------------+---------+-----------+----------+-------+  Left Venous Findings: +---------+---------------+---------+-----------+----------+-------+          CompressibilityPhasicitySpontaneityPropertiesSummary +---------+---------------+---------+-----------+----------+-------+ CFV      Full           Yes      Yes                          +---------+---------------+---------+-----------+----------+-------+ SFJ      Full                                                 +---------+---------------+---------+-----------+----------+-------+ FV Prox  Full                                                 +---------+---------------+---------+-----------+----------+-------+ FV Mid   Full                                                 +---------+---------------+---------+-----------+----------+-------+  FV DistalFull                                                 +---------+---------------+---------+-----------+----------+-------+ PFV      Full                                                 +---------+---------------+---------+-----------+----------+-------+ POP      Full           Yes      Yes                          +---------+---------------+---------+-----------+----------+-------+ PTV      Full                                                 +---------+---------------+---------+-----------+----------+-------+ PERO     Full                                                 +---------+---------------+---------+-----------+----------+-------+    Summary: Right: There is no evidence of deep vein thrombosis in the lower extremity. No cystic structure found in the popliteal fossa. Left: There is no evidence of deep vein thrombosis in the lower extremity. No cystic structure found in the popliteal fossa.  *See table(s) above for measurements and observations.    Preliminary     Microbiology: Recent Results (from the past  240 hour(s))  Culture, blood (routine x 2)     Status: None (Preliminary result)   Collection Time: 03/01/18  4:19 PM  Result Value Ref Range Status   Specimen Description BLOOD LEFT ANTECUBITAL  Final   Special Requests   Final    BOTTLES DRAWN AEROBIC AND ANAEROBIC Blood Culture results may not be optimal due to an inadequate volume of blood received in culture bottles   Culture   Final    NO GROWTH 2 DAYS Performed at [facility], [address], [place], [place] [address]    Report Status PENDING  Incomplete  Culture, blood (routine x 2)     Status: None (Preliminary result)   Collection Time: 03/01/18  4:51 PM  Result Value Ref Range Status   Specimen Description BLOOD LEFT HAND  Final   Special Requests   Final    BOTTLES DRAWN AEROBIC AND ANAEROBIC Blood Culture adequate volume   Culture   Final    NO GROWTH 2 DAYS Performed at [facility], [address], [place], [place] [address]    Report Status PENDING  Incomplete  Culture, sputum-assessment     Status: None   Collection Time: 03/01/18 10:47 PM  Result Value Ref Range Status   Specimen Description EXPECTORATED SPUTUM  Final   Special Requests NONE  Final   Sputum evaluation   Final    THIS SPECIMEN IS ACCEPTABLE FOR SPUTUM CULTURE Performed at [facility], [address] [place], [place] [address]  Report Status 03/01/2018 FINAL  Final  Culture, respiratory     Status: None (Preliminary result)   Collection Time: 03/01/18 10:47 PM  Result Value Ref Range Status   Specimen Description EXPECTORATED SPUTUM  Final   Special Requests NONE Reflexed from [id number]  Final   Gram Stain   Final    ABUNDANT WBC PRESENT, PREDOMINANTLY PMN FEW GRAM POSITIVE COCCI RARE GRAM NEGATIVE RODS RARE GRAM POSITIVE RODS    Culture   Final    CULTURE REINCUBATED FOR BETTER GROWTH Performed at [facility], [address], [place], [place] [address]    Report Status PENDING  Incomplete     Labs: Basic  Metabolic Panel: Recent Labs  Lab 03/01/18 1358 03/03/18 0347 03/04/18 0517  NA 136 137 133*  K 4.0 3.1* 3.2*  CL 94* 91* 97*  CO2 22 26 27   GLUCOSE 98 94 113*  BUN 12 10 9   CREATININE 1.05 0.90 0.85  CALCIUM 8.7* 8.3* 8.3*  MG  --  1.1* 1.6*   Liver Function Tests: Recent Labs  Lab 03/01/18 1358 03/03/18 0347 03/04/18 0517  AST 105* 63* 57*  ALT 50* 36 32  ALKPHOS 84 64 60  BILITOT 1.8* 1.0 0.8  PROT 7.6 7.1 6.9  ALBUMIN 4.1 3.2* 3.2*   No results for input(s): LIPASE, AMYLASE in the last 168 hours. No results for input(s): AMMONIA in the last 168 hours. CBC: Recent Labs  Lab 03/01/18 1358 03/02/18 0014 03/03/18 0347 03/04/18 0517  WBC 6.9 6.4 6.4 6.9  NEUTROABS 5.0  --   --   --   HGB 11.8* 11.5* 11.7* 11.8*  HCT 34.3* 34.0* 35.1* 33.8*  MCV 91.7 91.4 91.9 91.4  PLT 104* 102* 97* 115*   Cardiac Enzymes: Recent Labs  Lab 03/01/18 1645 03/01/18 1947  TROPONINI <0.03 <0.03   BNP: BNP (last 3 results) Recent Labs    03/01/18 1947  BNP 63.4    ProBNP (last 3 results) No results for input(s): PROBNP in the last 8760 hours.  CBG: No results for input(s): GLUCAP in the last 168 hours.     Signed:  Laverna Peace, MD [facility] 03/04/2018, 1:10 PM

## 2018-03-04 NOTE — Care Management Note (Addendum)
Case Management Note  Patient Details  Name: Daniel [name] MRN: [id number] Date of Birth: 04-08-1970  Subjective/Objective: Pt presented for Acute Respiratory Failure. PTA from home with step-sister and her family. Pt is currently not working and is without insurance. Pt has no PCP-Referral received for medication assistance and PCP assist.                     Action/Plan: Patient will need Rx's sent to [facility] Pharmacy. CM to see if is eligible for MATCH. CM appointment at the [facility] and [facility]. Patient will be able to follow up at the same location for Pharmacy assistance in the future. Patient will receive assistance with Eliquis @ the Clinic via the patient assistance application of the company. No further needs from CM at this time.   Expected Discharge Date:                  Expected Discharge Plan:  Home/Self Care  In-House Referral:  Financial Counselor  Discharge planning Services  CM Consult, Medication Assistance, [facility], Follow-up appt scheduled, MATCH-completed  Post Acute Care Choice:  NA Choice offered to:  NA  DME Arranged:  N/A DME Agency:  NA  HH Arranged:  NA HH Agency:  NA  Status of Service:  Completed, signed off  If discussed at Long Length of Stay Meetings, dates discussed:    Additional Comments:  Gala Lewandowsky, RN 03/04/2018, 10:02 AM

## 2018-03-06 LAB — CULTURE, BLOOD (ROUTINE X 2)
Culture: NO GROWTH
Culture: NO GROWTH
Special Requests: ADEQUATE

## 2018-03-12 NOTE — Progress Notes (Deleted)
Patient ID: Daniel [name], male   DOB: 1970-03-15, 48 y.o.   MRN: [id number]  After hospitalization 2/07-2/10/2018.  From discharge summary: History of present illness:  Daniel [name] a 48 y.o.year old malewith medical history significant for alcohol abuse, HTN, tobacco abuse, asthma who presented on 2/7/2020with 2 days of muscle aches, productive cough and worsening shortness of breath at rest ( with no chest pain) and was found to have acute hypoxic respiratory failure presumed secondary to PE. Remaining hospital course addressed in problem based format below:   Hospital Course:   1. Acute hypoxic respiratory failure, secondary to PE, also concern for potential asthma exacerbation.  Initially presented on 2/7 to ED with flulike symptoms of muscle aches, fevers and cough presumed to be viral illness and discharged from ED with Tamiflu, prednisone and supportive care.  Patient returned hypoxic with persistent tachycardia, CTA  confirmed bilateral PE in segmental to subsegmental PE with concern for right heart strain.  While on heparin VQ scan was obtained as patient's story did not seem to be indicative of true PE as recommended by critical care/pulmonary.  Other etiologies were ruled out with normal chest x-ray, procalcitonin unremarkable, flu panel, Legionella, strep pneumo all negative.  Blood cultures were also unremarkable.  Patient was briefly started on IV antibiotics which were discontinued given as likely infectious process.  VQ scan showed intermediate probability, critical care recommended continuing anticoagulation as this was likely true submassive PE.  TTE showed no right heart strain with normal systolic function, venous ultrasound of bilateral lower legs showed no DVT.  Patient has no family history of DVT/PE denies any recent immobilization or travel.  This will likely be an unprovoked PE.  Patient transitioned to Eliquis with no complications on discharge provided Eliquis  starter pack.Maintained normal oxygen saturation on ambulatory testing.   2. Flu like illness,improving As above influenza, sputum cx, legionella, s.pna, all unremarkable.  Did well with supportive care with incentive spirometry, on discharge given as needed Mucinex and Tessalon Perles.  3. Sinus Tachycardia, greatly improved.  Likely multifactorial related to PE and some symptoms of alcohol withdrawal.  4. Fever, resolved.  Blood culture negative, procal unremarkable. Less likely infection could be inflammatory response to PE. Remained afebrile throughout hospital stay from there  5. Hypomagnesemia. Nadir of 1.1, 1.6 on discharge, prescribed oral magnesium for a few days, should recheck at PCP. Likely related to Etoh abuse. Replete with IV during stay  6. HTN. History but not been on lisinopril due to finances, Given script on discharge for lisinopril 10 mg  7. Alcohol abuse. Admits to drinking 2 40 oz beers 5 days a week. Only 1 beer day before admission. Monitored on CIWA and required scheduled ativan on 1st day before de-escalation to PRN ativan. Required no doses last 24 hours of admission.  8. Elevated AST and ALT, likely alcoholic hepatitis, improving. Closely monitor LFTs.  Advised to decrease alcohol use  9. Thrombocytopenia. Neck related to alcohol abuse. No known history of cirrhosis. INR within normal limits, no ascites or stigmata of potential end stage liver disease on exam. No active signs or symptoms of bleeding, tolerated heparin infusion. Started on xarelto here.  10. Chronic normocytic anemia. Hemoglobin stable. Continue monitor daily CBC   Consultations:  Pulmonary/critical care  Procedures/Studies: 03/03/2018:Bilateral lower extremity venous ultrasounds: Right and left negative for acute DVT.  2/9, TTE 1. The left ventricle has normal systolic function of 55-60%. The cavity size was normal. There is mildly increased left  ventricular wall  thickness. Left ventricular diastology could not be evaluated due to nondiagnostic images. 2. The right ventricle was not well visualized. The right ventricle has normal systolic function. The cavity was normal. There is no increase in right ventricular wall thickness. 3. Right atrial size was mildly dilated. 4. The mitral valve is normal in structure. 5. The tricuspid valve is normal in structure. 6. The aortic valve is normal in structure. 7. The pulmonic valve was normal in structure

## 2018-03-13 ENCOUNTER — Inpatient Hospital Stay: Payer: Self-pay

## 2018-03-15 NOTE — Progress Notes (Signed)
Transitions of Care Follow Up Call Note  Daniel [name] is an 48 y.o. male who presented to [facility] on 03/01/2018.  The patient had the following prescriptions filled at [facility]: Lisinopril, eliquis, ventolin, benzonatate, thiamine, folic acid, magnesium oxide    Pharmacist comments: patient unable to be reached by phone, transferred prescription to preferred pharmacy on profile.   []  Patient's prescriptions filled at the [facility] were transferred to the following pharmacy:  [x]  Patient unable to be reached after calling three times and prescriptions filled at the [facility] were transferred to preferred pharmacy found within their chart.   Varney Baas Tessin 03/15/2018, 5:11 PM [facility] Hours: Monday - Friday 8:30am to 5:00 PM  Phone - [phone]

## 2018-04-03 ENCOUNTER — Other Ambulatory Visit: Payer: Self-pay

## 2018-04-03 ENCOUNTER — Ambulatory Visit: Payer: Self-pay | Attending: Family Medicine | Admitting: Family Medicine

## 2018-04-03 ENCOUNTER — Encounter: Payer: Self-pay | Admitting: Family Medicine

## 2018-04-03 VITALS — BP 131/86 | HR 91 | Temp 98.1°F | Ht 68.0 in | Wt 231.2 lb

## 2018-04-03 DIAGNOSIS — I2699 Other pulmonary embolism without acute cor pulmonale: Secondary | ICD-10-CM

## 2018-04-03 DIAGNOSIS — M25552 Pain in left hip: Secondary | ICD-10-CM

## 2018-04-03 DIAGNOSIS — F1010 Alcohol abuse, uncomplicated: Secondary | ICD-10-CM

## 2018-04-03 DIAGNOSIS — E876 Hypokalemia: Secondary | ICD-10-CM

## 2018-04-03 DIAGNOSIS — I10 Essential (primary) hypertension: Secondary | ICD-10-CM

## 2018-04-03 MED ORDER — LISINOPRIL 10 MG PO TABS: 10.0000 mg | ORAL_TABLET | Freq: Every day | ORAL | 6 refills | Status: DC

## 2018-04-03 MED ORDER — APIXABAN 5 MG PO TABS: 5.0000 mg | ORAL_TABLET | Freq: Two times a day (BID) | ORAL | 1 refills | Status: DC

## 2018-04-03 MED ORDER — FOLIC ACID 1 MG PO TABS: 1.0000 mg | ORAL_TABLET | Freq: Every day | ORAL | 3 refills | Status: DC

## 2018-04-03 MED ORDER — THIAMINE HCL 100 MG PO TABS: 100.0000 mg | ORAL_TABLET | Freq: Every day | ORAL | 3 refills | Status: DC

## 2018-04-03 MED FILL — LISINOPRIL 10 MG TABS: 10 | 30 days supply | Qty: 30 | Fill #0

## 2018-04-03 MED FILL — !ELIQUIS 5MG TABLET: 5 | 30 days supply | Qty: 60 | Fill #0

## 2018-04-03 MED FILL — FOLIC ACID 1 MG TABS: 1 | 30 days supply | Qty: 30 | Fill #0

## 2018-04-03 NOTE — Progress Notes (Signed)
Subjective:  Patient ID: Daniel [name], male    DOB: [birth year]  Age: 48 y.o. MRN: [id number]  CC: Hospitalization Follow-up   HPI Daniel [name] is a 48 year old male with a history of alcohol abuse, hypertension recently diagnosed with pulmonary embolism during hospitalization from 03/01/2018 through 03/04/2018. Was managed for acute respiratory failure presumed to be secondary to PE which was confirmed by chest CTA findings.  Echocardiogram was negative for right heart strain and revealed normal EF of 55 to 60%. Maintained on CIWA protocol due to alcohol abuse and he was commenced on thiamine and folic acid at discharge.  He presents today denying shortness of breath or chest pains and has been compliant with his medications.  He quit alcohol consumption at the time of hospitalization. He does have intermittent left hip pain which is worse with walking but absent at this time. He has no other complaints today and denies bruising or bleeding on Xarelto.  Past Medical History:  Diagnosis Date  . Asthma   . Asthma   . Hypertension     Past Surgical History:  Procedure Laterality Date  . Dental procedure      Family History  Problem Relation Age of Onset  . Heart attack Mother     No Known Allergies  Outpatient Medications Prior to Visit  Medication Sig Dispense Refill  . albuterol (PROVENTIL HFA;VENTOLIN HFA) 108 (90 Base) MCG/ACT inhaler Inhale 1 puff into the lungs every 6 (six) hours as needed for wheezing or shortness of breath. 1 Inhaler 0  . Multiple Vitamin (MULTIVITAMIN WITH MINERALS) TABS tablet Take 1 tablet by mouth daily. 30 tablet 0  . ondansetron (ZOFRAN) 4 MG tablet Take 1 tablet (4 mg total) by mouth every 8 (eight) hours as needed for nausea or vomiting. 12 tablet 0  . ELIQUIS STARTER PACK (ELIQUIS STARTER PACK) 5 MG TABS Take as directed on package: start with two-5mg  tablets twice daily for 7 days. On day 8, switch to one-5mg  tablet twice daily. 1 each 0   . folic acid (FOLVITE) 1 MG tablet Take 1 tablet (1 mg total) by mouth daily. 30 tablet 0  . lisinopril (PRINIVIL,ZESTRIL) 10 MG tablet Take 1 tablet (10 mg total) by mouth daily. 30 tablet 11  . thiamine 100 MG tablet Take 1 tablet (100 mg total) by mouth daily. 30 tablet 0  . benzonatate (TESSALON) 100 MG capsule Take 1 capsule (100 mg total) by mouth 2 (two) times daily. (Patient not taking: Reported on 04/03/2018) 20 capsule 0  . dextromethorphan-guaiFENesin (MUCINEX DM) 30-600 MG 12hr tablet Take 1 tablet by mouth 2 (two) times daily as needed for cough. (Patient not taking: Reported on 04/03/2018) 30 tablet 0   No facility-administered medications prior to visit.      ROS Review of Systems General: negative for fever, weight loss, appetite change Eyes: no visual symptoms. ENT: no ear symptoms, no sinus tenderness, no nasal congestion or sore throat. Neck: no pain  Respiratory: no wheezing, shortness of breath, cough Cardiovascular: no chest pain, no dyspnea on exertion, no pedal edema, no orthopnea. Gastrointestinal: no abdominal pain, no diarrhea, no constipation Genito-Urinary: no urinary frequency, no dysuria, no polyuria. Hematologic: no bruising Endocrine: no cold or heat intolerance Neurological: no headaches, no seizures, no tremors Musculoskeletal: +intermittent hip pain, no joint swelling Skin: no pruritus, no rash. Psychological: no depression, no anxiety,    Objective:  BP 131/86   Pulse 91   Temp 98.1 F (36.7 C) (Oral)  Ht  (1.727 m)   Wt 231 lb 3.2 oz (104.9 kg)   SpO2 99%   BMI 35.15 kg/m   BP/Weight 04/03/2018 03/04/2018 08/29/2017  Systolic BP 131 134 129  Diastolic BP 86 106 101  Wt. (Lbs) 231.2 219.6 200  BMI 35.15 34.39 29.53      Physical Exam Constitutional: normal appearing,  Eyes: PERRLA HEENT: Head is atraumatic, normal sinuses, normal oropharynx, normal appearing tonsils and palate, tympanic membrane is normal bilaterally. Neck:  normal range of motion, no thyromegaly, no JVD Cardiovascular: normal rate and rhythm, normal heart sounds, no murmurs, rub or gallop, no pedal edema Respiratory: Normal breath sounds, clear to auscultation bilaterally, no wheezes, no rales, no rhonchi Abdomen: soft, not tender to palpation, normal bowel sounds, no enlarged organs Musculoskeletal: Full ROM, no tenderness in joints Skin: warm and dry, no lesions. Neurological: alert, oriented x3, cranial nerves I-XII grossly intact , normal motor strength, normal sensation. Psychological: normal mood.   CMP Latest Ref Rng & Units 03/04/2018 03/03/2018 03/01/2018  Glucose 70 - 99 mg/dL 161(W) 94 98  BUN 6 - 20 mg/dL Creatinine 0.61 - 1.24 mg/dL 9.60 4.54 0.98  Sodium 135 - 145 mmol/L 133(L) 137 136  Potassium 3.5 - 5.1 mmol/L 3.2(L) 3.1(L) 4.0  Chloride 98 - 111 mmol/L 97(L) 91(L) 94(L)  CO2 22 - 32 mmol/L Calcium 8.9 - 10.3 mg/dL 8.3(L) 8.3(L) 8.7(L)  Total Protein 6.5 - 8.1 g/dL 6.9 7.1 7.6  Total Bilirubin 0.3 - 1.2 mg/dL 0.8 1.0 1.1(B)  Alkaline Phos 38 - 126 U/L 60 64 84  AST 15 - 41 U/L 57(H) 63(H) 105(H)  ALT 0 - 44 U/L 32 36 50(H)    Lipid Panel  No results found for: CHOL, TRIG, HDL, CHOLHDL, VLDL, LDLCALC, LDLDIRECT  CBC    Component Value Date/Time   WBC 6.9 03/04/2018 0517   RBC 3.70 (L) 03/04/2018 0517   HGB 11.8 (L) 03/04/2018 0517   HCT 33.8 (L) 03/04/2018 0517   PLT 115 (L) 03/04/2018 0517   MCV 91.4 03/04/2018 0517   MCH 31.9 03/04/2018 0517   MCHC 34.9 03/04/2018 0517   RDW 16.3 (H) 03/04/2018 0517   LYMPHSABS 0.7 03/01/2018 1358   MONOABS 0.9 03/01/2018 1358   EOSABS 0.2 03/01/2018 1358   BASOSABS 0.0 03/01/2018 1358    No results found for: HGBA1C  Assessment & Plan:   1. PE (pulmonary thromboembolism) (HCC) Unprovoked Currently on anticoagulation with Xarelto which he will continue until 05/2018 after which he will be off anticoagulation for 2 weeks for a hypercoagulable panel  -  apixaban (ELIQUIS) 5 MG TABS tablet; Take 1 tablet (5 mg total) by mouth 2 (two) times daily.  Dispense: 60 tablet; Refill: 1  2. Hypokalemia Last potassium was 3.2 We will repeat - Basic Metabolic Panel  3. Alcohol abuse - thiamine 100 MG tablet; Take 1 tablet (100 mg total) by mouth daily.  Dispense: 30 tablet; Refill: 3 - folic acid (FOLVITE) 1 MG tablet; Take 1 tablet (1 mg total) by mouth daily.  Dispense: 30 tablet; Refill: 3  4. Hypertension, unspecified type Controlled Counseled on blood pressure goal of less than 130/80, low-sodium, DASH diet, medication compliance, 150 minutes of moderate intensity exercise per week. Discussed medication compliance, adverse effects. - lisinopril (PRINIVIL,ZESTRIL) 10 MG tablet; Take 1 tablet (10 mg total) by mouth daily.  Dispense: 30 tablet; Refill: 6  5. Left hip pain Likely underlying osteoarthritis Advised  to use Tylenol Hold off on NSAIDs due to current anticoagulation   Meds ordered this encounter  Medications  . lisinopril (PRINIVIL,ZESTRIL) 10 MG tablet    Sig: Take 1 tablet (10 mg total) by mouth daily.    Dispense:  30 tablet    Refill:  6  . thiamine 100 MG tablet    Sig: Take 1 tablet (100 mg total) by mouth daily.    Dispense:  30 tablet    Refill:  3  . folic acid (FOLVITE) 1 MG tablet    Sig: Take 1 tablet (1 mg total) by mouth daily.    Dispense:  30 tablet    Refill:  3  . apixaban (ELIQUIS) 5 MG TABS tablet    Sig: Take 1 tablet (5 mg total) by mouth 2 (two) times daily.    Dispense:  60 tablet    Refill:  1    Follow-up: Return in about 2 months (around 06/03/2018) for follow up of chronic medical conditions.       Hoy Register, MD, FAAFP. [facility] [place], [place] [phone]   04/03/2018, 3:11 PM

## 2018-04-03 NOTE — Progress Notes (Signed)
Patient needs refills on all medications

## 2018-04-03 NOTE — Patient Instructions (Signed)
Pulmonary Embolism    A pulmonary embolism (PE) is a sudden blockage or decrease of blood flow in one lung or both lungs. Most blockages come from a blood clot that forms in a lower leg, thigh, or arm vein (deep vein thrombosis, DVT) and travels to the lungs. A clot is blood that has thickened into a gel or solid. PE is a dangerous and life-threatening condition that needs to be treated right away.  What are the causes?  This condition is usually caused by a blood clot that forms in a vein and moves to the lungs. In rare cases, it may be caused by air, fat, part of a tumor, or other tissue that moves through the veins and into the lungs.  What increases the risk?  The following factors may make you more likely to develop this condition:  · Traumatic injury, such as breaking a hip or leg.  · Spinal cord injury.  · Orthopedic surgery, especially hip or knee replacement.  · Any major surgery.  · Stroke.  · Having DVT.  · Blood clots or blood clotting disease.  · Long-term (chronic) lung or heart disease.  · Taking medicines that contain estrogen. These include birth control pills and hormone replacement therapy.  · Cancer and chemotherapy.  · Having a central venous catheter.  · Pregnancy and the period of time after delivery (postpartum).  · Being older than age 60.  · Being overweight.  · Smoking.  What are the signs or symptoms?  Symptoms of this condition usually start suddenly and include:  · Shortness of breath during activity or at rest.  · Coughing or coughing up blood or blood-tinged mucus.  · Chest pain that is often worse with deep breaths.  · Rapid or irregular heartbeat.  · Feeling light-headed or dizzy.  · Fainting.  · Feeling anxious.  · Fever.  · Sweating.  · Pain and swelling in a leg. This is a symptom of DVT, which can lead to PE.  How is this diagnosed?  This condition may be diagnosed based on:  · Your medical history.  · A physical exam.  · Blood tests.  · CT pulmonary angiogram. This test checks  blood flow in and around your lungs.  · Ventilation-perfusion scan, also called a lung VQ scan. This test measures air flow and blood flow to the lungs.  · Ultrasound of the legs.  How is this treated?  Treatment for this condition depends on many factors, such as the cause of your PE, your risk for bleeding or developing more clots, and other medical conditions you have. Treatment aims to remove, dissolve, or stop blood clots from forming or growing larger. Treatment may include:  · Medicines, such as:  ? Blood thinning medicines (anticoagulants) to stop clots from forming or growing.  ? Medicines that dissolve clots (thrombolytics).  · Procedures, such as:  ? Using a flexible tube to remove a blood clot (embolectomy) or deliver medicine to destroy it (catheter-directed thrombolysis).  ? Inserting a filter into a large vein that carries blood to the heart (inferior vena cava). This filter (vena cava filter) catches blood clots before they reach the lungs.  ? Surgery to remove the clot (surgical embolectomy). This is rare.  You may need a combination of immediate, long-term (up to 3 months after diagnosis), and extended (more than 3 months after diagnosis) treatments. Your treatment may continue for several months (maintenance therapy). You and your health care provider will work together   to choose the treatment program that is best for you.  Follow these instructions at home:  Medicines  · Take over-the-counter and prescription medicines only as told by your health care provider.  · If you are taking an anticoagulant medicine:  ? Take the medicine every day at the same time each day.  ? Understand what foods and drugs interact with your medicine.  ? Understand the side effects of this medicine, including excessive bruising or bleeding. Ask your health care provider or pharmacist about other side effects.  General instructions  · Wear a medical alert bracelet or carry a medical alert card that says you have had a PE  and lists what medicines you take.  · Ask your health care provider when you may return to your normal activities. Avoid sitting or lying for a long time without moving.  · Maintain a healthy weight. Ask your health care provider what weight is healthy for you.  · Do not use any products that contain nicotine or tobacco, such as cigarettes and e-cigarettes. If you need help quitting, ask your health care provider.  · Talk with your health care provider about any travel plans. It is important to make sure that you are still able to take your medicine while on trips.  · Keep all follow-up visits as told by your health care provider. This is important.  Contact a health care provider if:  · You missed a dose of your blood thinner medicine.  Get help right away if:  · You have:  ? New or increased pain, swelling, warmth, or redness in an arm or leg.  ? Numbness or tingling in an arm or leg.  ? Shortness of breath during activity or at rest.  ? A fever.  ? Chest pain.  ? A rapid or irregular heartbeat.  ? A severe headache.  ? Vision changes.  ? A serious fall or accident, or you hit your head.  ? Stomach (abdominal) pain.  ? Blood in your vomit, stool, or urine.  ? A cut that will not stop bleeding.  · You cough up blood.  · You feel light-headed or dizzy.  · You cannot move your arms or legs.  · You are confused or have memory loss.  These symptoms may represent a serious problem that is an emergency. Do not wait to see if the symptoms will go away. Get medical help right away. Call your local emergency services (911 in the [place] Do not drive yourself to the hospital.  Summary  · A pulmonary embolism (PE) is a sudden blockage or decrease of blood flow in one lung or both lungs. PE is a dangerous and life-threatening condition that needs to be treated right away.  · Treatments for this condition usually include medicines to thin your blood (anticoagulants) or medicines to break apart blood clots (thrombolytics).  · If  you are given blood thinners, it is important to take the medicine every single day at the same time each day.  · If you have signs of PE or DVT, call your local emergency services (911 in the [place]  This information is not intended to replace advice given to you by your health care provider. Make sure you discuss any questions you have with your health care provider.  Document Released: 01/07/2000 Document Revised: 08/24/2017 Document Reviewed: 02/22/2017  [employer]vier Interactive Patient Education © 2019 [employer]

## 2018-05-02 MED FILL — !ELIQUIS 5MG TABLET: 5 | 30 days supply | Qty: 60 | Fill #1

## 2018-05-02 MED FILL — FOLIC ACID 1 MG TABS: 1 | 30 days supply | Qty: 30 | Fill #1

## 2018-05-02 MED FILL — LISINOPRIL 10 MG TABS: 10 | 30 days supply | Qty: 30 | Fill #1

## 2018-05-16 ENCOUNTER — Encounter: Payer: Self-pay | Admitting: Family Medicine

## 2018-05-16 ENCOUNTER — Other Ambulatory Visit: Payer: Self-pay

## 2018-05-16 ENCOUNTER — Ambulatory Visit: Payer: Self-pay | Attending: Family Medicine | Admitting: Family Medicine

## 2018-05-16 DIAGNOSIS — I2699 Other pulmonary embolism without acute cor pulmonale: Secondary | ICD-10-CM

## 2018-05-16 DIAGNOSIS — T464X5A Adverse effect of angiotensin-converting-enzyme inhibitors, initial encounter: Secondary | ICD-10-CM

## 2018-05-16 DIAGNOSIS — R05 Cough: Secondary | ICD-10-CM

## 2018-05-16 DIAGNOSIS — I10 Essential (primary) hypertension: Secondary | ICD-10-CM

## 2018-05-16 DIAGNOSIS — E876 Hypokalemia: Secondary | ICD-10-CM

## 2018-05-16 MED ORDER — POTASSIUM CHLORIDE CRYS ER 20 MEQ PO TBCR: 20.0000 meq | EXTENDED_RELEASE_TABLET | Freq: Every day | ORAL | 3 refills | Status: DC

## 2018-05-16 MED ORDER — CARVEDILOL 6.25 MG PO TABS: 3.1250 mg | ORAL_TABLET | Freq: Two times a day (BID) | ORAL | 3 refills | Status: DC

## 2018-05-16 NOTE — Progress Notes (Signed)
Virtual Visit via Telephone Note  I connected with Daniel AntuKeith T Walmer, on 05/16/2018 at 9:53 AM by telephone and verified that I am speaking with the correct person using two identifiers.   Consent: I discussed the limitations, risks, security and privacy concerns of performing an evaluation and management service by telephone and the availability of in person appointments. I also discussed with the patient that there may be a patient responsible charge related to this service. The patient expressed understanding and agreed to proceed.   Location of Patient: Home  Location of Provider: Clinic   Persons participating in Telemedicine visit: [name]  Alicia Farrington-CMA Dr. Nelwyn SalisburyNewlin-PCP     History of Present Illness: Daniel [name] is a 48 year old male with a history of alcohol abuse, hypertension recently diagnosed with pulmonary embolism during hospitalization from 03/01/2018 through 03/04/2018.He was managed for acute respiratory failure presumed to be secondary to PE which was confirmed by chest CTA findings.  Echocardiogram was negative for right heart strain and revealed normal EF of 55 to 60%.  At his last visit we had discussed continuing his anticoagulation for 3 months and hypercoagulable panel ordered 2 weeks after completion.  He denies bleeding or bruising with Eliquis and denies chest pain or dyspnea. Today he complains of dry cough and is unsure if this is related to one of his medications.  Denies postnasal drip, sinus congestion, shortness of breath or wheezing. He gets embarrassed because people give him the stare as if he has the coronavirus. He is currently on lisinopril for hypertension and as well as his hypokalemia with his last potassium at 3.2. He checks his blood pressure at home and last blood pressure reading was 171/114 despite taking his lisinopril.  Past Medical History:  Diagnosis Date  . Asthma   . Asthma   . Hypertension    No Known  Allergies  Current Outpatient Medications on File Prior to Visit  Medication Sig Dispense Refill  . albuterol (PROVENTIL HFA;VENTOLIN HFA) 108 (90 Base) MCG/ACT inhaler Inhale 1 puff into the lungs every 6 (six) hours as needed for wheezing or shortness of breath. 1 Inhaler 0  . apixaban (ELIQUIS) 5 MG TABS tablet Take 1 tablet (5 mg total) by mouth 2 (two) times daily. 60 tablet 1  . folic acid (FOLVITE) 1 MG tablet Take 1 tablet (1 mg total) by mouth daily. 30 tablet 3  . lisinopril (PRINIVIL,ZESTRIL) 10 MG tablet Take 1 tablet (10 mg total) by mouth daily. 30 tablet 6  . ondansetron (ZOFRAN) 4 MG tablet Take 1 tablet (4 mg total) by mouth every 8 (eight) hours as needed for nausea or vomiting. 12 tablet 0  . benzonatate (TESSALON) 100 MG capsule Take 1 capsule (100 mg total) by mouth 2 (two) times daily. (Patient not taking: Reported on 04/03/2018) 20 capsule 0  . dextromethorphan-guaiFENesin (MUCINEX DM) 30-600 MG 12hr tablet Take 1 tablet by mouth 2 (two) times daily as needed for cough. (Patient not taking: Reported on 04/03/2018) 30 tablet 0  . Multiple Vitamin (MULTIVITAMIN WITH MINERALS) TABS tablet Take 1 tablet by mouth daily. (Patient not taking: Reported on 05/16/2018) 30 tablet 0  . thiamine 100 MG tablet Take 1 tablet (100 mg total) by mouth daily. (Patient not taking: Reported on 05/16/2018) 30 tablet 3   No current facility-administered medications on file prior to visit.     Observations/Objective: Awake, alert, oriented x3 Not in acute distress  CMP Latest Ref Rng & Units 03/04/2018 03/03/2018 03/01/2018  Glucose 70 - 99 mg/dL 762(G) 94 98  BUN 6 - 20 mg/dL 9 10 12   Creatinine 0.61 - 1.24 mg/dL 3.15 1.76 1.60  Sodium 135 - 145 mmol/L 133(L) 137 136  Potassium 3.5 - 5.1 mmol/L 3.2(L) 3.1(L) 4.0  Chloride 98 - 111 mmol/L 97(L) 91(L) 94(L)  CO2 22 - 32 mmol/L 27 26 22   Calcium 8.9 - 10.3 mg/dL 8.3(L) 8.3(L) 8.7(L)  Total Protein 6.5 - 8.1 g/dL 6.9 7.1 7.6  Total Bilirubin 0.3 -  1.2 mg/dL 0.8 1.0 7.3(X)  Alkaline Phos 38 - 126 U/L 60 64 84  AST 15 - 41 U/L 57(H) 63(H) 105(H)  ALT 0 - 44 U/L 32 36 50(H)    Lipid Panel  No results found for: CHOL, TRIG, HDL, CHOLHDL, VLDL, LDLCALC, LDLDIRECT    Assessment and Plan: 1. PE (pulmonary thromboembolism) (HCC) Continue anticoagulation with Eliquis Anticoagulation will be complete on 06/02/2018 We will order hypercoagulable panel 2 weeks after - Lupus anticoagulant; Future - Factor V Leiden; Future - Homocysteine; Future - Protein C, total; Future - Protein S, total; Future - Basic Metabolic Panel; Future  2. Hypertension, unspecified type Discontinue lisinopril due to cough - carvedilol (COREG) 6.25 MG tablet; Take 0.5 tablets (3.125 mg total) by mouth 2 (two) times daily with a meal.  Dispense: 60 tablet; Refill: 3  3. Cough due to ACE inhibitor See #2 above  4. Hypokalemia Potassium was 3.2 Would love to replace lisinopril with losartan however losartan is on back order. Placed on potassium - potassium chloride SA (K-DUR) 20 MEQ tablet; Take 1 tablet (20 mEq total) by mouth daily.  Dispense: 30 tablet; Refill: 3   Follow Up Instructions: 3 months   I discussed the assessment and treatment plan with the patient. The patient was provided an opportunity to ask questions and all were answered. The patient agreed with the plan and demonstrated an understanding of the instructions.   The patient was advised to call back or seek an in-person evaluation if the symptoms worsen or if the condition fails to improve as anticipated.     I provided 25 minutes total of non-face-to-face time during this encounter including median intraservice time, reviewing previous notes, labs, imaging, medications and explaining diagnosis and management.     Hoy Register, MD, FAAFP. [facility] [place], [place] [phone]   05/16/2018, 9:53 AM

## 2018-05-16 NOTE — Progress Notes (Signed)
Patient has been called and DOB has been verified. Patient has been screened and transferred to PCP to start phone visit.  C/C: Hypertension, cough.

## 2018-05-31 ENCOUNTER — Other Ambulatory Visit: Payer: Self-pay | Admitting: Family Medicine

## 2018-05-31 ENCOUNTER — Telehealth: Payer: Self-pay | Admitting: Family Medicine

## 2018-05-31 DIAGNOSIS — I2699 Other pulmonary embolism without acute cor pulmonale: Secondary | ICD-10-CM

## 2018-05-31 MED FILL — CARVEDILOL 6.25 MG TABLET: 6.25 | 30 days supply | Qty: 30 | Fill #0

## 2018-05-31 MED FILL — POTASSIUM CL ER 20 MEQ TAB: 20 | 30 days supply | Qty: 30 | Fill #0

## 2018-05-31 NOTE — Telephone Encounter (Signed)
Patient called stating he would like to know if he should go ahead and begin taking his (refill) eliquis now or if he should wait until getting his lab work and only take what he actively has . Please follow up

## 2018-05-31 NOTE — Telephone Encounter (Signed)
Patients call returned.  Patient identified by name and date of birth.  Patient called wanting to know if he should still be taking his Eliquis.   Patient was running out of pills and wanted to know whether he still needed to take the medication since he had a visit on the 22nd of this month.  Patient given instructions to refill prescription.  Patient acknowledged understanding of advice and instructions.

## 2018-06-03 ENCOUNTER — Ambulatory Visit: Payer: Self-pay | Admitting: Family Medicine

## 2018-06-03 ENCOUNTER — Telehealth: Payer: Self-pay | Admitting: Family Medicine

## 2018-06-03 ENCOUNTER — Telehealth: Payer: Self-pay

## 2018-06-03 DIAGNOSIS — F1010 Alcohol abuse, uncomplicated: Secondary | ICD-10-CM

## 2018-06-03 MED ORDER — FOLIC ACID 1 MG PO TABS: 1.0000 mg | ORAL_TABLET | Freq: Every day | ORAL | 3 refills | Status: DC

## 2018-06-03 MED FILL — FOLIC ACID 1 MG TABS: 1 | 30 days supply | Qty: 30 | Fill #0

## 2018-06-03 NOTE — Telephone Encounter (Signed)
New Message  Pt states he was told to come off his Eliquis for his blood work and he wants to know if he should still refill it. Please f/u

## 2018-06-03 NOTE — Telephone Encounter (Signed)
Patient is requesting a refill on folic acid.

## 2018-06-03 NOTE — Telephone Encounter (Signed)
Refilled

## 2018-06-03 NOTE — Telephone Encounter (Signed)
Please review my last note. 3 month course of anticoagulation should have been completed on 06/02/2018 and he is to remain off anticoagulation for 2 weeks and then undergo labs which have been ordered (please reschedule his lab appt to 2 weeks from now) after which decision to permanently discontinue anticoagulation or resume anticoagulation will be made. Thank you.

## 2018-06-03 NOTE — Telephone Encounter (Signed)
Patient was called and informed of PCP orders. 

## 2018-06-03 NOTE — Telephone Encounter (Signed)
Patient was called and informed of PCP orders for elquis.

## 2018-06-14 ENCOUNTER — Other Ambulatory Visit: Payer: Self-pay

## 2018-06-19 LAB — LUPUS ANTICOAGULANT
Dilute Viper Venom Time: 36.1 s (ref 0.0–47.0)
PTT Lupus Anticoagulant: 29.8 s (ref 0.0–51.9)
Thrombin Time: 17.9 s (ref 0.0–23.0)
dPT Confirm Ratio: 1.26 Ratio (ref 0.00–1.40)
dPT: 32.9 s (ref 0.0–55.0)

## 2018-06-19 LAB — FACTOR V LEIDEN

## 2018-06-19 LAB — BASIC METABOLIC PANEL
BUN/Creatinine Ratio: 12 (ref 9–20)
BUN: 10 mg/dL (ref 6–24)
CO2: 20 mmol/L (ref 20–29)
Calcium: 10.3 mg/dL — ABNORMAL HIGH (ref 8.7–10.2)
Chloride: 99 mmol/L (ref 96–106)
Creatinine, Ser: 0.85 mg/dL (ref 0.76–1.27)
GFR calc Af Amer: 122 mL/min/{1.73_m2} (ref >59)
GFR calc non Af Amer: 105 mL/min/{1.73_m2} (ref >59)
Glucose: 108 mg/dL — ABNORMAL HIGH (ref 65–99)
Potassium: 4.7 mmol/L (ref 3.5–5.2)
Sodium: 136 mmol/L (ref 134–144)

## 2018-06-19 LAB — HOMOCYSTEINE: Homocysteine: 12.4 umol/L (ref 0.0–14.5)

## 2018-06-19 LAB — PROTEIN S, TOTAL: Protein S Ag, Total: 83 % (ref 60–150)

## 2018-06-19 LAB — PROTEIN C, TOTAL: Protein C Antigen: 115 % (ref 60–150)

## 2018-06-21 ENCOUNTER — Telehealth: Payer: Self-pay

## 2018-06-21 NOTE — Telephone Encounter (Signed)
Patient was called and informed of lab results and to discontinue Eliquis.

## 2018-06-21 NOTE — Telephone Encounter (Signed)
-----   Message from Hoy Register, MD sent at 06/20/2018 12:21 PM EDT ----- Labs are normal and do not indicate presence of a clotting disorder.  He no longer needs to remain on Eliquis and can discontinue it.

## 2018-07-01 MED FILL — ?FOLIC ACID 1MG TAB: 1 | 30 days supply | Qty: 30 | Fill #1

## 2018-07-01 MED FILL — POTASSIUM CL ER 20 MEQ TAB: 20 | 30 days supply | Qty: 30 | Fill #1

## 2018-07-01 MED FILL — ?CARVEDILOL 6.25 MG TABLET: 6.25 | 30 days supply | Qty: 30 | Fill #1

## 2018-07-10 ENCOUNTER — Telehealth: Payer: Self-pay | Admitting: Family Medicine

## 2018-07-10 DIAGNOSIS — I10 Essential (primary) hypertension: Secondary | ICD-10-CM

## 2018-07-10 NOTE — Telephone Encounter (Signed)
Patient would like to know if he is able to take carvedilol (COREG) 6.25 MG tablet [[id number]] the full tablet instead of a half twice daily, patient states that the tablets are to hard to break up, please follow up.

## 2018-07-11 MED ORDER — CARVEDILOL 3.125 MG PO TABS: 3.1250 mg | ORAL_TABLET | Freq: Two times a day (BID) | ORAL | 3 refills | Status: DC

## 2018-07-11 NOTE — Telephone Encounter (Signed)
I have written a new prescription for the 3.125 mg which he will take twice daily

## 2018-07-11 NOTE — Telephone Encounter (Signed)
Will route to PCP for review. 

## 2018-07-17 ENCOUNTER — Telehealth: Payer: Self-pay | Admitting: Family Medicine

## 2018-07-17 NOTE — Telephone Encounter (Signed)
Patient was called and informed how to correctly take blood pressure medications.

## 2018-07-17 NOTE — Telephone Encounter (Signed)
wants to know how to take a pill

## 2018-07-30 MED FILL — ?FOLIC ACID 1MG TAB: 1 | 30 days supply | Qty: 30 | Fill #2

## 2018-07-30 MED FILL — POTASSIUM CL ER 20 MEQ TAB: 20 | 30 days supply | Qty: 30 | Fill #2

## 2018-07-30 MED FILL — ?CARVEDILOL 6.25 MG TABLET: 6.25 | 30 days supply | Qty: 30 | Fill #2

## 2018-08-20 ENCOUNTER — Ambulatory Visit: Payer: Self-pay | Admitting: Family Medicine

## 2018-08-26 ENCOUNTER — Ambulatory Visit: Payer: Self-pay | Admitting: Family Medicine

## 2018-09-09 ENCOUNTER — Other Ambulatory Visit: Payer: Self-pay

## 2018-09-09 DIAGNOSIS — Z20822 Contact with and (suspected) exposure to covid-19: Secondary | ICD-10-CM

## 2018-09-11 LAB — NOVEL CORONAVIRUS, NAA: SARS-CoV-2, NAA: DETECTED — AB

## 2018-09-13 ENCOUNTER — Telehealth: Payer: Self-pay | Admitting: Family Medicine

## 2018-09-13 NOTE — Telephone Encounter (Signed)
Patient states that he is covid positive and his appointment next week will be a televisit.

## 2018-09-13 NOTE — Telephone Encounter (Signed)
Patient called stating he had a question in regards to his medication. Did not disclose in regards to which medication. Please follow up.

## 2018-09-17 ENCOUNTER — Other Ambulatory Visit: Payer: Self-pay

## 2018-09-17 ENCOUNTER — Ambulatory Visit: Payer: HRSA Program | Attending: Family Medicine | Admitting: Family Medicine

## 2018-09-17 ENCOUNTER — Encounter: Payer: Self-pay | Admitting: Family Medicine

## 2018-09-17 DIAGNOSIS — E876 Hypokalemia: Secondary | ICD-10-CM

## 2018-09-17 DIAGNOSIS — I10 Essential (primary) hypertension: Secondary | ICD-10-CM

## 2018-09-17 DIAGNOSIS — U071 COVID-19: Secondary | ICD-10-CM | POA: Diagnosis not present

## 2018-09-17 MED ORDER — POTASSIUM CHLORIDE CRYS ER 20 MEQ PO TBCR: 20.0000 meq | EXTENDED_RELEASE_TABLET | Freq: Every day | ORAL | 6 refills | Status: DC

## 2018-09-17 MED ORDER — CARVEDILOL 3.125 MG PO TABS: 3.1250 mg | ORAL_TABLET | Freq: Two times a day (BID) | ORAL | 6 refills | Status: DC

## 2018-09-17 MED FILL — ?CARVEDILOL 3.125 MG TABLET: 3.125 | 30 days supply | Qty: 60 | Fill #0

## 2018-09-17 MED FILL — POTASSIUM CL ER 20 MEQ TAB: 20 | 30 days supply | Qty: 30 | Fill #0

## 2018-09-17 NOTE — Progress Notes (Signed)
Virtual Visit via Telephone Note  I connected with Daniel AntuKeith T Samonte, on 09/17/2018 at 9:32 AM by telephone due to the COVID-19 pandemic and verified that I am speaking with the correct person using two identifiers.   Consent: I discussed the limitations, risks, security and privacy concerns of performing an evaluation and management service by telephone and the availability of in person appointments. I also discussed with the patient that there may be a patient responsible charge related to this service. The patient expressed understanding and agreed to proceed.   Location of Patient: Home  Location of Provider: Clinic   Persons participating in Telemedicine visit: Rosezella FloridaKeith T Lippmann Alicia Farrington-CMA Dr. Nelwyn SalisburyNewlin-PCP     History of Present Illness: Daniel [name] is a 48 year old male with a history of alcohol abuse, hypertension, pulmonary embolism diagnosed  during hospitalization from 03/01/2018 through 03/04/2018. He completed a course of anticoagulation with Eliquis and hypercoagulable panel was negative.  He recently tested positive for SARS-CoV-2 on 09/09/2018 after exposure on 09/02/18 to his sister who works in a daycare.  He did have a sensation of abnormal taste which has resolved and he denies chest pains or dyspnea and has had no fever. He has no additional concerns today. Compliant with his antihypertensive.    Past Medical History:  Diagnosis Date  . Asthma   . Asthma   . Hypertension    No Known Allergies  Current Outpatient Medications on File Prior to Visit  Medication Sig Dispense Refill  . albuterol (PROVENTIL HFA;VENTOLIN HFA) 108 (90 Base) MCG/ACT inhaler Inhale 1 puff into the lungs every 6 (six) hours as needed for wheezing or shortness of breath. 1 Inhaler 0  . carvedilol (COREG) 3.125 MG tablet Take 1 tablet (3.125 mg total) by mouth 2 (two) times daily with a meal. 60 tablet 3  . folic acid (FOLVITE) 1 MG tablet Take 1 tablet (1 mg total) by mouth  daily. 30 tablet 3  . potassium chloride SA (K-DUR) 20 MEQ tablet Take 1 tablet (20 mEq total) by mouth daily. 30 tablet 3  . apixaban (ELIQUIS) 5 MG TABS tablet Take 1 tablet (5 mg total) by mouth 2 (two) times daily. (Patient not taking: Reported on 09/17/2018) 60 tablet 1  . benzonatate (TESSALON) 100 MG capsule Take 1 capsule (100 mg total) by mouth 2 (two) times daily. (Patient not taking: Reported on 04/03/2018) 20 capsule 0  . dextromethorphan-guaiFENesin (MUCINEX DM) 30-600 MG 12hr tablet Take 1 tablet by mouth 2 (two) times daily as needed for cough. (Patient not taking: Reported on 04/03/2018) 30 tablet 0  . Multiple Vitamin (MULTIVITAMIN WITH MINERALS) TABS tablet Take 1 tablet by mouth daily. (Patient not taking: Reported on 05/16/2018) 30 tablet 0  . ondansetron (ZOFRAN) 4 MG tablet Take 1 tablet (4 mg total) by mouth every 8 (eight) hours as needed for nausea or vomiting. (Patient not taking: Reported on 09/17/2018) 12 tablet 0  . thiamine 100 MG tablet Take 1 tablet (100 mg total) by mouth daily. (Patient not taking: Reported on 05/16/2018) 30 tablet 3   No current facility-administered medications on file prior to visit.     Observations/Objective: Awake, alert, oriented x3 Not in acute distress  Assessment and Plan: 1. Hypertension, unspecified type Controlled Counseled on blood pressure goal of less than 130/80, low-sodium, DASH diet, medication compliance, 150 minutes of moderate intensity exercise per week. Discussed medication compliance, adverse effects. - carvedilol (COREG) 3.125 MG tablet; Take 1 tablet (3.125 mg total) by mouth 2 (  two) times daily with a meal.  Dispense: 60 tablet; Refill: 6 - Basic Metabolic Panel; Future  2. Hypokalemia - potassium chloride SA (K-DUR) 20 MEQ tablet; Take 1 tablet (20 mEq total) by mouth daily.  Dispense: 30 tablet; Refill: 6  3. COVID-19 virus detected He will self isolate until at least 10 days from the symptom onset and 3 days  afebrile without the use of antipyretics which will help him to be on 09/19/2018 Counseled on precautionary measures including social distancing, use of facemask and regular washing of hands   Follow Up Instructions: Return in about 3 months (around 12/18/2018) for medical conditions.    I discussed the assessment and treatment plan with the patient. The patient was provided an opportunity to ask questions and all were answered. The patient agreed with the plan and demonstrated an understanding of the instructions.   The patient was advised to call back or seek an in-person evaluation if the symptoms worsen or if the condition fails to improve as anticipated.     I provided 11 minutes total of non-face-to-face time during this encounter including median intraservice time, reviewing previous notes, labs, imaging, medications, management and patient verbalized understanding.     Charlott Rakes, MD, FAAFP. [facility] and [facility], [place]   09/17/2018, 9:32 AM

## 2018-09-17 NOTE — Progress Notes (Signed)
Patient has been called and DOB has been verified. Patient has been screened and transferred to PCP to start phone visit.     

## 2018-09-27 MED FILL — ?FOLIC ACID 1MG TAB: 1 | 30 days supply | Qty: 30 | Fill #3

## 2018-09-27 MED FILL — POTASSIUM CL ER 20 MEQ TAB: 20 | 30 days supply | Qty: 30 | Fill #3

## 2018-09-27 MED FILL — ?CARVEDILOL 6.25 MG TABLET: 6.25 | 30 days supply | Qty: 30 | Fill #3

## 2018-10-18 ENCOUNTER — Ambulatory Visit: Payer: Self-pay | Attending: Family Medicine

## 2018-10-18 ENCOUNTER — Other Ambulatory Visit: Payer: Self-pay

## 2018-10-18 DIAGNOSIS — I10 Essential (primary) hypertension: Secondary | ICD-10-CM

## 2018-10-19 LAB — BASIC METABOLIC PANEL
BUN/Creatinine Ratio: 9 (ref 9–20)
BUN: 9 mg/dL (ref 6–24)
CO2: 26 mmol/L (ref 20–29)
Calcium: 9.8 mg/dL (ref 8.7–10.2)
Chloride: 98 mmol/L (ref 96–106)
Creatinine, Ser: 1.02 mg/dL (ref 0.76–1.27)
GFR calc Af Amer: 101 mL/min/{1.73_m2} (ref >59)
GFR calc non Af Amer: 88 mL/min/{1.73_m2} (ref >59)
Glucose: 131 mg/dL — ABNORMAL HIGH (ref 65–99)
Potassium: 4.4 mmol/L (ref 3.5–5.2)
Sodium: 139 mmol/L (ref 134–144)

## 2018-10-25 ENCOUNTER — Ambulatory Visit: Payer: Self-pay | Attending: Family Medicine | Admitting: Pharmacist

## 2018-10-25 ENCOUNTER — Other Ambulatory Visit: Payer: Self-pay

## 2018-10-25 ENCOUNTER — Telehealth: Payer: Self-pay

## 2018-10-25 DIAGNOSIS — Z23 Encounter for immunization: Secondary | ICD-10-CM

## 2018-10-25 NOTE — Progress Notes (Signed)
Patient presents for vaccination against influenza per orders of Dr. Newlin. Consent given. Counseling provided. No contraindications exists. Vaccine administered without incident.   

## 2018-10-25 NOTE — Telephone Encounter (Signed)
Patient name and DOB has been verified Patient was informed of lab results. Patient had no questions.  

## 2018-10-25 NOTE — Telephone Encounter (Signed)
-----   Message from Charlott Rakes, MD sent at 10/22/2018  5:00 PM EDT ----- Please inform the patient that labs are normal. Thank you.

## 2018-11-05 ENCOUNTER — Other Ambulatory Visit: Payer: Self-pay | Admitting: Pharmacist

## 2018-11-05 MED ORDER — ALBUTEROL SULFATE HFA 108 (90 BASE) MCG/ACT IN AERS: 1.0000 | INHALATION_SPRAY | Freq: Four times a day (QID) | RESPIRATORY_TRACT | 2 refills | Status: DC | PRN

## 2018-11-06 MED FILL — !VENTOLIN HFA INHALER: 108 (90 BAS | 25 days supply | Qty: 18 | Fill #0

## 2018-11-24 ENCOUNTER — Encounter (HOSPITAL_COMMUNITY): Payer: Self-pay | Admitting: Emergency Medicine

## 2018-11-24 ENCOUNTER — Other Ambulatory Visit: Payer: Self-pay

## 2018-11-24 ENCOUNTER — Emergency Department (HOSPITAL_COMMUNITY): Payer: Self-pay

## 2018-11-24 ENCOUNTER — Emergency Department (HOSPITAL_COMMUNITY)
Admission: EM | Admit: 2018-11-24 | Discharge: 2018-11-24 | Disposition: A | Discharge status: Home / Self Care | Payer: Self-pay | Attending: Emergency Medicine | Admitting: Emergency Medicine

## 2018-11-24 DIAGNOSIS — Z7901 Long term (current) use of anticoagulants: Secondary | ICD-10-CM | POA: Insufficient documentation

## 2018-11-24 DIAGNOSIS — F17210 Nicotine dependence, cigarettes, uncomplicated: Secondary | ICD-10-CM | POA: Insufficient documentation

## 2018-11-24 DIAGNOSIS — J45909 Unspecified asthma, uncomplicated: Secondary | ICD-10-CM | POA: Insufficient documentation

## 2018-11-24 DIAGNOSIS — I10 Essential (primary) hypertension: Secondary | ICD-10-CM | POA: Insufficient documentation

## 2018-11-24 DIAGNOSIS — Z79899 Other long term (current) drug therapy: Secondary | ICD-10-CM | POA: Insufficient documentation

## 2018-11-24 DIAGNOSIS — L03113 Cellulitis of right upper limb: Secondary | ICD-10-CM | POA: Insufficient documentation

## 2018-11-24 LAB — BASIC METABOLIC PANEL
Anion gap: 12 (ref 5–15)
BUN: 5 mg/dL — ABNORMAL LOW (ref 6–20)
CO2: 24 mmol/L (ref 22–32)
Calcium: 9 mg/dL (ref 8.9–10.3)
Chloride: 101 mmol/L (ref 98–111)
Creatinine, Ser: 0.97 mg/dL (ref 0.61–1.24)
GFR calc Af Amer: >60 mL/min (ref >60)
GFR calc non Af Amer: >60 mL/min (ref >60)
Glucose, Bld: 84 mg/dL (ref 70–99)
Potassium: 3.8 mmol/L (ref 3.5–5.1)
Sodium: 137 mmol/L (ref 135–145)

## 2018-11-24 LAB — CBC WITH DIFFERENTIAL/PLATELET
Abs Immature Granulocytes: 0.03 10*3/uL (ref 0.00–0.07)
Basophils Absolute: 0.1 10*3/uL (ref 0.0–0.1)
Basophils Relative: 1 %
Eosinophils Absolute: 0.2 10*3/uL (ref 0.0–0.5)
Eosinophils Relative: 2 %
HCT: 41.5 % (ref 39.0–52.0)
Hemoglobin: 14.2 g/dL (ref 13.0–17.0)
Immature Granulocytes: 0 %
Lymphocytes Relative: 17 %
Lymphs Abs: 1.7 10*3/uL (ref 0.7–4.0)
MCH: 32.1 pg (ref 26.0–34.0)
MCHC: 34.2 g/dL (ref 30.0–36.0)
MCV: 93.7 fL (ref 80.0–100.0)
Monocytes Absolute: 0.9 10*3/uL (ref 0.1–1.0)
Monocytes Relative: 9 %
Neutro Abs: 7.2 10*3/uL (ref 1.7–7.7)
Neutrophils Relative %: 71 %
Platelets: 320 10*3/uL (ref 150–400)
RBC: 4.43 MIL/uL (ref 4.22–5.81)
RDW: 15.4 % (ref 11.5–15.5)
WBC: 10 10*3/uL (ref 4.0–10.5)
nRBC: 0 % (ref 0.0–0.2)

## 2018-11-24 LAB — SEDIMENTATION RATE: Sed Rate: 20 mm/hr — ABNORMAL HIGH (ref 0–16)

## 2018-11-24 LAB — URIC ACID: Uric Acid, Serum: 8.7 mg/dL — ABNORMAL HIGH (ref 3.7–8.6)

## 2018-11-24 LAB — C-REACTIVE PROTEIN: CRP: <0.8 mg/dL (ref <1.0)

## 2018-11-24 MED ORDER — CLINDAMYCIN PHOSPHATE 600 MG/50ML IV SOLN
600.0000 mg | Freq: Once | INTRAVENOUS | Status: DC
  Filled 2018-11-24: qty 50

## 2018-11-24 MED ORDER — CLINDAMYCIN HCL 300 MG PO CAPS: 300.0000 mg | ORAL_CAPSULE | Freq: Four times a day (QID) | ORAL | 0 refills | Status: AC

## 2018-11-24 MED ORDER — CLINDAMYCIN HCL 150 MG PO CAPS
600.0000 mg | ORAL_CAPSULE | Freq: Once | ORAL | Status: AC
  Administered 2018-11-24: 600 mg via ORAL
  Filled 2018-11-24: qty 4

## 2018-11-24 MED ORDER — CLINDAMYCIN HCL 300 MG PO CAPS: 300.0000 mg | ORAL_CAPSULE | Freq: Four times a day (QID) | ORAL | 0 refills | Status: DC

## 2018-11-24 NOTE — ED Provider Notes (Signed)
[facility] EM[facility]GENCY [facility] Provider Note   CSN: [id number] Arrival date & time: 11/24/18  1011     History   Chief Complaint Chief Complaint  Patient presents with  . hand swelling    HPI Daniel [name] is a 48 y.o. male.     48 year old male presents with complaint of pain and swelling in his right hand.  Patient first noticed the pain and swelling 2 days ago, states progressively worsening pain and swelling, denies any injury to the hand recently, unable to determine what caused swelling, no history of same previously.  Patient states he may have broken this hand several years ago, no history of gout.  Pain is worse with movement of the hand, radiates up his forearm, notes pain and swelling along the lateral aspect of the right hand.  Patient is right-hand dominant.  No other injuries, complaints or concerns.  Patient is not a diabetic.     Past Medical History:  Diagnosis Date  . Asthma   . Asthma   . Hypertension     Patient Active Problem List   Diagnosis Date Noted  . Hypomagnesemia 03/03/2018  . HTN (hypertension) 03/02/2018  . Alcoholic hepatitis without ascites 03/02/2018  . Chronic anemia 03/02/2018  . Alcohol abuse 03/01/2018  . Tobacco abuse 03/01/2018  . PE (pulmonary thromboembolism) (HCC) 03/01/2018  . Acute respiratory failure with hypoxia (HCC) 03/01/2018  . Thrombocytopenia (HCC) 03/01/2018  . Asthma 03/01/2018  . Exacerbation of asthma   . Alcohol dependence (HCC) 09/05/2012  . 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 Dental procedure          Home Medications    Prior to Admission medications   Medication Sig Start Date End Date Taking? Authorizing Provider  albuterol (VENTOLIN HFA) 108 (90 Base) MCG/ACT inhaler Inhale 1 puff into the lungs every 6 (six) hours as needed for wheezing or shortness of breath.  11/05/18   Hoy RegisterNewlin, Enobong, MD  apixaban (ELIQUIS) 5 MG TABS tablet Take 1 tablet (5 mg total) by mouth 2 (two) times daily. Patient not taking: Reported on 09/17/2018 04/03/18   Hoy RegisterNewlin, Enobong, MD  benzonatate (TESSALON) 100 MG capsule Take 1 capsule (100 mg total) by mouth 2 (two) times daily. Patient not taking: Reported on 04/03/2018 03/04/18   Roberto ScalesNettey, Shayla D, MD  carvedilol (COREG) 3.125 MG tablet Take 1 tablet (3.125 mg total) by mouth 2 (two) times daily with a meal. 09/17/18   Hoy RegisterNewlin, Enobong, MD  clindamycin (CLEOCIN) 300 MG capsule Take 1 capsule (300 mg total) by mouth every 6 (six) hours for 7 days. 11/24/18 12/01/18  Jeannie FendMurphy, Lakitha Gordy A, PA-C  dextromethorphan-guaiFENesin (MUCINEX DM) 30-600 MG 12hr tablet Take 1 tablet by mouth 2 (two) times daily as needed for cough. Patient not taking: Reported on 04/03/2018 03/04/18   Roberto ScalesNettey, Shayla D, MD  folic acid (FOLVITE) 1 MG tablet Take 1 tablet (1 mg total) by mouth daily. 06/03/18   Hoy RegisterNewlin, Enobong, MD  Multiple Vitamin (MULTIVITAMIN WITH MINERALS) TABS tablet Take 1 tablet by mouth daily. Patient not taking: Reported on 05/16/2018 03/05/18   Laverna PeaceNettey, Shayla D, MD  ondansetron (ZOFRAN) 4 MG tablet Take 1 tablet (4 mg total) by mouth every 8 (eight) hours as needed for nausea or vomiting. Patient not taking: Reported on 09/17/2018 03/01/18   Caccavale, Sophia, PA-C  potassium chloride SA (K-DUR) 20 MEQ tablet Take 1  tablet (20 mEq total) by mouth daily. 09/17/18   Charlott Rakes, MD  thiamine 100 MG tablet Take 1 tablet (100 mg total) by mouth daily. Patient not taking: Reported on 05/16/2018 04/03/18   Charlott Rakes, MD    Family History Family History  Problem Relation Age of Onset  . Heart attack Mother     Social History Social History   Tobacco Use  . Smoking status: Current Some Day Smoker    Packs/day: 0.50    Types: Cigarettes  . Smokeless tobacco: Never Used  Substance Use Topics  . Alcohol use: Yes    Comment: occasional  . Drug  use: Not Currently     Allergies   Patient has no known allergies.   Review of Systems Review of Systems  Constitutional: Negative for fever.  Musculoskeletal: Positive for arthralgias, joint swelling and myalgias.  Skin: Positive for color change. Negative for wound.  Allergic/Immunologic: Negative for immunocompromised state.  Neurological: Negative for weakness and numbness.  Hematological: Negative for adenopathy. Does not bruise/bleed easily.  All other systems reviewed and are negative.    Physical Exam Updated Vital Signs BP (!) 152/77 (BP Location: Right Arm)   Pulse 90   Temp 98.4 F (36.9 C) (Oral)   SpO2 99%   Physical Exam Vitals signs and nursing note reviewed.  Constitutional:      General: He is not in acute distress.    Appearance: He is well-developed. He is not diaphoretic.  HENT:     Head: Normocephalic and atraumatic.  Cardiovascular:     Pulses: Normal pulses.  Pulmonary:     Effort: Pulmonary effort is normal.  Musculoskeletal:        General: Swelling and tenderness present. No deformity or signs of injury.       Hands:  Skin:    General: Skin is warm and dry.     Findings: Erythema present.  Neurological:     Mental Status: He is alert and oriented to person, place, and time.  Psychiatric:        Behavior: Behavior normal.      ED Treatments / Results  Labs (all labs ordered are listed, but only abnormal results are displayed) Labs Reviewed  SEDIMENTATION RATE - Abnormal; Notable for the following components:      Result Value   Sed Rate 20 (*)    All other components within normal limits  BASIC METABOLIC PANEL - Abnormal; Notable for the following components:   BUN 5 (*)    All other components within normal limits  URIC ACID - Abnormal; Notable for the following components:   Uric Acid, Serum 8.7 (*)    All other components within normal limits  C-REACTIVE PROTEIN  CBC WITH DIFFERENTIAL/PLATELET    EKG None   Radiology Dg Hand Complete Right  Result Date: 11/24/2018 CLINICAL DATA:  Pt c/o R hand swelling and throbbing for the past few days, denies injury to R hand. Pt states he thinks he may have broken his R pinky finger in the past. EXAM: RIGHT HAND - COMPLETE 3+ VIEW COMPARISON:  None. FINDINGS: No acute fracture.  No bone lesion. Widening and irregularity at the base of the fifth metacarpal is consistent with an old, healed fracture, with an additional old healed fractures suggested of the fifth metacarpal shaft. Joints are normally spaced and aligned. No significant arthropathic change. There is soft tissue swelling most evident of the dorsal aspect of the hand. No soft tissue air or radiopaque  foreign body. IMPRESSION: 1. No skeletal abnormality.  No significant arthropathic change. 2. Dorsal soft tissue swelling, nonspecific. Electronically Signed   By: Amie Portland M.D.   On: 11/24/2018 12:47    Procedures Procedures (including critical care time)  Medications Ordered in ED Medications  clindamycin (CLEOCIN) capsule 600 mg (600 mg Oral Given 11/24/18 1348)     Initial Impression / Assessment and Plan / ED Course  I have reviewed the triage vital signs and the nursing notes.  Pertinent labs & imaging results that were available during my care of the patient were reviewed by me and considered in my medical decision making (see chart for details).  Clinical Course as of Nov 23 1436  Sun Nov 24, 2018  5957 48 year old male presents with right hand pain and swelling x2 days, progressively worsening, no injury.  Patient is right-hand dominant.  On exam patient has swelling of his right hand with mild erythema with tenderness over the right fourth metacarpal the dorsal aspect of the hand.  No open wounds, no known injuries.  X-ray shows soft tissue swelling and old bony injury.  CBC within normal limits, BMP unremarkable, sed rate mildly elevated at 20, uric acid mildly elevated 8.7.  CRP within  normal notes.  Patient was given clindamycin while in the emergency room, plan is to discharge home on same, consider cellulitis versus gout or inflammatory process.  Patient to recheck with PCP in 2 days, return to the emergency room for any new or worsening symptoms.   [LM]    Clinical Course User Index [LM] Jeannie Fend, PA-C      Final Clinical Impressions(s) / ED Diagnoses   Final diagnoses:  Cellulitis of hand, right    ED Discharge Orders         Ordered    clindamycin (CLEOCIN) 300 MG capsule  Every 6 hours,   Status:  Discontinued     11/24/18 1422    clindamycin (CLEOCIN) 300 MG capsule  Every 6 hours     11/24/18 1432           Jeannie Fend, PA-C 11/24/18 1438    Gerhard Munch, MD 11/24/18 1549

## 2018-11-24 NOTE — ED Triage Notes (Signed)
C/o R hand pain and swelling x 2 days.  No known injury.

## 2018-11-24 NOTE — ED Notes (Signed)
Patient verbalizes understanding of discharge instructions. Opportunity for questioning and answers were provided. Armband removed by staff, pt discharged from ED.  

## 2018-11-24 NOTE — Discharge Instructions (Addendum)
Take Clindamycin as prescribed and complete the full course. Follow up with your doctor in 2 days. Return to the ER for any new or worsening symptoms.

## 2018-11-24 NOTE — ED Notes (Signed)
Patient transported to X-ray 

## 2018-11-25 MED FILL — CLINDAMYCIN HCL 300 MG CAPS: 300 | 7 days supply | Qty: 28 | Fill #0

## 2018-12-04 ENCOUNTER — Inpatient Hospital Stay: Payer: Self-pay | Admitting: Family Medicine

## 2018-12-17 ENCOUNTER — Ambulatory Visit: Payer: Self-pay | Attending: Family Medicine | Admitting: Family Medicine

## 2018-12-17 ENCOUNTER — Other Ambulatory Visit: Payer: Self-pay

## 2018-12-17 DIAGNOSIS — L299 Pruritus, unspecified: Secondary | ICD-10-CM

## 2018-12-17 DIAGNOSIS — L298 Other pruritus: Secondary | ICD-10-CM

## 2018-12-17 DIAGNOSIS — I10 Essential (primary) hypertension: Secondary | ICD-10-CM

## 2018-12-17 DIAGNOSIS — M10042 Idiopathic gout, left hand: Secondary | ICD-10-CM

## 2018-12-17 DIAGNOSIS — E876 Hypokalemia: Secondary | ICD-10-CM

## 2018-12-17 DIAGNOSIS — F1010 Alcohol abuse, uncomplicated: Secondary | ICD-10-CM

## 2018-12-17 MED ORDER — POTASSIUM CHLORIDE CRYS ER 20 MEQ PO TBCR: 20.0000 meq | EXTENDED_RELEASE_TABLET | Freq: Every day | ORAL | 6 refills | Status: DC

## 2018-12-17 MED ORDER — CARVEDILOL 3.125 MG PO TABS: 3.1250 mg | ORAL_TABLET | Freq: Two times a day (BID) | ORAL | 6 refills | Status: DC

## 2018-12-17 MED ORDER — FOLIC ACID 1 MG PO TABS: 1.0000 mg | ORAL_TABLET | Freq: Every day | ORAL | 3 refills | Status: DC

## 2018-12-17 MED ORDER — HYDROCORTISONE 1 % EX OINT: 1.0000 "application " | TOPICAL_OINTMENT | Freq: Two times a day (BID) | CUTANEOUS | 2 refills | Status: DC

## 2018-12-17 NOTE — Progress Notes (Signed)
Patient verified DOB Patient has taken medication today. Patient has eaten today. Patient denies pain at this time. 

## 2018-12-17 NOTE — Progress Notes (Signed)
Virtual Visit via Telephone Note  I connected with Daniel [name], on 12/17/2018 at 11:01 AM by telephone due to the COVID-19 pandemic and verified that I am speaking with the correct person using two identifiers.   Consent: I discussed the limitations, risks, security and privacy concerns of performing an evaluation and management service by telephone and the availability of in person appointments. I also discussed with the patient that there may be a patient responsible charge related to this service. The patient expressed understanding and agreed to proceed.   Location of Patient: Home  Location of Provider: Clinic   Persons participating in Telemedicine visit: Jasan Doughtie Farrington-CMA Dr. Margarita Rana     History of Present Illness: THESEUS [name] is a 48 year old male with a history of alcohol abuse, hypertension, pulmonary embolism diagnosed  during hospitalization from 03/01/2018 through 03/04/2018. He completed a course of anticoagulation with Eliquis and hypercoagulable panel was negative.   3 weeks ago he had an ED visit for cellulitis of his right hand where he was treated with clindamycin.  Labs ordered returned with elevated uric acid level of 8.7 but he was not placed on any gout medications. Right hand swelling has resolved and he has completed his course of Clindamycin. He drinks a 40oz of beer/day.  With regards to his hypertension he has been compliant with carvedilol last BP was 152/77 measured from the ED. He complains of itching of his nipple for the last month. Endorses  Using OTC anti itch cream.  Denies presence of breast masses or lesions.  Past Medical History:  Diagnosis Date  . Asthma   . Asthma   . Hypertension    No Known Allergies  Current Outpatient Medications on File Prior to Visit  Medication Sig Dispense Refill  . albuterol (VENTOLIN HFA) 108 (90 Base) MCG/ACT inhaler Inhale 1 puff into the lungs every 6 (six) hours as needed for  wheezing or shortness of breath. 18 g 2  . apixaban (ELIQUIS) 5 MG TABS tablet Take 1 tablet (5 mg total) by mouth 2 (two) times daily. (Patient not taking: Reported on 09/17/2018) 60 tablet 1  . benzonatate (TESSALON) 100 MG capsule Take 1 capsule (100 mg total) by mouth 2 (two) times daily. (Patient not taking: Reported on 04/03/2018) 20 capsule 0  . carvedilol (COREG) 3.125 MG tablet Take 1 tablet (3.125 mg total) by mouth 2 (two) times daily with a meal. 60 tablet 6  . dextromethorphan-guaiFENesin (MUCINEX DM) 30-600 MG 12hr tablet Take 1 tablet by mouth 2 (two) times daily as needed for cough. (Patient not taking: Reported on 04/03/2018) 30 tablet 0  . folic acid (FOLVITE) 1 MG tablet Take 1 tablet (1 mg total) by mouth daily. 30 tablet 3  . Multiple Vitamin (MULTIVITAMIN WITH MINERALS) TABS tablet Take 1 tablet by mouth daily. (Patient not taking: Reported on 05/16/2018) 30 tablet 0  . ondansetron (ZOFRAN) 4 MG tablet Take 1 tablet (4 mg total) by mouth every 8 (eight) hours as needed for nausea or vomiting. (Patient not taking: Reported on 09/17/2018) 12 tablet 0  . potassium chloride SA (K-DUR) 20 MEQ tablet Take 1 tablet (20 mEq total) by mouth daily. 30 tablet 6  . thiamine 100 MG tablet Take 1 tablet (100 mg total) by mouth daily. (Patient not taking: Reported on 05/16/2018) 30 tablet 3   No current facility-administered medications on file prior to visit.     Observations/Objective: Awake, alert, oriented x3 Not in acute distress  Assessment  and Plan: 1. Hypertension, unspecified type Uncontrolled from last blood pressure measurement Counseled on compliance with medications Counseled on blood pressure goal of less than 130/80, low-sodium, DASH diet, medication compliance, 150 minutes of moderate intensity exercise per week. Discussed medication compliance, adverse effects. - carvedilol (COREG) 3.125 MG tablet; Take 1 tablet (3.125 mg total) by mouth 2 (two) times daily with a meal.   Dispense: 60 tablet; Refill: 6  2. Alcohol abuse Counseled on hazardous effects of alcohol abuse and need to quit Advised that this could also trigger gout flares He is not ready to quit at this time - folic acid (FOLVITE) 1 MG tablet; Take 1 tablet (1 mg total) by mouth daily.  Dispense: 30 tablet; Refill: 3  3. Hypokalemia - potassium chloride SA (KLOR-CON) 20 MEQ tablet; Take 1 tablet (20 mEq total) by mouth daily.  Dispense: 30 tablet; Refill: 6  4. Acute idiopathic gout of left hand Resolved We have discussed a low purine eating plan Should he have a recurrent flare we will consider placing him on prophylactic medications.  5. Pruritus Unknown etiology Absence of breast masses or lesions Advised he will need an in person visit if symptoms persist - hydrocortisone 1 % ointment; Apply 1 application topically 2 (two) times daily.  Dispense: 30 g; Refill: 2   Follow Up Instructions: Return in about 3 months (around 03/19/2019), or if symptoms worsen or fail to improve.    I discussed the assessment and treatment plan with the patient. The patient was provided an opportunity to ask questions and all were answered. The patient agreed with the plan and demonstrated an understanding of the instructions.   The patient was advised to call back or seek an in-person evaluation if the symptoms worsen or if the condition fails to improve as anticipated.     I provided 20 minutes total of non-face-to-face time during this encounter including median intraservice time, reviewing previous notes, labs, imaging, medications, management and patient verbalized understanding.     Hoy Register, MD, FAAFP. [facility] [place], [place] [phone]   12/17/2018, 11:01 AM

## 2019-02-04 ENCOUNTER — Ambulatory Visit: Payer: HRSA Program | Attending: Internal Medicine

## 2019-02-04 DIAGNOSIS — Z20822 Contact with and (suspected) exposure to covid-19: Secondary | ICD-10-CM | POA: Diagnosis not present

## 2019-02-05 ENCOUNTER — Telehealth: Payer: Self-pay

## 2019-02-05 NOTE — Telephone Encounter (Signed)
Patient called and he was told his COVID-19 test 02/04/19 was still active and had not resulted. He verbalized understanding and will call again.

## 2019-02-06 ENCOUNTER — Telehealth: Payer: Self-pay | Admitting: Family Medicine

## 2019-02-06 LAB — NOVEL CORONAVIRUS, NAA: SARS-CoV-2, NAA: NOT DETECTED

## 2019-02-06 NOTE — Telephone Encounter (Signed)
Patient called in and received his negative covid test result  °

## 2019-02-11 ENCOUNTER — Telehealth: Payer: Self-pay | Admitting: Family Medicine

## 2019-02-11 NOTE — Telephone Encounter (Signed)
Negative COVID results given. Patient results "NOT Detected." Caller expressed understanding. ° °

## 2019-02-21 ENCOUNTER — Ambulatory Visit: Payer: HRSA Program | Attending: Internal Medicine

## 2019-02-21 DIAGNOSIS — Z20822 Contact with and (suspected) exposure to covid-19: Secondary | ICD-10-CM | POA: Diagnosis not present

## 2019-02-22 LAB — NOVEL CORONAVIRUS, NAA: SARS-CoV-2, NAA: NOT DETECTED

## 2019-05-09 ENCOUNTER — Telehealth: Payer: Self-pay | Admitting: Family Medicine

## 2019-05-09 NOTE — Telephone Encounter (Signed)
Patient wanted to inform pcp that he has been having trouble with his R left. Please follow up at your earliest convenience.

## 2019-05-12 NOTE — Telephone Encounter (Signed)
Does patient need an OV or are you familiar with this pain in his leg.

## 2019-05-12 NOTE — Telephone Encounter (Signed)
He needs an office visit.  

## 2019-05-13 NOTE — Telephone Encounter (Signed)
Patient has OV on 05/28/2019 to discuss matter

## 2019-05-28 ENCOUNTER — Ambulatory Visit: Payer: Self-pay | Attending: Family Medicine | Admitting: Family Medicine

## 2019-05-28 ENCOUNTER — Encounter: Payer: Self-pay | Admitting: Family Medicine

## 2019-05-28 ENCOUNTER — Other Ambulatory Visit: Payer: Self-pay

## 2019-05-28 VITALS — BP 143/96 | HR 140 | Ht 68.0 in | Wt 231.4 lb

## 2019-05-28 DIAGNOSIS — M25572 Pain in left ankle and joints of left foot: Secondary | ICD-10-CM

## 2019-05-28 DIAGNOSIS — I10 Essential (primary) hypertension: Secondary | ICD-10-CM

## 2019-05-28 DIAGNOSIS — R600 Localized edema: Secondary | ICD-10-CM

## 2019-05-28 DIAGNOSIS — L299 Pruritus, unspecified: Secondary | ICD-10-CM

## 2019-05-28 DIAGNOSIS — F1010 Alcohol abuse, uncomplicated: Secondary | ICD-10-CM

## 2019-05-28 MED ORDER — HYDROCORTISONE 1 % EX OINT: 1.0000 "application " | TOPICAL_OINTMENT | Freq: Two times a day (BID) | CUTANEOUS | 2 refills | Status: DC

## 2019-05-28 MED ORDER — MELOXICAM 7.5 MG PO TABS: 7.5000 mg | ORAL_TABLET | Freq: Every day | ORAL | 1 refills | Status: DC

## 2019-05-28 MED ORDER — FOLIC ACID 1 MG PO TABS: 1.0000 mg | ORAL_TABLET | Freq: Every day | ORAL | 3 refills | Status: DC

## 2019-05-28 MED ORDER — THIAMINE HCL 100 MG PO TABS: 100.0000 mg | ORAL_TABLET | Freq: Every day | ORAL | 3 refills | Status: DC

## 2019-05-28 MED ORDER — CARVEDILOL 3.125 MG PO TABS: 3.1250 mg | ORAL_TABLET | Freq: Two times a day (BID) | ORAL | 6 refills | Status: DC

## 2019-05-28 MED ORDER — ALBUTEROL SULFATE HFA 108 (90 BASE) MCG/ACT IN AERS: 1.0000 | INHALATION_SPRAY | Freq: Four times a day (QID) | RESPIRATORY_TRACT | 2 refills | Status: DC | PRN

## 2019-05-28 NOTE — Progress Notes (Signed)
Patient states that he is not taking any medication due to him starting drinking again.

## 2019-05-28 NOTE — Progress Notes (Signed)
Subjective:  Patient ID: Daniel [name], male    DOB: 1971/01/02  Age: 49 y.o. MRN: [id number]  CC: Hypertension   HPI Daniel [name] is a 49 year old male with a history of alcohol abuse, hypertension,pulmonary embolism (in 02/2018; completed course of anticoagulation) who presents today with complaints of pain bilateral hip and left ankle pain.   When he gets up in the morning he has a hard time but as he walks around it improves. Pain occurs in in his L ankle and both hips; described as mild at this time. Still has itching of his nipple which has been present for the last 7 months. Itching also occurs in his back and he is unsure if this is related to a soap or cream.  He had been prescribed triamcinolone cream which he has run out of. He started drinking again and has been off his medications hence his elevated blood pressure but informs me he has turned in the relief and is ready to be more compliant.  Past Medical History:  Diagnosis Date  . Asthma   . Asthma   . Hypertension     Past Surgical History:  Procedure Laterality Date  . Dental procedure      Family History  Problem Relation Age of Onset  . Heart attack Mother     No Known Allergies  Outpatient Medications Prior to Visit  Medication Sig Dispense Refill  . albuterol (VENTOLIN HFA) 108 (90 Base) MCG/ACT inhaler Inhale 1 puff into the lungs every 6 (six) hours as needed for wheezing or shortness of breath. (Patient not taking: Reported on 05/28/2019) 18 g 2  . carvedilol (COREG) 3.125 MG tablet Take 1 tablet (3.125 mg total) by mouth 2 (two) times daily with a meal. (Patient not taking: Reported on 05/28/2019) 60 tablet 6  . folic acid (FOLVITE) 1 MG tablet Take 1 tablet (1 mg total) by mouth daily. (Patient not taking: Reported on 05/28/2019) 30 tablet 3  . hydrocortisone 1 % ointment Apply 1 application topically 2 (two) times daily. (Patient not taking: Reported on 05/28/2019) 30 g 2  . Multiple Vitamin (MULTIVITAMIN  WITH MINERALS) TABS tablet Take 1 tablet by mouth daily. (Patient not taking: Reported on 05/16/2018) 30 tablet 0  . potassium chloride SA (KLOR-CON) 20 MEQ tablet Take 1 tablet (20 mEq total) by mouth daily. (Patient not taking: Reported on 05/28/2019) 30 tablet 6  . thiamine 100 MG tablet Take 1 tablet (100 mg total) by mouth daily. (Patient not taking: Reported on 05/16/2018) 30 tablet 3   No facility-administered medications prior to visit.     ROS Review of Systems  Constitutional: Negative for activity change and appetite change.  HENT: Negative for sinus pressure and sore throat.   Eyes: Negative for visual disturbance.  Respiratory: Negative for cough, chest tightness and shortness of breath.   Cardiovascular: Negative for chest pain and leg swelling.  Gastrointestinal: Negative for abdominal distention, abdominal pain, constipation and diarrhea.  Endocrine: Negative.   Genitourinary: Negative for dysuria.  Musculoskeletal:       See HPI  Skin: Negative for rash.  Allergic/Immunologic: Negative.   Neurological: Negative for weakness, light-headedness and numbness.  Psychiatric/Behavioral: Negative for dysphoric mood and suicidal ideas.    Objective:  BP (!) 143/96   Pulse (!) 140   Ht '5\' 8"'  (1.727 m)   Wt 231 lb 6.4 oz (105 kg)   SpO2 95%   BMI 35.18 kg/m   BP/Weight 05/28/2019 11/24/2018 08/29/2017  Systolic BP 599 357 017  Diastolic BP 96 77 793  Wt. (Lbs) 231.4 - 200  BMI 35.18 - 29.53  Some encounter information is confidential and restricted. Go to Review Flowsheets activity to see all data.      Physical Exam Constitutional:      Appearance: He is well-developed.  Neck:     Vascular: No JVD.  Cardiovascular:     Rate and Rhythm: Normal rate.     Heart sounds: Normal heart sounds. No murmur.  Pulmonary:     Effort: Pulmonary effort is normal.     Breath sounds: Normal breath sounds. No wheezing or rales.  Chest:     Chest wall: No tenderness.     Breasts:         Right: No mass, nipple discharge, skin change or tenderness.        Left: No mass, nipple discharge, skin change or tenderness.  Abdominal:     General: Bowel sounds are normal. There is no distension.     Palpations: Abdomen is soft. There is no mass.     Tenderness: There is no abdominal tenderness.  Musculoskeletal:        General: Normal range of motion.     Right lower leg: No edema.     Left lower leg: Edema (1+) present.     Comments: Normal range of motion of both hips, no tenderness Normal range of motion of both ankles, no tenderness  Skin:    Comments: Hypopigmented rash on upper aspect of back  Neurological:     Mental Status: He is alert and oriented to person, place, and time.  Psychiatric:        Mood and Affect: Mood normal.     CMP Latest Ref Rng & Units 11/24/2018 10/18/2018 06/14/2018  Glucose 70 - 99 mg/dL 84 131(H) 108(H)  BUN 6 - 20 mg/dL 5(L) 9 10  Creatinine 0.61 - 1.24 mg/dL 0.97 1.02 0.85  Sodium 135 - 145 mmol/L 137 139 136  Potassium 3.5 - 5.1 mmol/L 3.8 4.4 4.7  Chloride 98 - 111 mmol/L 101 98 99  CO2 22 - 32 mmol/L '24 26 20  ' Calcium 8.9 - 10.3 mg/dL 9.0 9.8 10.3(H)  Total Protein 6.5 - 8.1 g/dL - - -  Total Bilirubin 0.3 - 1.2 mg/dL - - -  Alkaline Phos 38 - 126 U/L - - -  AST 15 - 41 U/L - - -  ALT 0 - 44 U/L - - -    Lipid Panel  No results found for: CHOL, TRIG, HDL, CHOLHDL, VLDL, LDLCALC, LDLDIRECT  CBC    Component Value Date/Time   WBC 10.0 11/24/2018 1242   RBC 4.43 11/24/2018 1242   HGB 14.2 11/24/2018 1242   HCT 41.5 11/24/2018 1242   PLT 320 11/24/2018 1242   MCV 93.7 11/24/2018 1242   MCH 32.1 11/24/2018 1242   MCHC 34.2 11/24/2018 1242   RDW 15.4 11/24/2018 1242   LYMPHSABS 1.7 11/24/2018 1242   MONOABS 0.9 11/24/2018 1242   EOSABS 0.2 11/24/2018 1242   BASOSABS 0.1 11/24/2018 1242    No results found for: HGBA1C  Assessment & Plan:   1. Alcohol abuse Counseled on alcohol cessation - folic acid (FOLVITE) 1  MG tablet; Take 1 tablet (1 mg total) by mouth daily.  Dispense: 30 tablet; Refill: 3 - thiamine 100 MG tablet; Take 1 tablet (100 mg total) by mouth daily.  Dispense: 30 tablet; Refill: 3 - CBC with  Differential/Platelet  2. Essential hypertension Uncontrolled due to running out of medications which I have refilled Counseled on blood pressure goal of less than 130/80, low-sodium, DASH diet, medication compliance, 150 minutes of moderate intensity exercise per week. Discussed medication compliance, adverse effects. - carvedilol (COREG) 3.125 MG tablet; Take 1 tablet (3.125 mg total) by mouth 2 (two) times daily with a meal.  Dispense: 60 tablet; Refill: 6 - CMP14+EGFR  3. Arthralgia of left ankle Could be underlying osteoarthritis in both left ankle hips We will need to exclude gout in ankle Placed on meloxicam - Uric Acid  4. Pedal edema Dependent edema likely We will send a BMP to rule out cardiac etiology - meloxicam (MOBIC) 7.5 MG tablet; Take 1 tablet (7.5 mg total) by mouth daily.  Dispense: 30 tablet; Refill: 1 - Brain natriuretic peptide  5. Pruritus - hydrocortisone 1 % ointment; Apply 1 application topically 2 (two) times daily.  Dispense: 30 g; Refill: 2      Charlott Rakes, MD, FAAFP. [facility] [place], [place]   05/28/2019, 3:14 PM

## 2019-05-28 NOTE — Patient Instructions (Signed)
Pruritus Pruritus is an itchy feeling on the skin. One of the most common causes is dry skin, but many different things can cause itching. Most cases of itching do not require medical attention. Sometimes itchy skin can turn into a rash. Follow these instructions at home: Skin care   Apply moisturizing lotion to your skin as needed. Lotion that contains petroleum jelly is best.  Take medicines or apply medicated creams only as told by your health care provider. This may include: ? Corticosteroid cream. ? Anti-itch lotions. ? Oral antihistamines.  Apply a cool, wet cloth (cool compress) to the affected areas.  Take baths with one of the following: ? Epsom salts. You can get these at your local pharmacy or grocery store. Follow the instructions on the packaging. ? Baking soda. Pour a small amount into the bath as told by your health care provider. ? Colloidal oatmeal. You can get this at your local pharmacy or grocery store. Follow the instructions on the packaging.  Apply baking soda paste to your skin. To make the paste, stir water into a small amount of baking soda until it reaches a paste-like consistency.  Do not scratch your skin.  Do not take hot showers or baths, which can make itching worse. A cool shower may help with itching as long as you apply moisturizing lotion after the shower.  Do not use scented soaps, detergents, perfumes, and cosmetic products. Instead, use gentle, unscented versions of these items. General instructions  Avoid wearing tight clothes.  Keep a journal to help find out what is causing your itching. Write down: ? What you eat and drink. ? What cosmetic products you use. ? What soaps or detergents you use. ? What you wear, including jewelry.  Use a humidifier. This keeps the air moist, which helps to prevent dry skin.  Be aware of any changes in your itchiness. Contact a health care provider if:  The itching does not go away after several  days.  You are unusually thirsty or urinating more than normal.  Your skin tingles or feels numb.  Your skin or the white parts of your eyes turn yellow (jaundice).  You feel weak.  You have any of the following: ? Night sweats. ? Tiredness (fatigue). ? Weight loss. ? Abdominal pain. Summary  Pruritus is an itchy feeling on the skin. One of the most common causes is dry skin, but many different conditions and factors can cause itching.  Apply moisturizing lotion to your skin as needed. Lotion that contains petroleum jelly is best.  Take medicines or apply medicated creams only as told by your health care provider.  Do not take hot showers or baths. Do not use scented soaps, detergents, perfumes, or cosmetic products. This information is not intended to replace advice given to you by your health care provider. Make sure you discuss any questions you have with your health care provider. Document Revised: 01/23/2017 Document Reviewed: 01/23/2017 Elsevier Patient Education  2020 [employer] Inc.  

## 2019-05-29 ENCOUNTER — Telehealth: Payer: Self-pay

## 2019-05-29 ENCOUNTER — Other Ambulatory Visit: Payer: Self-pay | Admitting: Family Medicine

## 2019-05-29 LAB — CMP14+EGFR
ALT: 89 IU/L — ABNORMAL HIGH (ref 0–44)
AST: 118 IU/L — ABNORMAL HIGH (ref 0–40)
Albumin/Globulin Ratio: 1.7 (ref 1.2–2.2)
Albumin: 5.2 g/dL — ABNORMAL HIGH (ref 4.0–5.0)
Alkaline Phosphatase: 102 IU/L (ref 39–117)
BUN/Creatinine Ratio: 11 (ref 9–20)
BUN: 9 mg/dL (ref 6–24)
Bilirubin Total: 0.5 mg/dL (ref 0.0–1.2)
CO2: 19 mmol/L — ABNORMAL LOW (ref 20–29)
Calcium: 9.5 mg/dL (ref 8.7–10.2)
Chloride: 99 mmol/L (ref 96–106)
Creatinine, Ser: 0.85 mg/dL (ref 0.76–1.27)
GFR calc Af Amer: 121 mL/min/{1.73_m2} (ref >59)
GFR calc non Af Amer: 104 mL/min/{1.73_m2} (ref >59)
Globulin, Total: 3 g/dL (ref 1.5–4.5)
Glucose: 74 mg/dL (ref 65–99)
Potassium: 4.4 mmol/L (ref 3.5–5.2)
Sodium: 143 mmol/L (ref 134–144)
Total Protein: 8.2 g/dL (ref 6.0–8.5)

## 2019-05-29 LAB — CBC WITH DIFFERENTIAL/PLATELET
Basophils Absolute: 0.1 10*3/uL (ref 0.0–0.2)
Basos: 1 %
EOS (ABSOLUTE): 0.1 10*3/uL (ref 0.0–0.4)
Eos: 1 %
Hematocrit: 43.2 % (ref 37.5–51.0)
Hemoglobin: 15 g/dL (ref 13.0–17.7)
Immature Grans (Abs): 0 10*3/uL (ref 0.0–0.1)
Immature Granulocytes: 0 %
Lymphocytes Absolute: 1.8 10*3/uL (ref 0.7–3.1)
Lymphs: 23 %
MCH: 33.4 pg — ABNORMAL HIGH (ref 26.6–33.0)
MCHC: 34.7 g/dL (ref 31.5–35.7)
MCV: 96 fL (ref 79–97)
Monocytes Absolute: 0.6 10*3/uL (ref 0.1–0.9)
Monocytes: 8 %
Neutrophils Absolute: 5.3 10*3/uL (ref 1.4–7.0)
Neutrophils: 67 %
Platelets: 306 10*3/uL (ref 150–450)
RBC: 4.49 x10E6/uL (ref 4.14–5.80)
RDW: 15.5 % — ABNORMAL HIGH (ref 11.6–15.4)
WBC: 7.9 10*3/uL (ref 3.4–10.8)

## 2019-05-29 LAB — URIC ACID: Uric Acid: 10.8 mg/dL — ABNORMAL HIGH (ref 3.8–8.4)

## 2019-05-29 LAB — BRAIN NATRIURETIC PEPTIDE: BNP: <2.5 pg/mL (ref 0.0–100.0)

## 2019-05-29 MED ORDER — ALLOPURINOL 300 MG PO TABS: 300.0000 mg | ORAL_TABLET | Freq: Every day | ORAL | 3 refills | Status: DC

## 2019-05-29 MED FILL — $VENTOLIN HFA 18G INHALER: 108 (90 BAS | 25 days supply | Qty: 18 | Fill #0

## 2019-05-29 MED FILL — MELOXICAM 7.5 MG TABLET: 7.5 | 30 days supply | Qty: 30 | Fill #0

## 2019-05-29 MED FILL — FOLIC ACID 1 MG TABS: 1 | 30 days supply | Qty: 30 | Fill #0

## 2019-05-29 MED FILL — ?ALLOPURINOL 300 MG TABLET: 300 | 30 days supply | Qty: 30 | Fill #0

## 2019-05-29 MED FILL — ?CARVEDILOL 3.125 MG TABLET: 3.125 | 30 days supply | Qty: 60 | Fill #0

## 2019-05-29 NOTE — Telephone Encounter (Signed)
Patient name and DOB has been verified Patient was informed of lab results. Patient had no questions.  

## 2019-05-29 NOTE — Telephone Encounter (Signed)
-----   Message from Hoy Register, MD sent at 05/29/2019  2:13 PM EDT ----- Please inform him labs revealed elevated uric acid and a diagnosis of gout. I have sent a prescription for allopurinol to his pharmacy as this could explain the ankle pain and swelling he has been having. Liver enzymes are also elevated from alcohol consumption; please advise him to quit.

## 2019-06-02 ENCOUNTER — Other Ambulatory Visit: Payer: Self-pay

## 2019-06-02 ENCOUNTER — Encounter: Payer: Self-pay | Admitting: Family Medicine

## 2019-06-02 ENCOUNTER — Ambulatory Visit: Payer: Self-pay | Attending: Family Medicine | Admitting: Family Medicine

## 2019-06-02 VITALS — BP 157/92 | HR 126 | Ht 68.0 in | Wt 229.0 lb

## 2019-06-02 DIAGNOSIS — M1A0720 Idiopathic chronic gout, left ankle and foot, without tophus (tophi): Secondary | ICD-10-CM

## 2019-06-02 NOTE — Progress Notes (Signed)
Would like to discuss eating habit so that his gout will not flare up.

## 2019-06-02 NOTE — Patient Instructions (Signed)

## 2019-06-02 NOTE — Progress Notes (Signed)
Subjective:  Patient ID: Daniel [name], male    DOB: 1970-02-15  Age: 49 y.o. MRN: [id number]  CC: Gout   HPI GIAVANNI ODONOVAN  is a 49 year old male with a history of alcohol abuse, hypertension,pulmonary embolism (in 02/2018; completed course of anticoagulation), Gout who presents today requesting education on gout.  He was recently diagnosed with gout after he had presented with ankle pain 5 days ago and was commenced on allopurinol. He feels stiff in his knees when he tries to get up and once he gets walking it improves; prescribed meloxicam at his last visit for this. States he quit alcohol since being diagnosed with Gout. His blood pressure is elevated as he is yet to take his antihypertensive  Past Medical History:  Diagnosis Date  . Asthma   . Asthma   . Hypertension     Past Surgical History:  Procedure Laterality Date  . Dental procedure      Family History  Problem Relation Age of Onset  . Heart attack Mother     No Known Allergies  Outpatient Medications Prior to Visit  Medication Sig Dispense Refill  . albuterol (VENTOLIN HFA) 108 (90 Base) MCG/ACT inhaler Inhale 1 puff into the lungs every 6 (six) hours as needed for wheezing or shortness of breath. 18 g 2  . allopurinol (ZYLOPRIM) 300 MG tablet Take 1 tablet (300 mg total) by mouth daily. 30 tablet 3  . carvedilol (COREG) 3.125 MG tablet Take 1 tablet (3.125 mg total) by mouth 2 (two) times daily with a meal. 60 tablet 6  . folic acid (FOLVITE) 1 MG tablet Take 1 tablet (1 mg total) by mouth daily. 30 tablet 3  . hydrocortisone 1 % ointment Apply 1 application topically 2 (two) times daily. 30 g 2  . meloxicam (MOBIC) 7.5 MG tablet Take 1 tablet (7.5 mg total) by mouth daily. 30 tablet 1  . thiamine 100 MG tablet Take 1 tablet (100 mg total) by mouth daily. 30 tablet 3  . Multiple Vitamin (MULTIVITAMIN WITH MINERALS) TABS tablet Take 1 tablet by mouth daily. (Patient not taking: Reported on 05/16/2018) 30  tablet 0  . potassium chloride SA (KLOR-CON) 20 MEQ tablet Take 1 tablet (20 mEq total) by mouth daily. (Patient not taking: Reported on 05/28/2019) 30 tablet 6   No facility-administered medications prior to visit.     ROS Review of Systems  Constitutional: Negative for activity change and appetite change.  HENT: Negative for sinus pressure and sore throat.   Eyes: Negative for visual disturbance.  Respiratory: Negative for cough, chest tightness and shortness of breath.   Cardiovascular: Negative for chest pain and leg swelling.  Gastrointestinal: Negative for abdominal distention, abdominal pain, constipation and diarrhea.  Endocrine: Negative.   Genitourinary: Negative for dysuria.  Musculoskeletal:       See HPI  Skin: Negative for rash.  Allergic/Immunologic: Negative.   Neurological: Negative for weakness, light-headedness and numbness.  Psychiatric/Behavioral: Negative for dysphoric mood and suicidal ideas.    Objective:  BP (!) 157/92   Pulse (!) 126   Ht 5\' 8"  (1.727 m)   Wt 229 lb (103.9 kg)   SpO2 96%   BMI 34.82 kg/m   BP/Weight 06/02/2019 05/28/2019 11/24/2018  Systolic BP 157 143 152  Diastolic BP 92 96 77  Wt. (Lbs) 229 231.4 -  BMI 34.82 35.18 -  Some encounter information is confidential and restricted. Go to Review Flowsheets activity to see all data.  Physical Exam Constitutional:      Appearance: He is well-developed.  Neck:     Vascular: No JVD.  Cardiovascular:     Rate and Rhythm: Tachycardia present.     Heart sounds: Normal heart sounds. No murmur.  Pulmonary:     Effort: Pulmonary effort is normal.     Breath sounds: Normal breath sounds. No wheezing or rales.  Chest:     Chest wall: No tenderness.  Abdominal:     General: Bowel sounds are normal. There is no distension.     Palpations: Abdomen is soft. There is no mass.     Tenderness: There is no abdominal tenderness.  Musculoskeletal:        General: No tenderness. Normal range  of motion.     Right lower leg: No edema.     Left lower leg: No edema.  Neurological:     Mental Status: He is alert and oriented to person, place, and time.  Psychiatric:        Mood and Affect: Mood normal.     CMP Latest Ref Rng & Units 05/28/2019 11/24/2018 10/18/2018  Glucose 65 - 99 mg/dL 74 84 131(H)  BUN 6 - 24 mg/dL 9 5(L) 9  Creatinine 0.76 - 1.27 mg/dL 0.85 0.97 1.02  Sodium 134 - 144 mmol/L 143 137 139  Potassium 3.5 - 5.2 mmol/L 4.4 3.8 4.4  Chloride 96 - 106 mmol/L 99 101 98  CO2 20 - 29 mmol/L 19(L) 24 26  Calcium 8.7 - 10.2 mg/dL 9.5 9.0 9.8  Total Protein 6.0 - 8.5 g/dL 8.2 - -  Total Bilirubin 0.0 - 1.2 mg/dL 0.5 - -  Alkaline Phos 39 - 117 IU/L 102 - -  AST 0 - 40 IU/L 118(H) - -  ALT 0 - 44 IU/L 89(H) - -    Lipid Panel  No results found for: CHOL, TRIG, HDL, CHOLHDL, VLDL, LDLCALC, LDLDIRECT  CBC    Component Value Date/Time   WBC 7.9 05/28/2019 1622   WBC 10.0 11/24/2018 1242   RBC 4.49 05/28/2019 1622   RBC 4.43 11/24/2018 1242   HGB 15.0 05/28/2019 1622   HCT 43.2 05/28/2019 1622   PLT 306 05/28/2019 1622   MCV 96 05/28/2019 1622   MCH 33.4 (H) 05/28/2019 1622   MCH 32.1 11/24/2018 1242   MCHC 34.7 05/28/2019 1622   MCHC 34.2 11/24/2018 1242   RDW 15.5 (H) 05/28/2019 1622   LYMPHSABS 1.8 05/28/2019 1622   MONOABS 0.9 11/24/2018 1242   EOSABS 0.1 05/28/2019 1622   BASOSABS 0.1 05/28/2019 1622    No results found for: HGBA1C  Assessment & Plan:  1. Idiopathic chronic gout of left ankle without tophus Doing well on allopurinol Commended on quitting alcohol Provided education regarding plan Continue meloxicam for osteoarthritis       Charlott Rakes, MD, FAAFP. [facility] and [place], [place]   06/02/2019, 11:31 AM

## 2019-06-24 MED FILL — ?ALLOPURINOL 300 MG TABLET: 300 | 30 days supply | Qty: 30 | Fill #1

## 2019-06-24 MED FILL — ?CARVEDILOL 3.125 MG TABLET: 3.125 | 30 days supply | Qty: 60 | Fill #1

## 2019-06-24 MED FILL — FOLIC ACID 1 MG TABS: 1 | 30 days supply | Qty: 30 | Fill #1

## 2019-06-24 MED FILL — MELOXICAM 7.5 MG TABLET: 7.5 | 30 days supply | Qty: 30 | Fill #1

## 2019-07-29 ENCOUNTER — Other Ambulatory Visit: Payer: Self-pay | Admitting: Family Medicine

## 2019-07-29 DIAGNOSIS — R600 Localized edema: Secondary | ICD-10-CM

## 2019-07-29 MED FILL — MELOXICAM 7.5 MG TABLET: 7.5 | 30 days supply | Qty: 30 | Fill #0

## 2019-07-29 MED FILL — FOLIC ACID 1 MG TABS: 1 | 30 days supply | Qty: 30 | Fill #2

## 2019-07-29 MED FILL — ?CARVEDILOL 3.125 MG TABLET: 3.125 | 30 days supply | Qty: 60 | Fill #2

## 2019-07-29 MED FILL — ?ALLOPURINOL 300 MG TABLET: 300 | 30 days supply | Qty: 30 | Fill #2

## 2019-08-13 ENCOUNTER — Encounter (HOSPITAL_COMMUNITY): Payer: Self-pay

## 2019-08-13 ENCOUNTER — Ambulatory Visit (HOSPITAL_COMMUNITY)
Admission: EM | Admit: 2019-08-13 | Discharge: 2019-08-13 | Disposition: A | Discharge status: Home / Self Care | Payer: Self-pay | Attending: Family Medicine | Admitting: Family Medicine

## 2019-08-13 ENCOUNTER — Other Ambulatory Visit: Payer: Self-pay

## 2019-08-13 DIAGNOSIS — G5602 Carpal tunnel syndrome, left upper limb: Secondary | ICD-10-CM

## 2019-08-13 DIAGNOSIS — M7989 Other specified soft tissue disorders: Secondary | ICD-10-CM

## 2019-08-13 LAB — POCT URINALYSIS DIP (DEVICE)
Bilirubin Urine: NEGATIVE
Glucose, UA: NEGATIVE mg/dL
Hgb urine dipstick: NEGATIVE
Ketones, ur: NEGATIVE mg/dL
Leukocytes,Ua: NEGATIVE
Nitrite: NEGATIVE
Protein, ur: NEGATIVE mg/dL
Specific Gravity, Urine: >=1.03 (ref 1.005–1.030)
Urobilinogen, UA: 0.2 mg/dL (ref 0.0–1.0)
pH: 5.5 (ref 5.0–8.0)

## 2019-08-13 MED ORDER — PREDNISONE 10 MG (21) PO TBPK: ORAL_TABLET | Freq: Every day | ORAL | 0 refills | Status: DC

## 2019-08-13 MED FILL — predniSONE 10 MG TABS: 10 | 6 days supply | Qty: 21 | Fill #0

## 2019-08-13 NOTE — Discharge Instructions (Signed)
I do think the repetitive nature of your work is causing much of your symptoms. Arthritis likely to right hand and carpal tunnel likely to left.  We will place braces to both wrists to aid with symptoms. Wear at all times,even at night.  Ice and elevation to the wrists after activity. Prednisone pack.  Please follow up with your PCP for BP recheck.  Follow up with orthopedics if your hand pain persists as you may need additional treatment.

## 2019-08-13 NOTE — ED Provider Notes (Signed)
[facility]    CSN: [id number] Arrival date & time: 08/13/19  1039      History   Chief Complaint Chief Complaint  Patient presents with  . bilat hand swelling    HPI Daniel [name] is a 49 y.o. male.   Daniel [name] presents with complaints of right hand swelling which he woke with this morning. Has improved some, he feels like movement and activity helps with swelling. Left hand with mild swelling. He has been experiencing tingling to left hand, worse when he is driving. Has noted it for the past three days. History of gout, he is unable to determine if this feels similar. Pain to pointer- ring finger MCP joints without specific swelling or redness to this area. No numbness or tingling to fingers. No specific injury to the hands. States he has had similar in the past and had a carpal tunnel diagnosis in the past. Has taken tylenol which hasn't helped. 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 He is right handed.    ROS per HPI, negative if not otherwise mentioned.      Past Medical History:  Diagnosis Date  . Asthma   . Asthma   . Hypertension     Patient Active Problem List   Diagnosis Date Noted  . Hypomagnesemia 03/03/2018  . HTN (hypertension) 03/02/2018  . Alcoholic hepatitis without ascites 03/02/2018  . Chronic anemia 03/02/2018  . Alcohol abuse 03/01/2018  . Tobacco abuse 03/01/2018  . PE (pulmonary thromboembolism) (HCC) 03/01/2018  . Acute respiratory failure with hypoxia (HCC) 03/01/2018  . Thrombocytopenia (HCC) 03/01/2018  . Asthma 03/01/2018  . Exacerbation of asthma   . Alcohol dependence (HCC) 09/05/2012  . 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 Dental procedure         Home Medications    Prior to Admission medications   Medication Sig Start Date End Date Taking? Authorizing Provider  albuterol (VENTOLIN  HFA) 108 (90 Base) MCG/ACT inhaler Inhale 1 puff into the lungs every 6 (six) hours as needed for wheezing or shortness of breath. 05/28/19   Hoy Register, MD  allopurinol (ZYLOPRIM) 300 MG tablet Take 1 tablet (300 mg total) by mouth daily. 05/29/19   Hoy Register, MD  carvedilol (COREG) 3.125 MG tablet Take 1 tablet (3.125 mg total) by mouth 2 (two) times daily with a meal. 05/28/19   Hoy Register, MD  folic acid (FOLVITE) 1 MG tablet Take 1 tablet (1 mg total) by mouth daily. 05/28/19   Hoy Register, MD  hydrocortisone 1 % ointment Apply 1 application topically 2 (two) times daily. 05/28/19   Hoy Register, MD  meloxicam (MOBIC) 7.5 MG tablet TAKE 1 TABLET (7.5 MG TOTAL) BY MOUTH DAILY. 07/29/19   Hoy Register, MD  Multiple Vitamin (MULTIVITAMIN WITH MINERALS) TABS tablet Take 1 tablet by mouth daily. Patient not taking: Reported on 05/16/2018 03/05/18   Roberto Scales D, MD  potassium chloride SA (KLOR-CON) 20 MEQ tablet Take 1 tablet (20 mEq total) by mouth daily. Patient not taking: Reported on 05/28/2019 12/17/18   Hoy Register, MD  predniSONE (STERAPRED UNI-PAK 21 TAB) 10 MG (21) TBPK tablet Take by mouth daily. Per box instruction 08/13/19   Linus Mako B, NP  thiamine 100 MG tablet Take 1 tablet (100 mg total) by mouth daily. 05/28/19   Hoy Register, MD    Family History  Family History  Problem Relation Age of Onset  . Heart attack Mother     Social History Social History   Tobacco Use  . Smoking status: Current Some Day Smoker    Packs/day: 0.50    Types: Cigarettes  . Smokeless tobacco: Never Used  Vaping Use  . Vaping Use: Never used  Substance Use Topics  . Alcohol use: Not Currently    Comment: occasional  . Drug use: Not Currently     Allergies   Patient has no known allergies.   Review of Systems Review of Systems   Physical Exam Triage Vital Signs ED Triage Vitals  Enc Vitals Group     BP 08/13/19 1109 (!) 179/106     Pulse Rate 08/13/19 1109  96     Resp 08/13/19 1109 18     Temp 08/13/19 1109 98.9 F (37.2 C)     Temp Source 08/13/19 1109 Oral     SpO2 08/13/19 1109 98 %     Weight 08/13/19 1110 220 lb (99.8 kg)     Height 08/13/19 1110 5\' 8"  (1.727 m)     Head Circumference --      Peak Flow --      Pain Score 08/13/19 1109 7     Pain Loc --      Pain Edu? --      Excl. in GC? --    No data found.  Updated Vital Signs BP (!) 179/106   Pulse 96   Temp 98.9 F (37.2 C) (Oral)   Resp 18   Ht 5\' 8"  (1.727 m)   Wt 220 lb (99.8 kg)   SpO2 98%   BMI 33.45 kg/m    Physical Exam Constitutional:      Appearance: He is well-developed.  Cardiovascular:     Rate and Rhythm: Normal rate.  Pulmonary:     Effort: Pulmonary effort is normal.  Musculoskeletal:     Right hand: Swelling present. Normal strength. Normal sensation. Normal capillary refill. Normal pulse.     Left hand: No tenderness. Normal strength. Normal sensation. Normal capillary refill. Normal pulse.     Comments: Generalized swelling noted to right hand with mild decreased flexion due to swelling; no specific point tenderness, no redness or warmth; left hand with only trace swelling; positive phalen's and tinel although tingling/ pricking sensation is to pointer-ring fingers on left hand, no thumb involvement   Skin:    General: Skin is warm and dry.  Neurological:     Mental Status: He is alert and oriented to person, place, and time.      UC Treatments / Results  Labs (all labs ordered are listed, but only abnormal results are displayed) Labs Reviewed  POCT URINALYSIS DIP (DEVICE)    EKG   Radiology No results found.  Procedures Procedures (including critical care time)  Medications Ordered in UC Medications - No data to display  Initial Impression / Assessment and Plan / UC Course  I have reviewed the triage vital signs and the nursing notes.  Pertinent labs & imaging results that were available during my care of the patient  were reviewed by me and considered in my medical decision making (see chart for details).     Arthritis to right hand seems likely, swelling improving, no injury, no redness or warmth and less pain than would expect with gouty arthritis. Carpal tunnel to the left hand/wrist. Braces provided bilaterally. Steroids provided. Urine is completely normal, no indications of  nephrotic syndrome. No lower extremity edema. Noted hypertension. Follow up recommendations discussed. Patient verbalized understanding and agreeable to plan.   Final Clinical Impressions(s) / UC Diagnoses   Final diagnoses:  Swelling of right hand  Carpal tunnel syndrome of left wrist     Discharge Instructions     I do think the repetitive nature of your work is causing much of your symptoms. Arthritis likely to right hand and carpal tunnel likely to left.  We will place braces to both wrists to aid with symptoms. Wear at all times,even at night.  Ice and elevation to the wrists after activity. Prednisone pack.  Please follow up with your PCP for BP recheck.  Follow up with orthopedics if your hand pain persists as you may need additional treatment.    ED Prescriptions    Medication Sig Dispense Auth. Provider   predniSONE (STERAPRED UNI-PAK 21 TAB) 10 MG (21) TBPK tablet Take by mouth daily. Per box instruction 21 tablet Georgetta Haber, NP     PDMP not reviewed this encounter.   Georgetta Haber, NP 08/13/19 [phone]

## 2019-08-13 NOTE — ED Notes (Signed)
Notified Natalie, NP of pt's bp

## 2019-08-13 NOTE — ED Triage Notes (Signed)
Pt states when he woke up this morning his hands were swollen bilat. Pt states his left arm starts tingling any time his left hand is elevated or down to his side. Pt has 2+ edema of right hand, 1+ edema of left hand. Pt unable to make a complete fist with right hand due to edema.

## 2019-08-29 MED FILL — $VENTOLIN HFA 18G INHALER: 108 (90 BAS | 25 days supply | Qty: 18 | Fill #1

## 2019-08-29 MED FILL — FOLIC ACID 1 MG TABS: 1 | 30 days supply | Qty: 30 | Fill #3

## 2019-08-29 MED FILL — ALLOPURINOL 300 MG TAB: 300 | 30 days supply | Qty: 30 | Fill #3

## 2019-08-29 MED FILL — CARVEDILOL 3.125 MG TABLET: 3.125 | 30 days supply | Qty: 60 | Fill #3

## 2019-08-29 MED FILL — MELOXICAM 7.5 MG TABLET: 7.5 | 30 days supply | Qty: 30 | Fill #1

## 2019-09-30 ENCOUNTER — Telehealth: Payer: Self-pay | Admitting: Family Medicine

## 2019-09-30 ENCOUNTER — Other Ambulatory Visit: Payer: Self-pay

## 2019-09-30 ENCOUNTER — Ambulatory Visit (HOSPITAL_COMMUNITY)
Admission: EM | Admit: 2019-09-30 | Discharge: 2019-09-30 | Disposition: A | Discharge status: Home / Self Care | Payer: Self-pay | Attending: Family Medicine | Admitting: Family Medicine

## 2019-09-30 ENCOUNTER — Ambulatory Visit: Payer: Self-pay | Attending: Family Medicine | Admitting: Family Medicine

## 2019-09-30 ENCOUNTER — Other Ambulatory Visit: Payer: Self-pay | Admitting: Family Medicine

## 2019-09-30 ENCOUNTER — Encounter: Payer: Self-pay | Admitting: Family Medicine

## 2019-09-30 ENCOUNTER — Encounter (HOSPITAL_COMMUNITY): Payer: Self-pay

## 2019-09-30 DIAGNOSIS — F1010 Alcohol abuse, uncomplicated: Secondary | ICD-10-CM

## 2019-09-30 DIAGNOSIS — M1A00X0 Idiopathic chronic gout, unspecified site, without tophus (tophi): Secondary | ICD-10-CM

## 2019-09-30 DIAGNOSIS — M722 Plantar fascial fibromatosis: Secondary | ICD-10-CM

## 2019-09-30 DIAGNOSIS — M79672 Pain in left foot: Secondary | ICD-10-CM

## 2019-09-30 MED ORDER — FOLIC ACID 1 MG PO TABS: 1.0000 mg | ORAL_TABLET | Freq: Every day | ORAL | 3 refills | Status: DC

## 2019-09-30 MED ORDER — PREDNISONE 10 MG (21) PO TBPK: ORAL_TABLET | Freq: Every day | ORAL | 0 refills | Status: AC

## 2019-09-30 MED ORDER — ALLOPURINOL 300 MG PO TABS: 300.0000 mg | ORAL_TABLET | Freq: Two times a day (BID) | ORAL | 3 refills | Status: DC

## 2019-09-30 MED ORDER — MELOXICAM 7.5 MG PO TABS: 7.5000 mg | ORAL_TABLET | Freq: Every day | ORAL | 0 refills | Status: DC

## 2019-09-30 MED ORDER — COLCHICINE 0.6 MG PO TABS: ORAL_TABLET | ORAL | 1 refills | Status: DC

## 2019-09-30 MED FILL — FOLIC ACID 1 MG TABS: 1 | 30 days supply | Qty: 30 | Fill #0

## 2019-09-30 MED FILL — predniSONE 10 MG TABS: 10 | 6 days supply | Qty: 21 | Fill #0

## 2019-09-30 MED FILL — MELOXICAM 7.5 MG TABLET: 7.5 | 30 days supply | Qty: 30 | Fill #0

## 2019-09-30 MED FILL — ALLOPURINOL 300 MG TAB: 300 | 30 days supply | Qty: 60 | Fill #0

## 2019-09-30 MED FILL — COLCHICINE 0.6 MG TABS: 0.6 | 15 days supply | Qty: 30 | Fill #0

## 2019-09-30 NOTE — Telephone Encounter (Signed)
Copied from CRM [id number]. Topic: General - Other >> Sep 30, 2019  9:00 AM Tamela Oddi wrote: Reason for CRM: Patient called to ask if he can see the doctor instead of having a virtual appt. Because he is having pain from his knee cap to his hip.  He does not want to go to an urgent care.  Please call patient to confirm if he can come into the office today.  CB# [phone]

## 2019-09-30 NOTE — Telephone Encounter (Signed)
Patient was called, verified, and was informed of how the telephone visit would go. Patient at first expressed concerns due to the appointment being over the phone, once rep explained the concept of the virtual visit the patient then stated that as long as the problem gets taken care of is all that matters. Patient stated that he would wait for the call from pcp and go from there if he feels like he needs to be seen at urgent care. Patient had no further questions or concerns at this time.

## 2019-09-30 NOTE — ED Triage Notes (Signed)
Pt is here with on & off left leg pain that started a week ago, pt has taken Tylenol to relieve discomfort.

## 2019-09-30 NOTE — ED Provider Notes (Signed)
[facility]   [id number] 09/30/19 Arrival Time: 1036  OI:NOMVE PAIN  SUBJECTIVE: History from: patient . Daniel [name] is a 49 y.o. male complains of left leg pain that originates in the sole of the L foot. Has taken tylenol with temporary relief. Denies a precipitating event or specific injury. Symptoms are made worse with activity.  Denies similar symptoms in the past.  Denies fever, chills, erythema, ecchymosis, effusion, weakness, numbness and tingling, saddle paresthesias, loss of bowel or bladder function.      ROS: As per HPI.  All other pertinent ROS negative.     Past Medical History:  Diagnosis Date  . Asthma   . Asthma   . Hypertension    Past Surgical History:  Procedure Laterality Date  . Dental procedure     No Known Allergies No current facility-administered medications on file prior to encounter.   Current Outpatient Medications on File Prior to Encounter  Medication Sig Dispense Refill  . albuterol (VENTOLIN HFA) 108 (90 Base) MCG/ACT inhaler Inhale 1 puff into the lungs every 6 (six) hours as needed for wheezing or shortness of breath. 18 g 2  . allopurinol (ZYLOPRIM) 300 MG tablet Take 1 tablet (300 mg total) by mouth daily. 30 tablet 3  . carvedilol (COREG) 3.125 MG tablet Take 1 tablet (3.125 mg total) by mouth 2 (two) times daily with a meal. 60 tablet 6  . folic acid (FOLVITE) 1 MG tablet Take 1 tablet (1 mg total) by mouth daily. 30 tablet 3  . hydrocortisone 1 % ointment Apply 1 application topically 2 (two) times daily. 30 g 2  . Multiple Vitamin (MULTIVITAMIN WITH MINERALS) TABS tablet Take 1 tablet by mouth daily. (Patient not taking: Reported on 05/16/2018) 30 tablet 0  . potassium chloride SA (KLOR-CON) 20 MEQ tablet Take 1 tablet (20 mEq total) by mouth daily. (Patient not taking: Reported on 05/28/2019) 30 tablet 6  . thiamine 100 MG tablet Take 1 tablet (100 mg total) by mouth daily. 30 tablet 3   Social History   Socioeconomic History    . Marital status: Divorced    Spouse name: Not on file  . Number of children: Not on file  . Years of education: Not on file  . Highest education level: Not on file  Occupational History  . Not on file  Tobacco Use  . Smoking status: Current Some Day Smoker    Packs/day: 0.50    Types: Cigarettes  . Smokeless tobacco: Never Used  Vaping Use  . Vaping Use: Never used  Substance and Sexual Activity  . Alcohol use: Not Currently  . Drug use: Not Currently  . Sexual activity: Yes    Birth control/protection: None    Comment: Married  Other Topics Concern  . Not on file  Social History Narrative   ** Merged History Encounter **       Social Determinants of Health   Financial Resource Strain:   . Difficulty of Paying Living Expenses: Not on file  Food Insecurity:   . Worried About Programme researcher, broadcasting/film/video in the Last Year: Not on file  . Ran Out of Food in the Last Year: Not on file  Transportation Needs:   . Lack of Transportation (Medical): Not on file  . Lack of Transportation (Non-Medical): Not on file  Physical Activity:   . Days of Exercise per Week: Not on file  . Minutes of Exercise per Session: Not on file  Stress:   .  Feeling of Stress : Not on file  Social Connections:   . Frequency of Communication with Friends and Family: Not on file  . Frequency of Social Gatherings with Friends and Family: Not on file  . Attends Religious Services: Not on file  . Active Member of Clubs or Organizations: Not on file  . Attends Banker Meetings: Not on file  . Marital Status: Not on file  Intimate Partner Violence:   . Fear of Current or Ex-Partner: Not on file  . Emotionally Abused: Not on file  . Physically Abused: Not on file  . Sexually Abused: Not on file   Family History  Problem Relation Age of Onset  . Heart attack Mother     OBJECTIVE:  Vitals:   09/30/19 1232  BP: (!) 183/123  Pulse: (!) 107  Resp: 20  Temp: 97.8 F (36.6 C)  TempSrc: Oral   SpO2: 98%    General appearance: ALERT; in no acute distress.  Head: NCAT Lungs: Normal respiratory effort CV: pulses 2+ bilaterally. Cap refill < 2 seconds Musculoskeletal:  Inspection: Skin warm, dry, clear and intact without obvious erythema, effusion, or ecchymosis.  Palpation: L sole of foot tender to palpation ROM: FROM active and passive Skin: warm and dry Neurologic: Ambulates without difficulty; Sensation intact about the upper/ lower extremities Psychological: alert and cooperative; normal mood and affect  DIAGNOSTIC STUDIES:  No results found.   ASSESSMENT & PLAN:  1. Plantar fasciitis of left foot   2. Left foot pain      Meds ordered this encounter  Medications  . predniSONE (STERAPRED UNI-PAK 21 TAB) 10 MG (21) TBPK tablet    Sig: Take by mouth daily for 6 days. Take 6 tablets on day 1, 5 tablets on day 2, 4 tablets on day 3, 3 tablets on day 4, 2 tablets on day 5, 1 tablet on day 6    Dispense:  21 tablet    Refill:  0    Order Specific Question:   Supervising Provider    Answer:   Merrilee Jansky [id number]  . meloxicam (MOBIC) 7.5 MG tablet    Sig: Take 1 tablet (7.5 mg total) by mouth daily.    Dispense:  30 tablet    Refill:  0    Order Specific Question:   Supervising Provider    Answer:   Merrilee Jansky [id number]   Prednisone taper prescribed Meloxicam prescribed Do not take ibuprofen or aleve with this medication Continue conservative management of rest, ice, and gentle stretches Take ibuprofen as needed for pain relief (may cause abdominal discomfort, ulcers, and GI bleeds avoid taking with other NSAIDs)  Return or go to the ER if you have any new or worsening symptoms (fever, chills, chest pain, abdominal pain, changes in bowel or bladder habits, pain radiating into lower legs)   Reviewed expectations re: course of current medical issues. Questions answered. Outlined signs and symptoms indicating need for more acute intervention. Patient  verbalized understanding. After Visit Summary given.       Moshe Cipro, NP 09/30/19 1306

## 2019-09-30 NOTE — Discharge Instructions (Signed)
Take ibuprofen as needed.  Rest and elevate your leg. Apply ice packs 2-3 times a day for up to 20 minutes each.     Follow up with your primary care provider or an orthopedist if you symptoms continue or worsen;  Or if you develop new symptoms, such as numbness, tingling, or weakness.     

## 2019-09-30 NOTE — Progress Notes (Signed)
Urgent care follow up for hand pain.  Went to urgent care this morning for left leg pain.

## 2019-09-30 NOTE — Progress Notes (Signed)
Virtual Visit via Telephone Note  I connected with [name], on 09/30/2019 at 2:40 PM by telephone due to the COVID-19 pandemic and verified that I am speaking with the correct person using two identifiers.   Consent: I discussed the limitations, risks, security and privacy concerns of performing an evaluation and management service by telephone and the availability of in person appointments. I also discussed with the patient that there may be a patient responsible charge related to this service. The patient expressed understanding and agreed to proceed.   Location of Patient: Home  Location of Provider: Clinic   Persons participating in Telemedicine visit: [place] Romey Farrington-CMA Dr. Alvis Lemmings     History of Present Illness: Daniel [name] is a 49 year old male with a history of alcohol abuse, hypertension,pulmonary embolism(in 02/2018;completed course of anticoagulation),    Seen on 08/13/19 for bilateral hand pain thought to be secondary to Carpal Tunnel syndrome of the L hand and R Gouty Arthritis. He has swelling on the dorsum of both hands and he is unable to make a fist and also has tingling. He has been compliant with Allopurinol and has cut out alcohol. Also seen at Urgent Care this morning for L foot plantar fasciitis and prescribed a Medrol dose pack and Meloxicam.  Past Medical History:  Diagnosis Date  . Asthma   . Asthma   . Hypertension    No Known Allergies  Current Outpatient Medications on File Prior to Visit  Medication Sig Dispense Refill  . albuterol (VENTOLIN HFA) 108 (90 Base) MCG/ACT inhaler Inhale 1 puff into the lungs every 6 (six) hours as needed for wheezing or shortness of breath. 18 g 2  . allopurinol (ZYLOPRIM) 300 MG tablet Take 1 tablet (300 mg total) by mouth daily. 30 tablet 3  . carvedilol (COREG) 3.125 MG tablet Take 1 tablet (3.125 mg total) by mouth 2 (two) times daily with a meal. 60 tablet 6  . folic acid  (FOLVITE) 1 MG tablet Take 1 tablet (1 mg total) by mouth daily. 30 tablet 3  . hydrocortisone 1 % ointment Apply 1 application topically 2 (two) times daily. 30 g 2  . thiamine 100 MG tablet Take 1 tablet (100 mg total) by mouth daily. 30 tablet 3  . Multiple Vitamin (MULTIVITAMIN WITH MINERALS) TABS tablet Take 1 tablet by mouth daily. (Patient not taking: Reported on 05/16/2018) 30 tablet 0  . potassium chloride SA (KLOR-CON) 20 MEQ tablet Take 1 tablet (20 mEq total) by mouth daily. (Patient not taking: Reported on 05/28/2019) 30 tablet 6   No current facility-administered medications on file prior to visit.    Observations/Objective: Awake, alert, oriented x3 Not in acute distress  Assessment and Plan: 1. Idiopathic chronic gout without tophus, unspecified site Currently with acute flare Commenced on Colchicine He does have a Medrol dose pack from UC which should help with this Increased Allopurinol dose for prophylaxis Advised to abstain from foods that trigger Gout - allopurinol (ZYLOPRIM) 300 MG tablet; Take 1 tablet (300 mg total) by mouth 2 (two) times daily.  Dispense: 60 tablet; Refill: 3 - colchicine 0.6 MG tablet; Take 2 tabs (1.2mg ) at the onset of a Gout flare, may repeat 1 tab (0.6mg ) after 2 hours if symptoms persist  Dispense: 30 tablet; Refill: 1  2. Alcohol abuse He endorses quitting alcohol consumption - folic acid (FOLVITE) 1 MG tablet; Take 1 tablet (1 mg total) by mouth daily.  Dispense: 30 tablet; Refill: 3  Follow Up Instructions: 3 months for chronic disease management   I discussed the assessment and treatment plan with the patient. The patient was provided an opportunity to ask questions and all were answered. The patient agreed with the plan and demonstrated an understanding of the instructions.   The patient was advised to call back or seek an in-person evaluation if the symptoms worsen or if the condition fails to improve as anticipated.     I  provided 14 minutes total of non-face-to-face time during this encounter including median intraservice time, reviewing previous notes, investigations, ordering medications, medical decision making, coordinating care and patient verbalized understanding at the end of the visit.     Hoy Register, MD, FAAFP. [facility] [place], [place] [phone]   09/30/2019, 2:40 PM

## 2019-10-01 MED FILL — CARVEDILOL 3.125 MG TABLET: 3.125 | 30 days supply | Qty: 60 | Fill #4

## 2019-10-21 ENCOUNTER — Ambulatory Visit (INDEPENDENT_AMBULATORY_CARE_PROVIDER_SITE_OTHER): Payer: Self-pay | Admitting: Orthopaedic Surgery

## 2019-10-21 ENCOUNTER — Ambulatory Visit: Payer: Self-pay

## 2019-10-21 ENCOUNTER — Encounter: Payer: Self-pay | Admitting: Physician Assistant

## 2019-10-21 DIAGNOSIS — M25552 Pain in left hip: Secondary | ICD-10-CM

## 2019-10-21 DIAGNOSIS — M545 Low back pain, unspecified: Secondary | ICD-10-CM

## 2019-10-21 DIAGNOSIS — M25551 Pain in right hip: Secondary | ICD-10-CM

## 2019-10-21 MED ORDER — TRAMADOL HCL 50 MG PO TABS: 50.0000 mg | ORAL_TABLET | Freq: Four times a day (QID) | ORAL | 0 refills | Status: DC | PRN

## 2019-10-21 MED FILL — traMADol HCL 50 MG TABS: 50 | 7 days supply | Qty: 30 | Fill #0

## 2019-10-21 NOTE — Progress Notes (Signed)
Subjective: Patient is here for ultrasound-guided intra-articular bilateral hip injection.   Pain from DJD.  Objective:  Pain with passive IR.  Procedure: Ultrasound-guided bilateral hip injection: After sterile prep with Betadine, injected 8 cc 1% lidocaine without epinephrine and 40 mg methylprednisolone using a 22-gauge spinal needle, passing the needle through the iliofemoral ligament into the femoral head/neck junction.  Injectate seen filling both capsules.  Good immediate relief.

## 2019-10-21 NOTE — Progress Notes (Signed)
Office Visit Note   Patient: Daniel [name]           Date of Birth: 12/30/1970           MRN: [id number] Visit Date: 10/21/2019              Requested by: Hoy Register, MD [address] [place],  [place] [address] PCP: Hoy Register, MD   Assessment & Plan: Visit Diagnoses:  1. Bilateral hip pain   2. Low back pain, unspecified back pain laterality, unspecified chronicity, unspecified whether sciatica present     Plan: Impression is advanced degenerative joint disease both hips.  I believe the patient symptoms are primarily coming from his hips but I think walking with an altered gait is aggravating his back.  We will now refer the patient to Dr. Prince Rome for ultrasound-guided cortisone injections of both hips.  He will follow up with [place] as needed.  Follow-Up Instructions: Return if symptoms worsen or fail to improve.   Orders:  Orders Placed This Encounter  Procedures  . XR HIPS BILAT W OR W/O PELVIS 3-4 VIEWS  . XR Lumbar Spine 2-3 Views   Meds ordered this encounter  Medications  . traMADol (ULTRAM) 50 MG tablet    Sig: Take 1 tablet (50 mg total) by mouth every 6 (six) hours as needed.    Dispense:  30 tablet    Refill:  0      Procedures: No procedures performed   Clinical Data: No additional findings.   Subjective: Chief Complaint  Patient presents with  . Left Hip - Pain  . Right Hip - Pain    HPI patient is a very pleasant 49 year old gentleman who comes in today with bilateral hip pain both equally as bad.  He has pain primarily to the groin and the top of the thigh but also notes pain from the buttocks and into the lateral thighs to the knees as well.  He has occasional weakness to both legs.  He has increased pain when standing for long period of time.  No numbness, tingling or burning to either feet.  No bowel or bladder dysfunction.  No previous cortisone injections to either hip.  Review of Systems as detailed in HPI.  All others reviewed  and are negative.   Objective: Vital Signs: There were no vitals taken for this visit.  Physical Exam well-developed well-nourished gentleman in no acute distress.  Alert and oriented x3.  Ortho Exam examination of both hips reveals a markedly positive logroll and FADIR.  Minimally positive straight leg raise.  He is neurovascular intact distally.  Specialty Comments:  No specialty comments available.  Imaging: XR HIPS BILAT W OR W/O PELVIS 3-4 VIEWS  Result Date: 10/21/2019 Marked degenerative changes to both hips  XR Lumbar Spine 2-3 Views  Result Date: 10/21/2019 No acute or structural abnormalities    PMFS History: Patient Active Problem List   Diagnosis Date Noted  . Hypomagnesemia 03/03/2018  . HTN (hypertension) 03/02/2018  . Alcoholic hepatitis without ascites 03/02/2018  . Chronic anemia 03/02/2018  . Alcohol abuse 03/01/2018  . Tobacco abuse 03/01/2018  . PE (pulmonary thromboembolism) (HCC) 03/01/2018  . Acute respiratory failure with hypoxia (HCC) 03/01/2018  . Thrombocytopenia (HCC) 03/01/2018  . Asthma 03/01/2018  . Exacerbation of asthma   . Alcohol dependence (HCC) 09/05/2012  . 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 Asthma   .  Asthma   . Hypertension     Family History  Problem Relation Age of Onset  . Heart attack Mother     Past Surgical History:  Procedure Laterality Date  . Dental procedure     Social History   Occupational History  . Not on file  Tobacco Use  . Smoking status: Current Some Day Smoker    Packs/day: 0.50    Types: Cigarettes  . Smokeless tobacco: Never Used  Vaping Use  . Vaping Use: Never used  Substance and Sexual Activity  . Alcohol use: Not Currently  . Drug use: Not Currently  . Sexual activity: Yes    Birth control/protection: None    Comment: Married

## 2019-10-29 MED FILL — CARVEDILOL 3.125 MG TABLET: 3.125 | 30 days supply | Qty: 60 | Fill #5

## 2019-10-29 MED FILL — ALLOPURINOL 300 MG TAB: 300 | 30 days supply | Qty: 60 | Fill #1

## 2019-10-29 MED FILL — FOLIC ACID 1 MG TABS: 1 | 30 days supply | Qty: 30 | Fill #1

## 2019-10-30 ENCOUNTER — Other Ambulatory Visit: Payer: Self-pay | Admitting: Family Medicine

## 2019-10-30 NOTE — Telephone Encounter (Signed)
° °  Notes to clinic:  medication was given at [facility]  Review for refill   Requested Prescriptions  Pending Prescriptions Disp Refills   meloxicam (MOBIC) 7.5 MG tablet [Pharmacy Med Name: MELOXICAM 7.5 MG TABLET 7.5 Tablet] 30 tablet 0    Sig: Take 1 tablet (7.5 mg total) by mouth daily.      Analgesics:  COX2 Inhibitors Passed - 10/30/2019  2:06 PM      Passed - HGB in normal range and within 360 days    Hemoglobin  Date Value Ref Range Status  05/28/2019 15.0 13.0 - 17.7 g/dL Final          Passed - Cr in normal range and within 360 days    Creatinine, Ser  Date Value Ref Range Status  05/28/2019 0.85 0.76 - 1.27 mg/dL Final          Passed - Patient is not pregnant      Passed - Valid encounter within last 12 months    Recent Outpatient Visits           1 month ago Idiopathic chronic gout without tophus, unspecified site   [facility] [place], [place], MD   5 months ago Idiopathic chronic gout of left ankle without tophus   [facility] [place], Odette Horns, MD   5 months ago Arthralgia of left ankle   [facility] [place], Odette Horns, MD   10 months ago Acute idiopathic gout of left hand   [facility] Hoy Register, MD   1 year ago COVID-19 virus detected   [facility] Hoy Register, MD

## 2019-10-31 ENCOUNTER — Other Ambulatory Visit: Payer: Self-pay | Admitting: Family Medicine

## 2019-10-31 MED FILL — MELOXICAM 7.5 MG TABLET: 7.5 | 30 days supply | Qty: 30 | Fill #0

## 2019-11-19 ENCOUNTER — Ambulatory Visit (INDEPENDENT_AMBULATORY_CARE_PROVIDER_SITE_OTHER): Payer: Self-pay | Admitting: Orthopaedic Surgery

## 2019-11-19 ENCOUNTER — Encounter: Payer: Self-pay | Admitting: Orthopaedic Surgery

## 2019-11-19 VITALS — Ht 70.0 in | Wt 282.2 lb

## 2019-11-19 DIAGNOSIS — M160 Bilateral primary osteoarthritis of hip: Secondary | ICD-10-CM

## 2019-11-19 MED ORDER — HYDROCODONE-ACETAMINOPHEN 5-325 MG PO TABS: 1.0000 | ORAL_TABLET | Freq: Every day | ORAL | 0 refills | Status: DC | PRN

## 2019-11-19 NOTE — Progress Notes (Signed)
Office Visit Note   Patient: Daniel [name]           Date of Birth: 1970/09/15           MRN: [id number] Visit Date: 11/19/2019              Requested by: Hoy Register, MD [address] [place],  [place] [address] PCP: Hoy Register, MD   Assessment & Plan: Visit Diagnoses:  1. Bilateral primary osteoarthritis of hip     Plan: Impression is advanced degenerative joint disease bilateral hips right greater than left.  At this point, the patient has tried and failed intra-articular cortisone injections and would like to proceed with total hip arthroplasty.  He currently has a BMI of 40.46 and will need to lose approximately 5 pounds before proceeding with right total hip replacement.  Once he obtains this goal, he will follow-up for nurse visit to check his height weight and to schedule right total hip replacement.  We will likely need PCP clearance as well.  Call with concerns or questions.  I have agreed to call in one small prescription of Norco.  Follow-Up Instructions: Return if symptoms worsen or fail to improve.   Orders:  No orders of the defined types were placed in this encounter.  Meds ordered this encounter  Medications   HYDROcodone-acetaminophen (NORCO) 5-325 MG tablet    Sig: Take 1-2 tablets by mouth daily as needed.    Dispense:  10 tablet    Refill:  0      Procedures: No procedures performed   Clinical Data: No additional findings.   Subjective: Chief Complaint  Patient presents with   Right Hip - Pain   Left Hip - Pain    HPI patient is a pleasant 49 year old gentleman who comes in today with bilateral hip pain right greater than left.  History of advanced generative joint disease.  He was seen by [place] recently for this where he was referred to Dr. Prince Rome for bilateral cortisone injections.  He had good relief but only during the anesthetic phase.  His pain is returned and his really started to progress.  He has increased pain with  standing, sitting to standing as well as with walking.  He has been taking tramadol without relief.  He has a previous history of alcoholism but states that he no longer drinks.  He also has a history of pulmonary embolism but is not on any anticoagulants.  Review of Systems as detailed in HPI.  All others reviewed and are negative.   Objective: Vital Signs: Ht 5\' 10"  (1.778 m)    Wt 282 lb 3.2 oz (128 kg)    BMI 40.49 kg/m   Physical Exam well-developed well-nourished gentleman no acute distress.  Alert and oriented x3.  Ortho Exam bilateral hip exam shows positive logroll and FADIR both sides.  He is neurovascular intact distally.  Specialty Comments:  No specialty comments available.  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   Past Medical History:  Diagnosis Date   Asthma    Asthma    Hypertension     Family History  Problem Relation Age of Onset   Heart attack Mother     Past Surgical History:  Procedure Laterality Date   Dental procedure     Social History   Occupational History   Not on file  Tobacco Use   Smoking status: Current Some Day Smoker    Packs/day: 0.50    Types: Cigarettes   Smokeless tobacco: Never Used  Vaping Use   Vaping Use: Never used  Substance and Sexual Activity   Alcohol use: Not Currently   Drug use: Not Currently   Sexual activity: Yes    Birth  control/protection: None    Comment: Married

## 2019-11-20 ENCOUNTER — Telehealth: Payer: Self-pay | Admitting: Orthopaedic Surgery

## 2019-11-20 NOTE — Telephone Encounter (Signed)
Surgery date is to be determined. We are awaiting surgical clearance and weight loss

## 2019-11-20 NOTE — Telephone Encounter (Signed)
Please advise 

## 2019-11-20 NOTE — Telephone Encounter (Signed)
Patient states he was not able to stay at work today  Because he could not stand and he will need a out of work note until after surgery. His employer is requesting a note that states he will be having surgery (with date & time) and when he will be able to return . Please call patient ASAP, he need to turn in the note today.

## 2019-11-21 ENCOUNTER — Telehealth: Payer: Self-pay

## 2019-11-21 NOTE — Telephone Encounter (Signed)
Note made. Patient aware.

## 2019-11-21 NOTE — Telephone Encounter (Signed)
Sure until he recovers from surgery

## 2019-11-21 NOTE — Telephone Encounter (Signed)
Patient called he is requesting a letter for his job stating hip replacement surgery has been approved and is being scheduled in the future without a set date at the moment. He is requesting this note just to have in his HR Records. He will come pick note up. Call [phone]

## 2019-11-21 NOTE — Telephone Encounter (Signed)
Ready for pick up - pt aware 

## 2019-11-21 NOTE — Telephone Encounter (Signed)
Are you going to take him out of work?

## 2019-12-01 ENCOUNTER — Other Ambulatory Visit: Payer: Self-pay | Admitting: Family Medicine

## 2019-12-01 MED FILL — ALLOPURINOL 300 MG TAB: 300 | 30 days supply | Qty: 60 | Fill #2

## 2019-12-01 MED FILL — CARVEDILOL 3.125 MG TABLET: 3.125 | 30 days supply | Qty: 60 | Fill #6

## 2019-12-01 MED FILL — FOLIC ACID 1 MG TABS: 1 | 30 days supply | Qty: 30 | Fill #2

## 2019-12-01 MED FILL — MELOXICAM 7.5 MG TABLET: 7.5 | 30 days supply | Qty: 30 | Fill #0

## 2019-12-01 NOTE — Telephone Encounter (Signed)
Requested Prescriptions  Pending Prescriptions Disp Refills   meloxicam (MOBIC) 7.5 MG tablet [Pharmacy Med Name: MELOXICAM 7.5 MG TABLET 7.5 Tablet] 90 tablet 1    Sig: TAKE 1 TABLET (7.5 MG TOTAL) BY MOUTH DAILY.     Analgesics:  COX2 Inhibitors Passed - 12/01/2019 11:57 AM      Passed - HGB in normal range and within 360 days    Hemoglobin  Date Value Ref Range Status  05/28/2019 15.0 13.0 - 17.7 g/dL Final         Passed - Cr in normal range and within 360 days    Creatinine, Ser  Date Value Ref Range Status  05/28/2019 0.85 0.76 - 1.27 mg/dL Final         Passed - Patient is not pregnant      Passed - Valid encounter within last 12 months    Recent Outpatient Visits          2 months ago Idiopathic chronic gout without tophus, unspecified site   [facility] [place], [place], MD   6 months ago Idiopathic chronic gout of left ankle without tophus   [facility] [place], Odette Horns, MD   6 months ago Arthralgia of left ankle   [facility] [place], Odette Horns, MD   11 months ago Acute idiopathic gout of left hand   [facility] Hoy Register, MD   1 year ago COVID-19 virus detected   [facility] Hoy Register, MD

## 2019-12-31 ENCOUNTER — Telehealth: Payer: Self-pay | Admitting: Orthopaedic Surgery

## 2019-12-31 ENCOUNTER — Other Ambulatory Visit: Payer: Self-pay | Admitting: Family Medicine

## 2019-12-31 ENCOUNTER — Other Ambulatory Visit: Payer: Self-pay | Admitting: Orthopaedic Surgery

## 2019-12-31 DIAGNOSIS — I10 Essential (primary) hypertension: Secondary | ICD-10-CM

## 2019-12-31 MED ORDER — TRAMADOL HCL 50 MG PO TABS: 50.0000 mg | ORAL_TABLET | Freq: Every day | ORAL | 0 refills | Status: DC | PRN

## 2019-12-31 MED FILL — CARVEDILOL 3.125 MG TABLET: 3.125 | 30 days supply | Qty: 60 | Fill #0

## 2019-12-31 MED FILL — MELOXICAM 7.5 MG TABLET: 7.5 | 30 days supply | Qty: 30 | Fill #1

## 2019-12-31 MED FILL — FOLIC ACID 1 MG TABS: 1 | 30 days supply | Qty: 30 | Fill #3

## 2019-12-31 MED FILL — ALLOPURINOL 300 MG TAB: 300 | 30 days supply | Qty: 60 | Fill #3

## 2019-12-31 NOTE — Telephone Encounter (Signed)
I sent tramadol

## 2019-12-31 NOTE — Telephone Encounter (Signed)
Patient uses the [facility]  on [address].  Please see previous note.    Daniel [name]'s ph# is [phone]

## 2019-12-31 NOTE — Telephone Encounter (Signed)
Patient's wife called asking for pain medication for pt. Patient is in sever pains and has an upcoming appt 12/14. Please send to pharmacy on file. If it is a strong medication please send to [facility] on [address]. Patient phone number is [phone].

## 2020-01-01 MED FILL — traMADol HCL 50 MG TABS: 50 | 15 days supply | Qty: 30 | Fill #0

## 2020-01-05 ENCOUNTER — Other Ambulatory Visit: Payer: Self-pay | Admitting: Family Medicine

## 2020-01-05 ENCOUNTER — Encounter (HOSPITAL_COMMUNITY): Payer: Self-pay | Admitting: Emergency Medicine

## 2020-01-05 ENCOUNTER — Other Ambulatory Visit: Payer: Self-pay

## 2020-01-05 ENCOUNTER — Ambulatory Visit (HOSPITAL_COMMUNITY)
Admission: EM | Admit: 2020-01-05 | Discharge: 2020-01-05 | Disposition: A | Discharge status: Home / Self Care | Payer: HRSA Program | Attending: Family Medicine | Admitting: Family Medicine

## 2020-01-05 DIAGNOSIS — Z20822 Contact with and (suspected) exposure to covid-19: Secondary | ICD-10-CM | POA: Diagnosis not present

## 2020-01-05 DIAGNOSIS — J4521 Mild intermittent asthma with (acute) exacerbation: Secondary | ICD-10-CM | POA: Diagnosis present

## 2020-01-05 DIAGNOSIS — R030 Elevated blood-pressure reading, without diagnosis of hypertension: Secondary | ICD-10-CM | POA: Diagnosis present

## 2020-01-05 DIAGNOSIS — J069 Acute upper respiratory infection, unspecified: Secondary | ICD-10-CM | POA: Diagnosis present

## 2020-01-05 MED ORDER — PREDNISONE 20 MG PO TABS: 20.0000 mg | ORAL_TABLET | Freq: Two times a day (BID) | ORAL | 0 refills | Status: DC

## 2020-01-05 MED ORDER — ALBUTEROL SULFATE HFA 108 (90 BASE) MCG/ACT IN AERS
2.0000 | INHALATION_SPRAY | Freq: Once | RESPIRATORY_TRACT | Status: AC
  Administered 2020-01-05: 2 via RESPIRATORY_TRACT

## 2020-01-05 MED ORDER — ALBUTEROL SULFATE HFA 108 (90 BASE) MCG/ACT IN AERS
INHALATION_SPRAY | RESPIRATORY_TRACT | Status: AC
  Filled 2020-01-05: qty 6.7

## 2020-01-05 MED ORDER — BENZONATATE 200 MG PO CAPS: 200.0000 mg | ORAL_CAPSULE | Freq: Two times a day (BID) | ORAL | 0 refills | Status: DC | PRN

## 2020-01-05 MED FILL — predniSONE 20 MG TABS: 20 | 5 days supply | Qty: 10 | Fill #0

## 2020-01-05 MED FILL — BENZONATATE 100 MG CAPS: 100 | 10 days supply | Qty: 40 | Fill #0

## 2020-01-05 NOTE — Discharge Instructions (Signed)
Go home to rest Drink plenty of fluids Take the prednisone 2 x a day Take the tessalon 2-3 x a day as needed cough Take Tylenol for pain or fever You may take over-the-counter cough and cold medicines as needed You must quarantine at home until your test result is available You can check for your test result in MyChart

## 2020-01-05 NOTE — ED Provider Notes (Signed)
[facility]    CSN: [id number] Arrival date & time: 01/05/20  1133      History   Chief Complaint Chief Complaint  Patient presents with  . Cough  . Shortness of Breath    HPI Daniel [name] is a 49 y.o. male.   HPI   Patient has underlying asthma.  He states that he has an albuterol inhaler.  He has not used it since last night.  He is here for an upper respiratory infection.  He has a cough, cold, shortness of breath.  He has some fatigue.  He states that he has been coughing up scant sputum.  He has had both Covid vaccinations and his booster.  He states his wife is sick at home as well.  He has not been exposed to any illness.  No fever or chills, headache or body aches. Patient states he is compliant with his blood pressure medication.  He does keep his medical visits.  Patient's blood pressure is elevated today.  He is told to follow-up with his primary care  Past Medical History:  Diagnosis Date  . Asthma   . Asthma   . Hypertension     Patient Active Problem List   Diagnosis Date Noted  . Hypomagnesemia 03/03/2018  . HTN (hypertension) 03/02/2018  . Alcoholic hepatitis without ascites 03/02/2018  . Chronic anemia 03/02/2018  . Alcohol abuse 03/01/2018  . Tobacco abuse 03/01/2018  . PE (pulmonary thromboembolism) (HCC) 03/01/2018  . Acute respiratory failure with hypoxia (HCC) 03/01/2018  . Thrombocytopenia (HCC) 03/01/2018  . Asthma 03/01/2018  . Exacerbation of asthma   . Alcohol dependence (HCC) 09/05/2012  . 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 Dental procedure         Home Medications    Prior to Admission medications   Medication Sig Start Date End Date Taking? Authorizing Provider  albuterol (VENTOLIN HFA) 108 (90 Base) MCG/ACT inhaler Inhale 1 puff into the lungs every 6 (six) hours as needed for wheezing or shortness of  breath. 05/28/19   Hoy Register, MD  allopurinol (ZYLOPRIM) 300 MG tablet Take 1 tablet (300 mg total) by mouth 2 (two) times daily. 09/30/19   Hoy Register, MD  benzonatate (TESSALON) 200 MG capsule Take 1 capsule (200 mg total) by mouth 2 (two) times daily as needed for cough. 01/05/20   Eustace Moore, MD  carvedilol (COREG) 3.125 MG tablet TAKE 1 TABLET (3.125 MG TOTAL) BY MOUTH 2 (TWO) TIMES DAILY WITH A MEAL. 12/31/19   Hoy Register, MD  colchicine 0.6 MG tablet Take 2 tabs (1.2mg ) at the onset of a Gout flare, may repeat 1 tab (0.6mg ) after 2 hours if symptoms persist 09/30/19   Hoy Register, MD  folic acid (FOLVITE) 1 MG tablet Take 1 tablet (1 mg total) by mouth daily. 09/30/19   Hoy Register, MD  HYDROcodone-acetaminophen (NORCO) 5-325 MG tablet Take 1-2 tablets by mouth daily as needed. 11/19/19   Cristie Hem, PA-C  hydrocortisone 1 % ointment Apply 1 application topically 2 (two) times daily. 05/28/19   Hoy Register, MD  meloxicam (MOBIC) 7.5 MG tablet TAKE 1 TABLET (7.5 MG TOTAL) BY MOUTH DAILY. 12/01/19   Hoy Register, MD  Multiple Vitamin (MULTIVITAMIN WITH MINERALS) TABS tablet Take 1 tablet by mouth daily. Patient not taking: Reported on 05/16/2018 03/05/18   Laverna Peace, MD  potassium chloride  SA (KLOR-CON) 20 MEQ tablet Take 1 tablet (20 mEq total) by mouth daily. Patient not taking: Reported on 05/28/2019 12/17/18   Hoy Register, MD  predniSONE (DELTASONE) 20 MG tablet Take 1 tablet (20 mg total) by mouth 2 (two) times daily with a meal. 01/05/20   Eustace Moore, MD  thiamine 100 MG tablet Take 1 tablet (100 mg total) by mouth daily. 05/28/19   Hoy Register, MD  traMADol (ULTRAM) 50 MG tablet Take 1-2 tablets (50-100 mg total) by mouth daily as needed. 12/31/19   Tarry Kos, MD    Family History Family History  Problem Relation Age of Onset  . Heart attack Mother     Social History Social History   Tobacco Use  . Smoking status: Current  Some Day Smoker    Packs/day: 0.50    Types: Cigarettes  . Smokeless tobacco: Never Used  Vaping Use  . Vaping Use: Never used  Substance Use Topics  . Alcohol use: Not Currently  . Drug use: Not Currently     Allergies   Patient has no known allergies.   Review of Systems Review of Systems See HPI  Physical Exam Triage Vital Signs ED Triage Vitals  Enc Vitals Group     BP 01/05/20 1327 (!) 184/113     Pulse Rate 01/05/20 1327 (!) 111     Resp 01/05/20 1327 18     Temp 01/05/20 1327 100 F (37.8 C)     Temp Source 01/05/20 1327 Oral     SpO2 --      Weight --      Height --      Head Circumference --      Peak Flow --      Pain Score 01/05/20 1325 0     Pain Loc --      Pain Edu? --      Excl. in GC? --    No data found.  Updated Vital Signs BP (!) 184/113 (BP Location: Left Arm)   Pulse (!) 111   Temp 100 F (37.8 C) (Oral)   Resp 18      Physical Exam Constitutional:      General: He is not in acute distress.    Appearance: He is well-developed and well-nourished. He is obese. He is ill-appearing.  HENT:     Head: Normocephalic and atraumatic.     Nose: No congestion.     Mouth/Throat:     Mouth: Oropharynx is clear and moist.     Pharynx: No posterior oropharyngeal erythema.     Comments: Mask is in place.  Appears well-hydrated Eyes:     Conjunctiva/sclera: Conjunctivae normal.     Pupils: Pupils are equal, round, and reactive to light.  Cardiovascular:     Rate and Rhythm: Normal rate and regular rhythm.     Heart sounds: Normal heart sounds.  Pulmonary:     Effort: Pulmonary effort is normal. No respiratory distress.     Breath sounds: Wheezing present. No rhonchi or rales.     Comments: Scattered wheezes throughout both lung fields.  Albuterol inhaler administered.  Reexamination reveals improved air movement, still scattered wheeze.  No rales or rhonchi Chest:     Chest wall: No tenderness.  Abdominal:     General: There is no  distension.     Palpations: Abdomen is soft.  Musculoskeletal:        General: No edema. Normal range of motion.  Cervical back: Normal range of motion.  Skin:    General: Skin is warm and dry.  Neurological:     Mental Status: He is alert.  Psychiatric:        Behavior: Behavior normal.      UC Treatments / Results  Labs (all labs ordered are listed, but only abnormal results are displayed) Labs Reviewed  SARS CORONAVIRUS 2 (TAT 6-24 HRS)    EKG   Radiology No results found.  Procedures Procedures (including critical care time)  Medications Ordered in UC Medications  albuterol (VENTOLIN HFA) 108 (90 Base) MCG/ACT inhaler 2 puff (2 puffs Inhalation Given 01/05/20 1348)    Initial Impression / Assessment and Plan / UC Course  I have reviewed the triage vital signs and the nursing notes.  Pertinent labs & imaging results that were available during my care of the patient were reviewed by me and considered in my medical decision making (see chart for details).     Reviewed that it is possible to get Covid reviewed with vaccinations.  Recommend Covid testing.  Patient likely has a viral infection.  Exacerbation of his asthma.  Home care, fluids, medication discussed Final Clinical Impressions(s) / UC Diagnoses   Final diagnoses:  Viral URI with cough  Mild intermittent asthma with acute exacerbation  Elevated blood pressure reading     Discharge Instructions     Go home to rest Drink plenty of fluids Take the prednisone 2 x a day Take the tessalon 2-3 x a day as needed cough Take Tylenol for pain or fever You may take over-the-counter cough and cold medicines as needed You must quarantine at home until your test result is available You can check for your test result in MyChart    ED Prescriptions    Medication Sig Dispense Auth. Provider   predniSONE (DELTASONE) 20 MG tablet Take 1 tablet (20 mg total) by mouth 2 (two) times daily with a meal. 10  tablet Eustace Moore, MD   benzonatate (TESSALON) 200 MG capsule Take 1 capsule (200 mg total) by mouth 2 (two) times daily as needed for cough. 20 capsule Eustace Moore, MD     PDMP not reviewed this encounter.   Eustace Moore, MD 01/05/20 [phone]

## 2020-01-05 NOTE — ED Triage Notes (Addendum)
Pt presents with productive cough and SOB xs 4 days. States has already had Covid vaccine and booster when asked if he would like COVID testing today. States has not taken BP medications today.

## 2020-01-06 ENCOUNTER — Other Ambulatory Visit: Payer: Self-pay | Admitting: Physician Assistant

## 2020-01-06 ENCOUNTER — Ambulatory Visit: Payer: Self-pay

## 2020-01-06 ENCOUNTER — Encounter: Payer: Self-pay | Admitting: Orthopaedic Surgery

## 2020-01-06 ENCOUNTER — Ambulatory Visit (INDEPENDENT_AMBULATORY_CARE_PROVIDER_SITE_OTHER): Payer: Self-pay | Admitting: Orthopaedic Surgery

## 2020-01-06 ENCOUNTER — Ambulatory Visit (INDEPENDENT_AMBULATORY_CARE_PROVIDER_SITE_OTHER): Payer: Self-pay

## 2020-01-06 VITALS — Ht 68.5 in | Wt 277.4 lb

## 2020-01-06 DIAGNOSIS — G8929 Other chronic pain: Secondary | ICD-10-CM

## 2020-01-06 DIAGNOSIS — M25562 Pain in left knee: Secondary | ICD-10-CM

## 2020-01-06 DIAGNOSIS — M25561 Pain in right knee: Secondary | ICD-10-CM

## 2020-01-06 DIAGNOSIS — M160 Bilateral primary osteoarthritis of hip: Secondary | ICD-10-CM

## 2020-01-06 LAB — SARS CORONAVIRUS 2 (TAT 6-24 HRS): SARS Coronavirus 2: NEGATIVE

## 2020-01-06 MED ORDER — ACETAMINOPHEN-CODEINE #3 300-30 MG PO TABS: 1.0000 | ORAL_TABLET | Freq: Three times a day (TID) | ORAL | 0 refills | Status: DC | PRN

## 2020-01-06 MED FILL — ACETAMINOPHEN-COD #3 TABLET: 300-30 | 10 days supply | Qty: 30 | Fill #0

## 2020-01-06 NOTE — Progress Notes (Signed)
Office Visit Note   Patient: Daniel [name]           Date of Birth: 1970-08-24           MRN: [id number] Visit Date: 01/06/2020              Requested by: Daniel Register, MD [address] [place],  [place] [address] PCP: Daniel Register, MD   Assessment & Plan: Visit Diagnoses:  1. Bilateral primary osteoarthritis of hip   2. Chronic pain of left knee   3. Chronic pain of right knee     Plan: Impression is advanced degenerative joint disease both hips and new onset bilateral knee pain.  I believe his the pain is actually coming from his hips and would not recommend cortisone injection at this point time.  In regards to the patient's hips, he has not previously had relief from intra-articular cortisone injection so we will not repeat that again.  We have discussed total hip arthroplasty, but he needs to lose approximately 17 pounds to get to a weight of 260 pounds and a BMI of 40 in order to proceed with surgical intervention.  He is aware and agrees.  He will follow up with [place] once he is attain that goal.  Follow-Up Instructions: Return if symptoms worsen or fail to improve.   Orders:  Orders Placed This Encounter  Procedures  . XR KNEE 3 VIEW LEFT  . XR KNEE 3 VIEW RIGHT   Meds ordered this encounter  Medications  . acetaminophen-codeine (TYLENOL #3) 300-30 MG tablet    Sig: Take 1 tablet by mouth every 8 (eight) hours as needed for moderate pain.    Dispense:  30 tablet    Refill:  0      Procedures: No procedures performed   Clinical Data: No additional findings.   Subjective: Chief Complaint  Patient presents with  . Right Knee - Pain  . Left Knee - Pain    HPI patient is a very pleasant 49 year old gentleman who comes in today with continued bilateral hip pain right greater than left as well as bilateral knee pain left greater than right.  History of advanced degenerative joint disease to both hips with the right being worse.  He has recently had  cortisone injections with Dr. Prince [name] which of minimally helped his pain.  He has been taking tramadol without significant relief.  No previous cortisone injections to either knee.  The pain in the knees that he is actually having is to the top of both knees.  He continues to have pain on an anterior thigh.  Pain is worse going from a seated to standing position.  Review of Systems as detailed HPI.  All others reviewed and are negative.   Objective: Vital Signs: Ht 5' 8.5" (1.74 m)   Wt 277 lb 6.4 oz (125.8 kg)   BMI 41.57 kg/m   Physical Exam well-developed well-nourished gentleman in no acute distress.  Alert oriented x3.  Ortho Exam examination of both hips reveals a markedly positive logroll and FADIR test.  Bilateral knee exams show no effusion.  No joint line tenderness.  Mild patellofemoral crepitus both sides.  Range of motion 0 to 110 degrees.  He is neurovascular intact distally.  Specialty Comments:  No specialty comments available.  Imaging: XR KNEE 3 VIEW LEFT  Result Date: 01/06/2020 Mild to moderate degenerative changes medial compartment  XR KNEE 3 VIEW RIGHT  Result Date: 01/06/2020 Mild to moderate degenerative changes  the medial compartment    PMFS History: Patient Active Problem List   Diagnosis Date Noted  . Hypomagnesemia 03/03/2018  . HTN (hypertension) 03/02/2018  . Alcoholic hepatitis without ascites 03/02/2018  . Chronic anemia 03/02/2018  . Alcohol abuse 03/01/2018  . Tobacco abuse 03/01/2018  . PE (pulmonary thromboembolism) (HCC) 03/01/2018  . Acute respiratory failure with hypoxia (HCC) 03/01/2018  . Thrombocytopenia (HCC) 03/01/2018  . Asthma 03/01/2018  . Exacerbation of asthma   . Alcohol dependence (HCC) 09/05/2012  . 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 Asthma   . Asthma   . Hypertension     Family History  Problem Relation Age of Onset   . Heart attack Mother     Past Surgical History:  Procedure Laterality Date  . Dental procedure     Social History   Occupational History  . Not on file  Tobacco Use  . Smoking status: Current Some Day Smoker    Packs/day: 0.50    Types: Cigarettes  . Smokeless tobacco: Never Used  Vaping Use  . Vaping Use: Never used  Substance and Sexual Activity  . Alcohol use: Not Currently  . Drug use: Not Currently  . Sexual activity: Yes    Birth control/protection: None    Comment: Married

## 2020-02-10 ENCOUNTER — Telehealth: Payer: Self-pay | Admitting: Family Medicine

## 2020-02-10 NOTE — Telephone Encounter (Signed)
Called patient and LVM letting him advising him that his appointment for tomorrow has been cancelled due to the provider being out of the office. Apologized for the extremely short notice and  Advised patient to call [phone] to schedule.

## 2020-02-11 ENCOUNTER — Telehealth: Payer: Self-pay | Admitting: Family Medicine

## 2020-02-11 ENCOUNTER — Ambulatory Visit: Payer: Self-pay | Admitting: Family Medicine

## 2020-02-11 DIAGNOSIS — M1A00X0 Idiopathic chronic gout, unspecified site, without tophus (tophi): Secondary | ICD-10-CM

## 2020-02-11 NOTE — Telephone Encounter (Signed)
Patient appt for 1/19 was cancelled and reschedule for 2/14 at 2:10pm. Patient stated medication will not last until the next appt. Patient is requesting refills on all medication until next appt. Specifically asked for Ventolin, Coreg, Tylenol #3, Zyloprim and  Meloxicam.   Patient also would like to know if the Folic Acid is still need and would like a call back from the doctor or nurse.

## 2020-02-11 NOTE — Telephone Encounter (Signed)
Will route to PCP for review. 

## 2020-02-12 ENCOUNTER — Other Ambulatory Visit: Payer: Self-pay | Admitting: Family Medicine

## 2020-02-12 MED ORDER — ALBUTEROL SULFATE HFA 108 (90 BASE) MCG/ACT IN AERS
1.0000 | INHALATION_SPRAY | Freq: Four times a day (QID) | RESPIRATORY_TRACT | 2 refills | Status: DC | PRN
  Filled 2020-07-05 – 2020-07-06 (×2): qty 18, 25d supply, fill #0

## 2020-02-12 MED ORDER — MELOXICAM 7.5 MG PO TABS: 7.5000 mg | ORAL_TABLET | Freq: Every day | ORAL | 1 refills | Status: DC

## 2020-02-12 MED ORDER — ALLOPURINOL 300 MG PO TABS: 300.0000 mg | ORAL_TABLET | Freq: Two times a day (BID) | ORAL | 3 refills | Status: DC

## 2020-02-12 MED FILL — ALLOPURINOL 300 MG TAB: 300 | 30 days supply | Qty: 60 | Fill #0

## 2020-02-12 MED FILL — MELOXICAM 7.5 MG TABLET: 7.5 | 30 days supply | Qty: 30 | Fill #0

## 2020-02-12 MED FILL — $VENTOLIN HFA 18G INHALER: 108 (90 BAS | 25 days supply | Qty: 18 | Fill #0

## 2020-02-12 NOTE — Telephone Encounter (Signed)
Pt was called and informed of medication being sent to pharmacy. 

## 2020-02-12 NOTE — Telephone Encounter (Signed)
Done.  Tylenol 3 was prescribed by orthopedics and he needs to obtain refill from them.

## 2020-02-16 MED FILL — CARVEDILOL 3.125 MG TABLET: 3.125 | 30 days supply | Qty: 60 | Fill #1

## 2020-02-24 ENCOUNTER — Other Ambulatory Visit: Payer: Self-pay | Admitting: Orthopaedic Surgery

## 2020-02-24 ENCOUNTER — Ambulatory Visit (INDEPENDENT_AMBULATORY_CARE_PROVIDER_SITE_OTHER): Payer: BC Managed Care – PPO | Admitting: Orthopaedic Surgery

## 2020-02-24 ENCOUNTER — Other Ambulatory Visit: Payer: Self-pay

## 2020-02-24 ENCOUNTER — Encounter: Payer: Self-pay | Admitting: Orthopaedic Surgery

## 2020-02-24 VITALS — Ht 68.0 in | Wt 284.0 lb

## 2020-02-24 DIAGNOSIS — M1611 Unilateral primary osteoarthritis, right hip: Secondary | ICD-10-CM

## 2020-02-24 DIAGNOSIS — M1612 Unilateral primary osteoarthritis, left hip: Secondary | ICD-10-CM | POA: Diagnosis not present

## 2020-02-24 DIAGNOSIS — M160 Bilateral primary osteoarthritis of hip: Secondary | ICD-10-CM | POA: Insufficient documentation

## 2020-02-24 MED ORDER — ACETAMINOPHEN-CODEINE #3 300-30 MG PO TABS: 1.0000 | ORAL_TABLET | Freq: Every day | ORAL | 0 refills | Status: DC | PRN

## 2020-02-24 MED FILL — ACETAMINOPHEN-COD #3 TABLET: 300-30 | 15 days supply | Qty: 30 | Fill #0

## 2020-02-24 NOTE — Progress Notes (Signed)
Office Visit Note   Patient: Daniel [name]           Date of Birth: 06/06/1970           MRN: [id number] Visit Date: 02/24/2020              Requested by: Hoy Register, MD [address] [place],  [place] [address] PCP: Hoy Register, MD   Assessment & Plan: Visit Diagnoses:  1. Primary osteoarthritis of right hip   2. Primary osteoarthritis of left hip     Plan: Impression is end-stage bilateral hip DJD without relief from conservative treatment such as medications, injections, physical therapy.  He will make all attempts at weight loss.  Target weight is 260 pounds to achieve a BMI of 40 or less.  Tylenol 3 refilled today.  He will follow-up with [place] once he has achieved his target weight.  Follow-Up Instructions: Return if symptoms worsen or fail to improve.   Orders:  No orders of the defined types were placed in this encounter.  Meds ordered this encounter  Medications  . acetaminophen-codeine (TYLENOL #3) 300-30 MG tablet    Sig: Take 1-2 tablets by mouth daily as needed for moderate pain.    Dispense:  30 tablet    Refill:  0      Procedures: No procedures performed   Clinical Data: No additional findings.   Subjective: Chief Complaint  Patient presents with  . Right Hip - Pain  . Left Hip - Pain  . Right Knee - Pain  . Left Knee - Pain    Lion returns today for follow-up of bilateral hip DJD.  He has severe pain him both of his hips and groin constantly that is very functionally limiting.  Previous injections have not helped significantly.  Tylenol is currently what he takes.  Requesting a refill of Tylenol 3.  He has severe pain with ambulation and weightbearing.  He has not made any progress in terms of weight loss and in fact it is probably gotten worse.   Review of Systems  Constitutional: Negative.   All other systems reviewed and are negative.    Objective: Vital Signs: Ht 5\' 8"  (1.727 m)   Wt 284 lb (128.8 kg)   BMI 43.18 kg/m    Physical Exam Vitals and nursing note reviewed.  Constitutional:      Appearance: He is well-developed and well-nourished.  Pulmonary:     Effort: Pulmonary effort is normal.  Abdominal:     Palpations: Abdomen is soft.  Skin:    General: Skin is warm.  Neurological:     Mental Status: He is alert and oriented to person, place, and time.  Psychiatric:        Mood and Affect: Mood and affect normal.        Behavior: Behavior normal.        Thought Content: Thought content normal.        Judgment: Judgment normal.     Ortho Exam Bilateral hips show severe pain with any attempted range of motion.  Range of motion is severely limited. Specialty Comments:  No specialty comments available.  Imaging: No results found.   PMFS History: Patient Active Problem List   Diagnosis Date Noted  . Hypomagnesemia 03/03/2018  . HTN (hypertension) 03/02/2018  . Alcoholic hepatitis without ascites 03/02/2018  . Chronic anemia 03/02/2018  . Alcohol abuse 03/01/2018  . Tobacco abuse 03/01/2018  . PE (pulmonary thromboembolism) (HCC) 03/01/2018  .  Acute respiratory failure with hypoxia (HCC) 03/01/2018  . Thrombocytopenia (HCC) 03/01/2018  . Asthma 03/01/2018  . Exacerbation of asthma   . Alcohol dependence (HCC) 09/05/2012  . 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 Asthma   . Asthma   . Hypertension     Family History  Problem Relation Age of Onset  . Heart attack Mother     Past Surgical History:  Procedure Laterality Date  . Dental procedure     Social History   Occupational History  . Not on file  Tobacco Use  . Smoking status: Current Some Day Smoker    Packs/day: 0.50    Types: Cigarettes  . Smokeless tobacco: Never Used  Vaping Use  . Vaping Use: Never used  Substance and Sexual Activity  . Alcohol use: Not Currently  . Drug use: Not Currently  . Sexual activity: Yes     Birth control/protection: None    Comment: Married

## 2020-03-08 ENCOUNTER — Other Ambulatory Visit: Payer: Self-pay

## 2020-03-08 ENCOUNTER — Other Ambulatory Visit: Payer: Self-pay | Admitting: Family Medicine

## 2020-03-08 ENCOUNTER — Ambulatory Visit: Payer: BC Managed Care – PPO | Attending: Family Medicine | Admitting: Family Medicine

## 2020-03-08 ENCOUNTER — Encounter: Payer: Self-pay | Admitting: Family Medicine

## 2020-03-08 VITALS — BP 151/94 | HR 99 | Ht 68.0 in | Wt 291.0 lb

## 2020-03-08 DIAGNOSIS — Z1211 Encounter for screening for malignant neoplasm of colon: Secondary | ICD-10-CM | POA: Diagnosis not present

## 2020-03-08 DIAGNOSIS — M160 Bilateral primary osteoarthritis of hip: Secondary | ICD-10-CM

## 2020-03-08 DIAGNOSIS — I10 Essential (primary) hypertension: Secondary | ICD-10-CM

## 2020-03-08 DIAGNOSIS — Z1159 Encounter for screening for other viral diseases: Secondary | ICD-10-CM

## 2020-03-08 MED ORDER — CARVEDILOL 6.25 MG PO TABS
6.2500 mg | ORAL_TABLET | Freq: Two times a day (BID) | ORAL | 6 refills | Status: DC
Start: 2020-03-08 — End: 2020-03-08

## 2020-03-08 MED ORDER — TIZANIDINE HCL 4 MG PO TABS
4.0000 mg | ORAL_TABLET | Freq: Three times a day (TID) | ORAL | 1 refills | Status: DC | PRN
Start: 2020-03-08 — End: 2020-04-12

## 2020-03-08 MED FILL — CARVEDILOL 6.25 MG TABLET: 6.25 | 30 days supply | Qty: 60 | Fill #0

## 2020-03-08 MED FILL — tiZANidine HCL 4 MG TABS: 4 | 10 days supply | Qty: 30 | Fill #0

## 2020-03-08 NOTE — Patient Instructions (Signed)
Calorie Counting for Weight Loss Calories are units of energy. Your body needs a certain number of calories from food to keep going throughout the day. When you eat or drink more calories than your body needs, your body stores the extra calories mostly as fat. When you eat or drink fewer calories than your body needs, your body burns fat to get the energy it needs. Calorie counting means keeping track of how many calories you eat and drink each day. Calorie counting can be helpful if you need to lose weight. If you eat fewer calories than your body needs, you should lose weight. Ask your health care provider what a healthy weight is for you. For calorie counting to work, you will need to eat the right number of calories each day to lose a healthy amount of weight per week. A dietitian can help you figure out how many calories you need in a day and will suggest ways to reach your calorie goal.  A healthy amount of weight to lose each week is usually 1-2 lb (0.5-0.9 kg). This usually means that your daily calorie intake should be reduced by 500-750 calories.  Eating 1,200-1,500 calories a day can help most women lose weight.  Eating 1,500-1,800 calories a day can help most men lose weight. What do I need to know about calorie counting? Work with your health care provider or dietitian to determine how many calories you should get each day. To meet your daily calorie goal, you will need to:  Find out how many calories are in each food that you would like to eat. Try to do this before you eat.  Decide how much of the food you plan to eat.  Keep a food log. Do this by writing down what you ate and how many calories it had. To successfully lose weight, it is important to balance calorie counting with a healthy lifestyle that includes regular activity. Where do I find calorie information? The number of calories in a food can be found on a Nutrition Facts label. If a food does not have a Nutrition Facts  label, try to look up the calories online or ask your dietitian for help. Remember that calories are listed per serving. If you choose to have more than one serving of a food, you will have to multiply the calories per serving by the number of servings you plan to eat. For example, the label on a package of bread might say that a serving size is 1 slice and that there are 90 calories in a serving. If you eat 1 slice, you will have eaten 90 calories. If you eat 2 slices, you will have eaten 180 calories.   How do I keep a food log? After each time that you eat, record the following in your food log as soon as possible:  What you ate. Be sure to include toppings, sauces, and other extras on the food.  How much you ate. This can be measured in cups, ounces, or number of items.  How many calories were in each food and drink.  The total number of calories in the food you ate. Keep your food log near you, such as in a pocket-sized notebook or on an app or website on your mobile phone. Some programs will calculate calories for you and show you how many calories you have left to meet your daily goal. What are some portion-control tips?  Know how many calories are in a serving. This will   help you know how many servings you can have of a certain food.  Use a measuring cup to measure serving sizes. You could also try weighing out portions on a kitchen scale. With time, you will be able to estimate serving sizes for some foods.  Take time to put servings of different foods on your favorite plates or in your favorite bowls and cups so you know what a serving looks like.  Try not to eat straight from a food's packaging, such as from a bag or box. Eating straight from the package makes it hard to see how much you are eating and can lead to overeating. Put the amount you would like to eat in a cup or on a plate to make sure you are eating the right portion.  Use smaller plates, glasses, and bowls for smaller  portions and to prevent overeating.  Try not to multitask. For example, avoid watching TV or using your computer while eating. If it is time to eat, sit down at a table and enjoy your food. This will help you recognize when you are full. It will also help you be more mindful of what and how much you are eating. What are tips for following this plan? Reading food labels  Check the calorie count compared with the serving size. The serving size may be smaller than what you are used to eating.  Check the source of the calories. Try to choose foods that are high in protein, fiber, and vitamins, and low in saturated fat, trans fat, and sodium. Shopping  Read nutrition labels while you shop. This will help you make healthy decisions about which foods to buy.  Pay attention to nutrition labels for low-fat or fat-free foods. These foods sometimes have the same number of calories or more calories than the full-fat versions. They also often have added sugar, starch, or salt to make up for flavor that was removed with the fat.  Make a grocery list of lower-calorie foods and stick to it. Cooking  Try to cook your favorite foods in a healthier way. For example, try baking instead of frying.  Use low-fat dairy products. Meal planning  Use more fruits and vegetables. One-half of your plate should be fruits and vegetables.  Include lean proteins, such as chicken, turkey, and fish. Lifestyle Each week, aim to do one of the following:  150 minutes of moderate exercise, such as walking.  75 minutes of vigorous exercise, such as running. General information  Know how many calories are in the foods you eat most often. This will help you calculate calorie counts faster.  Find a way of tracking calories that works for you. Get creative. Try different apps or programs if writing down calories does not work for you. What foods should I eat?  Eat nutritious foods. It is better to have a nutritious,  high-calorie food, such as an avocado, than a food with few nutrients, such as a bag of potato chips.  Use your calories on foods and drinks that will fill you up and will not leave you hungry soon after eating. ? Examples of foods that fill you up are nuts and nut butters, vegetables, lean proteins, and high-fiber foods such as whole grains. High-fiber foods are foods with more than 5 g of fiber per serving.  Pay attention to calories in drinks. Low-calorie drinks include water and unsweetened drinks. The items listed above may not be a complete list of foods and beverages you can eat.   Contact a dietitian for more information.   What foods should I limit? Limit foods or drinks that are not good sources of vitamins, minerals, or protein or that are high in unhealthy fats. These include:  Candy.  Other sweets.  Sodas, specialty coffee drinks, alcohol, and juice. The items listed above may not be a complete list of foods and beverages you should avoid. Contact a dietitian for more information. How do I count calories when eating out?  Pay attention to portions. Often, portions are much larger when eating out. Try these tips to keep portions smaller: ? Consider sharing a meal instead of getting your own. ? If you get your own meal, eat only half of it. Before you start eating, ask for a container and put half of your meal into it. ? When available, consider ordering smaller portions from the menu instead of full portions.  Pay attention to your food and drink choices. Knowing the way food is cooked and what is included with the meal can help you eat fewer calories. ? If calories are listed on the menu, choose the lower-calorie options. ? Choose dishes that include vegetables, fruits, whole grains, low-fat dairy products, and lean proteins. ? Choose items that are boiled, broiled, grilled, or steamed. Avoid items that are buttered, battered, fried, or served with cream sauce. Items labeled as  crispy are usually fried, unless stated otherwise. ? Choose water, low-fat milk, unsweetened iced tea, or other drinks without added sugar. If you want an alcoholic beverage, choose a lower-calorie option, such as a glass of wine or light beer. ? Ask for dressings, sauces, and syrups on the side. These are usually high in calories, so you should limit the amount you eat. ? If you want a salad, choose a garden salad and ask for grilled meats. Avoid extra toppings such as bacon, cheese, or fried items. Ask for the dressing on the side, or ask for olive oil and vinegar or lemon to use as dressing.  Estimate how many servings of a food you are given. Knowing serving sizes will help you be aware of how much food you are eating at restaurants. Where to find more information  Centers for Disease Control and Prevention: www.cdc.gov  [place] Department of Agriculture: myplate.gov Summary  Calorie counting means keeping track of how many calories you eat and drink each day. If you eat fewer calories than your body needs, you should lose weight.  A healthy amount of weight to lose per week is usually 1-2 lb (0.5-0.9 kg). This usually means reducing your daily calorie intake by 500-750 calories.  The number of calories in a food can be found on a Nutrition Facts label. If a food does not have a Nutrition Facts label, try to look up the calories online or ask your dietitian for help.  Use smaller plates, glasses, and bowls for smaller portions and to prevent overeating.  Use your calories on foods and drinks that will fill you up and not leave you hungry shortly after a meal. This information is not intended to replace advice given to you by your health care provider. Make sure you discuss any questions you have with your health care provider. Document Revised: 02/20/2019 Document Reviewed: 02/20/2019 Elsevier Patient Education  2021 Elsevier Inc.  

## 2020-03-08 NOTE — Progress Notes (Signed)
Subjective:  Patient ID: Daniel [name], male    DOB: April 26, 1970  Age: 50 y.o. MRN: [id number]  CC: Hypertension and Hip Pain   HPI Daniel [name] is a 50 year old male with a history of  Previous alcohol abuse (quit 1 year ago) hypertension,pulmonary embolism(in 02/2018;completed course of anticoagulation) here for follow-up visit. He had a visit with orthopedic, Dr Erlinda Hong on 02/24/2020 for primary osteoarthritis of the right hip and per notes he was advised to work on weight loss to achieve a BMI of less than 40.  Tylenol 3 was refilled but patient states it is ineffective.  X-rays at that visit had revealed marked degenerative changes to both hips.  His knees also hurt.  Compliant with his antihypertensive but his blood pressure is elevated.  He has no means of checking his blood pressures at home  Past Medical History:  Diagnosis Date  . Asthma   . Asthma   . Hypertension     Past Surgical History:  Procedure Laterality Date  . Dental procedure      Family History  Problem Relation Age of Onset  . Heart attack Mother     No Known Allergies  Outpatient Medications Prior to Visit  Medication Sig Dispense Refill  . acetaminophen-codeine (TYLENOL #3) 300-30 MG tablet Take 1-2 tablets by mouth daily as needed for moderate pain. 30 tablet 0  . albuterol (VENTOLIN HFA) 108 (90 Base) MCG/ACT inhaler Inhale 1 puff into the lungs every 6 (six) hours as needed for wheezing or shortness of breath. 18 g 2  . allopurinol (ZYLOPRIM) 300 MG tablet Take 1 tablet (300 mg total) by mouth 2 (two) times daily. 60 tablet 3  . colchicine 0.6 MG tablet Take 2 tabs (1.27m) at the onset of a Gout flare, may repeat 1 tab (0.620m after 2 hours if symptoms persist 30 tablet 1  . HYDROcodone-acetaminophen (NORCO) 5-325 MG tablet Take 1-2 tablets by mouth daily as needed. 10 tablet 0  . hydrocortisone 1 % ointment Apply 1 application topically 2 (two) times daily. 30 g 2  . meloxicam (MOBIC) 7.5 MG  tablet Take 1 tablet (7.5 mg total) by mouth daily. 90 tablet 1  . thiamine 100 MG tablet Take 1 tablet (100 mg total) by mouth daily. 30 tablet 3  . carvedilol (COREG) 3.125 MG tablet TAKE 1 TABLET (3.125 MG TOTAL) BY MOUTH 2 (TWO) TIMES DAILY WITH A MEAL. 60 tablet 2  . benzonatate (TESSALON) 200 MG capsule Take 1 capsule (200 mg total) by mouth 2 (two) times daily as needed for cough. (Patient not taking: Reported on 03/08/2020) 20 capsule 0  . folic acid (FOLVITE) 1 MG tablet Take 1 tablet (1 mg total) by mouth daily. (Patient not taking: Reported on 03/08/2020) 30 tablet 3  . Multiple Vitamin (MULTIVITAMIN WITH MINERALS) TABS tablet Take 1 tablet by mouth daily. (Patient not taking: No sig reported) 30 tablet 0  . potassium chloride SA (KLOR-CON) 20 MEQ tablet Take 1 tablet (20 mEq total) by mouth daily. (Patient not taking: No sig reported) 30 tablet 6  . predniSONE (DELTASONE) 20 MG tablet Take 1 tablet (20 mg total) by mouth 2 (two) times daily with a meal. (Patient not taking: Reported on 03/08/2020) 10 tablet 0  . traMADol (ULTRAM) 50 MG tablet Take 1-2 tablets (50-100 mg total) by mouth daily as needed. (Patient not taking: Reported on 03/08/2020) 30 tablet 0   No facility-administered medications prior to visit.     ROS Review  of Systems  Constitutional: Negative for activity change and appetite change.  HENT: Negative for sinus pressure and sore throat.   Eyes: Negative for visual disturbance.  Respiratory: Negative for cough, chest tightness and shortness of breath.   Cardiovascular: Negative for chest pain and leg swelling.  Gastrointestinal: Negative for abdominal distention, abdominal pain, constipation and diarrhea.  Endocrine: Negative.   Genitourinary: Negative for dysuria.  Musculoskeletal:       See HPI  Skin: Negative for rash.  Allergic/Immunologic: Negative.   Neurological: Negative for weakness, light-headedness and numbness.  Psychiatric/Behavioral: Negative for  dysphoric mood and suicidal ideas.    Objective:  BP (!) 151/94   Pulse 99   Ht '5\' 8"'  (1.727 m)   Wt 291 lb (132 kg)   SpO2 98%   BMI 44.25 kg/m   BP/Weight 03/08/2020 02/24/2020 16/10/9602  Systolic BP 540 - -  Diastolic BP 94 - -  Wt. (Lbs) 291 284 277.4  BMI 44.25 43.18 41.57  Some encounter information is confidential and restricted. Go to Review Flowsheets activity to see all data.      Physical Exam Constitutional:      Appearance: He is well-developed. He is obese.  Neck:     Vascular: No JVD.  Cardiovascular:     Rate and Rhythm: Normal rate.     Heart sounds: Normal heart sounds. No murmur heard.   Pulmonary:     Effort: Pulmonary effort is normal.     Breath sounds: Normal breath sounds. No wheezing or rales.  Chest:     Chest wall: No tenderness.  Abdominal:     General: Bowel sounds are normal. There is no distension.     Palpations: Abdomen is soft. There is no mass.     Tenderness: There is no abdominal tenderness.  Musculoskeletal:     Right lower leg: No edema.     Left lower leg: No edema.     Comments: Tenderness in bilateral hip and bilateral knees on range of motion  Neurological:     Mental Status: He is alert and oriented to person, place, and time.     Gait: Gait abnormal.  Psychiatric:        Mood and Affect: Mood normal.     CMP Latest Ref Rng & Units 05/28/2019 11/24/2018 10/18/2018  Glucose 65 - 99 mg/dL 74 84 131(H)  BUN 6 - 24 mg/dL 9 5(L) 9  Creatinine 0.76 - 1.27 mg/dL 0.85 0.97 1.02  Sodium 134 - 144 mmol/L 143 137 139  Potassium 3.5 - 5.2 mmol/L 4.4 3.8 4.4  Chloride 96 - 106 mmol/L 99 101 98  CO2 20 - 29 mmol/L 19(L) 24 26  Calcium 8.7 - 10.2 mg/dL 9.5 9.0 9.8  Total Protein 6.0 - 8.5 g/dL 8.2 - -  Total Bilirubin 0.0 - 1.2 mg/dL 0.5 - -  Alkaline Phos 39 - 117 IU/L 102 - -  AST 0 - 40 IU/L 118(H) - -  ALT 0 - 44 IU/L 89(H) - -    Lipid Panel  No results found for: CHOL, TRIG, HDL, CHOLHDL, VLDL, LDLCALC,  LDLDIRECT  CBC    Component Value Date/Time   WBC 7.9 05/28/2019 1622   WBC 10.0 11/24/2018 1242   RBC 4.49 05/28/2019 1622   RBC 4.43 11/24/2018 1242   HGB 15.0 05/28/2019 1622   HCT 43.2 05/28/2019 1622   PLT 306 05/28/2019 1622   MCV 96 05/28/2019 1622   MCH 33.4 (H) 05/28/2019 1622  MCH 32.1 11/24/2018 1242   MCHC 34.7 05/28/2019 1622   MCHC 34.2 11/24/2018 1242   RDW 15.5 (H) 05/28/2019 1622   LYMPHSABS 1.8 05/28/2019 1622   MONOABS 0.9 11/24/2018 1242   EOSABS 0.1 05/28/2019 1622   BASOSABS 0.1 05/28/2019 1622    No results found for: HGBA1C  Assessment & Plan:  1. Essential hypertension Uncontrolled Coreg dose increased Counseled on blood pressure goal of less than 130/80, low-sodium, DASH diet, medication compliance, 150 minutes of moderate intensity exercise per week. Discussed medication compliance, adverse effects. - carvedilol (COREG) 6.25 MG tablet; Take 1 tablet (6.25 mg total) by mouth 2 (two) times daily with a meal.  Dispense: 60 tablet; Refill: 6 - CMP14+EGFR  2. Bilateral primary osteoarthritis of hip Uncontrolled Failed conservative therapy Needs to lose 20 pounds for orthopedics Tizanidine added to regimen and if pain is uncontrolled on regimen of tizanidine and Tylenol 3 which she received from orthopedic I will refer him to pain management - tiZANidine (ZANAFLEX) 4 MG tablet; Take 1 tablet (4 mg total) by mouth every 8 (eight) hours as needed for muscle spasms.  Dispense: 30 tablet; Refill: 1  3. Morbid obesity ([place]) We will refer to nutrition hopefully this will help with his weight loss - Amb ref to Medical Nutrition Therapy-MNT  4. Screening for colon cancer - Ambulatory referral to Gastroenterology  5. Need for hepatitis C screening test - HCV RNA quant rflx ultra or genotyp([facility])    Meds ordered this encounter  Medications  . carvedilol (COREG) 6.25 MG tablet    Sig: Take 1 tablet (6.25 mg total) by mouth 2 (two) times  daily with a meal.    Dispense:  60 tablet    Refill:  6    Dose increase  . tiZANidine (ZANAFLEX) 4 MG tablet    Sig: Take 1 tablet (4 mg total) by mouth every 8 (eight) hours as needed for muscle spasms.    Dispense:  30 tablet    Refill:  1    Follow-up: Return in about 3 months (around 06/05/2020) for Chronic disease management.       Charlott Rakes, MD, FAAFP. [facility] [place], [place]   03/08/2020, 5:18 PM

## 2020-03-10 LAB — CMP14+EGFR
ALT: 11 IU/L (ref 0–44)
AST: 20 IU/L (ref 0–40)
Albumin/Globulin Ratio: 1.5 (ref 1.2–2.2)
Albumin: 4.4 g/dL (ref 4.0–5.0)
Alkaline Phosphatase: 80 IU/L (ref 44–121)
BUN/Creatinine Ratio: 15 (ref 9–20)
BUN: 12 mg/dL (ref 6–24)
Bilirubin Total: 0.4 mg/dL (ref 0.0–1.2)
CO2: 25 mmol/L (ref 20–29)
Calcium: 9.5 mg/dL (ref 8.7–10.2)
Chloride: 101 mmol/L (ref 96–106)
Creatinine, Ser: 0.81 mg/dL (ref 0.76–1.27)
GFR calc Af Amer: 122 mL/min/{1.73_m2} (ref >59)
GFR calc non Af Amer: 106 mL/min/{1.73_m2} (ref >59)
Globulin, Total: 2.9 g/dL (ref 1.5–4.5)
Glucose: 95 mg/dL (ref 65–99)
Potassium: 3.8 mmol/L (ref 3.5–5.2)
Sodium: 141 mmol/L (ref 134–144)
Total Protein: 7.3 g/dL (ref 6.0–8.5)

## 2020-03-10 LAB — HCV RNA QUANT RFLX ULTRA OR GENOTYP: HCV Quant Baseline: NOT DETECTED IU/mL

## 2020-03-11 ENCOUNTER — Telehealth: Payer: Self-pay

## 2020-03-11 NOTE — Telephone Encounter (Signed)
-----   Message from Enobong Newlin, MD sent at 03/10/2020  1:09 PM EST ----- Please inform the patient that labs are normal. Thank you. 

## 2020-03-11 NOTE — Telephone Encounter (Signed)
Patient has viewed results via mychart on 03/10/20 

## 2020-03-18 MED FILL — ALLOPURINOL 300 MG TAB: 300 | 30 days supply | Qty: 60 | Fill #1

## 2020-03-18 MED FILL — MELOXICAM 7.5 MG TABLET: 7.5 | 30 days supply | Qty: 30 | Fill #1

## 2020-03-18 NOTE — Telephone Encounter (Signed)
Pt seen results on mychart but doesn't understand them and has asked for a call to go over results / please advise

## 2020-03-18 NOTE — Telephone Encounter (Signed)
Pt was called and informed of normal lab results. 

## 2020-03-22 ENCOUNTER — Telehealth: Payer: Self-pay | Admitting: Orthopaedic Surgery

## 2020-03-22 NOTE — Telephone Encounter (Signed)
No lifting more than 20 lbs.  No standing for more than 1 hr at a time.

## 2020-03-22 NOTE — Telephone Encounter (Signed)
Pt called stating his job is needing a more detailed note of the pt's limitations; how much he can lift and for how long, how long he can stand, etc. Pt would like a CB when this is ready for pickup.   [phone]

## 2020-03-22 NOTE — Telephone Encounter (Signed)
2 months

## 2020-03-22 NOTE — Telephone Encounter (Signed)
Note placed up front for patient. I called patient and advised.

## 2020-03-22 NOTE — Telephone Encounter (Signed)
Please advise on restrictions. For how long?

## 2020-03-22 NOTE — Telephone Encounter (Signed)
Can you advise on how long for restrictions? Per last note, working on target weight and will ROV. Restrictions until then? Last note in chart was for one month and that was in December. Thanks.

## 2020-04-12 ENCOUNTER — Other Ambulatory Visit (HOSPITAL_COMMUNITY): Payer: Self-pay | Admitting: Student

## 2020-04-12 ENCOUNTER — Ambulatory Visit (INDEPENDENT_AMBULATORY_CARE_PROVIDER_SITE_OTHER): Payer: BC Managed Care – PPO

## 2020-04-12 ENCOUNTER — Other Ambulatory Visit: Payer: Self-pay

## 2020-04-12 ENCOUNTER — Encounter (HOSPITAL_COMMUNITY): Payer: Self-pay

## 2020-04-12 ENCOUNTER — Ambulatory Visit (HOSPITAL_COMMUNITY)
Admission: EM | Admit: 2020-04-12 | Discharge: 2020-04-12 | Disposition: A | Discharge status: Home / Self Care | Payer: BC Managed Care – PPO | Attending: Student | Admitting: Student

## 2020-04-12 DIAGNOSIS — I10 Essential (primary) hypertension: Secondary | ICD-10-CM

## 2020-04-12 DIAGNOSIS — X500XXA Overexertion from strenuous movement or load, initial encounter: Secondary | ICD-10-CM | POA: Diagnosis not present

## 2020-04-12 DIAGNOSIS — M5432 Sciatica, left side: Secondary | ICD-10-CM | POA: Diagnosis not present

## 2020-04-12 DIAGNOSIS — M1611 Unilateral primary osteoarthritis, right hip: Secondary | ICD-10-CM

## 2020-04-12 DIAGNOSIS — S39012A Strain of muscle, fascia and tendon of lower back, initial encounter: Secondary | ICD-10-CM | POA: Diagnosis not present

## 2020-04-12 DIAGNOSIS — M5431 Sciatica, right side: Secondary | ICD-10-CM

## 2020-04-12 DIAGNOSIS — M545 Low back pain, unspecified: Secondary | ICD-10-CM | POA: Diagnosis not present

## 2020-04-12 MED ORDER — NAPROXEN 500 MG PO TABS: 500.0000 mg | ORAL_TABLET | Freq: Two times a day (BID) | ORAL | 0 refills | Status: DC

## 2020-04-12 MED ORDER — TIZANIDINE HCL 2 MG PO CAPS: 2.0000 mg | ORAL_CAPSULE | Freq: Three times a day (TID) | ORAL | 0 refills | Status: DC

## 2020-04-12 MED ORDER — PREDNISONE 20 MG PO TABS: 40.0000 mg | ORAL_TABLET | Freq: Every day | ORAL | 0 refills | Status: DC

## 2020-04-12 MED FILL — NAPROXEN 500 MG TABLET: 500 | 15 days supply | Qty: 30 | Fill #0

## 2020-04-12 MED FILL — predniSONE 20 MG TABS: 20 | 5 days supply | Qty: 10 | Fill #0

## 2020-04-12 MED FILL — tiZANidine HCL 2 MG TABS: 2 | 7 days supply | Qty: 21 | Fill #0

## 2020-04-12 NOTE — ED Triage Notes (Signed)
Pt in with c/o lower back pain that occurred this morning while he was at work  States he twisted wrong

## 2020-04-12 NOTE — ED Provider Notes (Signed)
[facility]    CSN: [id number] Arrival date & time: 04/12/20  [id number]      History   Chief Complaint Chief Complaint  Patient presents with  . Back Pain    HPI Daniel [name] is a 50 y.o. male presenting for back pain x1 hour. History asthma, hypertension, hypomagnesemia, alcohol dependence.  States he was lifting a heavy object at work and twisting at the same time, immediately felt lower back pain and pain radiating down the back of both legs.  Came straight here.  Denies weakness or sensation changes in arms or legs.  Denies saddle anesthesia.  Denies bowel or bladder changes, including constipation and urinary decreased stream.  Denies chest pain, shortness of breath, headaches, vision changes, dizziness.  Denies pain elsewhere. Denies history of back issues in the past. Does have right hip pain/ osteoarthritis at baseline but denies changes in this. Followed by ortho for this. Working on weight loss. Denies calf pain or swelling.  HPI  Past Medical History:  Diagnosis Date  . Asthma   . Asthma   . Hypertension     Patient Active Problem List   Diagnosis Date Noted  . Hypomagnesemia 03/03/2018  . HTN (hypertension) 03/02/2018  . Alcoholic hepatitis without ascites 03/02/2018  . Chronic anemia 03/02/2018  . Alcohol abuse 03/01/2018  . Tobacco abuse 03/01/2018  . PE (pulmonary thromboembolism) (HCC) 03/01/2018  . Acute respiratory failure with hypoxia (HCC) 03/01/2018  . Thrombocytopenia (HCC) 03/01/2018  . Asthma 03/01/2018  . Exacerbation of asthma   . Alcohol dependence (HCC) 09/05/2012  . 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 Dental procedure         Home Medications    Prior to Admission medications   Medication Sig Start Date End Date Taking? Authorizing Provider  naproxen (NAPROSYN) 500 MG tablet Take 1 tablet (500 mg total) by mouth 2  (two) times daily. 04/12/20  Yes Rhys MartiniGraham, Javana Schey E, PA-C  predniSONE (DELTASONE) 20 MG tablet Take 2 tablets (40 mg total) by mouth daily for 5 days. 04/12/20 04/17/20 Yes Rhys MartiniGraham, Ebonie Westerlund E, PA-C  tizanidine (ZANAFLEX) 2 MG capsule Take 1 capsule (2 mg total) by mouth 3 (three) times daily. 04/12/20  Yes Rhys MartiniGraham, Nour Rodrigues E, PA-C  acetaminophen-codeine (TYLENOL #3) 300-30 MG tablet Take 1-2 tablets by mouth daily as needed for moderate pain. 02/24/20   Tarry KosXu, Naiping M, MD  albuterol (VENTOLIN HFA) 108 (90 Base) MCG/ACT inhaler Inhale 1 puff into the lungs every 6 (six) hours as needed for wheezing or shortness of breath. 02/12/20   Hoy RegisterNewlin, Enobong, MD  allopurinol (ZYLOPRIM) 300 MG tablet Take 1 tablet (300 mg total) by mouth 2 (two) times daily. 02/12/20   Hoy RegisterNewlin, Enobong, MD  benzonatate (TESSALON) 200 MG capsule Take 1 capsule (200 mg total) by mouth 2 (two) times daily as needed for cough. Patient not taking: Reported on 03/08/2020 01/05/20   Eustace MooreNelson, Yvonne Sue, MD  carvedilol (COREG) 6.25 MG tablet Take 1 tablet (6.25 mg total) by mouth 2 (two) times daily with a meal. 03/08/20   Hoy RegisterNewlin, Enobong, MD  folic acid (FOLVITE) 1 MG tablet Take 1 tablet (1 mg total) by mouth daily. Patient not taking: Reported on 03/08/2020 09/30/19   Hoy RegisterNewlin, Enobong, MD  HYDROcodone-acetaminophen (NORCO) 5-325 MG tablet Take 1-2 tablets by mouth daily as needed. 11/19/19   Cristie HemStanbery, Mary L, PA-C  hydrocortisone 1 % ointment  Apply 1 application topically 2 (two) times daily. 05/28/19   Hoy Register, MD  meloxicam (MOBIC) 7.5 MG tablet Take 1 tablet (7.5 mg total) by mouth daily. 02/12/20   Hoy Register, MD  Multiple Vitamin (MULTIVITAMIN WITH MINERALS) TABS tablet Take 1 tablet by mouth daily. Patient not taking: No sig reported 03/05/18   Roberto Scales D, MD  potassium chloride SA (KLOR-CON) 20 MEQ tablet Take 1 tablet (20 mEq total) by mouth daily. Patient not taking: No sig reported 12/17/18   Hoy Register, MD  thiamine 100 MG  tablet Take 1 tablet (100 mg total) by mouth daily. 05/28/19   Hoy Register, MD  colchicine 0.6 MG tablet Take 2 tabs (1.2mg ) at the onset of a Gout flare, may repeat 1 tab (0.6mg ) after 2 hours if symptoms persist 09/30/19 04/12/20  Hoy Register, MD    Family History Family History  Problem Relation Age of Onset  . Heart attack Mother     Social History Social History   Tobacco Use  . Smoking status: Current Some Day Smoker    Packs/day: 0.50    Types: Cigarettes  . Smokeless tobacco: Never Used  Vaping Use  . Vaping Use: Never used  Substance Use Topics  . Alcohol use: Not Currently  . Drug use: Not Currently     Allergies   Patient has no known allergies.   Review of Systems Review of Systems  Musculoskeletal: Positive for back pain.  All other systems reviewed and are negative.    Physical Exam Triage Vital Signs ED Triage Vitals  Enc Vitals Group     BP      Pulse      Resp      Temp      Temp src      SpO2      Weight      Height      Head Circumference      Peak Flow      Pain Score      Pain Loc      Pain Edu?      Excl. in GC?    No data found.  Updated Vital Signs BP (!) 181/113   Pulse 100   Temp 98.8 F (37.1 C)   Resp 19   SpO2 98%   Visual Acuity Right Eye Distance:   Left Eye Distance:   Bilateral Distance:    Right Eye Near:   Left Eye Near:    Bilateral Near:     Physical Exam Vitals reviewed.  Constitutional:      General: He is not in acute distress.    Appearance: Normal appearance. He is not ill-appearing.  HENT:     Head: Normocephalic and atraumatic.  Eyes:     Extraocular Movements: Extraocular movements intact.     Pupils: Pupils are equal, round, and reactive to light.  Cardiovascular:     Rate and Rhythm: Normal rate and regular rhythm.     Heart sounds: Normal heart sounds.     Comments: Negative homan sign bilaterally Pulmonary:     Effort: Pulmonary effort is normal.     Breath sounds: Normal  breath sounds and air entry.  Abdominal:     Palpations: Abdomen is soft.     Tenderness: There is no abdominal tenderness. There is no right CVA tenderness, left CVA tenderness, guarding or rebound.     Comments: No bowel or bladder incontinence.  Musculoskeletal:     Cervical back: Normal  range of motion. No swelling, deformity, signs of trauma, rigidity, spasms, tenderness, bony tenderness or crepitus. No pain with movement.     Thoracic back: No swelling, deformity, signs of trauma, spasms, tenderness or bony tenderness. Normal range of motion. No scoliosis.     Lumbar back: Spasms, tenderness and bony tenderness present. No swelling, deformity or signs of trauma. Normal range of motion. Positive right straight leg raise test and positive left straight leg raise test. No scoliosis.     Right lower leg: No edema.     Left lower leg: No edema.     Comments: Strength 5/5 in UEs and LEs, sensation intact. Lumbar spinous tenderness, no deformity or step-off.  Bilateral paraspinous muscle tenderness.  Pain elicited with extension and flexion of lumbar spine.  Gait intact but with pain. ROM hips intact but with pain. R hip tenderness to palpation over humeral head, pain elicited with flexion R hip. No pedal edema, calf swelling, pain.   No other deformity, tenderness, ecchymosis, abrasion.  Neurological:     General: No focal deficit present.     Mental Status: He is alert.     Cranial Nerves: No cranial nerve deficit.     Comments: Strength 5/5 in UEs and LEs. Gait normal. Sensation intact in UEs and LEs.   Psychiatric:        Mood and Affect: Mood normal.        Behavior: Behavior normal.        Thought Content: Thought content normal.        Judgment: Judgment normal.      UC Treatments / Results  Labs (all labs ordered are listed, but only abnormal results are displayed) Labs Reviewed - No data to display  EKG   Radiology DG Lumbar Spine Complete  Result Date:  04/12/2020 CLINICAL DATA:  Low back pain following heavy lifting, initial encounter EXAM: LUMBAR SPINE - COMPLETE 4+ VIEW COMPARISON:  10/21/2019 FINDINGS: Vertebral body height is well maintained. No compression deformity is noted. Mild osteophytic changes are seen. No pars defects are noted. Mild facet hypertrophic changes are seen. No soft tissue abnormality is noted. IMPRESSION: Mild degenerative change without acute abnormality. Electronically Signed   By: Alcide Clever M.D.   On: 04/12/2020 09:35    Procedures Procedures (including critical care time)  Medications Ordered in UC Medications - No data to display  Initial Impression / Assessment and Plan / UC Course  I have reviewed the triage vital signs and the nursing notes.  Pertinent labs & imaging results that were available during my care of the patient were reviewed by me and considered in my medical decision making (see chart for details).     This patient is a 50 year old presenting with lumbar strain. Today he is hypertensive but afebrile, nontachycardic, nontachypneic, oxygenating well on room air.  Low concern for rhabdo given isolated nature of pain, no weakness in UEs or LEs, no dark urine/urinary changes, no increase in heavy lifting.   Xray lumbar spine Mild degenerative change without acute abnormality. Films interpreted by myself and radiologist.  Plan to treat with prednisone, zanaflex, naproxen as below. He does not have diabetes or kidney disease.  Patient with chronic hip OA, R>L, followed by ortho for this. Denies changes in this today. Continue working on weight loss.   BP initially 181/113, same on repeat. Hasn't taken BP medications yet today. Denies chest pain, shortness of breath, dizziness, headaches, vision changes. Rec rechecking this at pharmacy, f/u with  PCP if this continues to be >140/90. ED return precautions discussed.   Return precautions discussed.   This chart was dictated using voice  recognition software, Dragon. Despite the best efforts of this provider to proofread and correct errors, errors may still occur which can change documentation meaning.   Final Clinical Impressions(s) / UC Diagnoses   Final diagnoses:  Essential hypertension  Strain of lumbar region, initial encounter  Bilateral sciatica  Primary osteoarthritis of right hip     Discharge Instructions     -Take the steroid-prednisone (Deltasone), 2 pills taken together in the morning for 5 days.  The steroid will help reduce the inflammation in your back that is causing so much pain.  This can give you energy, so take in the morning. -Also use the muscle relaxer-Zanaflex (tizanidine), up to 3 times daily for muscular spasm.  This can cause drowsiness, so take when you not to drive or operate machinery. -I also sent a pain reliever and anti-inflammatory called naproxen which you can take twice daily for pain.  Take this with food, like with breakfast and dinner. Avoid taking this with other NSAIDs like ibuprofen or advil. -You can also use a heating pad, warm baths, etc. to keep your back from becoming stiff.  Make sure to walk around the house every few hours to keep your back from becoming stiff. -I provided a work note until Wednesday.  Please only return to work if you are not having pain anymore.  If you are still having pain on Wednesday, you can return to this clinic or your primary care or your orthopedist for further evaluation and management. -Seek additional medical attention if you experience new symptoms like changes in bowel or bladder function, weakness in legs or arms, numbness on your inner thighs, etc. -Please check your blood pressure at home or at the pharmacy. If this continues to be >140/90, follow-up with your primary care provider for further blood pressure management/ medication titration. If you develop chest pain, shortness of breath, vision changes, the worst headache of your life- head  straight to the ED or call 911.     ED Prescriptions    Medication Sig Dispense Auth. Provider   predniSONE (DELTASONE) 20 MG tablet Take 2 tablets (40 mg total) by mouth daily for 5 days. 10 tablet Rhys Martini, PA-C   tizanidine (ZANAFLEX) 2 MG capsule Take 1 capsule (2 mg total) by mouth 3 (three) times daily. 21 capsule Ignacia Bayley E, PA-C   naproxen (NAPROSYN) 500 MG tablet Take 1 tablet (500 mg total) by mouth 2 (two) times daily. 30 tablet Rhys Martini, PA-C     I have reviewed the PDMP during this encounter.   Rhys Martini, PA-C 04/12/20 1045

## 2020-04-12 NOTE — Discharge Instructions (Signed)
-  Take the steroid-prednisone (Deltasone), 2 pills taken together in the morning for 5 days.  The steroid will help reduce the inflammation in your back that is causing so much pain.  This can give you energy, so take in the morning. -Also use the muscle relaxer-Zanaflex (tizanidine), up to 3 times daily for muscular spasm.  This can cause drowsiness, so take when you not to drive or operate machinery. -I also sent a pain reliever and anti-inflammatory called naproxen which you can take twice daily for pain.  Take this with food, like with breakfast and dinner. Avoid taking this with other NSAIDs like ibuprofen or advil. -You can also use a heating pad, warm baths, etc. to keep your back from becoming stiff.  Make sure to walk around the house every few hours to keep your back from becoming stiff. -I provided a work note until Wednesday.  Please only return to work if you are not having pain anymore.  If you are still having pain on Wednesday, you can return to this clinic or your primary care or your orthopedist for further evaluation and management. -Seek additional medical attention if you experience new symptoms like changes in bowel or bladder function, weakness in legs or arms, numbness on your inner thighs, etc. -Please check your blood pressure at home or at the pharmacy. If this continues to be >140/90, follow-up with your primary care provider for further blood pressure management/ medication titration. If you develop chest pain, shortness of breath, vision changes, the worst headache of your life- head straight to the ED or call 911.

## 2020-04-19 MED FILL — ALLOPURINOL 300 MG TAB: 300 | 30 days supply | Qty: 60 | Fill #2

## 2020-04-19 MED FILL — MELOXICAM 7.5 MG TABLET: 7.5 | 30 days supply | Qty: 30 | Fill #2

## 2020-04-27 ENCOUNTER — Ambulatory Visit (INDEPENDENT_AMBULATORY_CARE_PROVIDER_SITE_OTHER): Payer: BC Managed Care – PPO | Admitting: Orthopaedic Surgery

## 2020-04-27 ENCOUNTER — Encounter: Payer: Self-pay | Admitting: Orthopaedic Surgery

## 2020-04-27 VITALS — Ht 68.0 in | Wt 291.4 lb

## 2020-04-27 DIAGNOSIS — M1611 Unilateral primary osteoarthritis, right hip: Secondary | ICD-10-CM

## 2020-04-27 DIAGNOSIS — M1612 Unilateral primary osteoarthritis, left hip: Secondary | ICD-10-CM

## 2020-04-27 DIAGNOSIS — Z6841 Body Mass Index (BMI) 40.0 and over, adult: Secondary | ICD-10-CM | POA: Diagnosis not present

## 2020-04-27 NOTE — Progress Notes (Signed)
Office Visit Note   Patient: Daniel [name]           Date of Birth: December 13, 1970           MRN: [id number] Visit Date: 04/27/2020              Requested by: Hoy Register, MD [address] [place],  [place] [address] PCP: Hoy Register, MD   Assessment & Plan: Visit Diagnoses:  1. Primary osteoarthritis of right hip   2. Primary osteoarthritis of left hip   3. Body mass index 40.0-44.9, adult (HCC)   4. Morbid obesity (HCC)     Plan: Impression is end-stage bilateral hip DJD with recent exacerbation.  Out of work note for 2 weeks provided today.  He has had no relief from cortisone injections or medications.  He understands the importance of losing the weight so that he can be a surgical candidate.  We will see him back as needed.  Follow-Up Instructions: No follow-ups on file.   Orders:  No orders of the defined types were placed in this encounter.  No orders of the defined types were placed in this encounter.     Procedures: No procedures performed   Clinical Data: No additional findings.   Subjective: Chief Complaint  Patient presents with  . Right Hip - Pain  . Left Hip - Pain    [name] returns today today for severe bilateral hip pain.  He had to stop working a few days ago and needs out of work note.  There has been no improvement in his weight.   Review of Systems  Constitutional: Negative.   All other systems reviewed and are negative.    Objective: Vital Signs: Ht 5\' 8"  (1.727 m)   Wt 291 lb 6.4 oz (132.2 kg)   BMI 44.31 kg/m   Physical Exam Vitals and nursing note reviewed.  Constitutional:      Appearance: He is well-developed.  Pulmonary:     Effort: Pulmonary effort is normal.  Abdominal:     Palpations: Abdomen is soft.  Skin:    General: Skin is warm.  Neurological:     Mental Status: He is alert and oriented to person, place, and time.  Psychiatric:        Behavior: Behavior normal.        Thought Content: Thought  content normal.        Judgment: Judgment normal.     Ortho Exam Hip exams are unchanged. Specialty Comments:  No specialty comments available.  Imaging: No results found.   PMFS History: Patient Active Problem List   Diagnosis Date Noted  . Hypomagnesemia 03/03/2018  . HTN (hypertension) 03/02/2018  . Alcoholic hepatitis without ascites 03/02/2018  . Chronic anemia 03/02/2018  . Alcohol abuse 03/01/2018  . Tobacco abuse 03/01/2018  . PE (pulmonary thromboembolism) (HCC) 03/01/2018  . Acute respiratory failure with hypoxia (HCC) 03/01/2018  . Thrombocytopenia (HCC) 03/01/2018  . Asthma 03/01/2018  . Exacerbation of asthma   . Alcohol dependence (HCC) 09/05/2012  . 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 Asthma   . Asthma   . Hypertension     Family History  Problem Relation Age of Onset  . Heart attack Mother     Past Surgical History:  Procedure Laterality Date  . Dental procedure     Social History   Occupational History  . Not on  file  Tobacco Use  . Smoking status: Current Some Day Smoker    Packs/day: 0.50    Types: Cigarettes  . Smokeless tobacco: Never Used  Vaping Use  . Vaping Use: Never used  Substance and Sexual Activity  . Alcohol use: Not Currently  . Drug use: Not Currently  . Sexual activity: Yes    Birth control/protection: None    Comment: Married

## 2020-04-28 ENCOUNTER — Encounter: Payer: Self-pay | Admitting: Dietician

## 2020-04-28 ENCOUNTER — Other Ambulatory Visit: Payer: Self-pay

## 2020-04-28 ENCOUNTER — Encounter: Payer: BC Managed Care – PPO | Attending: Family Medicine | Admitting: Dietician

## 2020-04-28 VITALS — Ht 68.0 in | Wt 291.8 lb

## 2020-04-28 DIAGNOSIS — E669 Obesity, unspecified: Secondary | ICD-10-CM | POA: Diagnosis not present

## 2020-04-28 NOTE — Progress Notes (Signed)
Medical Nutrition Therapy  Appointment Start time:  1020  Appointment End time:  1125  Primary concerns today: Weight Loss  Referral diagnosis: E66.01 Morbid Obesity Preferred learning style: No preference indicated Learning readiness: Ready   NUTRITION ASSESSMENT   Anthropometrics  Wt: 291.8 lbs UBW: 235 (reported by pt) Ht: 5'8" Body mass index is 44.37 kg/m.  Clinical Medical Hx: HTN, Alcoholic hepatitis, alcohol abuse, hip/low back pain Medications: Carvedilol, allopurinol Labs:  AST - 118 (high) ALT - 89 - (high) Albumin - 5.2 (high) CO2 - 19 (low) Notable Signs/Symptoms: HTN (181/113)   Lifestyle & Dietary Hx Pt is motivated to lose weight. Pt reports wanting to lose weight to be able to get hip replacement surgery. Pt states both hips have been giving them pain for about a year. Pt reports gaining about 50-60 pounds after stopping drinking about a year ago.  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 Pt reports recently tweaked their back at work.Pt reports being out of work for two weeks as a result of injury, but is feeling better now. Pt reports not wanting to eat heavy in the morning. Pt reports having fridge and microwave at work. Pt reports mostly drinking Gatorade Zero and bottled water. Pt reports wife makes fired chicken, pork chops, lots of vegetable, potatoes or macaroni for dinner. Pt states all the meats their wife cook are usually fried.  Typical meal schedule: 3 meals a day Breakfast: Snack cake Lunch: Microwave meal Dinner: Home cooked meal   Estimated daily fluid intake: ~128 oz Supplements: MVU Sleep: Sleeps well, has to urinate due to HTN meds Stress / self-care: Stressed due to wanting to lose weight, get hip replacement Current average weekly physical activity: ADLs   24-Hr Dietary Recall First Meal: 3 Waffles, 2 pieces of sausage, scrambled eggs, orange juice Snack: none Second Meal: none Snack:  none Third Meal: Pork BBQ sandwich, fries, Sprite Snack: none Beverages: OJ, SPRITE   NUTRITION DIAGNOSIS  NB-1.1 Food and nutrition-related knowledge deficit As related to obesity.  As evidenced by BMI of 44.37 kg/m2, consumption of high fat and fried foods, and history of alcohol abuse.   NUTRITION INTERVENTION  Nutrition education (E-1) on the following topics:  . Educated patient on the balanced plate eating model. Recommended lunch and dinner be 1/2 non-starchy vegetables, 1/4 starches, and 1/4 protein. Recommended breakfast be a balance of starch and protein with a piece of fruit. Discussed with patient the importance of working towards hitting the proportions of the balanced plate consistently. Counseled patient on ways to begin recognizing each of the food groups from the balanced plate in their own meals, and how close they are to fitting the recommended proportions of the balanced plate. Educated patient on the nutritional value of each food group on the balanced plate model.  . Educated patient on the relationship between dietary sodium intake and hypertension. Recommended >2,000 mg of sodium per day. Educated patient on common sources of high sodium foods including packaged/processed foods, deli meats, fast foods, pickled food, sports drinks, and canned foods. Educated patient on the combined effect of hypertension and elevated cholesterol on cardiovascular health. Educated patient on the positive impact of physical activity in lowering blood pressure and improving cardiovascular health.    Handouts Provided Include   Balanced Plate  Balanced Plate Food List  Low Sodium Seasonings Nutrition Care Manual  Learning Style & Readiness for Change Teaching method utilized: Visual & Auditory  Demonstrated degree of understanding via: Teach Back  Barriers to learning/adherence to lifestyle change: Limited ambulation  Goals Established by Pt  Continue eating three meals a day, about  5-6 hours apart!  Begin to build your meals using the proportions of the Balanced Plate.  First, select your carb/starch choices for the meal  Next, select your source of protein to pair with your carb/starch.  Finally, complete the remaining half of your meal with a variety of non-starchy vegetables.  Consider looking into an air fryer to lower your fat intake  Begin to read nutrition labels while grocery shopping!   Look for lower sodium options (under 140mg  per serving) and low saturated fat items (under 5% per serving)  Look into "arm chair exercises" on YouTube. You can purchase a set of resistance bands for very little money, or use canned goods as weights. Every bit helps!  Choose 2% milk instead of whole milk, and look for reduced fat american cheese.  Work towards lowering the amount of fried foods you have each week. Aim to have it 1 time less each week for this month.  Try to take leftovers to work for lunch at least once a week!   MONITORING & EVALUATION Dietary intake, weekly physical activity, and weight in 2 months.  Next Steps  Patient is to follow up with RDN.

## 2020-04-28 NOTE — Patient Instructions (Addendum)
Continue eating three meals a day, about 5-6 hours apart!  Begin to build your meals using the proportions of the Balanced Plate. . First, select your carb/starch choices for the meal . Next, select your source of protein to pair with your carb/starch. . Finally, complete the remaining half of your meal with a variety of non-starchy vegetables.  Consider looking into an air fryer to lower your fat intake  Begin to read nutrition labels while grocery shopping!  Look for lower sodium options (under 140mg  per serving) and low saturated fat items (under 5% per serving)  Look into "arm chair exercises" on YouTube. You can purchase a set of resistance bands for very little money, or use canned goods as weights. Every bit helps!  Choose 2% milk instead of whole milk, and look for reduced fat american cheese.  Work towards lowering the amount of fried foods you have each week. Aim to have it 1 time less each week for this month. Try to take leftovers to work for lunch at least once a week!

## 2020-04-29 ENCOUNTER — Telehealth: Payer: Self-pay | Admitting: Orthopaedic Surgery

## 2020-04-29 NOTE — Telephone Encounter (Signed)
[employer] forms received. Sent to [employer] 

## 2020-05-12 ENCOUNTER — Ambulatory Visit: Payer: BC Managed Care – PPO | Admitting: Dietician

## 2020-05-13 DIAGNOSIS — M25552 Pain in left hip: Secondary | ICD-10-CM | POA: Diagnosis not present

## 2020-05-13 DIAGNOSIS — M25551 Pain in right hip: Secondary | ICD-10-CM | POA: Diagnosis not present

## 2020-05-27 DIAGNOSIS — M25551 Pain in right hip: Secondary | ICD-10-CM | POA: Diagnosis not present

## 2020-05-27 DIAGNOSIS — M25552 Pain in left hip: Secondary | ICD-10-CM | POA: Diagnosis not present

## 2020-05-28 DIAGNOSIS — G894 Chronic pain syndrome: Secondary | ICD-10-CM | POA: Diagnosis not present

## 2020-05-31 ENCOUNTER — Other Ambulatory Visit: Payer: Self-pay

## 2020-05-31 MED FILL — Tizanidine HCl Tab 4 MG (Base Equivalent): ORAL | 10 days supply | Qty: 30 | Fill #0 | Status: AC

## 2020-05-31 MED FILL — Meloxicam Tab 7.5 MG: ORAL | 30 days supply | Qty: 30 | Fill #0 | Status: AC

## 2020-05-31 MED FILL — Carvedilol Tab 6.25 MG: ORAL | 30 days supply | Qty: 60 | Fill #0 | Status: AC

## 2020-06-10 ENCOUNTER — Other Ambulatory Visit: Payer: Self-pay

## 2020-06-10 MED FILL — Allopurinol Tab 300 MG: ORAL | 30 days supply | Qty: 60 | Fill #0 | Status: AC

## 2020-06-11 ENCOUNTER — Other Ambulatory Visit: Payer: Self-pay

## 2020-06-14 DIAGNOSIS — M1611 Unilateral primary osteoarthritis, right hip: Secondary | ICD-10-CM | POA: Diagnosis not present

## 2020-06-14 DIAGNOSIS — Z6841 Body Mass Index (BMI) 40.0 and over, adult: Secondary | ICD-10-CM | POA: Diagnosis not present

## 2020-06-14 DIAGNOSIS — M1612 Unilateral primary osteoarthritis, left hip: Secondary | ICD-10-CM | POA: Diagnosis not present

## 2020-06-25 DIAGNOSIS — M25552 Pain in left hip: Secondary | ICD-10-CM | POA: Diagnosis not present

## 2020-06-28 ENCOUNTER — Ambulatory Visit: Payer: BC Managed Care – PPO | Admitting: Dietician

## 2020-07-05 ENCOUNTER — Other Ambulatory Visit: Payer: Self-pay

## 2020-07-06 ENCOUNTER — Other Ambulatory Visit: Payer: Self-pay

## 2020-07-06 ENCOUNTER — Other Ambulatory Visit: Payer: Self-pay | Admitting: Pharmacist

## 2020-07-06 MED ORDER — ALBUTEROL SULFATE HFA 108 (90 BASE) MCG/ACT IN AERS
1.0000 | INHALATION_SPRAY | Freq: Four times a day (QID) | RESPIRATORY_TRACT | 0 refills | Status: DC | PRN
  Filled 2020-07-06: qty 8.5, 30d supply, fill #0

## 2020-07-06 MED FILL — Carvedilol Tab 6.25 MG: ORAL | 30 days supply | Qty: 60 | Fill #1 | Status: AC

## 2020-07-07 ENCOUNTER — Other Ambulatory Visit: Payer: Self-pay

## 2020-07-08 ENCOUNTER — Other Ambulatory Visit: Payer: Self-pay

## 2020-07-19 ENCOUNTER — Other Ambulatory Visit: Payer: Self-pay | Admitting: Family Medicine

## 2020-07-19 ENCOUNTER — Other Ambulatory Visit: Payer: Self-pay

## 2020-07-19 DIAGNOSIS — M1A00X0 Idiopathic chronic gout, unspecified site, without tophus (tophi): Secondary | ICD-10-CM

## 2020-07-19 MED ORDER — ALLOPURINOL 300 MG PO TABS
ORAL_TABLET | ORAL | 3 refills | Status: DC
  Filled 2020-07-19: qty 60, 30d supply, fill #0
  Filled 2020-08-19: qty 60, 30d supply, fill #1
  Filled 2020-09-27 – 2020-09-29 (×2): qty 60, 30d supply, fill #2

## 2020-07-20 ENCOUNTER — Other Ambulatory Visit: Payer: Self-pay

## 2020-08-06 DIAGNOSIS — Z6841 Body Mass Index (BMI) 40.0 and over, adult: Secondary | ICD-10-CM | POA: Diagnosis not present

## 2020-08-06 DIAGNOSIS — M25551 Pain in right hip: Secondary | ICD-10-CM | POA: Diagnosis not present

## 2020-08-09 ENCOUNTER — Other Ambulatory Visit: Payer: Self-pay

## 2020-08-09 MED FILL — Carvedilol Tab 6.25 MG: ORAL | 30 days supply | Qty: 60 | Fill #2 | Status: AC

## 2020-08-11 ENCOUNTER — Ambulatory Visit: Payer: BC Managed Care – PPO | Attending: Family Medicine | Admitting: Family Medicine

## 2020-08-11 ENCOUNTER — Other Ambulatory Visit: Payer: Self-pay

## 2020-08-11 ENCOUNTER — Encounter: Payer: Self-pay | Admitting: Family Medicine

## 2020-08-11 VITALS — BP 170/109 | HR 96 | Ht 68.0 in | Wt 306.0 lb

## 2020-08-11 DIAGNOSIS — I10 Essential (primary) hypertension: Secondary | ICD-10-CM

## 2020-08-11 DIAGNOSIS — Z23 Encounter for immunization: Secondary | ICD-10-CM | POA: Diagnosis not present

## 2020-08-11 DIAGNOSIS — Z6841 Body Mass Index (BMI) 40.0 and over, adult: Secondary | ICD-10-CM

## 2020-08-11 DIAGNOSIS — R7303 Prediabetes: Secondary | ICD-10-CM | POA: Diagnosis not present

## 2020-08-11 DIAGNOSIS — M160 Bilateral primary osteoarthritis of hip: Secondary | ICD-10-CM | POA: Diagnosis not present

## 2020-08-11 LAB — POCT GLYCOSYLATED HEMOGLOBIN (HGB A1C): Hemoglobin A1C: 6.4 % — AB (ref 4.0–5.6)

## 2020-08-11 MED ORDER — OZEMPIC (0.25 OR 0.5 MG/DOSE) 2 MG/1.5ML ~~LOC~~ SOPN
0.2500 mg | PEN_INJECTOR | SUBCUTANEOUS | 3 refills | Status: DC
  Filled 2020-08-11 – 2020-08-12 (×2): qty 1.5, 56d supply, fill #0
  Filled 2020-08-19 (×2): qty 1.5, 28d supply, fill #0

## 2020-08-11 MED ORDER — HYDROCHLOROTHIAZIDE 25 MG PO TABS
25.0000 mg | ORAL_TABLET | Freq: Every day | ORAL | 3 refills | Status: DC
  Filled 2020-08-11: qty 30, 30d supply, fill #0
  Filled 2020-09-06: qty 30, 30d supply, fill #1
  Filled 2020-10-11 – 2020-10-15 (×2): qty 30, 30d supply, fill #2

## 2020-08-11 NOTE — Progress Notes (Signed)
Feet swelling for 1 week.

## 2020-08-11 NOTE — Progress Notes (Addendum)
Subjective:  Patient ID: Daniel [name], male    DOB: Oct 02, 1970  Age: 50 y.o. MRN: [id number]  CC: Hypertension   HPI Daniel [name]  is a 50 year old male with a history of  Previous alcohol abuse (quit 1 year ago) hypertension, pulmonary embolism (in 02/2018; completed course of anticoagulation) here for follow-up visit.  Interval History: His BP has  been elevated and was about 180 systolic despite compliance with Coreg.  His Coreg dose was increased at his last visit.  He has pedal edema which is worse at the end of the day.  Denies presence of shortness of breath. His R hip continues to hurt and he now feels it in his left hip.  Unable to undergo hip surgery due to the fact that he has to lose weight which has been difficult. A1c performed today returned with a diagnosis of prediabetes with an A1c of 6.4.  Past Medical History:  Diagnosis Date   Asthma    Asthma    Hypertension     Past Surgical History:  Procedure Laterality Date   Dental procedure      Family History  Problem Relation Age of Onset   Heart attack Mother     No Known Allergies  Outpatient Medications Prior to Visit  Medication Sig Dispense Refill   albuterol (VENTOLIN HFA) 108 (90 Base) MCG/ACT inhaler Inhale 1 puff into the lungs every 6 (six) hours as needed for wheezing or shortness of breath. 8.5 g 0   allopurinol (ZYLOPRIM) 300 MG tablet TAKE 1 TABLET (300 MG TOTAL) BY MOUTH 2 (TWO) TIMES DAILY. 60 tablet 3   carvedilol (COREG) 6.25 MG tablet TAKE 1 TABLET (6.25 MG TOTAL) BY MOUTH 2 (TWO) TIMES DAILY WITH A MEAL. 60 tablet 6   celecoxib (CELEBREX) 200 MG capsule Celebrex 200 mg capsule  Take 1 capsule every day by oral route.     Oxycodone HCl 10 MG TABS oxycodone 10 mg tablet  Take 1 tablet 3 times a day by oral route as needed.     thiamine 100 MG tablet Take 1 tablet (100 mg total) by mouth daily. 30 tablet 3   acetaminophen-codeine (TYLENOL #3) 300-30 MG tablet TAKE 1-2 TABLETS BY MOUTH  DAILY AS NEEDED FOR MODERATE PAIN. (Patient not taking: Reported on 08/11/2020) 30 tablet 0   folic acid (FOLVITE) 1 MG tablet TAKE 1 TABLET (1 MG TOTAL) BY MOUTH DAILY. (Patient not taking: No sig reported) 30 tablet 3   HYDROcodone-acetaminophen (NORCO) 5-325 MG tablet Take 1-2 tablets by mouth daily as needed. (Patient not taking: Reported on 08/11/2020) 10 tablet 0   hydrocortisone 1 % ointment Apply 1 application topically 2 (two) times daily. (Patient not taking: Reported on 08/11/2020) 30 g 2   meloxicam (MOBIC) 7.5 MG tablet TAKE 1 TABLET (7.5 MG TOTAL) BY MOUTH DAILY. (Patient not taking: Reported on 08/11/2020) 90 tablet 1   Multiple Vitamin (MULTIVITAMIN WITH MINERALS) TABS tablet Take 1 tablet by mouth daily. (Patient not taking: No sig reported) 30 tablet 0   naproxen (NAPROSYN) 500 MG tablet TAKE 1 TABLET (500 MG TOTAL) BY MOUTH 2 (TWO) TIMES DAILY. (Patient not taking: Reported on 08/11/2020) 30 tablet 0   potassium chloride SA (KLOR-CON) 20 MEQ tablet Take 1 tablet (20 mEq total) by mouth daily. (Patient not taking: No sig reported) 30 tablet 6   benzonatate (TESSALON) 100 MG capsule TAKE 2 CAPSULE (200 MG TOTAL) BY MOUTH 2 (TWO) TIMES DAILY AS NEEDED FOR COUGH. 40  capsule 0   benzonatate (TESSALON) 200 MG capsule Take 1 capsule (200 mg total) by mouth 2 (two) times daily as needed for cough. (Patient not taking: Reported on 03/08/2020) 20 capsule 0   predniSONE (DELTASONE) 20 MG tablet TAKE 2 TABLETS (40 MG TOTAL) BY MOUTH DAILY FOR 5 DAYS. (Patient not taking: Reported on 08/11/2020) 10 tablet 0   tizanidine (ZANAFLEX) 2 MG capsule Take 1 capsule (2 mg total) by mouth 3 (three) times daily. 21 capsule 0   tiZANidine (ZANAFLEX) 2 MG tablet TAKE 1 CAPSULE (2 MG TOTAL) BY MOUTH 3 (THREE) TIMES DAILY. 21 tablet 0   tiZANidine (ZANAFLEX) 4 MG tablet TAKE 1 TABLET (4 MG TOTAL) BY MOUTH EVERY 8 (EIGHT) HOURS AS NEEDED FOR MUSCLE SPASMS. 30 tablet 1   No facility-administered medications prior to  visit.     ROS Review of Systems  Constitutional:  Negative for activity change and appetite change.  HENT:  Negative for sinus pressure and sore throat.   Eyes:  Negative for visual disturbance.  Respiratory:  Negative for cough, chest tightness and shortness of breath.   Cardiovascular:  Positive for leg swelling. Negative for chest pain.  Gastrointestinal:  Negative for abdominal distention, abdominal pain, constipation and diarrhea.  Endocrine: Negative.   Genitourinary:  Negative for dysuria.  Musculoskeletal:  Negative for joint swelling and myalgias.       See HPI  Skin:  Negative for rash.  Allergic/Immunologic: Negative.   Neurological:  Negative for weakness, light-headedness and numbness.  Psychiatric/Behavioral:  Negative for dysphoric mood and suicidal ideas.    Objective:  BP (!) 170/109 Comment: no medication this morning  Pulse 96   Ht 5\' 8"  (1.727 m)   Wt (!) 306 lb (138.8 kg)   SpO2 97%   BMI 46.53 kg/m   BP/Weight 08/11/2020 04/28/2020 04/27/2020  Systolic BP 170 - -  Diastolic BP 109 - -  Wt. (Lbs) 306 291.8 291.4  BMI 46.53 44.37 44.31  Some encounter information is confidential and restricted. Go to Review Flowsheets activity to see all data.      Physical Exam Constitutional:      Appearance: He is well-developed. He is obese.  Neck:     Vascular: No JVD.  Cardiovascular:     Rate and Rhythm: Normal rate.     Heart sounds: Normal heart sounds. No murmur heard. Pulmonary:     Effort: Pulmonary effort is normal.     Breath sounds: Normal breath sounds. No wheezing or rales.  Chest:     Chest wall: No tenderness.  Abdominal:     General: Bowel sounds are normal. There is no distension.     Palpations: Abdomen is soft. There is no mass.     Tenderness: There is no abdominal tenderness.  Musculoskeletal:        General: Normal range of motion.     Right lower leg: Edema (1+ non pitting) present.     Left lower leg: Edema (1+ non pitting)  present.     Comments: Tenderness on range of motion of bilateral hip joint  Neurological:     Mental Status: He is alert and oriented to person, place, and time.  Psychiatric:        Mood and Affect: Mood normal.    CMP Latest Ref Rng & Units 03/08/2020 05/28/2019 11/24/2018  Glucose 65 - 99 mg/dL 95 74 84  BUN 6 - 24 mg/dL 12 9 5(L)  Creatinine 4.540.76 - 1.27 mg/dL 0.980.81  0.85 0.97  Sodium 134 - 144 mmol/L 141 143 137  Potassium 3.5 - 5.2 mmol/L 3.8 4.4 3.8  Chloride 96 - 106 mmol/L 101 99 101  CO2 20 - 29 mmol/L 25 19(L) 24  Calcium 8.7 - 10.2 mg/dL 9.5 9.5 9.0  Total Protein 6.0 - 8.5 g/dL 7.3 8.2 -  Total Bilirubin 0.0 - 1.2 mg/dL 0.4 0.5 -  Alkaline Phos 44 - 121 IU/L 80 102 -  AST 0 - 40 IU/L 20 118(H) -  ALT 0 - 44 IU/L 11 89(H) -    Lipid Panel  No results found for: CHOL, TRIG, HDL, CHOLHDL, VLDL, LDLCALC, LDLDIRECT  CBC    Component Value Date/Time   WBC 7.9 05/28/2019 1622   WBC 10.0 11/24/2018 1242   RBC 4.49 05/28/2019 1622   RBC 4.43 11/24/2018 1242   HGB 15.0 05/28/2019 1622   HCT 43.2 05/28/2019 1622   PLT 306 05/28/2019 1622   MCV 96 05/28/2019 1622   MCH 33.4 (H) 05/28/2019 1622   MCH 32.1 11/24/2018 1242   MCHC 34.7 05/28/2019 1622   MCHC 34.2 11/24/2018 1242   RDW 15.5 (H) 05/28/2019 1622   LYMPHSABS 1.8 05/28/2019 1622   MONOABS 0.9 11/24/2018 1242   EOSABS 0.1 05/28/2019 1622   BASOSABS 0.1 05/28/2019 1622    Lab Results  Component Value Date   HGBA1C 6.4 (A) 08/11/2020    Assessment & Plan:  1. Essential hypertension Uncontrolled  Hydrochlorothiazide added to regimen due to pedal edema He will need a potassium check in 2 weeks as last potassium was 3.8 Schedule with clinical pharmacist in 2 weeks for follow-up of blood pressure If blood pressure is still above goal at clinical pharmacy visit consider addition of an ARB to augment his potassium Counseled on blood pressure goal of less than 130/80, low-sodium, DASH diet, medication compliance,  150 minutes of moderate intensity exercise per week. Discussed medication compliance, adverse effects. - hydrochlorothiazide (HYDRODIURIL) 25 MG tablet; Take 1 tablet (25 mg total) by mouth daily.  Dispense: 30 tablet; Refill: 3 - Lipid panel - Basic Metabolic Panel; Future  2. Prediabetes New diagnosis with A1c of 6.4 Will commence on GLP-1 which will be beneficial with regards to weight loss Clinical pharmacist called in to provide education Emphasized the need to comply with a diabetic diet and exercise.  Unfortunately exercise is limited due to pain in hips - POCT glycosylated hemoglobin (Hb A1C) - Semaglutide,0.25 or 0.5MG /DOS, (OZEMPIC, 0.25 OR 0.5 MG/DOSE,) 2 MG/1.5ML SOPN; Inject 0.25 mg into the skin once a week.  Dispense: 2 mL; Refill: 3  3. Bilateral primary osteoarthritis of hip Will need hip surgery but needs to lose 40 pounds Followed by orthopedic  4. Morbid obesity (HCC) Counseled on caloric restriction, exercise as tolerated Hopefully GLP-1 will be beneficial with regards to weight loss   He received the shingles vaccine today erroneously but only has asthma and hypertension which do not qualify for immunocompromise condition which would be an indication in people less than 50 years.  Nursing staff apologized to the patient and I had a discussion with the patient as well and assured him that if he noticed any adverse effects to reach out to the clinic.  He will receive his shingles vaccine when he turns 52. Meds ordered this encounter  Medications   hydrochlorothiazide (HYDRODIURIL) 25 MG tablet    Sig: Take 1 tablet (25 mg total) by mouth daily.    Dispense:  30 tablet  Refill:  3   Semaglutide,0.25 or 0.5MG /DOS, (OZEMPIC, 0.25 OR 0.5 MG/DOSE,) 2 MG/1.5ML SOPN    Sig: Inject 0.25 mg into the skin once a week.    Dispense:  2 mL    Refill:  3    Follow-up: Return in about 2 weeks (around 08/25/2020) for blood pressure evaluation with Franky Macho, 2 month nurse visit for  shingles vaccine.       Hoy Register, MD, FAAFP. [facility] [place], [place] [phone]   08/11/2020, 12:12 PM

## 2020-08-11 NOTE — Patient Instructions (Signed)
Prediabetes Eating Plan °Prediabetes is a condition that causes blood sugar (glucose) levels to be higher than normal. This increases the risk for developing type 2 diabetes (type 2 diabetes mellitus). Working with a health care provider or nutrition specialist (dietitian) to make diet and lifestyle changes can help prevent the onset of diabetes. These changes may help you: °Control your blood glucose levels. °Improve your cholesterol levels. °Manage your blood pressure. °What are tips for following this plan? °Reading food labels °Read food labels to check the amount of fat, salt (sodium), and sugar in prepackaged foods. Avoid foods that have: °Saturated fats. °Trans fats. °Added sugars. °Avoid foods that have more than 300 milligrams (mg) of sodium per serving. Limit your sodium intake to less than 2,300 mg each day. °Shopping °Avoid buying pre-made and processed foods. °Avoid buying drinks with added sugar. °Cooking °Cook with olive oil. Do not use butter, lard, or ghee. °Bake, broil, grill, steam, or boil foods. Avoid frying. °Meal planning ° °Work with your dietitian to create an eating plan that is right for you. This may include tracking how many calories you take in each day. Use a food diary, notebook, or mobile application to track what you eat at each meal. °Consider following a Mediterranean diet. This includes: °Eating several servings of fresh fruits and vegetables each day. °Eating fish at least twice a week. °Eating one serving each day of whole grains, beans, nuts, and seeds. °Using olive oil instead of other fats. °Limiting alcohol. °Limiting red meat. °Using nonfat or low-fat dairy products. °Consider following a plant-based diet. This includes dietary choices that focus on eating mostly vegetables and fruit, grains, beans, nuts, and seeds. °If you have high blood pressure, you may need to limit your sodium intake or follow a diet such as the DASH (Dietary Approaches to Stop Hypertension) eating  plan. The DASH diet aims to lower high blood pressure. °Lifestyle °Set weight loss goals with help from your health care team. It is recommended that most people with prediabetes lose 7% of their body weight. °Exercise for at least 30 minutes 5 or more days a week. °Attend a support group or seek support from a mental health counselor. °Take over-the-counter and prescription medicines only as told by your health care provider. °What foods are recommended? °Fruits °Berries. Bananas. Apples. Oranges. Grapes. Papaya. Mango. Pomegranate. Kiwi. Grapefruit. Cherries. °Vegetables °Lettuce. Spinach. Peas. Beets. Cauliflower. Cabbage. Broccoli. Carrots. Tomatoes. Squash. Eggplant. Herbs. Peppers. Onions. Cucumbers. Brussels sprouts. °Grains °Whole grains, such as whole-wheat or whole-grain breads, crackers, cereals, and pasta. Unsweetened oatmeal. Bulgur. Barley. Quinoa. Brown rice. Corn or whole-wheat flour tortillas or taco shells. °Meats and other proteins °Seafood. Poultry without skin. Lean cuts of pork and beef. Tofu. Eggs. Nuts. Beans. °Dairy °Low-fat or fat-free dairy products, such as yogurt, cottage cheese, and cheese. °Beverages °Water. Tea. Coffee. Sugar-free or diet soda. Seltzer water. Low-fat or nonfat milk. Milk alternatives, such as soy or almond milk. °Fats and oils °Olive oil. Canola oil. Sunflower oil. Grapeseed oil. Avocado. Walnuts. °Sweets and desserts °Sugar-free or low-fat pudding. Sugar-free or low-fat ice cream and other frozen treats. °Seasonings and condiments °Herbs. Sodium-free spices. Mustard. Relish. Low-salt, low-sugar ketchup. Low-salt, low-sugar barbecue sauce. Low-fat or fat-free mayonnaise. °The items listed above may not be a complete list of recommended foods and beverages. Contact a dietitian for more information. °What foods are not recommended? °Fruits °Fruits canned with syrup. °Vegetables °Canned vegetables. Frozen vegetables with butter or cream sauce. °Grains °Refined white  flour and flour   products, such as bread, pasta, snack foods, and cereals. °Meats and other proteins °Fatty cuts of meat. Poultry with skin. Breaded or fried meat. Processed meats. °Dairy °Full-fat yogurt, cheese, or milk. °Beverages °Sweetened drinks, such as iced tea and soda. °Fats and oils °Butter. Lard. Ghee. °Sweets and desserts °Baked goods, such as cake, cupcakes, pastries, cookies, and cheesecake. °Seasonings and condiments °Spice mixes with added salt. Ketchup. Barbecue sauce. Mayonnaise. °The items listed above may not be a complete list of foods and beverages that are not recommended. Contact a dietitian for more information. °Where to find more information °American Diabetes Association: www.diabetes.org °Summary °You may need to make diet and lifestyle changes to help prevent the onset of diabetes. These changes can help you control blood sugar, improve cholesterol levels, and manage blood pressure. °Set weight loss goals with help from your health care team. It is recommended that most people with prediabetes lose 7% of their body weight. °Consider following a Mediterranean diet. This includes eating plenty of fresh fruits and vegetables, whole grains, beans, nuts, seeds, fish, and low-fat dairy, and using olive oil instead of other fats. °This information is not intended to replace advice given to you by your health care provider. Make sure you discuss any questions you have with your health care provider. °Document Revised: 04/10/2019 Document Reviewed: 04/10/2019 °Elsevier Patient Education © 2022 Elsevier Inc. ° °

## 2020-08-12 ENCOUNTER — Other Ambulatory Visit: Payer: Self-pay

## 2020-08-12 LAB — LIPID PANEL
Chol/HDL Ratio: 3.8 ratio (ref 0.0–5.0)
Cholesterol, Total: 156 mg/dL (ref 100–199)
HDL: 41 mg/dL (ref >39)
LDL Chol Calc (NIH): 98 mg/dL (ref 0–99)
Triglycerides: 88 mg/dL (ref 0–149)
VLDL Cholesterol Cal: 17 mg/dL (ref 5–40)

## 2020-08-13 ENCOUNTER — Telehealth: Payer: Self-pay

## 2020-08-13 NOTE — Telephone Encounter (Signed)
-----   Message from Hoy Register, MD sent at 08/12/2020  8:37 AM EDT ----- Please inform the patient that labs are normal. Thank you.

## 2020-08-13 NOTE — Telephone Encounter (Signed)
Pt has viewed results via mychart. 

## 2020-08-17 ENCOUNTER — Other Ambulatory Visit: Payer: Self-pay

## 2020-08-19 ENCOUNTER — Telehealth: Payer: Self-pay | Admitting: Pharmacist

## 2020-08-19 ENCOUNTER — Other Ambulatory Visit: Payer: Self-pay

## 2020-08-19 NOTE — Telephone Encounter (Signed)
Received notice from our pharmacy the Ozempic is on backorder. We could get patient assistance with Victoza. Will route to provider to see if we can switch to Victoza.

## 2020-08-20 ENCOUNTER — Telehealth: Payer: Self-pay | Admitting: Family Medicine

## 2020-08-20 ENCOUNTER — Other Ambulatory Visit: Payer: Self-pay

## 2020-08-20 MED ORDER — TRULICITY 0.75 MG/0.5ML ~~LOC~~ SOAJ
0.7500 mg | SUBCUTANEOUS | 3 refills | Status: DC
  Filled 2020-08-20: qty 2, 28d supply, fill #0

## 2020-08-20 MED ORDER — OZEMPIC (0.25 OR 0.5 MG/DOSE) 2 MG/1.5ML ~~LOC~~ SOPN
0.5000 mg | PEN_INJECTOR | SUBCUTANEOUS | 6 refills | Status: DC
  Filled 2020-08-20: qty 1.5, 28d supply, fill #0
  Filled 2020-09-21: qty 1.5, 28d supply, fill #1

## 2020-08-20 NOTE — Telephone Encounter (Signed)
Pharmacy just confirmed that Ozempic is available.

## 2020-08-20 NOTE — Telephone Encounter (Signed)
Received message from pharmacy that Ozempic which was previously on backorder is now available.  Trulicity has been discontinued and Ozempic which was initially prescribed has been represcribed as indication was Prediabetes and Obesity.

## 2020-08-20 NOTE — Telephone Encounter (Signed)
Ozempic is on back order. Substituted with Trulicty.

## 2020-08-23 ENCOUNTER — Other Ambulatory Visit: Payer: Self-pay

## 2020-08-26 ENCOUNTER — Other Ambulatory Visit: Payer: Self-pay

## 2020-08-26 ENCOUNTER — Encounter: Payer: Self-pay | Admitting: Pharmacist

## 2020-08-26 ENCOUNTER — Ambulatory Visit: Payer: BC Managed Care – PPO | Attending: Family Medicine | Admitting: Pharmacist

## 2020-08-26 DIAGNOSIS — I10 Essential (primary) hypertension: Secondary | ICD-10-CM

## 2020-08-26 NOTE — Progress Notes (Addendum)
   S:    PCP: Dr. Alvis Lemmings  Patient arrives with pain in hips but otherwise in good spirits. Presents to the clinic for hypertension evaluation, counseling, and management. Patient was referred and last seen by Primary Care Provider on 08/11/20 where HCTZ 25 mg was added to his current regimen of carvedilol 6.25 mg BID.  Medication adherence: reports taking HCTZ daily at lunch but not taking carvedilol thinking that it was replaced by HCTZ  Current BP Medications include:  Carvedilol 6.25 mg BID (not taking); HCTZ 25 mg daily  Antihypertensives tried in the past include: lisinopril 10 mg (discontinued d/t dry cough)  Dietary habits include:  - reports not currently restricting sodium and consuming coffee sometimes Exercise habits include: - currently unable d/t to pain in hips Family / Social history:  FH:  - mother: MI SH:  - alcohol: previous hx of alcohol abuse (quit 1 yr ago) - smoking: cigarettes some days   O:   Vitals:   08/26/20 1010  BP: (!) 148/98  Pulse: (!) 114     Home BP readings: None availble  Last 3 Office BP readings: BP Readings from Last 3 Encounters:  08/26/20 (!) 148/98  08/11/20 (!) 170/109  04/12/20 (!) 181/113    BMET    Component Value Date/Time   NA 141 03/08/2020 1548   K 3.8 03/08/2020 1548   CL 101 03/08/2020 1548   CO2 25 03/08/2020 1548   GLUCOSE 95 03/08/2020 1548   GLUCOSE 84 11/24/2018 1242   BUN 12 03/08/2020 1548   CREATININE 0.81 03/08/2020 1548   CALCIUM 9.5 03/08/2020 1548   GFRNONAA 106 03/08/2020 1548   GFRAA 122 03/08/2020 1548    Renal function: CrCl cannot be calculated (Patient's most recent lab result is older than the maximum 21 days allowed.).  Clinical ASCVD: No  The 10-year ASCVD risk score Denman [name] DC Jr., et al., 2013) is: 17.1%   Values used to calculate the score:     Age: 39 years     Sex: Male     Is Non-Hispanic African American: Yes     Diabetic: No     Tobacco smoker: Yes     Systolic Blood  Pressure: 148 mmHg     Is BP treated: Yes     HDL Cholesterol: 41 mg/dL     Total Cholesterol: 156 mg/dL   A/P: Hypertension diagnosed around 2013. BP currently above goal d/t non-adherence to full regimen. BP Goal = < 130/80 mmHg.   -Restarted carvedilol. Instructed pt that HCTZ was not a replacement for carvedilol, but an addition to it.  -Counseled on lifestyle modifications for blood pressure control including reduced dietary sodium, increased exercise, adequate sleep.  Results reviewed and written information provided.   Total time in face-to-face counseling 30 minutes.   F/U Clinic Visit in 3 weeks with [facility].

## 2020-08-27 LAB — BASIC METABOLIC PANEL
BUN/Creatinine Ratio: 11 (ref 9–20)
BUN: 10 mg/dL (ref 6–24)
CO2: 24 mmol/L (ref 20–29)
Calcium: 10 mg/dL (ref 8.7–10.2)
Chloride: 93 mmol/L — ABNORMAL LOW (ref 96–106)
Creatinine, Ser: 0.87 mg/dL (ref 0.76–1.27)
Glucose: 110 mg/dL — ABNORMAL HIGH (ref 65–99)
Potassium: 3.9 mmol/L (ref 3.5–5.2)
Sodium: 133 mmol/L — ABNORMAL LOW (ref 134–144)
eGFR: 106 mL/min/{1.73_m2} (ref >59)

## 2020-09-06 ENCOUNTER — Other Ambulatory Visit: Payer: Self-pay

## 2020-09-06 MED FILL — Carvedilol Tab 6.25 MG: ORAL | 30 days supply | Qty: 60 | Fill #3 | Status: CN

## 2020-09-07 ENCOUNTER — Other Ambulatory Visit: Payer: Self-pay

## 2020-09-21 ENCOUNTER — Other Ambulatory Visit: Payer: Self-pay

## 2020-09-23 ENCOUNTER — Ambulatory Visit: Payer: BC Managed Care – PPO | Attending: Family Medicine | Admitting: Pharmacist

## 2020-09-23 ENCOUNTER — Encounter: Payer: Self-pay | Admitting: Pharmacist

## 2020-09-23 ENCOUNTER — Other Ambulatory Visit: Payer: Self-pay

## 2020-09-23 VITALS — BP 147/99 | HR 99

## 2020-09-23 DIAGNOSIS — I10 Essential (primary) hypertension: Secondary | ICD-10-CM

## 2020-09-23 MED ORDER — AMLODIPINE BESYLATE 5 MG PO TABS
5.0000 mg | ORAL_TABLET | Freq: Every day | ORAL | 2 refills | Status: DC
  Filled 2020-09-23: qty 30, 30d supply, fill #0

## 2020-09-23 NOTE — Progress Notes (Signed)
   S:    PCP: Dr. Alvis Lemmings  Patient arrives with pain in hips but otherwise in good spirits. Presents to the clinic for hypertension evaluation, counseling, and management. Patient was last seen in clinic on 08/26/2020. He had mistakenly stopped carvedilol prior to that appointment. We restarted this and had him continue HCTZ.   Medication adherence reported.   Current BP Medications include:  Carvedilol 6.25 mg BID, HCTZ 25 mg daily  Antihypertensives tried in the past include: lisinopril 10 mg (discontinued d/t dry cough)  Dietary habits include:  - reports not currently restricting sodium and consuming coffee and sodas sometimes Exercise habits include: - currently unable d/t to pain in hips Family / Social history:  FH:  - mother: MI SH:  - alcohol: previous hx of alcohol abuse (quit 1 yr ago) - smoking: cigarettes some days  O:  Vitals:   09/23/20 0954  BP: (!) 147/99  Pulse: 99   Home BP readings: None availble  Last 3 Office BP readings: BP Readings from Last 3 Encounters:  09/23/20 (!) 147/99  08/26/20 (!) 148/98  08/11/20 (!) 170/109   BMET    Component Value Date/Time   NA 133 (L) 08/26/2020 1012   K 3.9 08/26/2020 1012   CL 93 (L) 08/26/2020 1012   CO2 24 08/26/2020 1012   GLUCOSE 110 (H) 08/26/2020 1012   GLUCOSE 84 11/24/2018 1242   BUN 10 08/26/2020 1012   CREATININE 0.87 08/26/2020 1012   CALCIUM 10.0 08/26/2020 1012   GFRNONAA 106 03/08/2020 1548   GFRAA 122 03/08/2020 1548   Renal function: CrCl cannot be calculated (Patient's most recent lab result is older than the maximum 21 days allowed.).  Clinical ASCVD: No  The 10-year ASCVD risk score Denman [name] DC Jr., et al., 2013) is: 16.9%   Values used to calculate the score:     Age: 50 years     Sex: Male     Is Non-Hispanic African American: Yes     Diabetic: No     Tobacco smoker: Yes     Systolic Blood Pressure: 147 mmHg     Is BP treated: Yes     HDL Cholesterol: 41 mg/dL     Total  Cholesterol: 156 mg/dL  A/P: Hypertension diagnosed around 2013. BP currently above goal on current regimen. BP Goal = < 130/80 mmHg.   -Continue HCTZ and carvedilol.  -Start amlodipine 5 mg daily and educated pt this is in addition to current regimen, not a replacement for anything.  -Counseled on lifestyle modifications for blood pressure control including reduced dietary sodium, increased exercise, adequate sleep.  Results reviewed and written information provided. Total time in face-to-face counseling 30 minutes.   F/U Clinic Visit in 1 month with [facility].  Butch Penny, PharmD, P[place]y [place], CPP Clinical Pharmacist [facility] [phone]

## 2020-09-28 ENCOUNTER — Other Ambulatory Visit: Payer: Self-pay

## 2020-09-29 ENCOUNTER — Other Ambulatory Visit: Payer: Self-pay

## 2020-10-08 DIAGNOSIS — M25551 Pain in right hip: Secondary | ICD-10-CM | POA: Diagnosis not present

## 2020-10-11 ENCOUNTER — Other Ambulatory Visit: Payer: Self-pay

## 2020-10-11 MED FILL — Carvedilol Tab 6.25 MG: ORAL | 30 days supply | Qty: 60 | Fill #3 | Status: CN

## 2020-10-13 ENCOUNTER — Telehealth: Payer: Self-pay | Admitting: Orthopaedic Surgery

## 2020-10-13 DIAGNOSIS — Z5181 Encounter for therapeutic drug level monitoring: Secondary | ICD-10-CM | POA: Diagnosis not present

## 2020-10-13 DIAGNOSIS — Z79891 Long term (current) use of opiate analgesic: Secondary | ICD-10-CM | POA: Diagnosis not present

## 2020-10-13 NOTE — Telephone Encounter (Signed)
Received [employer] forms. Unable to complete as patient not seen since 4/22. Faxed back advising same.

## 2020-10-15 ENCOUNTER — Other Ambulatory Visit: Payer: Self-pay

## 2020-10-15 MED FILL — Carvedilol Tab 6.25 MG: ORAL | 30 days supply | Qty: 60 | Fill #3 | Status: CN

## 2020-10-20 ENCOUNTER — Other Ambulatory Visit: Payer: Self-pay | Admitting: Family Medicine

## 2020-10-20 DIAGNOSIS — I10 Essential (primary) hypertension: Secondary | ICD-10-CM

## 2020-10-20 MED ORDER — HYDROCHLOROTHIAZIDE 25 MG PO TABS
25.0000 mg | ORAL_TABLET | Freq: Every day | ORAL | 3 refills | Status: DC
Start: 2020-10-20 — End: 2021-01-19

## 2020-10-20 NOTE — Telephone Encounter (Signed)
Medication Refill - Medication: Hydrochlorothiazide, carvedilol  Has the patient contacted their pharmacy? Yes.   Pt wife calling on behalf of pt. She states that they did contact pharmacy and that pharmacy states they have not received a response back from office. She states that the pt is completely out of medication. Please advise.  (Agent: If no, request that the patient contact the pharmacy for the refill.) (Agent: If yes, when and what did the pharmacy advise?)  Preferred Pharmacy (with phone number or street name):  Has the patient been seen for an appointment in the last year [id number] - [place], [place] - [address] AT [facility] [address]  [address] [place] [address]  Phone: [phone] Fax: [phone]  Hours: Open 24 hours  R does the patient have an upcoming appointment? Yes.    Agent: Please be advised that RX refills may take up to 3 business days. We ask that you follow-up with your pharmacy.

## 2020-10-25 ENCOUNTER — Ambulatory Visit: Payer: BC Managed Care – PPO | Attending: Family Medicine | Admitting: Pharmacist

## 2020-10-25 ENCOUNTER — Other Ambulatory Visit: Payer: Self-pay

## 2020-10-25 DIAGNOSIS — I10 Essential (primary) hypertension: Secondary | ICD-10-CM | POA: Diagnosis not present

## 2020-10-25 MED ORDER — CARVEDILOL 6.25 MG PO TABS
ORAL_TABLET | Freq: Two times a day (BID) | ORAL | 1 refills | Status: DC
Start: 2020-10-25 — End: 2021-02-14

## 2020-10-25 MED ORDER — AMLODIPINE BESYLATE 5 MG PO TABS: 5.0000 mg | ORAL_TABLET | Freq: Every day | ORAL | 0 refills | Status: DC

## 2020-10-25 NOTE — Progress Notes (Signed)
   S:    PCP: Dr. Alvis Lemmings  Patient arrives in good spirits. Presents to the clinic for hypertension evaluation, counseling, and management. Patient was last seen in clinic on 09/23/2020. We started amlodipine 5 mg at that visit.   Medication adherence denied. He stopped taking amlodipine after ~2 weeks because he believed this was decreasing his appetite. Of note, he is also on Ozempic. Additionally, he ran out of carvedilol ~1 week ago.   Current BP Medications include:  amlodipine 5 mg daily (not taking), carvedilol 6.25 mg BID (pt ran out of this ~1 week ago), HCTZ 25 mg daily  Antihypertensives tried in the past include: lisinopril 10 mg (discontinued d/t dry cough)  Dietary habits include:  - reports not currently restricting sodium and consuming coffee and sodas sometimes Exercise habits include: - currently unable d/t to pain in hips Family / Social history:  FH:  - mother: MI SH:  - alcohol: previous hx of alcohol abuse (quit 1 yr ago) - smoking: cigarettes some days  O:  Vitals:   10/25/20 0922  BP: (!) 154/95  Pulse: (!) 116   Home BP readings: None availble  Last 3 Office BP readings: BP Readings from Last 3 Encounters:  10/25/20 (!) 154/95  09/23/20 (!) 147/99  08/26/20 (!) 148/98   BMET    Component Value Date/Time   NA 133 (L) 08/26/2020 1012   K 3.9 08/26/2020 1012   CL 93 (L) 08/26/2020 1012   CO2 24 08/26/2020 1012   GLUCOSE 110 (H) 08/26/2020 1012   GLUCOSE 84 11/24/2018 1242   BUN 10 08/26/2020 1012   CREATININE 0.87 08/26/2020 1012   CALCIUM 10.0 08/26/2020 1012   GFRNONAA 106 03/08/2020 1548   GFRAA 122 03/08/2020 1548   Renal function: CrCl cannot be calculated (Patient's most recent lab result is older than the maximum 21 days allowed.).  Clinical ASCVD: No  The 10-year ASCVD risk score (Arnett DK, et al., 2019) is: 18.3%   Values used to calculate the score:     Age: 50 years     Sex: Male     Is Non-Hispanic African American: Yes      Diabetic: No     Tobacco smoker: Yes     Systolic Blood Pressure: 154 mmHg     Is BP treated: Yes     HDL Cholesterol: 41 mg/dL     Total Cholesterol: 156 mg/dL  A/P: Hypertension longstanding. Currently uncontrolled secondary to medication non-adherence. BP Goal = < 130/80 mmHg. Patient is on Ozempic which is likely contributing to his loss in appetite. Discussed that this is an expected side effect. I provided education on this. He is amenable to restarting the amlodipine at night after a meal.  -Continue HCTZ. -Reorder, restart carvedilol.  -Resume amlodipine 5 mg daily and educated pt this is in addition to current regimen, not a replacement for anything. He is to take before bedtime after a meal.   -Counseled on lifestyle modifications for blood pressure control including reduced dietary sodium, increased exercise, adequate sleep.  Results reviewed and written information provided. Total time in face-to-face counseling 30 minutes.   F/U Clinic Visit in 2-3 weeks with PCP.   Butch Penny, PharmD, P[place]y [place], CPP Clinical Pharmacist [facility] [phone]

## 2020-11-09 ENCOUNTER — Ambulatory Visit: Payer: BC Managed Care – PPO | Attending: Family Medicine | Admitting: Family Medicine

## 2020-11-09 ENCOUNTER — Encounter: Payer: Self-pay | Admitting: Family Medicine

## 2020-11-09 ENCOUNTER — Other Ambulatory Visit: Payer: Self-pay

## 2020-11-09 VITALS — BP 150/83 | HR 105 | Resp 16 | Wt 289.4 lb

## 2020-11-09 DIAGNOSIS — M160 Bilateral primary osteoarthritis of hip: Secondary | ICD-10-CM | POA: Diagnosis not present

## 2020-11-09 DIAGNOSIS — R42 Dizziness and giddiness: Secondary | ICD-10-CM

## 2020-11-09 DIAGNOSIS — Z23 Encounter for immunization: Secondary | ICD-10-CM

## 2020-11-09 DIAGNOSIS — M1A00X0 Idiopathic chronic gout, unspecified site, without tophus (tophi): Secondary | ICD-10-CM

## 2020-11-09 DIAGNOSIS — Z720 Tobacco use: Secondary | ICD-10-CM

## 2020-11-09 DIAGNOSIS — F17210 Nicotine dependence, cigarettes, uncomplicated: Secondary | ICD-10-CM

## 2020-11-09 DIAGNOSIS — I10 Essential (primary) hypertension: Secondary | ICD-10-CM | POA: Diagnosis not present

## 2020-11-09 MED ORDER — ALLOPURINOL 300 MG PO TABS
ORAL_TABLET | ORAL | 1 refills | Status: DC
  Filled 2020-11-09: qty 60, 30d supply, fill #0

## 2020-11-09 NOTE — Progress Notes (Signed)
Subjective:  Patient ID: Daniel [name], male    DOB: 05-Oct-1970  Age: 50 y.o. MRN: [id number]  CC: Hypertension   HPI Daniel [name] is a 50 y.o. year old male with a history of Previous alcohol abuse (quit 1 year ago) hypertension, pulmonary embolism (in 02/2018; completed course of anticoagulation) prediabetes (A1c 6.4) here for follow-up visit.  Interval History: At his last visit he was started on Ozempic for prediabetes and he has lost 17 pounds in the last 3 months.  He has had some nausea with Ozempic but is overall doing well on it.  He has been feeling dizzy when he bent his head and got up too fast on 2 occassions. Denies room spinning or dizziness on turning his head . He has no headache but has occasional blurry vision and thinks he needs to see his optometrist as he has not had a recent eye exam and he wears glasses. His BP is elevated as he did not take Amlodipine because he takes sit at night  His short term disability was stopped and he is thinking of filing for disability. He is unable to work due to pain in his hips and knees and is awaiting surgery in his hips. He needs to loose 50 lbs for surgery to happen. His gout is stable with no recent flares. Past Medical History:  Diagnosis Date   Asthma    Asthma    Hypertension     Past Surgical History:  Procedure Laterality Date   Dental procedure      Family History  Problem Relation Age of Onset   Heart attack Mother     No Known Allergies  Outpatient Medications Prior to Visit  Medication Sig Dispense Refill   albuterol (VENTOLIN HFA) 108 (90 Base) MCG/ACT inhaler Inhale 1 puff into the lungs every 6 (six) hours as needed for wheezing or shortness of breath. 8.5 g 0   amLODipine (NORVASC) 5 MG tablet Take 1 tablet (5 mg total) by mouth daily. 90 tablet 0   carvedilol (COREG) 6.25 MG tablet TAKE 1 TABLET (6.25 MG TOTAL) BY MOUTH 2 (TWO) TIMES DAILY WITH A MEAL. 180 tablet 1   celecoxib (CELEBREX) 200 MG  capsule Celebrex 200 mg capsule  Take 1 capsule every day by oral route.     hydrochlorothiazide (HYDRODIURIL) 25 MG tablet Take 1 tablet (25 mg total) by mouth daily. 30 tablet 3   Oxycodone HCl 10 MG TABS oxycodone 10 mg tablet  Take 1 tablet 3 times a day by oral route as needed.     Semaglutide,0.25 or 0.5MG /DOS, (OZEMPIC, 0.25 OR 0.5 MG/DOSE,) 2 MG/1.5ML SOPN Inject 0.5 mg into the skin once a week. 2 mL 6   allopurinol (ZYLOPRIM) 300 MG tablet TAKE 1 TABLET (300 MG TOTAL) BY MOUTH 2 (TWO) TIMES DAILY. 60 tablet 3   HYDROcodone-acetaminophen (NORCO) 5-325 MG tablet Take 1-2 tablets by mouth daily as needed. (Patient not taking: No sig reported) 10 tablet 0   hydrocortisone 1 % ointment Apply 1 application topically 2 (two) times daily. (Patient not taking: No sig reported) 30 g 2   meloxicam (MOBIC) 7.5 MG tablet TAKE 1 TABLET (7.5 MG TOTAL) BY MOUTH DAILY. (Patient not taking: No sig reported) 90 tablet 1   Multiple Vitamin (MULTIVITAMIN WITH MINERALS) TABS tablet Take 1 tablet by mouth daily. (Patient not taking: No sig reported) 30 tablet 0   naproxen (NAPROSYN) 500 MG tablet TAKE 1 TABLET (500 MG TOTAL) BY MOUTH  2 (TWO) TIMES DAILY. (Patient not taking: No sig reported) 30 tablet 0   potassium chloride SA (KLOR-CON) 20 MEQ tablet Take 1 tablet (20 mEq total) by mouth daily. (Patient not taking: No sig reported) 30 tablet 6   thiamine 100 MG tablet Take 1 tablet (100 mg total) by mouth daily. (Patient not taking: Reported on 11/09/2020) 30 tablet 3   No facility-administered medications prior to visit.     ROS Review of Systems  Constitutional:  Negative for activity change and appetite change.  HENT:  Negative for sinus pressure and sore throat.   Eyes:  Negative for visual disturbance.  Respiratory:  Negative for cough, chest tightness and shortness of breath.   Cardiovascular:  Negative for chest pain and leg swelling.  Gastrointestinal:  Negative for abdominal distention,  abdominal pain, constipation and diarrhea.  Endocrine: Negative.   Genitourinary:  Negative for dysuria.  Musculoskeletal:        See HPI  Skin:  Negative for rash.  Allergic/Immunologic: Negative.   Neurological:  Positive for light-headedness. Negative for weakness and numbness.  Psychiatric/Behavioral:  Negative for dysphoric mood and suicidal ideas.    Objective:  BP (!) 150/83   Pulse (!) 105   Resp 16   Wt 289 lb 6.4 oz (131.3 kg)   SpO2 95%   BMI 44.00 kg/m   BP/Weight 11/09/2020 10/25/2020 09/23/2020  Systolic BP 150 154 147  Diastolic BP 83 95 99  Wt. (Lbs) 289.4 - -  BMI 44 - -  Some encounter information is confidential and restricted. Go to Review Flowsheets activity to see all data.      Physical Exam Constitutional:      Appearance: He is well-developed. He is obese.  Cardiovascular:     Rate and Rhythm: Normal rate.     Heart sounds: Normal heart sounds. No murmur heard. Pulmonary:     Effort: Pulmonary effort is normal.     Breath sounds: Normal breath sounds. No wheezing or rales.  Chest:     Chest wall: No tenderness.  Abdominal:     General: Bowel sounds are normal. There is no distension.     Palpations: Abdomen is soft. There is no mass.     Tenderness: There is no abdominal tenderness.  Musculoskeletal:     Right lower leg: No edema.     Left lower leg: No edema.     Comments: Restricted range of motion in bilateral hips Tenderness associated with range of motion in hips  Neurological:     Mental Status: He is alert and oriented to person, place, and time.  Psychiatric:        Mood and Affect: Mood normal.    CMP Latest Ref Rng & Units 08/26/2020 03/08/2020 05/28/2019  Glucose 65 - 99 mg/dL 630(Z) 95 74  BUN 6 - 24 mg/dL 10 12 9   Creatinine 0.76 - 1.27 mg/dL 6.01 0.93  Sodium 134 - 144 mmol/L 133(L) 141 143  Potassium 3.5 - 5.2 mmol/L 3.9 3.8 4.4  Chloride 96 - 106 mmol/L 93(L) 101 99  CO2 20 - 29 mmol/L 24 25 19(L)  Calcium 8.7 - 10.2  mg/dL 2.35 9.5 9.5  Total Protein 6.0 - 8.5 g/dL - 7.3 8.2  Total Bilirubin 0.0 - 1.2 mg/dL - 0.4 0.5  Alkaline Phos 44 - 121 IU/L - 80 102  AST 0 - 40 IU/L - 20 118(H)  ALT 0 - 44 IU/L - 11 89(H)    Lipid Panel  Component Value Date/Time   CHOL 156 08/11/2020 1056   TRIG 88 08/11/2020 1056   HDL 41 08/11/2020 1056   CHOLHDL 3.8 08/11/2020 1056   LDLCALC 98 08/11/2020 1056    CBC    Component Value Date/Time   WBC 7.9 05/28/2019 1622   WBC 10.0 11/24/2018 1242   RBC 4.49 05/28/2019 1622   RBC 4.43 11/24/2018 1242   HGB 15.0 05/28/2019 1622   HCT 43.2 05/28/2019 1622   PLT 306 05/28/2019 1622   MCV 96 05/28/2019 1622   MCH 33.4 (H) 05/28/2019 1622   MCH 32.1 11/24/2018 1242   MCHC 34.7 05/28/2019 1622   MCHC 34.2 11/24/2018 1242   RDW 15.5 (H) 05/28/2019 1622   LYMPHSABS 1.8 05/28/2019 1622   MONOABS 0.9 11/24/2018 1242   EOSABS 0.1 05/28/2019 1622   BASOSABS 0.1 05/28/2019 1622    Lab Results  Component Value Date   HGBA1C 6.4 (A) 08/11/2020    Assessment & Plan:  1. Idiopathic chronic gout without tophus, unspecified site Stable with no acute flares - allopurinol (ZYLOPRIM) 300 MG tablet; TAKE 1 TABLET (300 MG TOTAL) BY MOUTH 2 (TWO) TIMES DAILY.  Dispense: 180 tablet; Refill: 1  2. Need for immunization against influenza - Flu Vaccine QUAD 37mo+IM (Fluarix, Fluzone & Alfiuria Quad PF)  3. Tobacco abuse Spent 3 minutes counseling on smoking cessation and he declines pharmacotherapy, is working on quitting on his own  4. Essential hypertension Uncontrolled Yet to take amlodipine today He usually holds off until evening due to nausea associated with amlodipine and Ozempic per patient Counseled on blood pressure goal of less than 130/80, low-sodium, DASH diet, medication compliance, 150 minutes of moderate intensity exercise per week. Discussed medication compliance, adverse effects.  5. Bilateral primary osteoarthritis of hip Uncontrolled Currently  working on weight loss so he can proceed with total hip arthroplasty  6. Dizziness Symptoms are negative for vertigo He is not hypotensive Advised to change positions slowly   Health Care Maintenance: Referral for colonoscopy at next visit. Meds ordered this encounter  Medications   allopurinol (ZYLOPRIM) 300 MG tablet    Sig: TAKE 1 TABLET (300 MG TOTAL) BY MOUTH 2 (TWO) TIMES DAILY.    Dispense:  180 tablet    Refill:  1    Follow-up: Return in about 3 months (around 02/09/2021) for Medical conditions.       Hoy Register, MD, FAAFP. [facility] [place], [place] [phone]   11/09/2020, 12:07 PM

## 2020-11-10 ENCOUNTER — Other Ambulatory Visit: Payer: Self-pay

## 2020-11-11 ENCOUNTER — Ambulatory Visit: Payer: BC Managed Care – PPO | Admitting: Family Medicine

## 2021-01-06 DIAGNOSIS — M5416 Radiculopathy, lumbar region: Secondary | ICD-10-CM | POA: Diagnosis not present

## 2021-01-06 DIAGNOSIS — M25551 Pain in right hip: Secondary | ICD-10-CM | POA: Diagnosis not present

## 2021-01-06 DIAGNOSIS — M25552 Pain in left hip: Secondary | ICD-10-CM | POA: Diagnosis not present

## 2021-01-19 ENCOUNTER — Other Ambulatory Visit: Payer: Self-pay | Admitting: Family Medicine

## 2021-01-19 DIAGNOSIS — I10 Essential (primary) hypertension: Secondary | ICD-10-CM

## 2021-01-19 NOTE — Telephone Encounter (Signed)
Requested Prescriptions  Pending Prescriptions Disp Refills   hydrochlorothiazide (HYDRODIURIL) 25 MG tablet [Pharmacy Med Name: HYDROCHLOROTHIAZIDE 25 MG TAB] 90 tablet 1    Sig: TAKE 1 TABLET (25 MG TOTAL) BY MOUTH DAILY.     Cardiovascular: Diuretics - Thiazide Failed - 01/19/2021 12:07 AM      Failed - Na in normal range and within 360 days    Sodium  Date Value Ref Range Status  08/26/2020 133 (L) 134 - 144 mmol/L Final         Failed - Last BP in normal range    BP Readings from Last 1 Encounters:  11/09/20 (!) 150/83         Passed - Ca in normal range and within 360 days    Calcium  Date Value Ref Range Status  08/26/2020 10.0 8.7 - 10.2 mg/dL Final   Calcium, Ion  Date Value Ref Range Status  10/03/2014 1.15 1.12 - 1.23 mmol/L Final         Passed - Cr in normal range and within 360 days    Creatinine, Ser  Date Value Ref Range Status  08/26/2020 0.87 0.76 - 1.27 mg/dL Final         Passed - K in normal range and within 360 days    Potassium  Date Value Ref Range Status  08/26/2020 3.9 3.5 - 5.2 mmol/L Final         Passed - Valid encounter within last 6 months    Recent Outpatient Visits          2 months ago Essential hypertension   [facility] [place], Odette Horns, MD   2 months ago Essential hypertension   [facility] [place], Cornelius Moras, RPH-CPP   3 months ago Essential hypertension   [facility] [place], Cornelius Moras, RPH-CPP   4 months ago Essential hypertension   [facility] [place], Cornelius Moras, RPH-CPP   5 months ago Essential hypertension   [facility] Hoy Register, MD      Future Appointments            In 3 weeks Hoy Register, MD [facility]

## 2021-01-20 ENCOUNTER — Other Ambulatory Visit: Payer: Self-pay | Admitting: Family Medicine

## 2021-01-20 NOTE — Telephone Encounter (Signed)
Requested Prescriptions  Pending Prescriptions Disp Refills   amLODipine (NORVASC) 5 MG tablet [Pharmacy Med Name: AMLODIPINE BESYLATE 5 MG TAB] 90 tablet 0    Sig: TAKE 1 TABLET (5 MG TOTAL) BY MOUTH DAILY.     Cardiovascular:  Calcium Channel Blockers Failed - 01/20/2021 12:07 AM      Failed - Last BP in normal range    BP Readings from Last 1 Encounters:  11/09/20 (!) 150/83         Passed - Valid encounter within last 6 months    Recent Outpatient Visits          2 months ago Essential hypertension   [facility] [place], Odette Horns, MD   2 months ago Essential hypertension   [facility] [place], Cornelius Moras, RPH-CPP   3 months ago Essential hypertension   [facility] Dru[place]illa Chalet, RPH-CPP   4 months ago Essential hypertension   [facility] [place]is Hux[place], Cornelius Moras, RPH-CPP   5 months ago Essential hypertension    [facility] Hoy Register, MD      Future Appointments            In 3 weeks Hoy Register, MD [facility]

## 2021-02-14 ENCOUNTER — Other Ambulatory Visit: Payer: Self-pay

## 2021-02-14 ENCOUNTER — Ambulatory Visit: Payer: BC Managed Care – PPO | Attending: Family Medicine | Admitting: Family Medicine

## 2021-02-14 ENCOUNTER — Encounter: Payer: Self-pay | Admitting: Family Medicine

## 2021-02-14 VITALS — BP 138/92 | HR 103 | Ht 68.0 in | Wt 290.0 lb

## 2021-02-14 DIAGNOSIS — L0100 Impetigo, unspecified: Secondary | ICD-10-CM

## 2021-02-14 DIAGNOSIS — I10 Essential (primary) hypertension: Secondary | ICD-10-CM

## 2021-02-14 DIAGNOSIS — R7303 Prediabetes: Secondary | ICD-10-CM | POA: Diagnosis not present

## 2021-02-14 DIAGNOSIS — Z1211 Encounter for screening for malignant neoplasm of colon: Secondary | ICD-10-CM

## 2021-02-14 DIAGNOSIS — Z1159 Encounter for screening for other viral diseases: Secondary | ICD-10-CM | POA: Diagnosis not present

## 2021-02-14 DIAGNOSIS — M1A00X0 Idiopathic chronic gout, unspecified site, without tophus (tophi): Secondary | ICD-10-CM

## 2021-02-14 LAB — POCT GLYCOSYLATED HEMOGLOBIN (HGB A1C): HbA1c, POC (controlled diabetic range): 6.4 % (ref 0.0–7.0)

## 2021-02-14 LAB — GLUCOSE, POCT (MANUAL RESULT ENTRY): POC Glucose: 126 mg/dl — AB (ref 70–99)

## 2021-02-14 MED ORDER — HYDROCHLOROTHIAZIDE 25 MG PO TABS
25.0000 mg | ORAL_TABLET | Freq: Every day | ORAL | 1 refills | Status: DC
  Filled 2021-02-14 (×2): qty 30, 30d supply, fill #0

## 2021-02-14 MED ORDER — CARVEDILOL 6.25 MG PO TABS
ORAL_TABLET | Freq: Two times a day (BID) | ORAL | 1 refills | Status: DC
  Filled 2021-02-14: qty 180, fill #0
  Filled 2021-02-14: qty 60, 30d supply, fill #0

## 2021-02-14 MED ORDER — AMLODIPINE BESYLATE 5 MG PO TABS
5.0000 mg | ORAL_TABLET | Freq: Every day | ORAL | 1 refills | Status: DC
Start: 2021-02-14 — End: 2021-04-22
  Filled 2021-02-14: qty 90, 90d supply, fill #0
  Filled 2021-02-14: qty 30, 30d supply, fill #0

## 2021-02-14 MED ORDER — ALLOPURINOL 300 MG PO TABS
ORAL_TABLET | ORAL | 1 refills | Status: DC
  Filled 2021-02-14 (×2): qty 60, 30d supply, fill #0

## 2021-02-14 MED ORDER — OZEMPIC (0.25 OR 0.5 MG/DOSE) 2 MG/1.5ML ~~LOC~~ SOPN
0.5000 mg | PEN_INJECTOR | SUBCUTANEOUS | 6 refills | Status: DC
  Filled 2021-02-14 – 2021-02-23 (×3): qty 1.5, 28d supply, fill #0

## 2021-02-14 MED ORDER — CEPHALEXIN 500 MG PO CAPS
500.0000 mg | ORAL_CAPSULE | Freq: Two times a day (BID) | ORAL | 0 refills | Status: DC
  Filled 2021-02-14 (×2): qty 14, 7d supply, fill #0

## 2021-02-14 NOTE — Patient Instructions (Signed)
Impetigo, Adult Impetigo is an infection of the skin. It commonly occurs in young children, but it can also occur in adults. The infection causes itchy blisters and sores that produce brownish-yellow fluid. As the fluid dries, it forms a thick, honey-colored crust. These skin changes usually occur on the face, but they can also affect other areas of the body. Impetigo usually goes away in 7-10 days with treatment. What are the causes? This condition is caused by two types of bacteria. It may be caused by staphylococci or streptococci bacteria. These bacteria cause impetigo when they get under the surface of the skin. This often happens after some damage to the skin, such as: Cuts, scrapes, or scratches. Rashes. Insect bites, especially when you scratch the area of a bite. Chickenpox or other illnesses that cause open skin sores. Nail biting or chewing. Impetigo can spread easily from one person to another (is contagious). It may be spread through close skin contact or by sharing towels, clothing, or other items that an infected person has touched. Scratching the affected area can cause impetigo to spread to other parts of the body. The bacteria can get under your fingernails and spread when you touch another area of your skin. What increases the risk? The following factors may make you more likely to develop this condition: Playing sports that include skin-to-skin contact with others. Having broken skin, such as from a cut or scrape. Living in an area that has high humidity levels. Having poor hygiene. Having high levels of staphylococci in your nose. Having a condition that weakens the skin integrity, such as: Having a weak body defense system (immune system). Having a skin condition with open sores, such as chickenpox. Having diabetes. What are the signs or symptoms? The main symptom of this condition is small blisters, often on the face around the mouth and nose. In time, the blisters break  open and turn into tiny sores (lesions) with a yellow crust. In some cases, the blisters cause itching or burning. Scratching, irritation, or lack of treatment may cause these small lesions to get larger. Other possible symptoms include: Larger blisters. Pus. Swollen lymph glands. How is this diagnosed? This condition is usually diagnosed during a physical exam. A skin sample or a sample of fluid from a blister may be taken for lab tests that involve growing bacteria (culture test). Lab tests can help confirm the diagnosis or help determine the best treatment. How is this treated? Treatment for this condition depends on the severity of the condition: Mild impetigo can be treated with prescription antibiotic cream. Oral antibiotic medicine may be used in more severe cases. Medicines that reduce itchiness (antihistamines)may also be used. Follow these instructions at home: Medicines Take over-the-counter and prescription medicines only as told by your health care provider. Apply or take your antibiotic as told by your health care provider. Do not stop using the antibiotic even if your condition improves. Before applying antibiotic cream or ointment, you should: Gently wash the infected areas with antibacterial soap and warm water. Soak crusted areas in warm, soapy water using antibacterial soap. Gently rub the areas to remove crusts. Do not scrub. Preventing the spread of infection  To help prevent impetigo from spreading to other body areas: Keep your fingernails short and clean. Do not scratch the blisters or sores. Cover infected areas, if necessary, to keep from scratching. Wash your hands often with soap and warm water. To help prevent impetigo from spreading to other people: Do not share towels.   Wash your clothing and bedsheets in water that is 140F (60C) or warmer. Stay home until you have used an antibiotic cream for 48 hours (2 days) or an oral antibiotic medicine for 24 hours  (1 day). You should only return to work and activities with other people if your skin shows significant improvement. You may return to contact sports after you have used antibiotic medicine for 72 hours (3 days). General instructions Keep all follow-up visits. This is important. How is this prevented? Wash your hands often with soap and warm water. Do not share towels, washcloths, clothing, bedding, or razors. Keep your fingernails short. Keep any cuts, scrapes, bug bites, or rashes clean and covered. Use insect repellent to prevent bug bites. Contact a health care provider if: You develop more blisters or sores, even with treatment. Other family members get sores. Your skin sores are not improving after 72 hours (3 days) of treatment. You have a fever. Get help right away if: You see spreading redness or swelling of the skin around your sores. You develop a sore throat. The area around your rash becomes warm, red, or tender to the touch. You have dark, reddish-brown urine. You do not urinate often or you urinate small amounts. You are very tired (lethargic). You have swelling in your face, hands, or feet. Summary Impetigo is a skin infection that causes itchy blisters and sores that produce brownish-yellow fluid. As the fluid dries, it forms a crust. This condition is caused by staphylococci or streptococci bacteria. These bacteria cause impetigo when they get under the surface of the skin, such as through cuts, rashes, bug bites, or open sores. Treatment for this condition may include antibiotic ointment or oral antibiotics. To help prevent impetigo from spreading to other body areas, make sure you keep your fingernails short, avoid scratching, cover any blisters, and wash your hands often. If you have impetigo, stay home until you have used an antibiotic cream for 48 hours (2 days) or an oral antibiotic medicine for 24 hours (1 day). You should only return to work and activities with  other people if your skin shows significant improvement. This information is not intended to replace advice given to you by your health care provider. Make sure you discuss any questions you have with your health care provider. Document Revised: 06/11/2019 Document Reviewed: 06/11/2019 Elsevier Patient Education  2022 [employer]  

## 2021-02-14 NOTE — Progress Notes (Signed)
Rash in back and chest.

## 2021-02-14 NOTE — Progress Notes (Signed)
Subjective:  Patient ID: Daniel [name], male    DOB: [birth year]  Age: 51 y.o. MRN: [id number]  CC: Diabetes   HPI Daniel [name] is a 51 y.o. year old male with a history of Previous alcohol abuse, hypertension, pulmonary embolism (in 02/2018; completed course of anticoagulation) prediabetes (A1c 6.4), bilateral hip osteoarthritis here for follow-up visit.  Interval History: He gained 11 pounds in the last 3 months but has been consistently taking his Ozempic.  He needs to lose weight to be able to undergo hip surgery. He has not been checking his sugars at home. Endorses compliance with his antihypertensive. He complains of a lesion in his lower back noticed 3 weeks ago which is itchy with associated with serous drainage.  Denies presence of fever or similar lesions in other body parts. Past Medical History:  Diagnosis Date   Asthma    Asthma    Hypertension     Past Surgical History:  Procedure Laterality Date   Dental procedure      Family History  Problem Relation Age of Onset   Heart attack Mother     No Known Allergies  Outpatient Medications Prior to Visit  Medication Sig Dispense Refill   albuterol (VENTOLIN HFA) 108 (90 Base) MCG/ACT inhaler Inhale 1 puff into the lungs every 6 (six) hours as needed for wheezing or shortness of breath. 8.5 g 0   celecoxib (CELEBREX) 200 MG capsule Celebrex 200 mg capsule  Take 1 capsule every day by oral route.     Oxycodone HCl 10 MG TABS oxycodone 10 mg tablet  Take 1 tablet 3 times a day by oral route as needed.     allopurinol (ZYLOPRIM) 300 MG tablet TAKE 1 TABLET (300 MG TOTAL) BY MOUTH 2 (TWO) TIMES DAILY. 180 tablet 1   amLODipine (NORVASC) 5 MG tablet TAKE 1 TABLET (5 MG TOTAL) BY MOUTH DAILY. 90 tablet 0   carvedilol (COREG) 6.25 MG tablet TAKE 1 TABLET (6.25 MG TOTAL) BY MOUTH 2 (TWO) TIMES DAILY WITH A MEAL. 180 tablet 1   hydrochlorothiazide (HYDRODIURIL) 25 MG tablet TAKE 1 TABLET (25 MG TOTAL) BY MOUTH DAILY. 90  tablet 1   HYDROcodone-acetaminophen (NORCO) 5-325 MG tablet Take 1-2 tablets by mouth daily as needed. (Patient not taking: Reported on 08/11/2020) 10 tablet 0   hydrocortisone 1 % ointment Apply 1 application topically 2 (two) times daily. (Patient not taking: Reported on 08/11/2020) 30 g 2   Multiple Vitamin (MULTIVITAMIN WITH MINERALS) TABS tablet Take 1 tablet by mouth daily. (Patient not taking: Reported on 05/16/2018) 30 tablet 0   naproxen (NAPROSYN) 500 MG tablet TAKE 1 TABLET (500 MG TOTAL) BY MOUTH 2 (TWO) TIMES DAILY. (Patient not taking: Reported on 08/11/2020) 30 tablet 0   potassium chloride SA (KLOR-CON) 20 MEQ tablet Take 1 tablet (20 mEq total) by mouth daily. (Patient not taking: Reported on 05/28/2019) 30 tablet 6   Semaglutide,0.25 or 0.5MG /DOS, (OZEMPIC, 0.25 OR 0.5 MG/DOSE,) 2 MG/1.5ML SOPN Inject 0.5 mg into the skin once a week. 2 mL 6   thiamine 100 MG tablet Take 1 tablet (100 mg total) by mouth daily. (Patient not taking: Reported on 11/09/2020) 30 tablet 3   No facility-administered medications prior to visit.     ROS Review of Systems  Constitutional:  Negative for activity change and appetite change.  HENT:  Negative for sinus pressure and sore throat.   Eyes:  Negative for visual disturbance.  Respiratory:  Negative for cough, chest  tightness and shortness of breath.   Cardiovascular:  Negative for chest pain and leg swelling.  Gastrointestinal:  Negative for abdominal distention, abdominal pain, constipation and diarrhea.  Endocrine: Negative.   Genitourinary:  Negative for dysuria.  Musculoskeletal:        Bilateral hip pain  Skin:  Positive for rash.  Allergic/Immunologic: Negative.   Neurological:  Negative for weakness, light-headedness and numbness.  Psychiatric/Behavioral:  Negative for dysphoric mood and suicidal ideas.    Objective:  BP (!) 138/92    Pulse (!) 103    Ht 5\' 8"  (1.727 m)    Wt 290 lb (131.5 kg)    SpO2 97%    BMI 44.09 kg/m    BP/Weight 02/14/2021 11/09/2020 0000000  Systolic BP 0000000 [id number]  Diastolic BP 92 83 95  Wt. (Lbs) 290 289.4 -  BMI 44.09 44 -  Some encounter information is confidential and restricted. Go to Review Flowsheets activity to see all data.      Physical Exam Constitutional:      Appearance: He is well-developed.  Cardiovascular:     Rate and Rhythm: Tachycardia present.     Heart sounds: Normal heart sounds. No murmur heard. Pulmonary:     Effort: Pulmonary effort is normal.     Breath sounds: Normal breath sounds. No wheezing or rales.  Chest:     Chest wall: No tenderness.  Abdominal:     General: Bowel sounds are normal. There is no distension.     Palpations: Abdomen is soft. There is no mass.     Tenderness: There is no abdominal tenderness.  Musculoskeletal:        General: Normal range of motion.     Right lower leg: No edema.     Left lower leg: No edema.  Skin:    Comments: Dry scabbed over lesion slightly above natal cleft and on superomedial aspect of left gluteus muscle.  No drainage noted, not tender.  Neurological:     Mental Status: He is alert and oriented to person, place, and time.  Psychiatric:        Mood and Affect: Mood normal.    CMP Latest Ref Rng & Units 08/26/2020 03/08/2020 05/28/2019  Glucose 65 - 99 mg/dL 110(H) 95 74  BUN 6 - 24 mg/dL 10 12 9   Creatinine 0.76 - 1.27 mg/dL 0.87 0.81 0.85  Sodium 134 - 144 mmol/L 133(L) 141 143  Potassium 3.5 - 5.2 mmol/L 3.9 3.8 4.4  Chloride 96 - 106 mmol/L 93(L) 101 99  CO2 20 - 29 mmol/L 24 25 19(L)  Calcium 8.7 - 10.2 mg/dL 10.0 9.5 9.5  Total Protein 6.0 - 8.5 g/dL - 7.3 8.2  Total Bilirubin 0.0 - 1.2 mg/dL - 0.4 0.5  Alkaline Phos 44 - 121 IU/L - 80 102  AST 0 - 40 IU/L - 20 118(H)  ALT 0 - 44 IU/L - 11 89(H)    Lipid Panel     Component Value Date/Time   CHOL 156 08/11/2020 1056   TRIG 88 08/11/2020 1056   HDL 41 08/11/2020 1056   CHOLHDL 3.8 08/11/2020 1056   LDLCALC 98 08/11/2020 1056     CBC    Component Value Date/Time   WBC 7.9 05/28/2019 1622   WBC 10.0 11/24/2018 1242   RBC 4.49 05/28/2019 1622   RBC 4.43 11/24/2018 1242   HGB 15.0 05/28/2019 1622   HCT 43.2 05/28/2019 1622   PLT 306 05/28/2019 1622   MCV 96  05/28/2019 1622   MCH 33.4 (H) 05/28/2019 1622   MCH 32.1 11/24/2018 1242   MCHC 34.7 05/28/2019 1622   MCHC 34.2 11/24/2018 1242   RDW 15.5 (H) 05/28/2019 1622   LYMPHSABS 1.8 05/28/2019 1622   MONOABS 0.9 11/24/2018 1242   EOSABS 0.1 05/28/2019 1622   BASOSABS 0.1 05/28/2019 1622    Lab Results  Component Value Date   HGBA1C 6.4 02/14/2021    Assessment & Plan:  1. Prediabetes Controlled with A1c of 6.4 Unable to increase dose of Ozempic due to the fact that he does not check his sugars at home - POCT glycosylated hemoglobin (Hb A1C) - POCT glucose (manual entry) - Semaglutide,0.25 or 0.5MG /DOS, (OZEMPIC, 0.25 OR 0.5 MG/DOSE,) 2 MG/1.5ML SOPN; Inject 0.5 mg into the skin once a week.  Dispense: 2 mL; Refill: 6  2. Screening for colon cancer - Ambulatory referral to Gastroenterology  3. Screening for viral disease - HCV Ab w Reflex to Quant PCR  4. Impetigo - cephALEXin (KEFLEX) 500 MG capsule; Take 1 capsule (500 mg total) by mouth 2 (two) times daily.  Dispense: 14 capsule; Refill: 0  5. Idiopathic chronic gout without tophus, unspecified site Stable - allopurinol (ZYLOPRIM) 300 MG tablet; TAKE 1 TABLET (300 MG TOTAL) BY MOUTH 2 (TWO) TIMES DAILY.  Dispense: 180 tablet; Refill: 1  6. Essential hypertension Diastolic elevation No regimen changes today Counseled on blood pressure goal of less than 130/80, low-sodium, DASH diet, medication compliance, 150 minutes of moderate intensity exercise per week. Discussed medication compliance, adverse effects. - Basic Metabolic Panel - amLODipine (NORVASC) 5 MG tablet; Take 1 tablet (5 mg total) by mouth daily.  Dispense: 90 tablet; Refill: 1 - carvedilol (COREG) 6.25 MG tablet; TAKE 1  TABLET (6.25 MG TOTAL) BY MOUTH 2 (TWO) TIMES DAILY WITH A MEAL.  Dispense: 180 tablet; Refill: 1 - hydrochlorothiazide (HYDRODIURIL) 25 MG tablet; Take 1 tablet (25 mg total) by mouth daily.  Dispense: 90 tablet; Refill: 1    Meds ordered this encounter  Medications   allopurinol (ZYLOPRIM) 300 MG tablet    Sig: TAKE 1 TABLET (300 MG TOTAL) BY MOUTH 2 (TWO) TIMES DAILY.    Dispense:  180 tablet    Refill:  1   amLODipine (NORVASC) 5 MG tablet    Sig: Take 1 tablet (5 mg total) by mouth daily.    Dispense:  90 tablet    Refill:  1   carvedilol (COREG) 6.25 MG tablet    Sig: TAKE 1 TABLET (6.25 MG TOTAL) BY MOUTH 2 (TWO) TIMES DAILY WITH A MEAL.    Dispense:  180 tablet    Refill:  1   hydrochlorothiazide (HYDRODIURIL) 25 MG tablet    Sig: Take 1 tablet (25 mg total) by mouth daily.    Dispense:  90 tablet    Refill:  1   Semaglutide,0.25 or 0.5MG /DOS, (OZEMPIC, 0.25 OR 0.5 MG/DOSE,) 2 MG/1.5ML SOPN    Sig: Inject 0.5 mg into the skin once a week.    Dispense:  2 mL    Refill:  6   cephALEXin (KEFLEX) 500 MG capsule    Sig: Take 1 capsule (500 mg total) by mouth 2 (two) times daily.    Dispense:  14 capsule    Refill:  0    Follow-up: Return in about 6 months (around 08/14/2021) for Chronic medical conditions.       Charlott Rakes, MD, FAAFP. [facility] [place], [place]  02/14/2021, 11:51 AM

## 2021-02-15 LAB — HCV INTERPRETATION

## 2021-02-15 LAB — BASIC METABOLIC PANEL
BUN/Creatinine Ratio: 12 (ref 9–20)
BUN: 11 mg/dL (ref 6–24)
CO2: 26 mmol/L (ref 20–29)
Calcium: 9.3 mg/dL (ref 8.7–10.2)
Chloride: 97 mmol/L (ref 96–106)
Creatinine, Ser: 0.94 mg/dL (ref 0.76–1.27)
Glucose: 120 mg/dL — ABNORMAL HIGH (ref 70–99)
Potassium: 3.7 mmol/L (ref 3.5–5.2)
Sodium: 138 mmol/L (ref 134–144)
eGFR: 100 mL/min/{1.73_m2} (ref >59)

## 2021-02-15 LAB — HCV AB W REFLEX TO QUANT PCR: HCV Ab: <0.1 s/co ratio (ref 0.0–0.9)

## 2021-02-16 ENCOUNTER — Telehealth: Payer: Self-pay

## 2021-02-16 NOTE — Telephone Encounter (Signed)
Pt was called and a Vm was left informing patient of normal lab results.  Pt has been sent a my chat message with results.

## 2021-02-16 NOTE — Telephone Encounter (Signed)
-----   Message from Hoy Register, MD sent at 02/15/2021  3:59 PM EST ----- Please inform the patient that labs are normal. Thank you.

## 2021-02-22 ENCOUNTER — Telehealth: Payer: Self-pay

## 2021-02-22 ENCOUNTER — Telehealth: Payer: Self-pay | Admitting: Family Medicine

## 2021-02-22 NOTE — Telephone Encounter (Signed)
Duplicate message. Note has been sent to provider for medication change.

## 2021-02-22 NOTE — Telephone Encounter (Signed)
Pt called to report that his pharmacy is currently out of ozempic. Please advise

## 2021-02-22 NOTE — Telephone Encounter (Signed)
Daniel [name], can you please assist with this because it looks like he uses the pharmacy downstairs.  If Ozempic is not available Is Trulicity available?  Thank you.

## 2021-02-22 NOTE — Telephone Encounter (Signed)
Pt called in requesting to speak with Daniel [name] regarding medications, please advise.  Pt was called and he states that he can not get his Ozempic from his pharmacy. Pt is needing a alternate medication.

## 2021-02-23 ENCOUNTER — Other Ambulatory Visit: Payer: Self-pay

## 2021-02-23 MED ORDER — TRULICITY 0.75 MG/0.5ML ~~LOC~~ SOAJ
0.7500 mg | SUBCUTANEOUS | 6 refills | Status: DC
  Filled 2021-02-23: qty 2, 28d supply, fill #0
  Filled 2021-03-17: qty 2, 28d supply, fill #1
  Filled 2021-04-19: qty 2, 28d supply, fill #2
  Filled 2021-05-23: qty 2, 28d supply, fill #3

## 2021-02-23 NOTE — Telephone Encounter (Signed)
Dr. Alvis Lemmings,   I got suspicious because when there's an issue like that with our pharmacy, they're good about bringing it to our attention. I reached out to [facility] and pt hasn't filled Ozempic here since August.   With that being said, Ozempic is now non-preferred on his insurance. We ran a test claim for the Trulicity and it's $25 for a month. If okay with you, we can change to the 0.75 or 1.5 mg weekly dose since he was on the 0.5 of Ozempic.

## 2021-02-23 NOTE — Addendum Note (Signed)
Addended by: Charlott Rakes on: 02/23/2021 11:32 AM   Modules accepted: Orders

## 2021-02-23 NOTE — Telephone Encounter (Signed)
Have switched his Ozempic to Trulicity.

## 2021-02-24 ENCOUNTER — Other Ambulatory Visit: Payer: Self-pay

## 2021-02-24 ENCOUNTER — Ambulatory Visit: Payer: Self-pay | Admitting: *Deleted

## 2021-02-24 DIAGNOSIS — L0100 Impetigo, unspecified: Secondary | ICD-10-CM

## 2021-02-24 NOTE — Telephone Encounter (Signed)
Sore is still itching and draining and same symptoms and looks a little better but not cleared up, is another round possible? FU to advise [phone] "  Pt is requesting more antibiotics.

## 2021-02-24 NOTE — Telephone Encounter (Signed)
cephALEXin (KEFLEX) 500 MG capsule 14 capsule 0 02/14/2021  Sig - Route: Take 1 capsule (500 mg total) by mouth 2 (two) times daily. - Oral  Sent to pharmacy as: cephALEXin (KEFLEX) 500 MG capsule   Per agent: "Sore is still itching and draining and same symptoms and looks a little better but not cleared up, is another round possible? FU to advise [phone] "   Chief Complaint: Itching at would Symptoms: Back wound "Not healed yet." Itching "So I scratch and scab comes off and it bleeds."  "Might look a bit better but so itchy." Frequency: Started Keflex 02/14/21, completed course Pertinent Negatives: Patient denies fever, purulent drainage, pain Disposition: [] ED /[] Urgent Care (no appt availability in office) / [] Appointment(In office/virtual)/ []  [facility]/ [] Home Care/ [] Refused Recommended Disposition /[] [facility]/ [x]  Follow-up with PCP Additional Notes: Pt requesting another round of antibiotics if PCP thinks appropriate. Assured pt NT would route to practice for PCPs review and final disposition. Advised ED for worsening symptoms.  Reason for Disposition  [1] Finished taking antibiotics AND [2] symptoms are BETTER but [3] not completely gone  Answer Assessment - Initial Assessment Questions 1. INFECTION: "What infection is the antibiotic being given for?"     Sore at lower back 2. ANTIBIOTIC: "What antibiotic are you taking" "How many times per day?"     Keflex 3. DURATION: "When was the antibiotic started?"     02/14/21 4. MAIN CONCERN OR SYMPTOM:  "What is your main concern right now?"     Itching, not healing 5. BETTER-SAME-WORSE: "Are you getting better, staying the same, or getting worse compared to when you first started the antibiotics?" If getting worse, ask: "In what way?"      MAybe a little better 6. FEVER: "Do you have a fever?" If Yes, ask: "What is your temperature, how was it measured, and when did it start?"     no 7. SYMPTOMS: "Are  there any other symptoms you're concerned about?" If Yes, ask: "When did it start?"     Itching, but not healed up.  8. FOLLOW-UP APPOINTMENT: "Do you have a follow-up appointment with your doctor?"  Protocols used: Infection on Antibiotic Follow-up Call-A-AH

## 2021-02-25 ENCOUNTER — Other Ambulatory Visit (HOSPITAL_COMMUNITY): Payer: Self-pay

## 2021-02-25 ENCOUNTER — Other Ambulatory Visit: Payer: Self-pay

## 2021-02-25 MED ORDER — CEPHALEXIN 500 MG PO CAPS
500.0000 mg | ORAL_CAPSULE | Freq: Two times a day (BID) | ORAL | 0 refills | Status: DC
  Filled 2021-02-25: qty 14, 7d supply, fill #0

## 2021-02-25 NOTE — Telephone Encounter (Signed)
Refilled

## 2021-02-25 NOTE — Addendum Note (Signed)
Addended by: Hoy Register on: 02/25/2021 11:01 AM   Modules accepted: Orders

## 2021-02-28 NOTE — Telephone Encounter (Signed)
Pt has been informed of medication being sent to pharmacy. 

## 2021-03-07 DIAGNOSIS — M5136 Other intervertebral disc degeneration, lumbar region: Secondary | ICD-10-CM | POA: Diagnosis not present

## 2021-03-07 DIAGNOSIS — M160 Bilateral primary osteoarthritis of hip: Secondary | ICD-10-CM | POA: Diagnosis not present

## 2021-03-15 ENCOUNTER — Ambulatory Visit: Payer: Self-pay | Admitting: *Deleted

## 2021-03-15 NOTE — Telephone Encounter (Signed)
I returned a call to Rosezetta Schlatter, Emergency contact.   She had called in that there is a rash on his lower back that is painful, been draining for a month now and the antibiotics are not working.  I left a voicemail to call back.

## 2021-03-15 NOTE — Telephone Encounter (Signed)
Appt scheduled for 03/16/21. Patient has already been triaged today . See previous encounter.

## 2021-03-15 NOTE — Telephone Encounter (Signed)
Pt has been placed on schedule

## 2021-03-15 NOTE — Telephone Encounter (Signed)
Patient has been triaged earlier today and appt scheduled for 03/16/21.

## 2021-03-15 NOTE — Telephone Encounter (Signed)
Rash on back already been on two antibiotics that have not worked. This is per pt.   Chief Complaint: rash Symptoms: rash Frequency: constant Pertinent Negatives: Patient denies just on back not spreading Disposition: [] ED /[] Urgent Care (no appt availability in office) / [] Appointment(In office/virtual)/ []  [facility]/ [] Home Care/ [x] Refused Recommended Disposition /[] [facility]/ []  Follow-up with PCP Additional Notes: would only like to see Dr. , she does not have an opening for a while. Pt ws given info about Primary Care at [facility] and he is going to think about going there tomorrow.  Reason for Disposition  [1] Pimples (localized) AND ] no improvement after using CARE ADVICE  Answer Assessment - Initial Assessment Questions 1. APPEARANCE of RASH: "Describe the rash."  Cant see, itches, draining 2. LOCATION: "Where is the rash located?"      Lower back 3. NUMBER: "How many spots are there?"      Two large spots 4. SIZE: "How big are the spots?" (Inches, centimeters or compare to size of a coin)      ? 5. ONSET: "When did the rash start?"      3 weeks 6. ITCHING: "Does the rash itch?" If Yes, ask: "How bad is the itch?"  (Scale 0-10; or none, mild, moderate, severe)     Yes sometimes 7. PAIN: "Does the rash hurt?" If Yes, ask: "How bad is the pain?"  (Scale 0-10; or none, mild, moderate, severe)    - NONE (0): no pain    - MILD (1-3): doesn't interfere with normal activities     - MODERATE (4-7): interferes with normal activities or awakens from sleep     - SEVERE (8-10): excruciating pain, unable to do any normal activities     When scratches 8. OTHER SYMPTOMS: "Do you have any other symptoms?" (e.g., fever)     no 9. PREGNANCY: "Is there any chance you are pregnant?" "When was your last menstrual period?"     na  Protocols used: Rash or Redness - Localized-A-AH

## 2021-03-16 ENCOUNTER — Ambulatory Visit: Payer: BC Managed Care – PPO | Attending: Family Medicine | Admitting: Family Medicine

## 2021-03-16 ENCOUNTER — Other Ambulatory Visit: Payer: Self-pay

## 2021-03-16 ENCOUNTER — Encounter: Payer: Self-pay | Admitting: Family Medicine

## 2021-03-16 VITALS — BP 133/92 | HR 89 | Ht 68.0 in | Wt 282.0 lb

## 2021-03-16 DIAGNOSIS — L0100 Impetigo, unspecified: Secondary | ICD-10-CM | POA: Diagnosis not present

## 2021-03-16 MED ORDER — PREDNISONE 20 MG PO TABS
20.0000 mg | ORAL_TABLET | Freq: Every day | ORAL | 0 refills | Status: DC
  Filled 2021-03-16: qty 5, 5d supply, fill #0

## 2021-03-16 MED ORDER — DOXYCYCLINE HYCLATE 100 MG PO TABS
100.0000 mg | ORAL_TABLET | Freq: Two times a day (BID) | ORAL | 0 refills | Status: DC
  Filled 2021-03-16: qty 20, 10d supply, fill #0

## 2021-03-16 NOTE — Progress Notes (Signed)
Rash on lower back, now painful and itching.

## 2021-03-16 NOTE — Progress Notes (Signed)
Subjective:  Patient ID: Daniel [name], male    DOB: June 30, 1970  Age: 51 y.o. MRN: [id number]  CC: Rash   HPI [name] is a 51 y.o. year old male with a history of Previous alcohol abuse, hypertension, pulmonary embolism (in 02/2018; completed course of anticoagulation) prediabetes (A1c 6.4), bilateral hip osteoarthritis here for an office visit. He had a visit last month with me for chronic disease management.  Interval History: Today he presents complaining of itching, burning, discharge from the site of the rash in his lower back above his gluteal region; he is unsure if discharge is purulent but states it is colored.  He first presented with this rash 1 month ago and has received 2 courses of Keflex however rash only seems to be worsening. Past Medical History:  Diagnosis Date   Asthma    Asthma    Hypertension     Past Surgical History:  Procedure Laterality Date   Dental procedure      Family History  Problem Relation Age of Onset   Heart attack Mother     No Known Allergies  Outpatient Medications Prior to Visit  Medication Sig Dispense Refill   albuterol (VENTOLIN HFA) 108 (90 Base) MCG/ACT inhaler Inhale 1 puff into the lungs every 6 (six) hours as needed for wheezing or shortness of breath. 8.5 g 0   allopurinol (ZYLOPRIM) 300 MG tablet TAKE 1 TABLET (300 MG TOTAL) BY MOUTH 2 (TWO) TIMES DAILY. 180 tablet 1   amLODipine (NORVASC) 5 MG tablet Take 1 tablet (5 mg total) by mouth daily. 90 tablet 1   carvedilol (COREG) 6.25 MG tablet TAKE 1 TABLET (6.25 MG TOTAL) BY MOUTH 2 (TWO) TIMES DAILY WITH A MEAL. 180 tablet 1   celecoxib (CELEBREX) 200 MG capsule Celebrex 200 mg capsule  Take 1 capsule every day by oral route.     Dulaglutide (TRULICITY) 0.75 MG/0.5ML SOPN Inject 0.75 mg into the skin once a week. 2 mL 6   hydrochlorothiazide (HYDRODIURIL) 25 MG tablet Take 1 tablet (25 mg total) by mouth daily. 90 tablet 1   Oxycodone HCl 10 MG TABS oxycodone 10 mg  tablet  Take 1 tablet 3 times a day by oral route as needed.     cephALEXin (KEFLEX) 500 MG capsule Take 1 capsule (500 mg total) by mouth 2 (two) times daily. (Patient not taking: Reported on 03/16/2021) 14 capsule 0   No facility-administered medications prior to visit.     ROS Review of Systems  Constitutional:  Negative for activity change and appetite change.  HENT:  Negative for sinus pressure and sore throat.   Eyes:  Negative for visual disturbance.  Respiratory:  Negative for cough, chest tightness and shortness of breath.   Cardiovascular:  Negative for chest pain and leg swelling.  Gastrointestinal:  Negative for abdominal distention, abdominal pain, constipation and diarrhea.  Endocrine: Negative.   Genitourinary:  Negative for dysuria.  Musculoskeletal:  Negative for joint swelling and myalgias.  Skin:  Positive for rash.  Allergic/Immunologic: Negative.   Neurological:  Negative for weakness, light-headedness and numbness.  Psychiatric/Behavioral:  Negative for dysphoric mood and suicidal ideas.    Objective:  BP (!) 133/92    Pulse 89    Ht 5\' 8"  (1.727 m)    Wt 282 lb (127.9 kg)    SpO2 97%    BMI 42.88 kg/m   BP/Weight 03/16/2021 02/14/2021 11/09/2020  Systolic BP 133 138 150  Diastolic BP 92 92  83  Wt. (Lbs) 282 290 289.4  BMI 42.88 44.09 44  Some encounter information is confidential and restricted. Go to Review Flowsheets activity to see all data.      Physical Exam Constitutional:      Appearance: He is well-developed.  Cardiovascular:     Rate and Rhythm: Normal rate.     Heart sounds: Normal heart sounds. No murmur heard. Pulmonary:     Effort: Pulmonary effort is normal.     Breath sounds: Normal breath sounds. No wheezing or rales.  Chest:     Chest wall: No tenderness.  Abdominal:     General: Bowel sounds are normal. There is no distension.     Palpations: Abdomen is soft. There is no mass.     Tenderness: There is no abdominal tenderness.   Musculoskeletal:        General: Normal range of motion.     Right lower leg: No edema.     Left lower leg: No edema.  Skin:    Comments: See image below -permission obtained from the patient  Neurological:     Mental Status: He is alert and oriented to person, place, and time.  Psychiatric:        Mood and Affect: Mood normal.      CMP Latest Ref Rng & Units 02/14/2021 08/26/2020 03/08/2020  Glucose 70 - 99 mg/dL 161(W) 960(A) 95  BUN 6 - 24 mg/dL 11 10 12   Creatinine 0.76 - 1.27 mg/dL 5.40 9.81  Sodium 134 - 144 mmol/L 138 133(L) 141  Potassium 3.5 - 5.2 mmol/L 3.7 3.9 3.8  Chloride 96 - 106 mmol/L 97 93(L) 101  CO2 20 - 29 mmol/L 26 24 25   Calcium 8.7 - 10.2 mg/dL 9.3 1.91 9.5  Total Protein 6.0 - 8.5 g/dL - - 7.3  Total Bilirubin 0.0 - 1.2 mg/dL - - 0.4  Alkaline Phos 44 - 121 IU/L - - 80  AST 0 - 40 IU/L - - 20  ALT 0 - 44 IU/L - - 11    Lipid Panel     Component Value Date/Time   CHOL 156 08/11/2020 1056   TRIG 88 08/11/2020 1056   HDL 41 08/11/2020 1056   CHOLHDL 3.8 08/11/2020 1056   LDLCALC 98 08/11/2020 1056    CBC    Component Value Date/Time   WBC 7.9 05/28/2019 1622   WBC 10.0 11/24/2018 1242   RBC 4.49 05/28/2019 1622   RBC 4.43 11/24/2018 1242   HGB 15.0 05/28/2019 1622   HCT 43.2 05/28/2019 1622   PLT 306 05/28/2019 1622   MCV 96 05/28/2019 1622   MCH 33.4 (H) 05/28/2019 1622   MCH 32.1 11/24/2018 1242   MCHC 34.7 05/28/2019 1622   MCHC 34.2 11/24/2018 1242   RDW 15.5 (H) 05/28/2019 1622   LYMPHSABS 1.8 05/28/2019 1622   MONOABS 0.9 11/24/2018 1242   EOSABS 0.1 05/28/2019 1622   BASOSABS 0.1 05/28/2019 1622    Lab Results  Component Value Date   HGBA1C 6.4 02/14/2021    Assessment & Plan:  1. Impetigo Duration of 1 month-not improving Placed on doxycycline Will refer to infectious disease and dermatology - predniSONE (DELTASONE) 20 MG tablet; Take 1 tablet (20 mg total) by mouth daily with breakfast.  Dispense: 5 tablet;  Refill: 0 - Ambulatory referral to Infectious Disease - Ambulatory referral to Dermatology - doxycycline (VIBRA-TABS) 100 MG tablet; Take 1 tablet (100 mg total) by mouth 2 (two) times daily.  Dispense: 20 tablet; Refill: 0    Meds ordered this encounter  Medications   predniSONE (DELTASONE) 20 MG tablet    Sig: Take 1 tablet (20 mg total) by mouth daily with breakfast.    Dispense:  5 tablet    Refill:  0   doxycycline (VIBRA-TABS) 100 MG tablet    Sig: Take 1 tablet (100 mg total) by mouth 2 (two) times daily.    Dispense:  20 tablet    Refill:  0    Follow-up: Return for follow up, keep previously scheduled appointment.       Hoy Register, MD, FAAFP. [facility] [place], [place] [phone]   03/16/2021, 10:26 AM

## 2021-03-18 ENCOUNTER — Other Ambulatory Visit: Payer: Self-pay

## 2021-03-23 ENCOUNTER — Other Ambulatory Visit: Payer: Self-pay

## 2021-03-24 ENCOUNTER — Other Ambulatory Visit: Payer: Self-pay

## 2021-03-30 ENCOUNTER — Other Ambulatory Visit: Payer: Self-pay

## 2021-03-30 ENCOUNTER — Ambulatory Visit (INDEPENDENT_AMBULATORY_CARE_PROVIDER_SITE_OTHER): Payer: BC Managed Care – PPO | Admitting: Internal Medicine

## 2021-03-30 ENCOUNTER — Encounter: Payer: Self-pay | Admitting: Internal Medicine

## 2021-03-30 VITALS — BP 123/82 | HR 93 | Temp 98.5°F | Ht 69.0 in | Wt 283.0 lb

## 2021-03-30 DIAGNOSIS — B354 Tinea corporis: Secondary | ICD-10-CM

## 2021-03-30 DIAGNOSIS — R21 Rash and other nonspecific skin eruption: Secondary | ICD-10-CM | POA: Diagnosis not present

## 2021-03-30 DIAGNOSIS — B350 Tinea barbae and tinea capitis: Secondary | ICD-10-CM | POA: Diagnosis not present

## 2021-03-30 MED ORDER — TERBINAFINE HCL 250 MG PO TABS
250.0000 mg | ORAL_TABLET | Freq: Every day | ORAL | 0 refills | Status: DC
Start: 2021-03-30 — End: 2021-04-26
  Filled 2021-03-30: qty 30, 30d supply, fill #0

## 2021-03-30 NOTE — Patient Instructions (Addendum)
You appear to have a topical fungal infection of your skin. This doesn't look like cellulitis/impetigo ? ?Other considerations (as they can look the same) are eczema/atopic dermatitis, psoriasis, or inflammatory condition ? ?If in 2-3 weeks you dont get better with fungal medication, then we'll need to get you to dermatology to get skin biopsy ? ? ?For treatment: ?Take terbinafine tablet 1 tab once a day for the next 4 weeks ?Use over the counter shampoo (typically named selsun blue OR nizoral), but any shampoo with this active ingredient SELENIUM SULFIDE or KETOCONAZOLE . ?Apply the shampoo twice a day, leave for a few minutes then wash off ?All members of family should use for the next 2-4 weeks ?Wash all bedsheets in hot water once a week for the next 4 weeks ? ? ?See me in 2-3 weeks for follow up ?Avoid alcohol ?Call me if any new rash, nausea, vomiting, joint pain, fatigue occurring with the oral terbinafine tablet ?

## 2021-03-30 NOTE — Progress Notes (Signed)
?  ? ? ? ? ?[facility] ? ?Reason for Consult:rash ?Referring Provider: Margarita Rana, MD ? ? ? ?Patient Active Problem List  ? Diagnosis Date Noted  ? Hypomagnesemia 03/03/2018  ? HTN (hypertension) 03/02/2018  ? Alcoholic hepatitis without ascites 03/02/2018  ? Chronic anemia 03/02/2018  ? Alcohol abuse 03/01/2018  ? Tobacco abuse 03/01/2018  ? Asthma 03/01/2018  ? Exacerbation of asthma   ? Alcohol dependence ([place]) 09/05/2012  ? ? ? ? ?HPI: Daniel [name] is a 51 y.o. male alcohol abuse, htn/hlp, hx PE 2020 s/p a course of anticoagulation, djd, asthma, referred by pcp here for rash ? ?He was seen by his pcp on 03/16/2020 for his lower back itchy burning rash. There was some discharge although he didn't know if it was purulent. It was there for a month prior to seeing his pcp. He had 2 courses of cephalexin prior to seeing his pcp on 2/22. At that ttime he thought the rash was getting worse ? ?I reviewed the photo from the epic media tab. Well demarcated plaque x2 with maceration geographically within the border, associated with crusting. ? ?He hasn't had fever or chill ? ?He said the main feature is that it iches a lot. No prior episode of same. No family members with same ? ?He has hx of eczema/asthma ?He denies hx or family hx psoriasis ? ?He feels well otherwise ? ?He has no liver disease ? ? ? ?Review of Systems: ?ROS ? ?All other ros negative ? ? ? ? ?Past Medical History:  ?Diagnosis Date  ? Asthma   ? Asthma   ? Hypertension   ? ? ?Social History  ? ?Tobacco Use  ? Smoking status: Some Days  ?  Packs/day: 0.50  ?  Types: Cigarettes  ? Smokeless tobacco: Never  ?Vaping Use  ? Vaping Use: Never used  ?Substance Use Topics  ? Alcohol use: Not Currently  ? Drug use: Not Currently  ? ? ?Family History  ?Problem Relation Age of Onset  ? Heart attack Mother   ? ? ?No Known Allergies ? ?OBJECTIVE: ?There were no vitals filed for this visit. ?There is no height or weight on file to calculate  BMI. ? ? ?Physical Exam ?General/constitutional: no distress, pleasant; obese ?HEENT: Normocephalic, PER, Conj Clear, EOMI, Oropharynx clear ?Neck supple ?CV: rrr no mrg ?Lungs: clear to auscultation, normal respiratory effort ?Abd: Soft, Nontender ?Ext: no edema ?Skin: well demarcated heaped up plaque like rash with well demarcated border and central maceration/crusting ?Neuro: nonfocal ?MSK: no peripheral joint swelling/tenderness/warmth; back spines nontender ? ? ?3/08 rash ? ? ? ? ?Lab: ?Lab Results  ?Component Value Date  ? WBC 7.9 05/28/2019  ? HGB 15.0 05/28/2019  ? HCT 43.2 05/28/2019  ? MCV 96 05/28/2019  ? PLT 306 05/28/2019  ? ?Last metabolic panel ?Lab Results  ?Component Value Date  ? GLUCOSE 120 (H) 02/14/2021  ? NA 138 02/14/2021  ? K 3.7 02/14/2021  ? CL 97 02/14/2021  ? CO2 26 02/14/2021  ? BUN 11 02/14/2021  ? CREATININE 0.94 02/14/2021  ? EGFR 100 02/14/2021  ? CALCIUM 9.3 02/14/2021  ? PHOS 4.7 (H) 01/07/2009  ? PROT 7.3 03/08/2020  ? ALBUMIN 4.4 03/08/2020  ? LABGLOB 2.9 03/08/2020  ? AGRATIO 1.5 03/08/2020  ? BILITOT 0.4 03/08/2020  ? ALKPHOS 80 03/08/2020  ? AST 20 03/08/2020  ? ALT 11 03/08/2020  ? ANIONGAP 12 11/24/2018  ? ? ?Microbiology: ? ?Serology: ? ?Imaging: ? ? ?  Assessment/plan: ?Problem List Items Addressed This Visit   ?None ?Visit Diagnoses   ? ? Rash    -  Primary  ? Kerion      ? Relevant Medications  ? terbinafine (LAMISIL) 250 MG tablet  ? Tinea corporis      ? Relevant Medications  ? terbinafine (LAMISIL) 250 MG tablet  ? ?  ? ? ?Looks/behaves like tinea corporis with inflammatory reaction, ie kerion ? ? ?From AVS: ?You appear to have a topical fungal infection of your skin. This doesn't look like cellulitis/impetigo ? ?Other considerations (as they can look the same) are eczema/atopic dermatitis, psoriasis, or inflammatory condition ? ?If in 2-3 weeks you dont get better with fungal medication, then we'll need to get you to dermatology to get skin biopsy ? ? ?For  treatment: ?Take terbinafine tablet 1 tab once a day for the next 4 weeks ?Use over the counter shampoo (typically named selsun blue OR nizoral), but any shampoo with this active ingredient SELENIUM SULFIDE or KETOCONAZOLE . ?Apply the shampoo twice a day, leave for a few minutes then wash off ?All members of family should use for the next 2-4 weeks ?Wash all bedsheets in hot water once a week for the next 4 weeks ? ? ?See me in 2-3 weeks for follow up ?Avoid alcohol ?Call me if any new rash, nausea, vomiting, joint pain, fatigue occurring with the oral terbinafine tablet ? ? ? ? ? ? ?Follow-up: Return in about 3 weeks (around 04/20/2021). ? ?Jabier Mutton, MD ?[facility] ?[place] ?[phone] pager   [phone] cell ?03/30/2021, 8:59 AM ? ?

## 2021-04-01 ENCOUNTER — Other Ambulatory Visit: Payer: Self-pay

## 2021-04-13 ENCOUNTER — Telehealth: Payer: Self-pay | Admitting: *Deleted

## 2021-04-13 NOTE — Telephone Encounter (Signed)
Copied from [place] [id number]. Topic: General - Other ?>> Apr 12, 2021 11:43 AM Tessa Lerner A wrote: ?Reason for CRM: The patient('s wife) would like to speak with Alycia when possible  ? ?The patient's wife declined to elaborate at th time of call with agent  ? ?Please contact further ?

## 2021-04-14 NOTE — Telephone Encounter (Signed)
Pt's wife was called and she states that patient has been having some burning urination after finishing his antibiotics. She was informed for patient to come in and drop off urine sample for testing. Results will be placed in chart once sample is dropped off. ?

## 2021-04-19 ENCOUNTER — Other Ambulatory Visit: Payer: Self-pay

## 2021-04-21 ENCOUNTER — Other Ambulatory Visit: Payer: Self-pay

## 2021-04-21 ENCOUNTER — Other Ambulatory Visit: Payer: Self-pay | Admitting: Family Medicine

## 2021-04-21 DIAGNOSIS — I10 Essential (primary) hypertension: Secondary | ICD-10-CM

## 2021-04-22 ENCOUNTER — Other Ambulatory Visit: Payer: Self-pay | Admitting: Family Medicine

## 2021-04-22 DIAGNOSIS — I10 Essential (primary) hypertension: Secondary | ICD-10-CM

## 2021-04-26 ENCOUNTER — Other Ambulatory Visit: Payer: Self-pay

## 2021-04-26 ENCOUNTER — Encounter: Payer: Self-pay | Admitting: Internal Medicine

## 2021-04-26 ENCOUNTER — Ambulatory Visit (INDEPENDENT_AMBULATORY_CARE_PROVIDER_SITE_OTHER): Payer: BC Managed Care – PPO | Admitting: Internal Medicine

## 2021-04-26 ENCOUNTER — Other Ambulatory Visit (HOSPITAL_COMMUNITY): Payer: Self-pay

## 2021-04-26 VITALS — BP 141/94 | HR 97 | Temp 97.8°F | Resp 16 | Ht 69.0 in | Wt 282.2 lb

## 2021-04-26 DIAGNOSIS — R21 Rash and other nonspecific skin eruption: Secondary | ICD-10-CM

## 2021-04-26 MED ORDER — TERBINAFINE HCL 250 MG PO TABS
250.0000 mg | ORAL_TABLET | Freq: Every day | ORAL | 0 refills | Status: AC
  Filled 2021-04-26: qty 28, 28d supply, fill #0

## 2021-04-26 MED ORDER — CLOBETASOL PROPIONATE 0.05 % EX CREA
1.0000 "application " | TOPICAL_CREAM | Freq: Two times a day (BID) | CUTANEOUS | 0 refills | Status: DC
  Filled 2021-04-26: qty 30, 15d supply, fill #0

## 2021-04-26 NOTE — Progress Notes (Signed)
?  ? ? ? ? ?[facility] ? ?Reason for Consult:rash ?Referring Provider: Margarita Rana, MD ? ? ? ?Patient Active Problem List  ? Diagnosis Date Noted  ? Hypomagnesemia 03/03/2018  ? HTN (hypertension) 03/02/2018  ? Alcoholic hepatitis without ascites 03/02/2018  ? Chronic anemia 03/02/2018  ? Alcohol abuse 03/01/2018  ? Tobacco abuse 03/01/2018  ? Asthma 03/01/2018  ? Exacerbation of asthma   ? Alcohol dependence ([place]) 09/05/2012  ? ? ?Cc: f/u rash ? ? ?HPI: Daniel [name] is a 51 y.o. male alcohol abuse, htn/hlp, hx PE 2020 s/p a course of anticoagulation, djd, asthma, referred by pcp here for rash. He is here today for treatment follow up ? ? ?04/26/21  ?Patient still have the rash and it still itch ?Wife accompanies him today -- she thinks it looks better ?I reviewed picture from previously and it does look less heaped and appears better ? ?He has another itchy lesion on the left leg at the flexor crease - onset at around the same time but now itchy ?His wife doesn't have any issue with skin rash ?He has no prior psoriasis ? ? ? ?I first evaluated him 3/08 ?----------- ?He was seen by his pcp on 03/16/2020 for his lower back itchy burning rash. There was some discharge although he didn't know if it was purulent. It was there for a month prior to seeing his pcp. He had 2 courses of cephalexin prior to seeing his pcp on 2/22. At that ttime he thought the rash was getting worse ? ?I reviewed the photo from the epic media tab. Well demarcated plaque x2 with maceration geographically within the border, associated with crusting. ? ?He hasn't had fever or chill ? ?He said the main feature is that it iches a lot. No prior episode of same. No family members with same ? ?He has hx of eczema/asthma ?He denies hx or family hx psoriasis ? ?He feels well otherwise ? ?He has no liver disease ? ? ? ?Review of Systems: ?ROS ? ?All other ros negative ? ? ? ? ?Past Medical History:  ?Diagnosis Date  ? Asthma   ?  Asthma   ? Hypertension   ? ? ?Social History  ? ?Tobacco Use  ? Smoking status: Some Days  ?  Packs/day: 0.30  ?  Types: Cigarettes  ? Smokeless tobacco: Never  ?Vaping Use  ? Vaping Use: Never used  ?Substance Use Topics  ? Alcohol use: Not Currently  ? Drug use: Not Currently  ? ? ?Family History  ?Problem Relation Age of Onset  ? Heart attack Mother   ? ? ?No Known Allergies ? ?OBJECTIVE: ?Vitals:  ? 04/26/21 1031  ?BP: (!) 141/94  ?Pulse: 97  ?Resp: 16  ?Temp: 97.8 ?F (36.6 ?C)  ?TempSrc: Temporal  ?SpO2: 98%  ?Weight: 282 lb 3.2 oz (128 kg)  ?Height: 5' 9" (1.753 m)  ? ?Body mass index is 41.67 kg/m?. ? ? ?Physical Exam ?General/constitutional: no distress, pleasant ?HEENT: Normocephalic, PER, Conj Clear, EOMI, Oropharynx clear ?Neck supple ?CV: rrr no mrg ?Lungs: clear to auscultation, normal respiratory effort ?Abd: Soft, Nontender ?Ext: no edema ?Skin: see below -- less heaped up rash. Stable size  ?Neuro: nonfocal ?MSK: no peripheral joint swelling/tenderness/warmth; back spines nontender ? ? ?04/26/21 picture ? ? ? ? ? ?Lab: ?Lab Results  ?Component Value Date  ? WBC 7.9 05/28/2019  ? HGB 15.0 05/28/2019  ? HCT 43.2 05/28/2019  ? MCV 96 05/28/2019  ?  PLT 306 05/28/2019  ? ?Last metabolic panel ?Lab Results  ?Component Value Date  ? GLUCOSE 120 (H) 02/14/2021  ? NA 138 02/14/2021  ? K 3.7 02/14/2021  ? CL 97 02/14/2021  ? CO2 26 02/14/2021  ? BUN 11 02/14/2021  ? CREATININE 0.94 02/14/2021  ? EGFR 100 02/14/2021  ? CALCIUM 9.3 02/14/2021  ? PHOS 4.7 (H) 01/07/2009  ? PROT 7.3 03/08/2020  ? ALBUMIN 4.4 03/08/2020  ? LABGLOB 2.9 03/08/2020  ? AGRATIO 1.5 03/08/2020  ? BILITOT 0.4 03/08/2020  ? ALKPHOS 80 03/08/2020  ? AST 20 03/08/2020  ? ALT 11 03/08/2020  ? ANIONGAP 12 11/24/2018  ? ? ?Microbiology: ? ?Serology: ? ?Imaging: ? ? ?Assessment/plan: ?Problem List Items Addressed This Visit   ?None ?Visit Diagnoses   ? ? Rash and nonspecific skin eruption    -  Primary  ? ?  ? ? ?3/08 assessment ?Looks/behaves  like tinea corporis with inflammatory reaction, ie kerion ? ? ?From AVS: ?You appear to have a topical fungal infection of your skin. This doesn't look like cellulitis/impetigo ? ?Other considerations (as they can look the same) are eczema/atopic dermatitis, psoriasis, or inflammatory condition ? ?If in 2-3 weeks you dont get better with fungal medication, then we'll need to get you to dermatology to get skin biopsy ? ? ?For treatment: ?Take terbinafine tablet 1 tab once a day for the next 4 weeks ?Use over the counter shampoo (typically named selsun blue OR nizoral), but any shampoo with this active ingredient SELENIUM SULFIDE or KETOCONAZOLE . ?Apply the shampoo twice a day, leave for a few minutes then wash off ?All members of family should use for the next 2-4 weeks ?Wash all bedsheets in hot water once a week for the next 4 weeks ? ? ?See me in 2-3 weeks for follow up ?Avoid alcohol ?Call me if any new rash, nausea, vomiting, joint pain, fatigue occurring with the oral terbinafine tablet ? ? ?4/04 assessment ?Slight improvement or relatively stable in the buttock rash. I am not awared bout the LLE rash ? ?But today considering psoriasis as one of the ddx ? ?He should have biopsy of the lesion by dermatology ? ?In the mean time, will rx steroid cream for the buttock lesion given its severe itch. Will also continue another 4 weeks but no more of terbinafine ?I am ok if he continues selsun shampoo ? ?Dermatology referral ?Follow up in another 4 weeks ?Will need blood count to monitor for side effect of terbinafine ? ?Follow-up: Return in about 6 weeks (around 06/07/2021). ? ? T , MD ?[facility] ?[facility] ?[phone] pager   [phone] cell ?04/26/2021, 10:38 AM ? ?

## 2021-04-26 NOTE — Patient Instructions (Addendum)
Continue 4 more weeks of terbinafine ? ?Use clobetasol cream for the buttock rash ? ?You can continue to use selsun shampoo if you like ? ?I have placed another derm referral, this time to [facility] to see if you can get in sooner ? ?See me around 5-6 weeks ? ?

## 2021-04-27 LAB — COMPLETE METABOLIC PANEL WITH GFR
AG Ratio: 1.2 (calc) (ref 1.0–2.5)
ALT: 8 U/L — ABNORMAL LOW (ref 9–46)
AST: 14 U/L (ref 10–40)
Albumin: 3.8 g/dL (ref 3.6–5.1)
Alkaline phosphatase (APISO): 68 U/L (ref 36–130)
BUN: 11 mg/dL (ref 7–25)
CO2: 30 mmol/L (ref 20–32)
Calcium: 9.5 mg/dL (ref 8.6–10.3)
Chloride: 101 mmol/L (ref 98–110)
Creat: 0.84 mg/dL (ref 0.60–1.29)
Globulin: 3.3 g/dL (calc) (ref 1.9–3.7)
Glucose, Bld: 103 mg/dL — ABNORMAL HIGH (ref 65–99)
Potassium: 3.7 mmol/L (ref 3.5–5.3)
Sodium: 139 mmol/L (ref 135–146)
Total Bilirubin: 0.6 mg/dL (ref 0.2–1.2)
Total Protein: 7.1 g/dL (ref 6.1–8.1)
eGFR: 108 mL/min/{1.73_m2} (ref >=60)

## 2021-04-27 LAB — CBC
HCT: 43.1 % (ref 38.5–50.0)
Hemoglobin: 14.5 g/dL (ref 13.2–17.1)
MCH: 31.1 pg (ref 27.0–33.0)
MCHC: 33.6 g/dL (ref 32.0–36.0)
MCV: 92.5 fL (ref 80.0–100.0)
MPV: 10.2 fL (ref 7.5–12.5)
Platelets: 315 10*3/uL (ref 140–400)
RBC: 4.66 10*6/uL (ref 4.20–5.80)
RDW: 13.7 % (ref 11.0–15.0)
WBC: 9.3 10*3/uL (ref 3.8–10.8)

## 2021-05-23 ENCOUNTER — Other Ambulatory Visit: Payer: Self-pay

## 2021-05-25 ENCOUNTER — Other Ambulatory Visit: Payer: Self-pay

## 2021-06-03 ENCOUNTER — Ambulatory Visit: Payer: Self-pay | Admitting: *Deleted

## 2021-06-03 MED ORDER — OZEMPIC (0.25 OR 0.5 MG/DOSE) 2 MG/1.5ML ~~LOC~~ SOPN: 0.2500 mg | PEN_INJECTOR | SUBCUTANEOUS | 6 refills | Status: DC

## 2021-06-03 NOTE — Addendum Note (Signed)
Addended byHoy Register on: 06/03/2021 06:25 PM ? ? Modules accepted: Orders ? ?

## 2021-06-03 NOTE — Telephone Encounter (Signed)
I have changed it to Ozempic ?

## 2021-06-03 NOTE — Telephone Encounter (Signed)
I returned pt's call.  Been on Trulicity for 2-3 months now.  He is having a cloudy discharge coming out and it itches after he takes the shot.   It also leaves a knot.   He wants to go back on Ozempic.   ?Reason for Disposition ? [1] Caller has URGENT medicine question about med that PCP or specialist prescribed AND [2] triager unable to answer question ? ?Answer Assessment - Initial Assessment Questions ?1. NAME of MEDICATION: "What medicine are you calling about?" ?    Trulicity ?2. QUESTION: "What is your question?" (e.g., double dose of medicine, side effect) ?    There is cloudy discharge and itching after the shot and it leaves a knot on my stomach.    I want to go back on Ozempic.   ?Can Dr. Alvis Lemmings call and explain what the Trulicity is doing and see if there is a way I can go back on the Ozempic?   She changed me because it cost too much but can something be done?    I did good on the Ozempic. ?3. PRESCRIBING HCP: "Who prescribed it?" Reason: if prescribed by specialist, call should be referred to that group. ?    Dr. Alvis Lemmings ?4. SYMPTOMS: "Do you have any symptoms?" ?    Cloudy discharge, itching at the site and a knot at the injection site after every time I take a shot.  ?5. SEVERITY: If symptoms are present, ask "Are they mild, moderate or severe?" ?    After every shot of Trulicity ?6. PREGNANCY:  "Is there any chance that you are pregnant?" "When was your last menstrual period?" ?    N/A ? ?Protocols used: Medication Question Call-A-AH ? ?

## 2021-06-03 NOTE — Telephone Encounter (Signed)
?  Chief Complaint: Trulicity giving him problems ?Symptoms: cloudy discharge, itching and a knot at the injection site every time he gets a shot.   Wants to go back on Ozempic. ?Frequency: Every time he gets a shot ?Pertinent Negatives: Patient denies the Ozempic doing this.    ?Disposition: [] ED /[] Urgent Care (no appt availability in office) / [] Appointment(In office/virtual)/ []  [facility]/ [] Home Care/ [] Refused Recommended Disposition /[] [facility]/ [x]  Follow-up with PCP ?Additional Notes: Message sent to Dr. with his request.  Pt agreeable to someone calling him back.  ?

## 2021-06-06 ENCOUNTER — Telehealth: Payer: Self-pay | Admitting: Family Medicine

## 2021-06-06 ENCOUNTER — Other Ambulatory Visit: Payer: Self-pay

## 2021-06-06 DIAGNOSIS — Z79891 Long term (current) use of opiate analgesic: Secondary | ICD-10-CM | POA: Diagnosis not present

## 2021-06-06 DIAGNOSIS — M25552 Pain in left hip: Secondary | ICD-10-CM | POA: Diagnosis not present

## 2021-06-06 DIAGNOSIS — M25551 Pain in right hip: Secondary | ICD-10-CM | POA: Diagnosis not present

## 2021-06-06 DIAGNOSIS — M5459 Other low back pain: Secondary | ICD-10-CM | POA: Diagnosis not present

## 2021-06-06 MED ORDER — OZEMPIC (0.25 OR 0.5 MG/DOSE) 2 MG/3ML ~~LOC~~ SOPN
0.2500 mg | PEN_INJECTOR | SUBCUTANEOUS | 6 refills | Status: DC
  Filled 2021-06-06: qty 3, 28d supply, fill #0

## 2021-06-06 NOTE — Telephone Encounter (Signed)
Called patient aware of PCP message that Rx was phoned in to pharmacy. Unsure which pharmacy to direct patient to. Advised he would need to call the 3 that he uses.  ?

## 2021-06-06 NOTE — Telephone Encounter (Signed)
Sent it to the community pharmacy [facility]. ?

## 2021-06-06 NOTE — Telephone Encounter (Signed)
Pt called in to ask which pharmacy was his Semaglutide,0.25 or 0.5MG /DOS, (OZEMPIC, 0.25 OR 0.5 MG/DOSE,) 2 MG/1.5ML SOPN  sent to? Showing Rx was sent but not showing which pharmacy.  ? ? ?Please advise pt further.  ? ? ? ?

## 2021-06-06 NOTE — Telephone Encounter (Signed)
Patient aware.

## 2021-06-07 ENCOUNTER — Other Ambulatory Visit: Payer: Self-pay

## 2021-06-08 ENCOUNTER — Other Ambulatory Visit: Payer: Self-pay | Admitting: Pharmacist

## 2021-06-08 ENCOUNTER — Other Ambulatory Visit: Payer: Self-pay

## 2021-06-08 DIAGNOSIS — M1A00X0 Idiopathic chronic gout, unspecified site, without tophus (tophi): Secondary | ICD-10-CM

## 2021-06-08 MED ORDER — ALLOPURINOL 300 MG PO TABS: ORAL_TABLET | ORAL | 0 refills | Status: DC

## 2021-06-14 ENCOUNTER — Ambulatory Visit (INDEPENDENT_AMBULATORY_CARE_PROVIDER_SITE_OTHER): Payer: BC Managed Care – PPO | Admitting: Internal Medicine

## 2021-06-14 ENCOUNTER — Other Ambulatory Visit: Payer: Self-pay

## 2021-06-14 ENCOUNTER — Encounter: Payer: Self-pay | Admitting: Internal Medicine

## 2021-06-14 VITALS — BP 133/90 | HR 98 | Temp 98.0°F | Wt 281.8 lb

## 2021-06-14 DIAGNOSIS — R21 Rash and other nonspecific skin eruption: Secondary | ICD-10-CM | POA: Diagnosis not present

## 2021-06-14 NOTE — Progress Notes (Signed)
[facility] Infect[facility]  Reason for Consult:rash Referring Provider: Margarita Rana, MD    Patient Active Problem List   Diagnosis Date Noted   Hypomagnesemia 03/03/2018   HTN (hypertension) 58/30/9407   Alcoholic hepatitis without ascites 03/02/2018   Chronic anemia 03/02/2018   Alcohol abuse 03/01/2018   Tobacco abuse 03/01/2018   Asthma 03/01/2018   Exacerbation of asthma    Alcohol dependence ([place]) 09/05/2012    Cc: f/u rash   HPI: Daniel [name] is a 51 y.o. male alcohol abuse, htn/hlp, hx PE 2020 s/p a course of anticoagulation, djd, asthma, referred by pcp here for rash. He is here today for treatment follow up   06/14/21 id clinic f/u Patient didn't go to dermatology Said the lesions are gone Was using clobetasol I gave last time and continued on extra month terbinafine tablet a week prior to this visit No f/c No n/v No rash that is new  Appetite intact   04/26/21 id clinic f/u  Patient still have the rash and it still itch Wife accompanies him today -- she thinks it looks better I reviewed picture from previously and it does look less heaped and appears better  He has another itchy lesion on the left leg at the flexor crease - onset at around the same time but now itchy His wife doesn't have any issue with skin rash He has no prior psoriasis    I first evaluated him 3/08 ----------- He was seen by his pcp on 03/16/2020 for his lower back itchy burning rash. There was some discharge although he didn't know if it was purulent. It was there for a month prior to seeing his pcp. He had 2 courses of cephalexin prior to seeing his pcp on 2/22. At that ttime he thought the rash was getting worse  I reviewed the photo from the epic media tab. Well demarcated plaque x2 with maceration geographically within the border, associated with crusting.  He hasn't had fever or chill  He said the main feature is that it iches a lot. No prior episode of  same. No family members with same  He has hx of eczema/asthma He denies hx or family hx psoriasis  He feels well otherwise  He has no liver disease    Review of Systems: ROS  All other ros negative     Past Medical History:  Diagnosis Date   Asthma    Asthma    Hypertension     Social History   Tobacco Use   Smoking status: Some Days    Packs/day: 0.30    Types: Cigarettes   Smokeless tobacco: Never  Vaping Use   Vaping Use: Never used  Substance Use Topics   Alcohol use: Not Currently   Drug use: Not Currently    Family History  Problem Relation Age of Onset   Heart attack Mother     No Known Allergies  OBJECTIVE: Vitals:   06/14/21 0923  Weight: 281 lb 12.8 oz (127.8 kg)   Body mass index is 41.61 kg/m.   Physical Exam General/constitutional: no distress, pleasant HEENT: Normocephalic, PER, Conj Clear, EOMI, Oropharynx clear Neck supple CV: rrr no mrg Lungs: clear to auscultation, normal respiratory effort Abd: Soft, Nontender Ext: no edema Skin: No Rash Neuro: nonfocal MSK: no peripheral joint swelling/tenderness/warmth; back spines nontender     06/14/21 picture         Lab: Lab Results  Component Value Date  WBC 9.3 04/26/2021   HGB 14.5 04/26/2021   HCT 43.1 04/26/2021   MCV 92.5 04/26/2021   PLT 315 26/71/2458   Last metabolic panel Lab Results  Component Value Date   GLUCOSE 103 (H) 04/26/2021   NA 139 04/26/2021   K 3.7 04/26/2021   CL 101 04/26/2021   CO2 30 04/26/2021   BUN 11 04/26/2021   CREATININE 0.84 04/26/2021   EGFR 108 04/26/2021   CALCIUM 9.5 04/26/2021   PHOS 4.7 (H) 01/07/2009   PROT 7.1 04/26/2021   ALBUMIN 4.4 03/08/2020   LABGLOB 2.9 03/08/2020   AGRATIO 1.5 03/08/2020   BILITOT 0.6 04/26/2021   ALKPHOS 80 03/08/2020   AST 14 04/26/2021   ALT 8 (L) 04/26/2021   ANIONGAP 12 11/24/2018    Microbiology:  Serology:  Imaging:   Assessment/plan: Problem List Items Addressed This  Visit   None  3/08 assessment Looks/behaves like tinea corporis with inflammatory reaction, ie kerion   From AVS: You appear to have a topical fungal infection of your skin. This doesn't look like cellulitis/impetigo  Other considerations (as they can look the same) are eczema/atopic dermatitis, psoriasis, or inflammatory condition  If in 2-3 weeks you dont get better with fungal medication, then we'll need to get you to dermatology to get skin biopsy   For treatment: Take terbinafine tablet 1 tab once a day for the next 4 weeks Use over the counter shampoo (typically named selsun blue OR nizoral), but any shampoo with this active ingredient SELENIUM SULFIDE or KETOCONAZOLE . Apply the shampoo twice a day, leave for a few minutes then wash off All members of family should use for the next 2-4 weeks Wash all bedsheets in hot water once a week for the next 4 weeks   See me in 2-3 weeks for follow up Avoid alcohol Call me if any new rash, nausea, vomiting, joint pain, fatigue occurring with the oral terbinafine tablet   4/04 assessment Slight improvement or relatively stable in the buttock rash. I am not awared bout the LLE rash  But today considering psoriasis as one of the ddx  He should have biopsy of the lesion by dermatology  In the mean time, will rx steroid cream for the buttock lesion given its severe itch. Will also continue another 4 weeks but no more of terbinafine I am ok if he continues selsun shampoo  Dermatology referral Follow up in another 4 weeks Will need blood count to monitor for side effect of terbinafine   5/23 id assessment He was continued on terbinafine another month and also topical clobetasol from April visit. I had suspected it could be psoriasis given no significant improvement on 1 month of terbinafine previously. Other ddx is ezcema  At this time the significant response is seen likely from clobetasol cream  I discussed with him that if  this is psoriasis there could be systemic implication and he'll need to see dermatology if this recur to achieve firmer diagnosis  Eczema can also recur but likely will be just skin manifestation  Both eczema and skin isolated psoriasis can be treated with topical steroid cream and he can continue to seek care with his PCP  No need to follow up with id clinic    I have spent a total of 30 minutes of face-to-face and non-face-to-face time, excluding clinical staff time, preparing to see patient, ordering tests and/or medications, and provide counseling the patient   Follow-up: No follow-ups on file.  [place] T  [place], [place] for [place] [phone] pager   [phone] cell 06/14/2021, 9:24 AM

## 2021-06-14 NOTE — Patient Instructions (Signed)
I do not think you have a fungal infection. Your response seems to suggest that this is either psoriasis or eczema: please see below   This is my assessment for you (from my note) ----------- At this time the significant response is seen likely from clobetasol cream  I discussed with him that if this is psoriasis there could be systemic implication and he'll need to see dermatology if this recur to achieve firmer diagnosis  Eczema can also recur but likely will be just skin manifestation  Both eczema and skin isolated psoriasis can be treated with topical steroid cream and he can continue to seek care with his PCP  No need to follow up with id clinic

## 2021-06-21 ENCOUNTER — Ambulatory Visit: Payer: BC Managed Care – PPO | Admitting: Family Medicine

## 2021-07-06 ENCOUNTER — Other Ambulatory Visit: Payer: Self-pay | Admitting: Pharmacist

## 2021-07-06 ENCOUNTER — Encounter: Payer: Self-pay | Admitting: Family Medicine

## 2021-07-06 ENCOUNTER — Ambulatory Visit: Payer: BC Managed Care – PPO | Attending: Family Medicine | Admitting: Family Medicine

## 2021-07-06 ENCOUNTER — Other Ambulatory Visit: Payer: Self-pay

## 2021-07-06 VITALS — BP 129/85 | HR 95 | Temp 98.2°F | Ht 69.0 in | Wt 280.6 lb

## 2021-07-06 DIAGNOSIS — R7303 Prediabetes: Secondary | ICD-10-CM | POA: Diagnosis not present

## 2021-07-06 DIAGNOSIS — Z6841 Body Mass Index (BMI) 40.0 and over, adult: Secondary | ICD-10-CM

## 2021-07-06 DIAGNOSIS — M160 Bilateral primary osteoarthritis of hip: Secondary | ICD-10-CM | POA: Diagnosis not present

## 2021-07-06 DIAGNOSIS — I10 Essential (primary) hypertension: Secondary | ICD-10-CM

## 2021-07-06 LAB — POCT GLYCOSYLATED HEMOGLOBIN (HGB A1C): HbA1c, POC (controlled diabetic range): 6.4 % (ref 0.0–7.0)

## 2021-07-06 LAB — GLUCOSE, POCT (MANUAL RESULT ENTRY): POC Glucose: 128 mg/dl — AB (ref 70–99)

## 2021-07-06 MED ORDER — OZEMPIC (0.25 OR 0.5 MG/DOSE) 2 MG/1.5ML ~~LOC~~ SOPN
0.5000 mg | PEN_INJECTOR | SUBCUTANEOUS | 6 refills | Status: DC
  Filled 2021-07-06: qty 1.5, 28d supply, fill #0

## 2021-07-06 MED ORDER — AMLODIPINE BESYLATE 5 MG PO TABS
5.0000 mg | ORAL_TABLET | Freq: Every day | ORAL | 1 refills | Status: DC
  Filled 2021-07-06: qty 30, 30d supply, fill #0

## 2021-07-06 MED ORDER — CARVEDILOL 6.25 MG PO TABS
6.2500 mg | ORAL_TABLET | Freq: Two times a day (BID) | ORAL | 1 refills | Status: DC
  Filled 2021-07-06: qty 60, 30d supply, fill #0

## 2021-07-06 MED ORDER — OZEMPIC (0.25 OR 0.5 MG/DOSE) 2 MG/3ML ~~LOC~~ SOPN
0.5000 mg | PEN_INJECTOR | SUBCUTANEOUS | 1 refills | Status: DC
  Filled 2021-07-06: qty 3, 28d supply, fill #0
  Filled 2021-08-10 – 2021-08-17 (×2): qty 3, 28d supply, fill #1

## 2021-07-06 NOTE — Progress Notes (Signed)
Subjective:  Patient ID: Daniel [name], male    DOB: 1970/02/22  Age: 51 y.o. MRN: [id number]  CC: Hypertension   HPI Daniel [name] is a 51 y.o. year old male with a history of Previous alcohol abuse, hypertension, pulmonary embolism (in 02/2018; completed course of anticoagulation) prediabetes (A1c 6.4), bilateral hip osteoarthritis here for an office visit.  Interval History: He is currently on Ozempic for prediabetes but has not noticed any much weight loss.  He needs to lose a significant amount of weight to undergo bilateral hip surgery per orthopedics.  Still smokes Cigarettes 1pack/ week and is not ready to quit.  I had referred him to dermatology and infectious disease due to a persistent gluteal rash.  Appointment with dermatology does not come up till next month however he was seen by infectious disease and has had 3 visits so far.  Treated with terbinafine and he also received clobetasol for different rash that developed on his leg The rash in his gluteal region has cleared up and so has the rash in his L popliteal region.  Denies additional concerns today. Past Medical History:  Diagnosis Date   Asthma    Asthma    Hypertension     Past Surgical History:  Procedure Laterality Date   Dental procedure      Family History  Problem Relation Age of Onset   Heart attack Mother     Social History   Socioeconomic History   Marital status: Divorced    Spouse name: Not on file   Number of children: Not on file   Years of education: Not on file   Highest education level: Not on file  Occupational History   Not on file  Tobacco Use   Smoking status: Some Days    Packs/day: 0.30    Types: Cigarettes   Smokeless tobacco: Never  Vaping Use   Vaping Use: Never used  Substance and Sexual Activity   Alcohol use: Not Currently   Drug use: Not Currently   Sexual activity: Yes    Birth control/protection: None    Comment: Married  Other Topics Concern   Not on  file  Social History Narrative   ** Merged History Encounter **       Social Determinants of Health   Financial Resource Strain: Not on file  Food Insecurity: Not on file  Transportation Needs: Not on file  Physical Activity: Not on file  Stress: Not on file  Social Connections: Not on file    No Known Allergies  Outpatient Medications Prior to Visit  Medication Sig Dispense Refill   Acetaminophen (TYLENOL PO) Tylenol     albuterol (VENTOLIN HFA) 108 (90 Base) MCG/ACT inhaler Inhale 1 puff into the lungs every 6 (six) hours as needed for wheezing or shortness of breath. 8.5 g 0   allopurinol (ZYLOPRIM) 300 MG tablet TAKE 1 TABLET (300 MG TOTAL) BY MOUTH 2 (TWO) TIMES DAILY. 180 tablet 0   celecoxib (CELEBREX) 200 MG capsule Celebrex 200 mg capsule  Take 1 capsule every day by oral route.     clobetasol cream (TEMOVATE) 0.05 % Apply topically 2 (two) times daily. (Patient not taking: Reported on 06/14/2021) 30 g 0   hydrochlorothiazide (HYDRODIURIL) 25 MG tablet Take 1 tablet (25 mg total) by mouth daily. 90 tablet 1   Oxycodone HCl 10 MG TABS oxycodone 10 mg tablet  Take 1 tablet 3 times a day by oral route as needed.  amLODipine (NORVASC) 5 MG tablet TAKE 1 TABLET (5 MG TOTAL) BY MOUTH DAILY. 90 tablet 0   carvedilol (COREG) 6.25 MG tablet TAKE 1 TABLET BY MOUTH 2 TIMES DAILY WITH A MEAL. 180 tablet 1   cephALEXin (KEFLEX) 500 MG capsule Take 1 capsule (500 mg total) by mouth 2 (two) times daily. (Patient not taking: Reported on 03/16/2021) 14 capsule 0   doxycycline (VIBRA-TABS) 100 MG tablet Take 1 tablet (100 mg total) by mouth 2 (two) times daily. (Patient not taking: Reported on 03/30/2021) 20 tablet 0   predniSONE (DELTASONE) 20 MG tablet Take 1 tablet (20 mg total) by mouth daily with breakfast. (Patient not taking: Reported on 03/30/2021) 5 tablet 0   Semaglutide,0.25 or 0.5MG /DOS, (OZEMPIC, 0.25 OR 0.5 MG/DOSE,) 2 MG/3ML SOPN Inject 0.25 mg into the skin once a week. 3 mL 6    No facility-administered medications prior to visit.     ROS Review of Systems  Constitutional:  Negative for activity change and appetite change.  HENT:  Negative for sinus pressure and sore throat.   Respiratory:  Negative for chest tightness, shortness of breath and wheezing.   Cardiovascular:  Negative for chest pain and palpitations.  Gastrointestinal:  Negative for abdominal distention, abdominal pain and constipation.  Genitourinary: Negative.   Musculoskeletal:  Positive for arthralgias.  Psychiatric/Behavioral:  Negative for behavioral problems and dysphoric mood.     Objective:  BP 129/85   Pulse 95   Temp 98.2 F (36.8 C) (Oral)   Ht 5\' 9"  (1.753 m)   Wt 280 lb 9.6 oz (127.3 kg)   SpO2 97%   BMI 41.44 kg/m      07/06/2021   11:15 AM 06/14/2021    9:23 AM 04/26/2021   10:31 AM  BP/Weight  Systolic BP 129 133 141  Diastolic BP 85 90 94  Wt. (Lbs) 280.6 281.8 282.2  BMI 41.44 kg/m2 41.61 kg/m2 41.67 kg/m2      Physical Exam Constitutional:      Appearance: He is well-developed.  Cardiovascular:     Rate and Rhythm: Normal rate.     Heart sounds: Normal heart sounds. No murmur heard. Pulmonary:     Effort: Pulmonary effort is normal.     Breath sounds: Normal breath sounds. No wheezing or rales.  Chest:     Chest wall: No tenderness.  Abdominal:     General: Bowel sounds are normal. There is no distension.     Palpations: Abdomen is soft. There is no mass.     Tenderness: There is no abdominal tenderness.  Musculoskeletal:     Right lower leg: No edema.     Left lower leg: No edema.     Comments: Severely restricted range of motion in bilateral hips  Neurological:     Mental Status: He is alert and oriented to person, place, and time.  Psychiatric:        Mood and Affect: Mood normal.        Latest Ref Rng & Units 04/26/2021   11:17 AM 02/14/2021   12:15 PM 08/26/2020   10:12 AM  CMP  Glucose 65 - 99 mg/dL 161103  096120  045110   BUN 7 - 25 mg/dL 11   11  10    Creatinine 0.60 - 1.29 mg/dL 4.090.84  8.110.94  9.140.87   Sodium 135 - 146 mmol/L 139  138  133   Potassium 3.5 - 5.3 mmol/L 3.7  3.7  3.9   Chloride 98 -  110 mmol/L 101  97  93   CO2 20 - 32 mmol/L 30  26  24    Calcium 8.6 - 10.3 mg/dL 9.5  9.3    Total Protein 6.1 - 8.1 g/dL 7.1     Total Bilirubin 0.2 - 1.2 mg/dL 0.6     AST 10 - 40 U/L 14     ALT 9 - 46 U/L 8       Lipid Panel     Component Value Date/Time   CHOL 156 08/11/2020 1056   TRIG 88 08/11/2020 1056   HDL 41 08/11/2020 1056   CHOLHDL 3.8 08/11/2020 1056   LDLCALC 98 08/11/2020 1056    CBC    Component Value Date/Time   WBC 9.3 04/26/2021 1117   RBC 4.66 04/26/2021 1117   HGB 14.5 04/26/2021 1117   HGB 15.0 05/28/2019 1622   HCT 43.1 04/26/2021 1117   HCT 43.2 05/28/2019 1622   PLT 315 04/26/2021 1117   PLT 306 05/28/2019 1622   MCV 92.5 04/26/2021 1117   MCV 96 05/28/2019 1622   MCH 31.1 04/26/2021 1117   MCHC 33.6 04/26/2021 1117   RDW 13.7 04/26/2021 1117   RDW 15.5 (H) 05/28/2019 1622   LYMPHSABS 1.8 05/28/2019 1622   MONOABS 0.9 11/24/2018 1242   EOSABS 0.1 05/28/2019 1622   BASOSABS 0.1 05/28/2019 1622    Lab Results  Component Value Date   HGBA1C 6.4 07/06/2021    Assessment & Plan:  1. Prediabetes A1c of 6.4 Increased dose of Ozempic from 0.25 to 0.5 mg for additional weight loss benefit Continue to work on lifestyle modification to prevent progression to type 2 diabetes mellitus - POCT glucose (manual entry) - POCT glycosylated hemoglobin (Hb A1C) - Semaglutide,0.25 or 0.5MG /DOS, (OZEMPIC, 0.25 OR 0.5 MG/DOSE,) 2 MG/1.5ML SOPN; Inject 0.5 mg into the skin once a week.  Dispense: 2 mL; Refill: 6  2. Essential hypertension Controlled Counseled on blood pressure goal of less than 130/80, low-sodium, DASH diet, medication compliance, 150 minutes of moderate intensity exercise per week. Discussed medication compliance, adverse effects. - Basic Metabolic Panel - LP+Non-HDL  Cholesterol - amLODipine (NORVASC) 5 MG tablet; Take 1 tablet (5 mg total) by mouth once daily.  Dispense: 90 tablet; Refill: 1 - carvedilol (COREG) 6.25 MG tablet; Take 1 tablet (6.25 mg total) by mouth 2 (two) times daily with a meal.  Dispense: 180 tablet; Refill: 1  3. Bilateral primary osteoarthritis of hip Uncontrolled Needs bilateral hip surgery Continue Celebrex He is working on weight loss so he can proceed with elective procedure  4. Morbid obesity (HCC) Unfortunately exercising is difficult due to his bilateral hip pain Hopefully increasing dose of GLP-1 RA will be beneficial Continue to work on caloric restriction   Health care maintenance - previously referred for colonoscopy and I have provided him with the number to call and schedule as the GI office was unsuccessful in reaching him. Meds ordered this encounter  Medications   Semaglutide,0.25 or 0.5MG /DOS, (OZEMPIC, 0.25 OR 0.5 MG/DOSE,) 2 MG/1.5ML SOPN    Sig: Inject 0.5 mg into the skin once a week.    Dispense:  2 mL    Refill:  6    Dose increase   amLODipine (NORVASC) 5 MG tablet    Sig: Take 1 tablet (5 mg total) by mouth once daily.    Dispense:  90 tablet    Refill:  1   carvedilol (COREG) 6.25 MG tablet    Sig: Take  1 tablet (6.25 mg total) by mouth 2 (two) times daily with a meal.    Dispense:  180 tablet    Refill:  1    Follow-up: Return in about 6 months (around 01/05/2022) for Chronic medical conditions.       Hoy Register, MD, FAAFP. [facility] [place], [place] [phone]   07/06/2021, 1:26 PM

## 2021-07-06 NOTE — Patient Instructions (Signed)
Please call for your Colonoscopy: [facility] [address] [place], [place] [address]PH# [phone]

## 2021-07-07 ENCOUNTER — Other Ambulatory Visit: Payer: Self-pay

## 2021-07-07 LAB — BASIC METABOLIC PANEL
BUN/Creatinine Ratio: 14 (ref 9–20)
BUN: 10 mg/dL (ref 6–24)
CO2: 25 mmol/L (ref 20–29)
Calcium: 9.3 mg/dL (ref 8.7–10.2)
Chloride: 97 mmol/L (ref 96–106)
Creatinine, Ser: 0.73 mg/dL — ABNORMAL LOW (ref 0.76–1.27)
Glucose: 104 mg/dL — ABNORMAL HIGH (ref 70–99)
Potassium: 3.7 mmol/L (ref 3.5–5.2)
Sodium: 140 mmol/L (ref 134–144)
eGFR: 112 mL/min/{1.73_m2} (ref >59)

## 2021-07-07 LAB — LP+NON-HDL CHOLESTEROL
Cholesterol, Total: 162 mg/dL (ref 100–199)
HDL: 39 mg/dL — ABNORMAL LOW (ref >39)
LDL Chol Calc (NIH): 106 mg/dL — ABNORMAL HIGH (ref 0–99)
Total Non-HDL-Chol (LDL+VLDL): 123 mg/dL (ref 0–129)
Triglycerides: 88 mg/dL (ref 0–149)
VLDL Cholesterol Cal: 17 mg/dL (ref 5–40)

## 2021-07-08 ENCOUNTER — Other Ambulatory Visit: Payer: Self-pay

## 2021-07-11 ENCOUNTER — Other Ambulatory Visit: Payer: Self-pay

## 2021-07-12 ENCOUNTER — Other Ambulatory Visit: Payer: Self-pay

## 2021-07-13 ENCOUNTER — Other Ambulatory Visit: Payer: Self-pay

## 2021-08-10 ENCOUNTER — Other Ambulatory Visit: Payer: Self-pay

## 2021-08-15 ENCOUNTER — Other Ambulatory Visit: Payer: Self-pay | Admitting: Family Medicine

## 2021-08-15 DIAGNOSIS — I10 Essential (primary) hypertension: Secondary | ICD-10-CM

## 2021-08-15 NOTE — Telephone Encounter (Signed)
Medication Refill - Medication:  hydrochlorothiazide (HYDRODIURIL) 25 MG tablet amLODipine (NORVASC) 5 MG tablet  Has the patient contacted their pharmacy? Yes.     Preferred Pharmacy (with phone number or street name):  [id number] - [place], [place] - [address]  Phone: [phone] Fax: [phone]  Has the patient been seen for an appointment in the last year OR does the patient have an upcoming appointment? Yes.    Pt states they are currently out of both medications.

## 2021-08-15 NOTE — Telephone Encounter (Signed)
Hydrochlorothiazide already ordered today.

## 2021-08-16 ENCOUNTER — Other Ambulatory Visit: Payer: Self-pay

## 2021-08-17 ENCOUNTER — Other Ambulatory Visit: Payer: Self-pay

## 2021-08-17 MED ORDER — AMLODIPINE BESYLATE 5 MG PO TABS: 5.0000 mg | ORAL_TABLET | Freq: Every day | ORAL | 0 refills | Status: DC

## 2021-08-17 NOTE — Telephone Encounter (Signed)
.change of pharmacy Requested Prescriptions  Pending Prescriptions Disp Refills  . amLODipine (NORVASC) 5 MG tablet 90 tablet 0    Sig: Take 1 tablet (5 mg total) by mouth once daily.     Cardiovascular: Calcium Channel Blockers 2 Passed - 08/15/2021  5:07 PM      Passed - Last BP in normal range    BP Readings from Last 1 Encounters:  07/06/21 129/85         Passed - Last Heart Rate in normal range    Pulse Readings from Last 1 Encounters:  07/06/21 95         Passed - Valid encounter within last 6 months    Recent Outpatient Visits          1 month ago Prediabetes   [facility] [place], Odette Horns, MD   5 months ago Impetigo   [facility] Hoy Register, MD   6 months ago Prediabetes   [facility] Hoy Register, MD   9 months ago Essential hypertension   [place] Hoy Register, MD   9 months ago Essential hypertension   [facility] Lois Huxley, Cornelius Moras, RPH-CPP      Future Appointments            In 4 months Hoy Register, MD [facility]           Signed Prescriptions Disp Refills   hydrochlorothiazide (HYDRODIURIL) 25 MG tablet 90 tablet 1    Sig: TAKE 1 TABLET (25 MG TOTAL) BY MOUTH DAILY.     Cardiovascular: Diuretics - Thiazide Failed - 08/15/2021  4:22 PM      Failed - Cr in normal range and within 180 days    Creat  Date Value Ref Range Status  04/26/2021 0.84 0.60 - 1.29 mg/dL Final   Creatinine, Ser  Date Value Ref Range Status  07/06/2021 0.73 (L) 0.76 - 1.27 mg/dL Final         Passed - K in normal range and within 180 days    Potassium  Date Value Ref Range Status  07/06/2021 3.7 3.5 - 5.2 mmol/L Final         Passed - Na in normal range and within 180 days    Sodium  Date Value Ref Range Status  07/06/2021 140 134 - 144 mmol/L Final         Passed -  Last BP in normal range    BP Readings from Last 1 Encounters:  07/06/21 129/85         Passed - Valid encounter within last 6 months    Recent Outpatient Visits          1 month ago Prediabetes   [facility] [place], Odette Horns, MD   5 months ago Impetigo   [facility] Hoy Register, MD   6 months ago Prediabetes   [facility] Hoy Register, MD   9 months ago Essential hypertension   [facility] Hoy Register, MD   9 months ago Essential hypertension   [facility] Lois Huxley, Cornelius Moras, RPH-CPP      Future Appointments            In 4 months Hoy Register, MD [facility]

## 2021-09-03 ENCOUNTER — Other Ambulatory Visit: Payer: Self-pay | Admitting: Family Medicine

## 2021-09-03 DIAGNOSIS — M1A00X0 Idiopathic chronic gout, unspecified site, without tophus (tophi): Secondary | ICD-10-CM

## 2021-09-05 NOTE — Telephone Encounter (Signed)
Requested Prescriptions  Pending Prescriptions Disp Refills  . allopurinol (ZYLOPRIM) 300 MG tablet [Pharmacy Med Name: ALLOPURINOL 300 MG TABLET] 180 tablet 1    Sig: TAKE 1 TABLET BY MOUTH 2 TIMES DAILY.     Endocrinology:  Gout Agents - allopurinol Failed - 09/03/2021 12:15 AM      Failed - Uric Acid in normal range and within 360 days    Uric Acid  Date Value Ref Range Status  05/28/2019 10.8 (H) 3.8 - 8.4 mg/dL Final    Comment:               Therapeutic target for gout patients: <6.0         Failed - Cr in normal range and within 360 days    Creat  Date Value Ref Range Status  04/26/2021 0.84 0.60 - 1.29 mg/dL Final   Creatinine, Ser  Date Value Ref Range Status  07/06/2021 0.73 (L) 0.76 - 1.27 mg/dL Final         Failed - CBC within normal limits and completed in the last 12 months    WBC  Date Value Ref Range Status  04/26/2021 9.3 3.8 - 10.8 Thousand/uL Final   RBC  Date Value Ref Range Status  04/26/2021 4.66 4.20 - 5.80 Million/uL Final   Hemoglobin  Date Value Ref Range Status  04/26/2021 14.5 13.2 - 17.1 g/dL Final  47/82/9562 13.0 13.0 - 17.7 g/dL Final   HCT  Date Value Ref Range Status  04/26/2021 43.1 38.5 - 50.0 % Final   Hematocrit  Date Value Ref Range Status  05/28/2019 43.2 37.5 - 51.0 % Final   MCHC  Date Value Ref Range Status  04/26/2021 33.6 32.0 - 36.0 g/dL Final   [facility]  Date Value Ref Range Status  04/26/2021 31.1 27.0 - 33.0 pg Final   MCV  Date Value Ref Range Status  04/26/2021 92.5 80.0 - 100.0 fL Final  05/28/2019 96 79 - 97 fL Final   No results found for: "PLTCOUNTKUC", "LABPLAT", "POCPLA" RDW  Date Value Ref Range Status  04/26/2021 13.7 11.0 - 15.0 % Final  05/28/2019 15.5 (H) 11.6 - 15.4 % Final         Passed - Valid encounter within last 12 months    Recent Outpatient Visits          2 months ago Prediabetes   [facility] [place], Odette Horns, MD   5 months ago Impetigo   [facility] Hoy Register, MD   6 months ago Prediabetes   [facility] Hoy Register, MD   10 months ago Essential hypertension    [facility] Hoy Register, MD   10 months ago Essential hypertension   [facility] Drucilla Chalet, RPH-CPP      Future Appointments            In 4 months Hoy Register, MD [facility] [place] [facility]

## 2021-09-06 DIAGNOSIS — Z79899 Other long term (current) drug therapy: Secondary | ICD-10-CM | POA: Diagnosis not present

## 2021-09-06 DIAGNOSIS — G894 Chronic pain syndrome: Secondary | ICD-10-CM | POA: Diagnosis not present

## 2021-09-06 DIAGNOSIS — Z5181 Encounter for therapeutic drug level monitoring: Secondary | ICD-10-CM | POA: Diagnosis not present

## 2021-09-13 ENCOUNTER — Other Ambulatory Visit: Payer: Self-pay

## 2021-09-13 ENCOUNTER — Other Ambulatory Visit: Payer: Self-pay | Admitting: Family Medicine

## 2021-09-14 ENCOUNTER — Other Ambulatory Visit: Payer: Self-pay

## 2021-09-14 MED ORDER — OZEMPIC (0.25 OR 0.5 MG/DOSE) 2 MG/3ML ~~LOC~~ SOPN
0.5000 mg | PEN_INJECTOR | SUBCUTANEOUS | 1 refills | Status: DC
  Filled 2021-09-14: qty 3, 28d supply, fill #0
  Filled 2021-10-10: qty 3, 28d supply, fill #1

## 2021-09-20 DIAGNOSIS — M160 Bilateral primary osteoarthritis of hip: Secondary | ICD-10-CM | POA: Diagnosis not present

## 2021-09-22 ENCOUNTER — Telehealth: Payer: Self-pay | Admitting: Family Medicine

## 2021-09-22 NOTE — Telephone Encounter (Signed)
Copied from CRM [id number]. Topic: General - Other >> Sep 22, 2021 12:17 PM Ja-Kwan M wrote: Reason for CRM: Pt called for an update on the surgical clearance letter that was dropped off because the office that will be doing the surgery called today and advised that they have not received the clearance letter. Cb# [phone]

## 2021-09-22 NOTE — Telephone Encounter (Signed)
Does patient need a office visit. °

## 2021-09-23 NOTE — Telephone Encounter (Signed)
Yes he does.  We do not have a recent EKG on file.  Okay to place on  walk -in schedule.

## 2021-09-28 NOTE — Telephone Encounter (Signed)
Pt has been called and placed on walkin schedule for 10/03/2021

## 2021-10-03 ENCOUNTER — Encounter: Payer: Self-pay | Admitting: Family Medicine

## 2021-10-03 ENCOUNTER — Ambulatory Visit: Payer: BC Managed Care – PPO | Attending: Family Medicine | Admitting: Family Medicine

## 2021-10-03 ENCOUNTER — Other Ambulatory Visit: Payer: Self-pay

## 2021-10-03 VITALS — BP 121/85 | HR 94 | Temp 98.5°F | Ht 68.0 in

## 2021-10-03 DIAGNOSIS — Z791 Long term (current) use of non-steroidal anti-inflammatories (NSAID): Secondary | ICD-10-CM | POA: Diagnosis not present

## 2021-10-03 DIAGNOSIS — I10 Essential (primary) hypertension: Secondary | ICD-10-CM | POA: Insufficient documentation

## 2021-10-03 DIAGNOSIS — Z716 Tobacco abuse counseling: Secondary | ICD-10-CM | POA: Insufficient documentation

## 2021-10-03 DIAGNOSIS — Z720 Tobacco use: Secondary | ICD-10-CM

## 2021-10-03 DIAGNOSIS — Z86711 Personal history of pulmonary embolism: Secondary | ICD-10-CM | POA: Diagnosis not present

## 2021-10-03 DIAGNOSIS — J45909 Unspecified asthma, uncomplicated: Secondary | ICD-10-CM | POA: Diagnosis not present

## 2021-10-03 DIAGNOSIS — F17210 Nicotine dependence, cigarettes, uncomplicated: Secondary | ICD-10-CM | POA: Insufficient documentation

## 2021-10-03 DIAGNOSIS — Z79899 Other long term (current) drug therapy: Secondary | ICD-10-CM | POA: Diagnosis not present

## 2021-10-03 DIAGNOSIS — Z01818 Encounter for other preprocedural examination: Secondary | ICD-10-CM | POA: Diagnosis not present

## 2021-10-03 DIAGNOSIS — R7303 Prediabetes: Secondary | ICD-10-CM | POA: Diagnosis not present

## 2021-10-03 DIAGNOSIS — M160 Bilateral primary osteoarthritis of hip: Secondary | ICD-10-CM

## 2021-10-03 MED ORDER — BUPROPION HCL ER (XL) 150 MG PO TB24
150.0000 mg | ORAL_TABLET | Freq: Every day | ORAL | 1 refills | Status: DC
  Filled 2021-10-03: qty 30, 30d supply, fill #0

## 2021-10-03 NOTE — Patient Instructions (Signed)

## 2021-10-03 NOTE — Progress Notes (Signed)
Subjective:  Patient ID: Daniel [name], male    DOB: [birth year]  Age: 51 y.o. MRN: [id number]  CC: Pre-op Exam   HPI Daniel [name] is a 51 y.o. year old male with a history of Previous alcohol abuse, hypertension, pulmonary embolism (in 02/2018; completed course of anticoagulation) prediabetes (A1c 6.4), bilateral hip osteoarthritis.  Interval History:  He has no chest pain or dyspnea and has a good exercise tolerance which is only limited by pain in his hips.  He can otherwise climb a flight of stairs if he did not have pain. Pain is worse in both hips and he will be undergoing left THA followed by right THA by [facility]. Denies additional concerns today. He had a visit for chronic disease management 3 months ago and was scheduled to return in 6 months. He smokes 1 pack of Cigarettes over 3 days Past Medical History:  Diagnosis Date   Asthma    Asthma    Hypertension     Past Surgical History:  Procedure Laterality Date   Dental procedure      Family History  Problem Relation Age of Onset   Heart attack Mother     Social History   Socioeconomic History   Marital status: Divorced    Spouse name: Not on file   Number of children: Not on file   Years of education: Not on file   Highest education level: Not on file  Occupational History   Not on file  Tobacco Use   Smoking status: Some Days    Packs/day: 0.30    Types: Cigarettes   Smokeless tobacco: Never  Vaping Use   Vaping Use: Never used  Substance and Sexual Activity   Alcohol use: Not Currently   Drug use: Not Currently   Sexual activity: Yes    Birth control/protection: None    Comment: Married  Other Topics Concern   Not on file  Social History Narrative   ** Merged History Encounter **       Social Determinants of Health   Financial Resource Strain: Not on file  Food Insecurity: Not on file  Transportation Needs: Not on file  Physical Activity: Not on file  Stress: Not on file  Social  Connections: Not on file    No Known Allergies  Outpatient Medications Prior to Visit  Medication Sig Dispense Refill   Acetaminophen (TYLENOL PO) Tylenol     albuterol (VENTOLIN HFA) 108 (90 Base) MCG/ACT inhaler Inhale 1 puff into the lungs every 6 (six) hours as needed for wheezing or shortness of breath. 8.5 g 0   allopurinol (ZYLOPRIM) 300 MG tablet TAKE 1 TABLET BY MOUTH 2 TIMES DAILY. 180 tablet 1   amLODipine (NORVASC) 5 MG tablet Take 1 tablet (5 mg total) by mouth once daily. 90 tablet 0   carvedilol (COREG) 6.25 MG tablet Take 1 tablet (6.25 mg total) by mouth 2 (two) times daily with a meal. 180 tablet 1   celecoxib (CELEBREX) 200 MG capsule Celebrex 200 mg capsule  Take 1 capsule every day by oral route.     clobetasol cream (TEMOVATE) 0.05 % Apply topically 2 (two) times daily. 30 g 0   hydrochlorothiazide (HYDRODIURIL) 25 MG tablet TAKE 1 TABLET (25 MG TOTAL) BY MOUTH DAILY. 90 tablet 1   Oxycodone HCl 10 MG TABS oxycodone 10 mg tablet  Take 1 tablet 3 times a day by oral route as needed.     Semaglutide,0.25 or 0.5MG /DOS, (OZEMPIC, 0.25 OR  0.5 MG/DOSE,) 2 MG/3ML SOPN Inject 0.5 mg into the skin once a week. 3 mL 1   No facility-administered medications prior to visit.     ROS Review of Systems  Constitutional:  Negative for activity change and appetite change.  HENT:  Negative for sinus pressure and sore throat.   Respiratory:  Negative for chest tightness, shortness of breath and wheezing.   Cardiovascular:  Negative for chest pain and palpitations.  Gastrointestinal:  Negative for abdominal distention, abdominal pain and constipation.  Genitourinary: Negative.   Musculoskeletal:        See HPI  Psychiatric/Behavioral:  Negative for behavioral problems and dysphoric mood.     Objective:  BP 121/85   Pulse 94   Temp 98.5 F (36.9 C) (Oral)   Ht 5\' 8"  (1.727 m)   SpO2 96%   BMI 42.67 kg/m      10/03/2021   10:01 AM 07/06/2021   11:15 AM 06/14/2021     9:23 AM  BP/Weight  Systolic BP 121 129 133  Diastolic BP 85 85 90  Wt. (Lbs)  280.6 281.8  BMI  41.44 kg/m2 41.61 kg/m2      Physical Exam Constitutional:      Appearance: He is well-developed.  Cardiovascular:     Rate and Rhythm: Normal rate.     Heart sounds: Normal heart sounds. No murmur heard. Pulmonary:     Effort: Pulmonary effort is normal.     Breath sounds: Normal breath sounds. No wheezing or rales.  Chest:     Chest wall: No tenderness.  Abdominal:     General: Bowel sounds are normal. There is no distension.     Palpations: Abdomen is soft. There is no mass.     Tenderness: There is no abdominal tenderness.  Musculoskeletal:     Right lower leg: No edema.     Left lower leg: No edema.     Comments: Severely restricted range of motion in bilateral hips with associated tenderness  Neurological:     Mental Status: He is alert and oriented to person, place, and time.     Gait: Gait abnormal.  Psychiatric:        Mood and Affect: Mood normal.        Latest Ref Rng & Units 07/06/2021   11:50 AM 04/26/2021   11:17 AM 02/14/2021   12:15 PM  CMP  Glucose 70 - 99 mg/dL 02/16/2021  629  528   BUN 6 - 24 mg/dL 10  11  11    Creatinine 0.76 - 1.27 mg/dL 413   2.44   Sodium 134 - 144 mmol/L 140  139  138   Potassium 3.5 - 5.2 mmol/L 3.7  3.7  3.7   Chloride 96 - 106 mmol/L 97  101  97   CO2 20 - 29 mmol/L 25  30  26    Calcium 8.7 - 10.2 mg/dL 9.3  9.5  9.3   Total Protein 6.1 - 8.1 g/dL  7.1    Total Bilirubin 0.2 - 1.2 mg/dL  0.6    AST 10 - 40 U/L  14    ALT 9 - 46 U/L  8      Lipid Panel     Component Value Date/Time   CHOL 162 07/06/2021 1150   TRIG 88 07/06/2021 1150   HDL 39 (L) 07/06/2021 1150   CHOLHDL 3.8 08/11/2020 1056   LDLCALC 106 (H) 07/06/2021 1150    CBC  Component Value Date/Time   WBC 9.3 04/26/2021 1117   RBC 4.66 04/26/2021 1117   HGB 14.5 04/26/2021 1117   HGB 15.0 05/28/2019 1622   HCT 43.1 04/26/2021 1117   HCT 43.2  05/28/2019 1622   PLT 315 04/26/2021 1117   PLT 306 05/28/2019 1622   MCV 92.5 04/26/2021 1117   MCV 96 05/28/2019 1622   MCH 31.1 04/26/2021 1117   MCHC 33.6 04/26/2021 1117   RDW 13.7 04/26/2021 1117   RDW 15.5 (H) 05/28/2019 1622   LYMPHSABS 1.8 05/28/2019 1622   MONOABS 0.9 11/24/2018 1242   EOSABS 0.1 05/28/2019 1622   BASOSABS 0.1 05/28/2019 1622    Lab Results  Component Value Date   HGBA1C 6.4 07/06/2021    Assessment & Plan:  1. Pre-op evaluation EKG revealed normal sinus rhythm Able to perform 4 METS of activity He is medically optimized for moderate risk procedure with a low cardiac risk We will complete preoperative form and faxed over.  2. Tobacco abuse Spent 3 minutes counseling on cessation especially in the light of his anticipated elective surgery He is willing to work on quitting and has been initiated on pharmacotherapy - buPROPion (WELLBUTRIN XL) 150 MG 24 hr tablet; Take 1 tablet (150 mg total) by mouth daily. For smoking cessation  Dispense: 90 tablet; Refill: 1  3. Bilateral primary osteoarthritis of hip Uncontrolled Being worked up for elective THA of left hip which will be subsequently followed by right hip   Meds ordered this encounter  Medications   buPROPion (WELLBUTRIN XL) 150 MG 24 hr tablet    Sig: Take 1 tablet (150 mg total) by mouth daily. For smoking cessation    Dispense:  90 tablet    Refill:  1    Follow-up: Return for follow up, keep previously scheduled appointment.       Charlott Rakes, MD, FAAFP. [facility] and [place], [place]   10/03/2021, 10:19 AM

## 2021-10-10 ENCOUNTER — Other Ambulatory Visit: Payer: Self-pay

## 2021-10-11 ENCOUNTER — Other Ambulatory Visit: Payer: Self-pay

## 2021-10-11 NOTE — Telephone Encounter (Signed)
Patient requesting a follow up call, checking on the status if surgical clearance forms were faxed.

## 2021-10-12 NOTE — Telephone Encounter (Signed)
Pt was called and informed that paperwork has been faxed.

## 2021-11-01 ENCOUNTER — Ambulatory Visit: Payer: Self-pay | Admitting: Student

## 2021-11-01 DIAGNOSIS — E119 Type 2 diabetes mellitus without complications: Secondary | ICD-10-CM

## 2021-11-07 ENCOUNTER — Other Ambulatory Visit: Payer: Self-pay

## 2021-11-07 ENCOUNTER — Other Ambulatory Visit: Payer: Self-pay | Admitting: Family Medicine

## 2021-11-07 MED ORDER — OZEMPIC (0.25 OR 0.5 MG/DOSE) 2 MG/3ML ~~LOC~~ SOPN
0.5000 mg | PEN_INJECTOR | SUBCUTANEOUS | 1 refills | Status: DC
  Filled 2021-11-07: qty 3, 28d supply, fill #0
  Filled 2021-12-07: qty 3, 28d supply, fill #1

## 2021-11-08 ENCOUNTER — Other Ambulatory Visit: Payer: Self-pay | Admitting: Family Medicine

## 2021-11-08 DIAGNOSIS — I10 Essential (primary) hypertension: Secondary | ICD-10-CM

## 2021-11-15 ENCOUNTER — Ambulatory Visit: Payer: Self-pay | Admitting: Student

## 2021-11-15 DIAGNOSIS — E119 Type 2 diabetes mellitus without complications: Secondary | ICD-10-CM

## 2021-11-15 NOTE — H&P (Signed)
TOTAL HIP ADMISSION H&P  Patient is admitted for left total hip arthroplasty.  Subjective:  Chief Complaint: left hip pain  HPI: Daniel [name], 51 y.o. male, has a history of pain and functional disability in the left hip(s) due to arthritis and patient has failed non-surgical conservative treatments for greater than 12 weeks to include NSAID's and/or analgesics, flexibility and strengthening excercises, use of assistive devices, weight reduction as appropriate, and activity modification.  Onset of symptoms was gradual starting 10 years ago with rapidlly worsening course since that time.The patient noted no past surgery on the left hip(s).  Patient currently rates pain in the left hip at 10 out of 10 with activity. Patient has night pain, worsening of pain with activity and weight bearing, trendelenberg gait, pain that interfers with activities of daily living, and pain with passive range of motion. Patient has evidence of subchondral cysts, subchondral sclerosis, periarticular osteophytes, and joint space narrowing by imaging studies. This condition presents safety issues increasing the risk of falls. There is no current active infection.  Patient Active Problem List   Diagnosis Date Noted   Morbid obesity ([place]) 07/06/2021   Hypomagnesemia 03/03/2018   HTN (hypertension) 123456   Alcoholic hepatitis without ascites 03/02/2018   Chronic anemia 03/02/2018   Alcohol abuse 03/01/2018   Tobacco abuse 03/01/2018   Asthma 03/01/2018   Exacerbation of asthma    Past Medical History:  Diagnosis Date   Asthma    Asthma    Hypertension     Past Surgical History:  Procedure Laterality Date   Dental procedure      Current Outpatient Medications  Medication Sig Dispense Refill Last Dose   Acetaminophen (TYLENOL PO) Tylenol      albuterol (VENTOLIN HFA) 108 (90 Base) MCG/ACT inhaler Inhale 1 puff into the lungs every 6 (six) hours as needed for wheezing or shortness of breath. 8.5 g 0     allopurinol (ZYLOPRIM) 300 MG tablet TAKE 1 TABLET BY MOUTH 2 TIMES DAILY. 180 tablet 1    amLODipine (NORVASC) 5 MG tablet TAKE 1 TABLET BY MOUTH EVERY DAY 90 tablet 0    buPROPion (WELLBUTRIN XL) 150 MG 24 hr tablet Take 1 tablet (150 mg total) by mouth daily. For smoking cessation 90 tablet 1    carvedilol (COREG) 6.25 MG tablet Take 1 tablet (6.25 mg total) by mouth 2 (two) times daily with a meal. 180 tablet 1    celecoxib (CELEBREX) 200 MG capsule Celebrex 200 mg capsule  Take 1 capsule every day by oral route.      clobetasol cream (TEMOVATE) 0.05 % Apply topically 2 (two) times daily. 30 g 0    hydrochlorothiazide (HYDRODIURIL) 25 MG tablet TAKE 1 TABLET (25 MG TOTAL) BY MOUTH DAILY. 90 tablet 1    Oxycodone HCl 10 MG TABS oxycodone 10 mg tablet  Take 1 tablet 3 times a day by oral route as needed.      Semaglutide,0.25 or 0.5MG /DOS, (OZEMPIC, 0.25 OR 0.5 MG/DOSE,) 2 MG/3ML SOPN Inject 0.5 mg into the skin once a week. 3 mL 1    No current facility-administered medications for this visit.   No Known Allergies  Social History   Tobacco Use   Smoking status: Some Days    Packs/day: 0.30    Types: Cigarettes   Smokeless tobacco: Never  Substance Use Topics   Alcohol use: Not Currently    Family History  Problem Relation Age of Onset   Heart attack Mother  Review of Systems  Musculoskeletal:  Positive for arthralgias and gait problem.  All other systems reviewed and are negative.   Objective:  Physical Exam Constitutional:      Appearance: Normal appearance.  HENT:     Head: Normocephalic and atraumatic.     Nose: Nose normal.     Mouth/Throat:     Mouth: Mucous membranes are moist.     Pharynx: Oropharynx is clear.  Eyes:     Conjunctiva/sclera: Conjunctivae normal.  Cardiovascular:     Rate and Rhythm: Normal rate and regular rhythm.     Pulses: Normal pulses.     Heart sounds: Normal heart sounds.  Pulmonary:     Effort: Pulmonary effort is normal.      Breath sounds: Normal breath sounds.  Abdominal:     General: Abdomen is flat.     Palpations: Abdomen is soft.  Genitourinary:    Comments: deferred Musculoskeletal:     Cervical back: Normal range of motion and neck supple.     Comments: Examination of bilateral hips reveals no skin wounds or lesions. Mild trochanteric tenderness to palpation. His hips are extremely stiff. He has 35 degree flexion contractures bilaterally. He flexes up to about 70 degrees. He internally rotates 0 degrees, and externally rotates 5 to 10 degrees. Pain with terminal flexion and rotation.   Neurovascular intact distally.  He ambulates with an antalgic gait and cane.  Skin:    General: Skin is warm and dry.     Capillary Refill: Capillary refill takes less than 2 seconds.  Neurological:     General: No focal deficit present.     Mental Status: He is alert and oriented to person, place, and time.  Psychiatric:        Mood and Affect: Mood normal.        Behavior: Behavior normal.        Thought Content: Thought content normal.        Judgment: Judgment normal.     Vital signs in last 24 hours: @VSRANGES @  Labs:   Estimated body mass index is 42.67 kg/m as calculated from the following:   Height as of 10/03/21: 5\' 8"  (1.727 m).   Weight as of 07/06/21: 127.3 kg.   Imaging Review Plain radiographs demonstrate severe degenerative joint disease of the left hip(s). The bone quality appears to be adequate for age and reported activity level.      Assessment/Plan:  End stage arthritis, left hip(s)  The patient history, physical examination, clinical judgement of the provider and imaging studies are consistent with end stage degenerative joint disease of the left hip(s) and total hip arthroplasty is deemed medically necessary. The treatment options including medical management, injection therapy, arthroscopy and arthroplasty were discussed at length. The risks and benefits of total hip  arthroplasty were presented and reviewed. The risks due to aseptic loosening, infection, stiffness, dislocation/subluxation,  thromboembolic complications and other imponderables were discussed.  The patient acknowledged the explanation, agreed to proceed with the plan and consent was signed. Patient is being admitted for inpatient treatment for surgery, pain control, PT, OT, prophylactic antibiotics, VTE prophylaxis, progressive ambulation and ADL's and discharge planning.The patient is planning to be discharged home after an overnight stay with [facility].   Therapy Plans: [facility].  Disposition: Home with wife Planned DVT Prophylaxis: Eliquis 2.5mg  BID DME needed: walker PCP: Cleared TXA: IV Allergies: NDKA Anesthesia Concerns: None.  BMI: 38.7 Last HgbA1c: 6.4 Other: - History of PE 02/2018. - Asthma -  Pain management, Dr. Nelva Bush, Oxycodone 10mg  TID - Hold semaglutide on the 11/30/21.  - Labs pending.     Patient's anticipated LOS is less than 2 midnights, meeting these requirements: - Younger than 64 - Lives within 1 hour of care - Has a competent adult at home to recover with post-op recover - NO history of  - Chronic pain requiring opiods  - Diabetes  - Coronary Artery Disease  - Heart failure  - Heart attack  - Stroke  - DVT/VTE  - Cardiac arrhythmia  - Respiratory Failure/COPD  - Renal failure  - Anemia  - Advanced Liver disease

## 2021-11-15 NOTE — H&P (View-Only) (Signed)
TOTAL HIP ADMISSION H&P  Patient is admitted for left total hip arthroplasty.  Subjective:  Chief Complaint: left hip pain  HPI: Daniel [name], 51 y.o. male, has a history of pain and functional disability in the left hip(s) due to arthritis and patient has failed non-surgical conservative treatments for greater than 12 weeks to include NSAID's and/or analgesics, flexibility and strengthening excercises, use of assistive devices, weight reduction as appropriate, and activity modification.  Onset of symptoms was gradual starting 10 years ago with rapidlly worsening course since that time.The patient noted no past surgery on the left hip(s).  Patient currently rates pain in the left hip at 10 out of 10 with activity. Patient has night pain, worsening of pain with activity and weight bearing, trendelenberg gait, pain that interfers with activities of daily living, and pain with passive range of motion. Patient has evidence of subchondral cysts, subchondral sclerosis, periarticular osteophytes, and joint space narrowing by imaging studies. This condition presents safety issues increasing the risk of falls. There is no current active infection.  Patient Active Problem List   Diagnosis Date Noted   Morbid obesity ([place]) 07/06/2021   Hypomagnesemia 03/03/2018   HTN (hypertension) 123456   Alcoholic hepatitis without ascites 03/02/2018   Chronic anemia 03/02/2018   Alcohol abuse 03/01/2018   Tobacco abuse 03/01/2018   Asthma 03/01/2018   Exacerbation of asthma    Past Medical History:  Diagnosis Date   Asthma    Asthma    Hypertension     Past Surgical History:  Procedure Laterality Date   Dental procedure      Current Outpatient Medications  Medication Sig Dispense Refill Last Dose   Acetaminophen (TYLENOL PO) Tylenol      albuterol (VENTOLIN HFA) 108 (90 Base) MCG/ACT inhaler Inhale 1 puff into the lungs every 6 (six) hours as needed for wheezing or shortness of breath. 8.5 g 0     allopurinol (ZYLOPRIM) 300 MG tablet TAKE 1 TABLET BY MOUTH 2 TIMES DAILY. 180 tablet 1    amLODipine (NORVASC) 5 MG tablet TAKE 1 TABLET BY MOUTH EVERY DAY 90 tablet 0    buPROPion (WELLBUTRIN XL) 150 MG 24 hr tablet Take 1 tablet (150 mg total) by mouth daily. For smoking cessation 90 tablet 1    carvedilol (COREG) 6.25 MG tablet Take 1 tablet (6.25 mg total) by mouth 2 (two) times daily with a meal. 180 tablet 1    celecoxib (CELEBREX) 200 MG capsule Celebrex 200 mg capsule  Take 1 capsule every day by oral route.      clobetasol cream (TEMOVATE) 0.05 % Apply topically 2 (two) times daily. 30 g 0    hydrochlorothiazide (HYDRODIURIL) 25 MG tablet TAKE 1 TABLET (25 MG TOTAL) BY MOUTH DAILY. 90 tablet 1    Oxycodone HCl 10 MG TABS oxycodone 10 mg tablet  Take 1 tablet 3 times a day by oral route as needed.      Semaglutide,0.25 or 0.5MG /DOS, (OZEMPIC, 0.25 OR 0.5 MG/DOSE,) 2 MG/3ML SOPN Inject 0.5 mg into the skin once a week. 3 mL 1    No current facility-administered medications for this visit.   No Known Allergies  Social History   Tobacco Use   Smoking status: Some Days    Packs/day: 0.30    Types: Cigarettes   Smokeless tobacco: Never  Substance Use Topics   Alcohol use: Not Currently    Family History  Problem Relation Age of Onset   Heart attack Mother  Review of Systems  Musculoskeletal:  Positive for arthralgias and gait problem.  All other systems reviewed and are negative.   Objective:  Physical Exam Constitutional:      Appearance: Normal appearance.  HENT:     Head: Normocephalic and atraumatic.     Nose: Nose normal.     Mouth/Throat:     Mouth: Mucous membranes are moist.     Pharynx: Oropharynx is clear.  Eyes:     Conjunctiva/sclera: Conjunctivae normal.  Cardiovascular:     Rate and Rhythm: Normal rate and regular rhythm.     Pulses: Normal pulses.     Heart sounds: Normal heart sounds.  Pulmonary:     Effort: Pulmonary effort is normal.      Breath sounds: Normal breath sounds.  Abdominal:     General: Abdomen is flat.     Palpations: Abdomen is soft.  Genitourinary:    Comments: deferred Musculoskeletal:     Cervical back: Normal range of motion and neck supple.     Comments: Examination of bilateral hips reveals no skin wounds or lesions. Mild trochanteric tenderness to palpation. His hips are extremely stiff. He has 35 degree flexion contractures bilaterally. He flexes up to about 70 degrees. He internally rotates 0 degrees, and externally rotates 5 to 10 degrees. Pain with terminal flexion and rotation.   Neurovascular intact distally.  He ambulates with an antalgic gait and cane.  Skin:    General: Skin is warm and dry.     Capillary Refill: Capillary refill takes less than 2 seconds.  Neurological:     General: No focal deficit present.     Mental Status: He is alert and oriented to person, place, and time.  Psychiatric:        Mood and Affect: Mood normal.        Behavior: Behavior normal.        Thought Content: Thought content normal.        Judgment: Judgment normal.     Vital signs in last 24 hours: @VSRANGES @  Labs:   Estimated body mass index is 42.67 kg/m as calculated from the following:   Height as of 10/03/21: 5\' 8"  (1.727 m).   Weight as of 07/06/21: 127.3 kg.   Imaging Review Plain radiographs demonstrate severe degenerative joint disease of the left hip(s). The bone quality appears to be adequate for age and reported activity level.      Assessment/Plan:  End stage arthritis, left hip(s)  The patient history, physical examination, clinical judgement of the provider and imaging studies are consistent with end stage degenerative joint disease of the left hip(s) and total hip arthroplasty is deemed medically necessary. The treatment options including medical management, injection therapy, arthroscopy and arthroplasty were discussed at length. The risks and benefits of total hip  arthroplasty were presented and reviewed. The risks due to aseptic loosening, infection, stiffness, dislocation/subluxation,  thromboembolic complications and other imponderables were discussed.  The patient acknowledged the explanation, agreed to proceed with the plan and consent was signed. Patient is being admitted for inpatient treatment for surgery, pain control, PT, OT, prophylactic antibiotics, VTE prophylaxis, progressive ambulation and ADL's and discharge planning.The patient is planning to be discharged home after an overnight stay with [facility].   Therapy Plans: [facility].  Disposition: Home with wife Planned DVT Prophylaxis: Eliquis 2.5mg  BID DME needed: walker PCP: Cleared TXA: IV Allergies: NDKA Anesthesia Concerns: None.  BMI: 38.7 Last HgbA1c: 6.4 Other: - History of PE 02/2018. - Asthma -  Pain management, Dr. Nelva Bush, Oxycodone 10mg  TID - Hold semaglutide on the 11/30/21.  - Labs pending.     Patient's anticipated LOS is less than 2 midnights, meeting these requirements: - Younger than 86 - Lives within 1 hour of care - Has a competent adult at home to recover with post-op recover - NO history of  - Chronic pain requiring opiods  - Diabetes  - Coronary Artery Disease  - Heart failure  - Heart attack  - Stroke  - DVT/VTE  - Cardiac arrhythmia  - Respiratory Failure/COPD  - Renal failure  - Anemia  - Advanced Liver disease

## 2021-11-20 NOTE — Patient Instructions (Signed)
SURGICAL WAITING ROOM VISITATION Patients having surgery or a procedure may have no more than 2 support people in the waiting area - these visitors may rotate in the visitor waiting room.   Children under the age of 6 must have an adult with them who is not the patient. If the patient needs to stay at the hospital during part of their recovery, the visitor guidelines for inpatient rooms apply.  PRE-OP VISITATION  Pre-op nurse will coordinate an appropriate time for 1 support person to accompany the patient in pre-op.  This support person may not rotate.  This visitor will be contacted when the time is appropriate for the visitor to come back in the pre-op area.  Please refer to the [facility] website for the visitor guidelines for Inpatients (after your surgery is over and you are in a regular room).  You are not required to quarantine at this time prior to your surgery. However, you must do this: Hand Hygiene often Do NOT share personal items Notify your provider if you are in close contact with someone who has COVID or you develop fever 100.4 or greater, new onset of sneezing, cough, sore throat, shortness of breath or body aches.  If you test positive for Covid or have been in contact with anyone that has tested positive in the last 10 days please notify you surgeon.    Your procedure is scheduled on:  Thursday  December 01, 2021  Report to [facility] Main Entrance: [place] entrance where the [employer] is available.   Report to admitting at:  08:00   AM  +++++Call this number if you have any questions or problems the morning of surgery [phone]  Do not eat food after Midnight the night prior to your surgery/procedure.  After Midnight you may have the following liquids until  07:30 AM DAY OF SURGERY  Clear Liquid Diet Water Black Coffee (sugar ok, NO MILK/CREAM OR CREAMERS)  Tea (sugar ok, NO MILK/CREAM OR CREAMERS) regular and decaf                              Plain Jell-O  with no fruit (NO RED)                                           Fruit ices (not with fruit pulp, NO RED)                                     Popsicles (NO RED)                                                                  Juice: apple, WHITE grape, WHITE cranberry Sports drinks like Gatorade or Powerade (NO RED)                    The day of surgery:  Drink ONE (1) Pre-Surgery G2 at  07:30 AM the morning of surgery. Drink in one sitting. Do not sip.  This drink was given to  you during your hospital pre-op appointment visit. Nothing else to drink after completing the Pre-Surgery G2 : No candy, chewing gum or throat lozenges.    FOLLOW ANY ADDITIONAL PRE OP INSTRUCTIONS YOU RECEIVED FROM YOUR SURGEON'S OFFICE!!!   Oral Hygiene is also important to reduce your risk of infection.        Remember - BRUSH YOUR TEETH THE MORNING OF SURGERY WITH YOUR REGULAR TOOTHPASTE  Do NOT smoke after Midnight the night before surgery.  Take ONLY these medicines the morning of surgery with A SIP OF WATER: Carvedilol, Amlodipine, Allopurinol. IF needed, you may take Oxycodone or Tylenol for pain. You may use your Albuterol inhaler if needed.    You may not have any metal on your body including  jewelry, and body piercing  Do not wear lotions, powders,  cologne, or deodorant  Men may shave face and neck.  Contacts, Hearing Aids, dentures or bridgework may not be worn into surgery.   You may bring a small overnight bag with you on the day of surgery, only pack items that are not valuable .[facility] IS NOT RESPONSIBLE   FOR VALUABLES THAT ARE LOST OR STOLEN.   Do not bring your home medications to the hospital. The Pharmacy will dispense medications listed on your medication list to you during your admission in the Hospital.   Please read over the following fact sheets you were given: IF YOU HAVE QUESTIONS ABOUT YOUR PRE-OP INSTRUCTIONS, PLEASE CALL [phone]  (KAY)   [facility] - Preparing for Surgery Before surgery, you can play an important role.  Because skin is not sterile, your skin needs to be as free of germs as possible.  You can reduce the number of germs on your skin by washing with CHG (chlorahexidine gluconate) soap before surgery.  CHG is an antiseptic cleaner which kills germs and bonds with the skin to continue killing germs even after washing. Please DO NOT use if you have an allergy to CHG or antibacterial soaps.  If your skin becomes reddened/irritated stop using the CHG and inform your nurse when you arrive at Short Stay. Do not shave (including legs and underarms) for at least 48 hours prior to the first CHG shower.  You may shave your face/neck.  Please follow these instructions carefully:  1.  Shower with CHG Soap the night before surgery and the  morning of surgery.  2.  If you choose to wash your hair, wash your hair first as usual with your normal  shampoo.  3.  After you shampoo, rinse your hair and body thoroughly to remove the shampoo.                             4.  Use CHG as you would any other liquid soap.  You can apply chg directly to the skin and wash.  Gently with a scrungie or clean washcloth.  5.  Apply the CHG Soap to your body ONLY FROM THE NECK DOWN.   Do not use on face/ open                           Wound or open sores. Avoid contact with eyes, ears mouth and genitals (private parts).                       Wash face,  Genitals (private parts) with your  normal soap.             6.  Wash thoroughly, paying special attention to the area where your  surgery  will be performed.  7.  Thoroughly rinse your body with warm water from the neck down.  8.  DO NOT shower/wash with your normal soap after using and rinsing off the CHG Soap.            9.  Pat yourself dry with a clean towel.            10.  Wear clean pajamas.            11.  Place clean sheets on your bed the night of your first shower and do not  sleep with  pets.  ON THE DAY OF SURGERY : Do not apply any lotions/deodorants the morning of surgery.  Please wear clean clothes to the hospital/surgery center.    FAILURE TO FOLLOW THESE INSTRUCTIONS MAY RESULT IN THE CANCELLATION OF YOUR SURGERY  PATIENT SIGNATURE_________________________________  NURSE SIGNATURE__________________________________  ________________________________________________________________________   Daniel [name]    An incentive spirometer is a tool that can help keep your lungs clear and active. This tool measures how well you are filling your lungs with each breath. Taking long deep breaths may help reverse or decrease the chance of developing breathing (pulmonary) problems (especially infection) following: A long period of time when you are unable to move or be active. BEFORE THE PROCEDURE  If the spirometer includes an indicator to show your best effort, your nurse or respiratory therapist will set it to a desired goal. If possible, sit up straight or lean slightly forward. Try not to slouch. Hold the incentive spirometer in an upright position. INSTRUCTIONS FOR USE  Sit on the edge of your bed if possible, or sit up as far as you can in bed or on a chair. Hold the incentive spirometer in an upright position. Breathe out normally. Place the mouthpiece in your mouth and seal your lips tightly around it. Breathe in slowly and as deeply as possible, raising the piston or the ball toward the top of the column. Hold your breath for 3-5 seconds or for as long as possible. Allow the piston or ball to fall to the bottom of the column. Remove the mouthpiece from your mouth and breathe out normally. Rest for a few seconds and repeat Steps 1 through 7 at least 10 times every 1-2 hours when you are awake. Take your time and take a few normal breaths between deep breaths. The spirometer may include an indicator to show your best effort. Use the indicator as a goal to work  toward during each repetition. After each set of 10 deep breaths, practice coughing to be sure your lungs are clear. If you have an incision (the cut made at the time of surgery), support your incision when coughing by placing a pillow or rolled up towels firmly against it. Once you are able to get out of bed, walk around indoors and cough well. You may stop using the incentive spirometer when instructed by your caregiver.  RISKS AND COMPLICATIONS Take your time so you do not get dizzy or light-headed. If you are in pain, you may need to take or ask for pain medication before doing incentive spirometry. It is harder to take a deep breath if you are having pain. AFTER USE Rest and breathe slowly and easily. It can be helpful to keep track of a log of  your progress. Your caregiver can provide you with a simple table to help with this. If you are using the spirometer at home, follow these instructions: [place] IF:  You are having difficultly using the spirometer. You have trouble using the spirometer as often as instructed. Your pain medication is not giving enough relief while using the spirometer. You develop fever of 100.5 F (38.1 C) or higher.                                                                                                    SEEK IMMEDIATE MEDICAL CARE IF:  You cough up bloody sputum that had not been present before. You develop fever of 102 F (38.9 C) or greater. You develop worsening pain at or near the incision site. MAKE SURE YOU:  Understand these instructions. Will watch your condition. Will get help right away if you are not doing well or get worse. Document Released: 05/22/2006 Document Revised: 04/03/2011 Document Reviewed: 07/23/2006 [facility] Patient Information 2014 [place], [place].

## 2021-11-20 NOTE — Progress Notes (Signed)
COVID Vaccine received:  []  No [x]  Yes Date of any COVID positive Test in last 90 days:  PCP - Charlott Rakes, MD   [facility] clearance on chart Cardiologist - none  Chest x-ray -03-01-2018 Epic  EKG -  10-03-21  Epic Stress Test -  ECHO - 03-03-2018  Epic Cardiac Cath -   PCR screen: [x]  Ordered & Completed                      []   Not Ordered but Needs PROFEND                      []   N/A for this surgery  Surgery Plan:  []  Ambulatory  [x]  Outpatient  []  Admit Anesthesia:    []  General  [x]  Spinal  []   Choice  Bowel Prep - [x]  No  []   Yes _____________  Pacemaker/ICD device     [x]  N/A Spinal Cord Stimulator:[x]  No []  Yes      (Remind patient to bring remote DOS) Other Implants:   History of Sleep Apnea? [x]  No []  Yes   Sleep Study Date:   CPAP used?- [x]  No []  Yes  (Instruct to bring their mask & Tubing)  Does the patient monitor blood sugar? []  No []  Yes  []  N/A Does patient have a [employer] or Dexacom? []  No []  Yes   Fasting Blood Sugar Ranges-  Checks Blood Sugar _____ times a day  Blood Thinner Instructions:  None Aspirin Instructions: Last Dose:  ERAS Protocol Ordered: []  No  [x]  Yes PRE-SURGERY []  ENSURE  [x]  G2  []  No Drink Ordered  Comments: Patient is on [place] ( Wednesdays) he will not inject this on Wednesday  11-30-21. Will resume the week after his surgery.   Activity level: Patient can / can not climb a flight of stairs without difficulty;  []  No CP  []  No SOB,  but would have ______   Patient can / can not perform ADLs without assistance.   Anesthesia review: DM2, ETOH- alcoholic Hepatitis, Hx PE (2020), Asthma, HTN, some days smoker  Patient denies shortness of breath, fever, cough and chest pain at PAT appointment.  Patient verbalized understanding and agreement to the Pre-Surgical Instructions that were given to them at this PAT appointment. Patient was also educated of the need to review these PAT  instructions again prior to his/her surgery.I reviewed the appropriate phone numbers to call if they have any and questions or concerns.

## 2021-11-22 ENCOUNTER — Other Ambulatory Visit: Payer: Self-pay

## 2021-11-22 ENCOUNTER — Encounter (HOSPITAL_COMMUNITY)
Admission: RE | Admit: 2021-11-22 | Discharge: 2021-11-22 | Disposition: A | Discharge status: Home / Self Care | Payer: BC Managed Care – PPO | Source: Ambulatory Visit | Attending: Orthopedic Surgery | Admitting: Orthopedic Surgery

## 2021-11-22 ENCOUNTER — Encounter (HOSPITAL_COMMUNITY): Payer: Self-pay

## 2021-11-22 VITALS — BP 133/90 | HR 90 | Temp 98.4°F | Resp 20 | Ht 68.0 in | Wt 260.0 lb

## 2021-11-22 DIAGNOSIS — I10 Essential (primary) hypertension: Secondary | ICD-10-CM

## 2021-11-22 DIAGNOSIS — Z789 Other specified health status: Secondary | ICD-10-CM

## 2021-11-22 DIAGNOSIS — K759 Inflammatory liver disease, unspecified: Secondary | ICD-10-CM

## 2021-11-22 DIAGNOSIS — Z01812 Encounter for preprocedural laboratory examination: Secondary | ICD-10-CM | POA: Insufficient documentation

## 2021-11-22 DIAGNOSIS — R7303 Prediabetes: Secondary | ICD-10-CM

## 2021-11-22 DIAGNOSIS — E119 Type 2 diabetes mellitus without complications: Secondary | ICD-10-CM | POA: Insufficient documentation

## 2021-11-22 DIAGNOSIS — Z01818 Encounter for other preprocedural examination: Secondary | ICD-10-CM

## 2021-11-22 HISTORY — DX: Anxiety disorder, unspecified: F41.9

## 2021-11-22 HISTORY — DX: Prediabetes: R73.03

## 2021-11-22 HISTORY — DX: Unspecified osteoarthritis, unspecified site: M19.90

## 2021-11-22 LAB — COMPREHENSIVE METABOLIC PANEL
ALT: 11 U/L (ref 0–44)
AST: 17 U/L (ref 15–41)
Albumin: 3.8 g/dL (ref 3.5–5.0)
Alkaline Phosphatase: 59 U/L (ref 38–126)
Anion gap: 8 (ref 5–15)
BUN: 11 mg/dL (ref 6–20)
CO2: 26 mmol/L (ref 22–32)
Calcium: 9.1 mg/dL (ref 8.9–10.3)
Chloride: 104 mmol/L (ref 98–111)
Creatinine, Ser: 0.76 mg/dL (ref 0.61–1.24)
GFR, Estimated: >60 mL/min (ref >60)
Glucose, Bld: 107 mg/dL — ABNORMAL HIGH (ref 70–99)
Potassium: 3.4 mmol/L — ABNORMAL LOW (ref 3.5–5.1)
Sodium: 138 mmol/L (ref 135–145)
Total Bilirubin: 0.7 mg/dL (ref 0.3–1.2)
Total Protein: 7.7 g/dL (ref 6.5–8.1)

## 2021-11-22 LAB — CBC
HCT: 41.8 % (ref 39.0–52.0)
Hemoglobin: 14.2 g/dL (ref 13.0–17.0)
MCH: 31.2 pg (ref 26.0–34.0)
MCHC: 34 g/dL (ref 30.0–36.0)
MCV: 91.9 fL (ref 80.0–100.0)
Platelets: 299 10*3/uL (ref 150–400)
RBC: 4.55 MIL/uL (ref 4.22–5.81)
RDW: 15.4 % (ref 11.5–15.5)
WBC: 10.1 10*3/uL (ref 4.0–10.5)
nRBC: 0 % (ref 0.0–0.2)

## 2021-11-22 LAB — SURGICAL PCR SCREEN
MRSA, PCR: NEGATIVE
Staphylococcus aureus: POSITIVE — AB

## 2021-11-22 LAB — GLUCOSE, CAPILLARY: Glucose-Capillary: 109 mg/dL — ABNORMAL HIGH (ref 70–99)

## 2021-11-22 LAB — HEMOGLOBIN A1C
Hgb A1c MFr Bld: 5.9 % — ABNORMAL HIGH (ref 4.8–5.6)
Mean Plasma Glucose: 122.63 mg/dL

## 2021-11-23 NOTE — Progress Notes (Signed)
Patient's PCR screen is positive for STAPH. Appropriate notes have been placed on the patient's chart. This note has been routed to Dr. Lyla Glassing for review. The Patient's surgery is currently scheduled for:  Thursday  December 01, 2021 at [facility].  Leota Jacobsen, BSN, CVRN-BC   Pre-Surgical Testing Nurse [place]  [phone]

## 2021-12-01 ENCOUNTER — Encounter (HOSPITAL_COMMUNITY): Payer: Self-pay | Admitting: Orthopedic Surgery

## 2021-12-01 ENCOUNTER — Other Ambulatory Visit: Payer: Self-pay

## 2021-12-01 ENCOUNTER — Ambulatory Visit (HOSPITAL_COMMUNITY): Payer: BC Managed Care – PPO

## 2021-12-01 ENCOUNTER — Encounter (HOSPITAL_COMMUNITY)
Admission: RE | Disposition: A | Discharge status: Home / Self Care | Payer: Self-pay | Source: Ambulatory Visit | Attending: Orthopedic Surgery

## 2021-12-01 ENCOUNTER — Ambulatory Visit (HOSPITAL_COMMUNITY): Payer: BC Managed Care – PPO | Admitting: Anesthesiology

## 2021-12-01 ENCOUNTER — Inpatient Hospital Stay (HOSPITAL_COMMUNITY)
Admission: RE | Admit: 2021-12-01 | Discharge: 2021-12-04 | DRG: 470 | Disposition: A | Discharge status: Home / Self Care | Payer: BC Managed Care – PPO | Source: Ambulatory Visit | Attending: Orthopedic Surgery | Admitting: Orthopedic Surgery

## 2021-12-01 DIAGNOSIS — Z8249 Family history of ischemic heart disease and other diseases of the circulatory system: Secondary | ICD-10-CM

## 2021-12-01 DIAGNOSIS — Z7901 Long term (current) use of anticoagulants: Secondary | ICD-10-CM | POA: Diagnosis not present

## 2021-12-01 DIAGNOSIS — D638 Anemia in other chronic diseases classified elsewhere: Secondary | ICD-10-CM | POA: Diagnosis not present

## 2021-12-01 DIAGNOSIS — Z86711 Personal history of pulmonary embolism: Secondary | ICD-10-CM

## 2021-12-01 DIAGNOSIS — D62 Acute posthemorrhagic anemia: Secondary | ICD-10-CM | POA: Diagnosis not present

## 2021-12-01 DIAGNOSIS — N390 Urinary tract infection, site not specified: Secondary | ICD-10-CM | POA: Diagnosis not present

## 2021-12-01 DIAGNOSIS — R7303 Prediabetes: Secondary | ICD-10-CM | POA: Diagnosis not present

## 2021-12-01 DIAGNOSIS — I10 Essential (primary) hypertension: Secondary | ICD-10-CM | POA: Diagnosis not present

## 2021-12-01 DIAGNOSIS — N179 Acute kidney failure, unspecified: Secondary | ICD-10-CM | POA: Diagnosis not present

## 2021-12-01 DIAGNOSIS — Z96642 Presence of left artificial hip joint: Secondary | ICD-10-CM | POA: Diagnosis not present

## 2021-12-01 DIAGNOSIS — Z79899 Other long term (current) drug therapy: Secondary | ICD-10-CM | POA: Diagnosis not present

## 2021-12-01 DIAGNOSIS — R000 Tachycardia, unspecified: Secondary | ICD-10-CM | POA: Diagnosis not present

## 2021-12-01 DIAGNOSIS — Z0389 Encounter for observation for other suspected diseases and conditions ruled out: Secondary | ICD-10-CM | POA: Diagnosis not present

## 2021-12-01 DIAGNOSIS — Z9889 Other specified postprocedural states: Secondary | ICD-10-CM | POA: Diagnosis not present

## 2021-12-01 DIAGNOSIS — F1010 Alcohol abuse, uncomplicated: Secondary | ICD-10-CM | POA: Diagnosis present

## 2021-12-01 DIAGNOSIS — F419 Anxiety disorder, unspecified: Secondary | ICD-10-CM | POA: Diagnosis present

## 2021-12-01 DIAGNOSIS — E876 Hypokalemia: Secondary | ICD-10-CM | POA: Diagnosis not present

## 2021-12-01 DIAGNOSIS — F17210 Nicotine dependence, cigarettes, uncomplicated: Secondary | ICD-10-CM | POA: Diagnosis present

## 2021-12-01 DIAGNOSIS — M1612 Unilateral primary osteoarthritis, left hip: Principal | ICD-10-CM | POA: Diagnosis present

## 2021-12-01 DIAGNOSIS — Z7985 Long-term (current) use of injectable non-insulin antidiabetic drugs: Secondary | ICD-10-CM

## 2021-12-01 DIAGNOSIS — K7010 Alcoholic hepatitis without ascites: Secondary | ICD-10-CM | POA: Diagnosis present

## 2021-12-01 DIAGNOSIS — Z471 Aftercare following joint replacement surgery: Secondary | ICD-10-CM | POA: Diagnosis not present

## 2021-12-01 DIAGNOSIS — Z6839 Body mass index (BMI) 39.0-39.9, adult: Secondary | ICD-10-CM

## 2021-12-01 DIAGNOSIS — M1611 Unilateral primary osteoarthritis, right hip: Secondary | ICD-10-CM | POA: Diagnosis not present

## 2021-12-01 HISTORY — PX: TOTAL HIP ARTHROPLASTY: SHX124

## 2021-12-01 LAB — ABO/RH: ABO/RH(D): AB POS

## 2021-12-01 LAB — TYPE AND SCREEN
ABO/RH(D): AB POS
Antibody Screen: NEGATIVE

## 2021-12-01 LAB — GLUCOSE, CAPILLARY: Glucose-Capillary: 119 mg/dL — ABNORMAL HIGH (ref 70–99)

## 2021-12-01 SURGERY — ARTHROPLASTY, HIP, TOTAL, ANTERIOR APPROACH
Anesthesia: Monitor Anesthesia Care | Site: Hip | Laterality: Left

## 2021-12-01 MED ORDER — ONDANSETRON HCL 4 MG PO TABS: 4.0000 mg | ORAL_TABLET | Freq: Four times a day (QID) | ORAL | Status: DC | PRN

## 2021-12-01 MED ORDER — OXYCODONE HCL 5 MG PO TABS
5.0000 mg | ORAL_TABLET | ORAL | Status: DC | PRN
  Administered 2021-12-01: 5 mg via ORAL

## 2021-12-01 MED ORDER — ACETAMINOPHEN 325 MG PO TABS: 325.0000 mg | ORAL_TABLET | Freq: Four times a day (QID) | ORAL | Status: DC | PRN

## 2021-12-01 MED ORDER — MIDAZOLAM HCL 2 MG/2ML IJ SOLN
INTRAMUSCULAR | Status: AC
  Filled 2021-12-01: qty 2

## 2021-12-01 MED ORDER — POVIDONE-IODINE 10 % EX SWAB: 2.0000 | Freq: Once | CUTANEOUS | Status: AC

## 2021-12-01 MED ORDER — KETOROLAC TROMETHAMINE 30 MG/ML IJ SOLN
INTRAMUSCULAR | Status: AC
  Filled 2021-12-01: qty 1

## 2021-12-01 MED ORDER — ORAL CARE MOUTH RINSE: 15.0000 mL | Freq: Once | OROMUCOSAL | Status: AC

## 2021-12-01 MED ORDER — CHLORHEXIDINE GLUCONATE 0.12 % MT SOLN
15.0000 mL | Freq: Once | OROMUCOSAL | Status: AC
  Administered 2021-12-01: 15 mL via OROMUCOSAL

## 2021-12-01 MED ORDER — BUPIVACAINE-EPINEPHRINE (PF) 0.25% -1:200000 IJ SOLN
INTRAMUSCULAR | Status: AC
  Filled 2021-12-01: qty 30

## 2021-12-01 MED ORDER — OXYCODONE HCL 5 MG PO TABS
5.0000 mg | ORAL_TABLET | Freq: Once | ORAL | Status: AC | PRN
  Administered 2021-12-01: 5 mg via ORAL

## 2021-12-01 MED ORDER — POVIDONE-IODINE 10 % EX SWAB
2.0000 | Freq: Once | CUTANEOUS | Status: AC
  Administered 2021-12-01: 2 via TOPICAL

## 2021-12-01 MED ORDER — HYDROMORPHONE HCL 1 MG/ML IJ SOLN
0.2500 mg | INTRAMUSCULAR | Status: DC | PRN
  Administered 2021-12-01 (×2): 0.5 mg via INTRAVENOUS

## 2021-12-01 MED ORDER — HYDROMORPHONE HCL 1 MG/ML IJ SOLN
INTRAMUSCULAR | Status: AC
  Filled 2021-12-01: qty 1

## 2021-12-01 MED ORDER — SODIUM CHLORIDE (PF) 0.9 % IJ SOLN
INTRAMUSCULAR | Status: AC
  Filled 2021-12-01: qty 50

## 2021-12-01 MED ORDER — OXYCODONE HCL 5 MG PO TABS
10.0000 mg | ORAL_TABLET | ORAL | Status: DC | PRN
  Administered 2021-12-01 – 2021-12-04 (×12): 15 mg via ORAL
  Filled 2021-12-01 (×12): qty 3

## 2021-12-01 MED ORDER — OXYCODONE HCL 5 MG PO TABS
ORAL_TABLET | ORAL | Status: AC
  Filled 2021-12-01: qty 1

## 2021-12-01 MED ORDER — APIXABAN 2.5 MG PO TABS
2.5000 mg | ORAL_TABLET | Freq: Two times a day (BID) | ORAL | Status: DC
  Administered 2021-12-02 – 2021-12-04 (×5): 2.5 mg via ORAL
  Filled 2021-12-01 (×5): qty 1

## 2021-12-01 MED ORDER — MIDAZOLAM HCL 2 MG/2ML IJ SOLN
INTRAMUSCULAR | Status: DC | PRN
  Administered 2021-12-01: 2 mg via INTRAVENOUS

## 2021-12-01 MED ORDER — PHENYLEPHRINE 80 MCG/ML (10ML) SYRINGE FOR IV PUSH (FOR BLOOD PRESSURE SUPPORT)
PREFILLED_SYRINGE | INTRAVENOUS | Status: AC
  Filled 2021-12-01: qty 10

## 2021-12-01 MED ORDER — OXYCODONE HCL 5 MG/5ML PO SOLN: 5.0000 mg | Freq: Once | ORAL | Status: AC | PRN

## 2021-12-01 MED ORDER — DOCUSATE SODIUM 100 MG PO CAPS
100.0000 mg | ORAL_CAPSULE | Freq: Two times a day (BID) | ORAL | Status: DC
  Administered 2021-12-01 – 2021-12-04 (×6): 100 mg via ORAL
  Filled 2021-12-01 (×6): qty 1

## 2021-12-01 MED ORDER — SENNA 8.6 MG PO TABS
1.0000 | ORAL_TABLET | Freq: Two times a day (BID) | ORAL | Status: DC
  Administered 2021-12-01 – 2021-12-04 (×6): 8.6 mg via ORAL
  Filled 2021-12-01 (×6): qty 1

## 2021-12-01 MED ORDER — WATER FOR IRRIGATION, STERILE IR SOLN
Status: DC | PRN
  Administered 2021-12-01: 2000 mL

## 2021-12-01 MED ORDER — PROPOFOL 500 MG/50ML IV EMUL
INTRAVENOUS | Status: AC
  Filled 2021-12-01: qty 50

## 2021-12-01 MED ORDER — PROPOFOL 10 MG/ML IV BOLUS
INTRAVENOUS | Status: DC | PRN
  Administered 2021-12-01: 30 mg via INTRAVENOUS

## 2021-12-01 MED ORDER — HYDROMORPHONE HCL 1 MG/ML IJ SOLN
0.5000 mg | INTRAMUSCULAR | Status: DC | PRN
  Administered 2021-12-01 – 2021-12-02 (×2): 1 mg via INTRAVENOUS
  Filled 2021-12-01: qty 1

## 2021-12-01 MED ORDER — METHOCARBAMOL 500 MG IVPB - SIMPLE MED
500.0000 mg | Freq: Four times a day (QID) | INTRAVENOUS | Status: DC | PRN
  Administered 2021-12-01: 500 mg via INTRAVENOUS
  Filled 2021-12-01: qty 55

## 2021-12-01 MED ORDER — SODIUM CHLORIDE 0.9 % IV SOLN: INTRAVENOUS | Status: DC

## 2021-12-01 MED ORDER — FENTANYL CITRATE (PF) 100 MCG/2ML IJ SOLN
INTRAMUSCULAR | Status: AC
  Filled 2021-12-01: qty 2

## 2021-12-01 MED ORDER — PROPOFOL 500 MG/50ML IV EMUL
INTRAVENOUS | Status: DC | PRN
  Administered 2021-12-01: 75 ug/kg/min via INTRAVENOUS

## 2021-12-01 MED ORDER — KETOROLAC TROMETHAMINE 30 MG/ML IJ SOLN
INTRAMUSCULAR | Status: DC | PRN
  Administered 2021-12-01: 30 mg via INTRAMUSCULAR

## 2021-12-01 MED ORDER — ISOPROPYL ALCOHOL 70 % SOLN
Status: DC | PRN
  Administered 2021-12-01: 1 via TOPICAL

## 2021-12-01 MED ORDER — LACTATED RINGERS IV SOLN: INTRAVENOUS | Status: DC

## 2021-12-01 MED ORDER — MENTHOL 3 MG MT LOZG: 1.0000 | LOZENGE | OROMUCOSAL | Status: DC | PRN

## 2021-12-01 MED ORDER — ONDANSETRON HCL 4 MG/2ML IJ SOLN: 4.0000 mg | Freq: Four times a day (QID) | INTRAMUSCULAR | Status: DC | PRN

## 2021-12-01 MED ORDER — METHOCARBAMOL 500 MG PO TABS
500.0000 mg | ORAL_TABLET | Freq: Four times a day (QID) | ORAL | Status: DC | PRN
  Administered 2021-12-01 – 2021-12-04 (×6): 500 mg via ORAL
  Filled 2021-12-01 (×6): qty 1

## 2021-12-01 MED ORDER — CARVEDILOL 6.25 MG PO TABS
6.2500 mg | ORAL_TABLET | Freq: Two times a day (BID) | ORAL | Status: DC
  Administered 2021-12-01 – 2021-12-04 (×6): 6.25 mg via ORAL
  Filled 2021-12-01 (×6): qty 1

## 2021-12-01 MED ORDER — PHENYLEPHRINE HCL-NACL 20-0.9 MG/250ML-% IV SOLN
INTRAVENOUS | Status: DC | PRN
  Administered 2021-12-01: 50 ug/min via INTRAVENOUS

## 2021-12-01 MED ORDER — ALBUTEROL SULFATE (2.5 MG/3ML) 0.083% IN NEBU: 3.0000 mL | INHALATION_SOLUTION | Freq: Four times a day (QID) | RESPIRATORY_TRACT | Status: DC | PRN

## 2021-12-01 MED ORDER — METHOCARBAMOL 500 MG IVPB - SIMPLE MED
INTRAVENOUS | Status: AC
  Filled 2021-12-01: qty 55

## 2021-12-01 MED ORDER — ACETAMINOPHEN 500 MG PO TABS
1000.0000 mg | ORAL_TABLET | Freq: Once | ORAL | Status: AC
  Administered 2021-12-01: 1000 mg via ORAL
  Filled 2021-12-01: qty 2

## 2021-12-01 MED ORDER — TRANEXAMIC ACID-NACL 1000-0.7 MG/100ML-% IV SOLN
1000.0000 mg | INTRAVENOUS | Status: AC
  Administered 2021-12-01: 1000 mg via INTRAVENOUS
  Filled 2021-12-01: qty 100

## 2021-12-01 MED ORDER — ALUM & MAG HYDROXIDE-SIMETH 200-200-20 MG/5ML PO SUSP: 30.0000 mL | ORAL | Status: DC | PRN

## 2021-12-01 MED ORDER — DIPHENHYDRAMINE HCL 12.5 MG/5ML PO ELIX: 12.5000 mg | ORAL_SOLUTION | ORAL | Status: DC | PRN

## 2021-12-01 MED ORDER — SODIUM CHLORIDE 0.9 % IR SOLN
Status: DC | PRN
  Administered 2021-12-01: 4000 mL

## 2021-12-01 MED ORDER — ONDANSETRON HCL 4 MG/2ML IJ SOLN
INTRAMUSCULAR | Status: DC | PRN
  Administered 2021-12-01: 4 mg via INTRAVENOUS

## 2021-12-01 MED ORDER — FENTANYL CITRATE PF 50 MCG/ML IJ SOSY
PREFILLED_SYRINGE | INTRAMUSCULAR | Status: AC
  Filled 2021-12-01: qty 1

## 2021-12-01 MED ORDER — POLYETHYLENE GLYCOL 3350 17 G PO PACK: 17.0000 g | PACK | Freq: Every day | ORAL | Status: DC | PRN

## 2021-12-01 MED ORDER — METOCLOPRAMIDE HCL 5 MG/ML IJ SOLN: 5.0000 mg | Freq: Three times a day (TID) | INTRAMUSCULAR | Status: DC | PRN

## 2021-12-01 MED ORDER — ONDANSETRON HCL 4 MG/2ML IJ SOLN
INTRAMUSCULAR | Status: AC
  Filled 2021-12-01: qty 2

## 2021-12-01 MED ORDER — FENTANYL CITRATE (PF) 100 MCG/2ML IJ SOLN
INTRAMUSCULAR | Status: DC | PRN
  Administered 2021-12-01: 100 ug via INTRAVENOUS

## 2021-12-01 MED ORDER — FENTANYL CITRATE PF 50 MCG/ML IJ SOSY
PREFILLED_SYRINGE | INTRAMUSCULAR | Status: AC
  Filled 2021-12-01: qty 2

## 2021-12-01 MED ORDER — CEFAZOLIN SODIUM-DEXTROSE 2-4 GM/100ML-% IV SOLN
2.0000 g | INTRAVENOUS | Status: AC
  Administered 2021-12-01: 2 g via INTRAVENOUS
  Filled 2021-12-01: qty 100

## 2021-12-01 MED ORDER — METOCLOPRAMIDE HCL 5 MG PO TABS: 5.0000 mg | ORAL_TABLET | Freq: Three times a day (TID) | ORAL | Status: DC | PRN

## 2021-12-01 MED ORDER — BUPIVACAINE IN DEXTROSE 0.75-8.25 % IT SOLN
INTRATHECAL | Status: DC | PRN
  Administered 2021-12-01: 1.8 mL via INTRATHECAL

## 2021-12-01 MED ORDER — CEFAZOLIN SODIUM-DEXTROSE 2-4 GM/100ML-% IV SOLN
2.0000 g | Freq: Four times a day (QID) | INTRAVENOUS | Status: AC
  Administered 2021-12-01 (×2): 2 g via INTRAVENOUS
  Filled 2021-12-01 (×2): qty 100

## 2021-12-01 MED ORDER — PHENOL 1.4 % MT LIQD: 1.0000 | OROMUCOSAL | Status: DC | PRN

## 2021-12-01 MED ORDER — FENTANYL CITRATE PF 50 MCG/ML IJ SOSY
25.0000 ug | PREFILLED_SYRINGE | INTRAMUSCULAR | Status: DC | PRN
  Administered 2021-12-01 (×3): 50 ug via INTRAVENOUS

## 2021-12-01 MED ORDER — ALLOPURINOL 300 MG PO TABS
300.0000 mg | ORAL_TABLET | Freq: Two times a day (BID) | ORAL | Status: DC
  Administered 2021-12-01 – 2021-12-04 (×6): 300 mg via ORAL
  Filled 2021-12-01 (×6): qty 1

## 2021-12-01 MED ORDER — PHENYLEPHRINE 80 MCG/ML (10ML) SYRINGE FOR IV PUSH (FOR BLOOD PRESSURE SUPPORT)
PREFILLED_SYRINGE | INTRAVENOUS | Status: DC | PRN
  Administered 2021-12-01 (×2): 160 ug via INTRAVENOUS

## 2021-12-01 MED ORDER — SODIUM CHLORIDE (PF) 0.9 % IJ SOLN
INTRAMUSCULAR | Status: DC | PRN
  Administered 2021-12-01: 30 mL

## 2021-12-01 MED ORDER — ORAL CARE MOUTH RINSE: 15.0000 mL | OROMUCOSAL | Status: DC | PRN

## 2021-12-01 MED ORDER — AMLODIPINE BESYLATE 5 MG PO TABS
5.0000 mg | ORAL_TABLET | Freq: Every day | ORAL | Status: DC
  Administered 2021-12-02 – 2021-12-04 (×3): 5 mg via ORAL
  Filled 2021-12-01 (×3): qty 1

## 2021-12-01 MED ORDER — CELECOXIB 200 MG PO CAPS
200.0000 mg | ORAL_CAPSULE | Freq: Two times a day (BID) | ORAL | Status: DC
  Administered 2021-12-01: 200 mg via ORAL
  Filled 2021-12-01: qty 1

## 2021-12-01 MED ORDER — BUPIVACAINE-EPINEPHRINE 0.25% -1:200000 IJ SOLN
INTRAMUSCULAR | Status: DC | PRN
  Administered 2021-12-01: 30 mL

## 2021-12-01 SURGICAL SUPPLY — 52 items
BAG COUNTER SPONGE SURGICOUNT (BAG) IMPLANT
BAG DECANTER FOR FLEXI CONT (MISCELLANEOUS) IMPLANT
BAG ZIPLOCK 12X15 (MISCELLANEOUS) IMPLANT
CHLORAPREP W/TINT 26 (MISCELLANEOUS) ×1 IMPLANT
COVER PERINEAL POST (MISCELLANEOUS) ×1 IMPLANT
COVER SURGICAL LIGHT HANDLE (MISCELLANEOUS) ×1 IMPLANT
DERMABOND ADVANCED .7 DNX12 (GAUZE/BANDAGES/DRESSINGS) ×2 IMPLANT
DRAPE IMP U-DRAPE 54X76 (DRAPES) ×1 IMPLANT
DRAPE SHEET LG 3/4 BI-LAMINATE (DRAPES) ×3 IMPLANT
DRAPE STERI IOBAN 125X83 (DRAPES) ×1 IMPLANT
DRAPE U-SHAPE 47X51 STRL (DRAPES) ×2 IMPLANT
DRSG AQUACEL AG ADV 3.5X10 (GAUZE/BANDAGES/DRESSINGS) ×1 IMPLANT
ELECT REM PT RETURN 15FT ADLT (MISCELLANEOUS) ×1 IMPLANT
GAUZE SPONGE 4X4 12PLY STRL (GAUZE/BANDAGES/DRESSINGS) ×1 IMPLANT
GLOVE BIO SURGEON STRL SZ8.5 (GLOVE) ×2 IMPLANT
GLOVE BIOGEL M 7.0 STRL (GLOVE) ×1 IMPLANT
GLOVE BIOGEL PI IND STRL 7.5 (GLOVE) ×1 IMPLANT
GLOVE BIOGEL PI IND STRL 8.5 (GLOVE) ×1 IMPLANT
GOWN SPEC L3 XXLG W/TWL (GOWN DISPOSABLE) ×1 IMPLANT
GOWN STRL REUS W/ TWL XL LVL3 (GOWN DISPOSABLE) ×1 IMPLANT
GOWN STRL REUS W/TWL XL LVL3 (GOWN DISPOSABLE) ×1
HANDPIECE INTERPULSE COAX TIP (DISPOSABLE) ×1
HEAD CERAMIC BIOLOX 36 (Head) IMPLANT
HOLDER FOLEY CATH W/STRAP (MISCELLANEOUS) ×1 IMPLANT
HOOD PEEL AWAY T7 (MISCELLANEOUS) ×3 IMPLANT
KIT TURNOVER KIT A (KITS) IMPLANT
LINER ACETAB VIT E +5 36 F (Liner) IMPLANT
MANIFOLD NEPTUNE II (INSTRUMENTS) ×1 IMPLANT
MARKER SKIN DUAL TIP RULER LAB (MISCELLANEOUS) ×1 IMPLANT
NDL SAFETY ECLIP 18X1.5 (MISCELLANEOUS) ×1 IMPLANT
NDL SPNL 18GX3.5 QUINCKE PK (NEEDLE) ×1 IMPLANT
NEEDLE SPNL 18GX3.5 QUINCKE PK (NEEDLE) ×1 IMPLANT
PACK ANTERIOR HIP CUSTOM (KITS) ×1 IMPLANT
PENCIL SMOKE EVACUATOR (MISCELLANEOUS) IMPLANT
SAW OSC TIP CART 19.5X105X1.3 (SAW) ×1 IMPLANT
SEALER BIPOLAR AQUA 6.0 (INSTRUMENTS) ×1 IMPLANT
SET HNDPC FAN SPRY TIP SCT (DISPOSABLE) ×1 IMPLANT
SHELL ACET G7 4H 56 SZF (Shell) IMPLANT
SOLUTION PRONTOSAN WOUND 350ML (IRRIGATION / IRRIGATOR) ×1 IMPLANT
SPIKE FLUID TRANSFER (MISCELLANEOUS) ×1 IMPLANT
STEM FEM CEMTL 133D 14X113X196 (Stem) IMPLANT
SUT MNCRL AB 3-0 PS2 18 (SUTURE) ×1 IMPLANT
SUT MON AB 2-0 CT1 36 (SUTURE) ×1 IMPLANT
SUT STRATAFIX PDO 1 14 VIOLET (SUTURE) ×1
SUT STRATFX PDO 1 14 VIOLET (SUTURE) ×1
SUT VIC AB 2-0 CT1 27 (SUTURE)
SUT VIC AB 2-0 CT1 TAPERPNT 27 (SUTURE) IMPLANT
SUTURE STRATFX PDO 1 14 VIOLET (SUTURE) ×1 IMPLANT
SYR 3ML LL SCALE MARK (SYRINGE) ×1 IMPLANT
TRAY FOLEY MTR SLVR 16FR STAT (SET/KITS/TRAYS/PACK) IMPLANT
TUBE SUCTION HIGH CAP CLEAR NV (SUCTIONS) ×1 IMPLANT
WATER STERILE IRR 1000ML POUR (IV SOLUTION) ×1 IMPLANT

## 2021-12-01 NOTE — Op Note (Signed)
OPERATIVE REPORT  SURGEON: Rod Can, MD   ASSISTANT: Larene Pickett, PA-C.  PREOPERATIVE DIAGNOSIS: Left hip arthritis.   POSTOPERATIVE DIAGNOSIS: Left hip arthritis.   PROCEDURE: Left total hip arthroplasty, anterior approach.   IMPLANTS: Biomet Taperloc Complete Microplasty stem, size 14 x 113 mm, high offset. Biomet G7 OsseoTi Cup, size 56 mm. Biomet Vivacit-E liner, size 36 mm, F, +5 neutral. Biomet Biolox ceramic head ball, size 36 - 3 mm.  ANESTHESIA:  MAC and Spinal  ESTIMATED BLOOD LOSS:-300 mL    ANTIBIOTICS: 2g Ancef.  DRAINS: None.  COMPLICATIONS: None.   CONDITION: PACU - hemodynamically stable.   BRIEF CLINICAL NOTE: Daniel [name] is a 51 y.o. male with a long-standing history of Left hip arthritis. After failing conservative management, the patient was indicated for total hip arthroplasty. The risks, benefits, and alternatives to the procedure were explained, and the patient elected to proceed.  PROCEDURE IN DETAIL: Surgical site was marked by myself in the pre-op holding area. Once inside the operating room, spinal anesthesia was obtained, and a foley catheter was inserted. The patient was then positioned on the Hana table.  All bony prominences were well padded.  The hip was prepped and draped in the normal sterile surgical fashion.  A time-out was called verifying side and site of surgery. The patient received IV antibiotics within 60 minutes of beginning the procedure.   Bikini incision was made, and superficial dissection was performed lateral to the ASIS. The direct anterior approach to the hip was performed through the Hueter interval.  Lateral femoral circumflex vessels were treated with the Auqumantys. The anterior capsule was exposed and an inverted T capsulotomy was made. The femoral neck cut was made to the level of the templated cut.  A corkscrew was placed into the head and the head was removed.  The femoral head was found to have eburnated  bone. The head was passed to the back table and was measured. Pubofemoral ligament was released off of the calcar, taking care to stay on bone. Superior capsule was released from the greater trochanter, taking care to stay lateral to the posterior border of the femoral neck in order to preserve the short external rotators.   Acetabular exposure was achieved, and the pulvinar and labrum were excised. Sequential reaming of the acetabulum was then performed up to a size 55 mm reamer. A 56 mm cup was then opened and impacted into place at approximately 40 degrees of abduction and 20 degrees of anteversion. The final polyethylene liner was impacted into place and acetabular osteophytes were removed.    I then gained femoral exposure taking care to protect the abductors and greater trochanter.  This was performed using standard external rotation, extension, and adduction.  A cookie cutter was used to enter the femoral canal, and then the femoral canal finder was placed.  Sequential broaching was performed up to a size 14.  Calcar planer was used on the femoral neck remnant.  I placed a high offset neck and a trial head ball.  The hip was reduced.  Leg lengths and offset were checked fluoroscopically.  The hip was dislocated and trial components were removed.  The final implants were placed, and the hip was reduced.  Fluoroscopy was used to confirm component position and leg lengths.  At 90 degrees of external rotation and full extension, the hip was stable to an anterior directed force.   The wound was copiously irrigated with Prontosan solution and normal saline using pule lavage.  Marcaine solution was injected into the periarticular soft tissue.  The wound was closed in layers using #1 Vicryl and V-Loc for the fascia, 2-0 Vicryl for the subcutaneous fat, 2-0 Monocryl for the deep dermal layer, 3-0 running Monocryl subcuticular stitch, and Dermabond for the skin.  Once the glue was fully dried, an Aquacell Ag  dressing was applied.  The patient was transported to the recovery room in stable condition.  Sponge, needle, and instrument counts were correct at the end of the case x2.  The patient tolerated the procedure well and there were no known complications.  Please note that a surgical assistant was a medical necessity for this procedure to perform it in a safe and expeditious manner. Assistant was necessary to provide appropriate retraction of vital neurovascular structures, to prevent femoral fracture, and to allow for anatomic placement of the prosthesis.

## 2021-12-01 NOTE — Anesthesia Procedure Notes (Signed)
Procedure Name: MAC Date/Time: 12/01/2021 9:34 AM  Performed by: Lollie Sails, CRNAPre-anesthesia Checklist: Patient identified, Emergency Drugs available, Suction available, Patient being monitored and Timeout performed Oxygen Delivery Method: Simple face mask Placement Confirmation: positive ETCO2

## 2021-12-01 NOTE — Anesthesia Procedure Notes (Signed)
Spinal  Patient location during procedure: OR Start time: 12/01/2021 9:38 AM End time: 12/01/2021 9:40 AM Staffing Performed: anesthesiologist  Anesthesiologist: Atilano Median, DO Performed by: Atilano Median, DO Authorized by: Atilano Median, DO   Preanesthetic Checklist Completed: patient identified, IV checked, site marked, risks and benefits discussed, surgical consent, monitors and equipment checked, pre-op evaluation and timeout performed Spinal Block Patient position: sitting Prep: DuraPrep Patient monitoring: heart rate, cardiac monitor, continuous pulse ox and blood pressure Approach: midline Location: L4-5 Injection technique: single-shot Needle Needle type: Pencan  Needle gauge: 24 G Needle length: 10 cm Assessment Events: CSF return Additional Notes Patient identified. Risks/Benefits/Options discussed with patient including but not limited to bleeding, infection, nerve damage, paralysis, failed block, incomplete pain control, headache, blood pressure changes, nausea, vomiting, reactions to medications, itching and postpartum back pain. Confirmed with bedside nurse the patient's most recent platelet count. Confirmed with patient that they are not currently taking any anticoagulation, have any bleeding history or any family history of bleeding disorders. Patient expressed understanding and wished to proceed. All questions were answered. Sterile technique was used throughout the entire procedure. Please see nursing notes for vital signs. Warning signs of high block given to the patient including shortness of breath, tingling/numbness in hands, complete motor block, or any concerning symptoms with instructions to call for help. Patient was given instructions on fall risk and not to get out of bed. All questions and concerns addressed with instructions to call with any issues or inadequate analgesia.

## 2021-12-01 NOTE — Transfer of Care (Signed)
Immediate Anesthesia Transfer of Care Note  Patient: Daniel [name]  Procedure(s) Performed: TOTAL HIP ARTHROPLASTY ANTERIOR APPROACH (Left: Hip)  Patient Location: PACU  Anesthesia Type:Spinal  Level of Consciousness: awake and responds to stimulation  Airway & Oxygen Therapy: Patient Spontanous Breathing and Patient connected to face mask oxygen  Post-op Assessment: Report given to RN and Post -op Vital signs reviewed and stable  Post vital signs: Reviewed and stable  Last Vitals:  Vitals Value Taken Time  BP 100/75 12/01/21 1150  Temp    Pulse 91 12/01/21 1151  Resp 19 12/01/21 1151  SpO2 100 % 12/01/21 1151  Vitals shown include unvalidated device data.  Last Pain:  Vitals:   12/01/21 0722  TempSrc:   PainSc: 8       Patients Stated Pain Goal: 5 (12/01/21 0722)  Complications: No notable events documented.

## 2021-12-01 NOTE — Progress Notes (Signed)
Pt's HR has been tachy up to 120's this evening.  PRN Oxycodone 15mg  and Robaxin given with a stated decreased in pain of 7/10 which is down from 10/10.  See VS below.  Left hip incision clean dry and intact with no visible drainage.  Pt's foley patent with amber colored urine but only documented urine since insertion.  Bladder scan done which showed no residual urine in bladder.  Pt still has NS at infusing.  Discussed with Dr. who is on call.  EKG done per order and shows ST.  Hospitalist consult also ordered.      12/01/21 2122 12/01/21 2144  Assess: MEWS Score  Temp 98.5 F (36.9 C)  --   BP (!) 124/95  --   MAP (mmHg) 103  --   Pulse Rate (!) 117  --   Resp 16  --   SpO2 99 %  --   O2 Device Nasal Cannula  --   O2 Flow Rate (L/min) 2 L/min  --   Assess: MEWS Score  MEWS Temp 0  --   MEWS Systolic 0  --   MEWS Pulse 2  --   MEWS RR 0  --   MEWS LOC 0  --   MEWS Score 2  --   MEWS Score Color Yellow  --   Assess: if the MEWS score is Yellow or Red  Were vital signs taken at a resting state? Yes  --   Focused Assessment No change from prior assessment  --   Does the patient meet 2 or more of the SIRS criteria? Yes  --   Does the patient have a confirmed or suspected source of infection? No  --   MEWS guidelines implemented *See Row Information* Yes  --   Treat  MEWS Interventions  --  Administered prn meds/treatments  Pain Scale  --  0-10  Pain Score  --  7  Pain Type  --  Surgical pain  Pain Location  --  Hip  Pain Orientation  --  Left  Pain Descriptors / Indicators  --  Aching  Pain Frequency  --  Constant  Pain Onset  --  On-going  Patients Stated Pain Goal  --  5  Pain Intervention(s)  --  Medication (See eMAR)  Take Vital Signs  Increase Vital Sign Frequency   --  Yellow: Q 2hr X 2 then Q 4hr X 2, if remains yellow, continue Q 4hrs  Escalate  MEWS: Escalate  --  Yellow: discuss with charge nurse/RN and consider discussing with provider and RRT   Notify: Provider  Provider Name/Title  --  [id number], D  Date Provider Notified  --  12/01/21  Time Provider Notified  --  2217  Method of Notification  --  Page  Notification Reason  --  Other (Comment) (Tachycardia, decreased UOP)  Provider response  --  See new orders  Date of Provider Response  --  12/01/21  Time of Provider Response  --  2217  Assess: SIRS CRITERIA  SIRS Temperature  0  --   SIRS Pulse 1  --   SIRS Respirations  0  --   SIRS WBC 1  --   SIRS Score Sum  2  --    2218 BSN RN [facility] 12/01/2021, 11:28 PM

## 2021-12-01 NOTE — Evaluation (Signed)
Physical Therapy Evaluation Patient Details Name: Daniel [name] MRN: [id number] DOB: 1970/12/21 Today's Date: 12/01/2021  History of Present Illness  Pt s/p L THR and with hx of Morbid obesity, asthma, and alcoholic hepatitis  Clinical Impression  Pt s/p  L THR and presents with decreased L LE strength/ROM and post op pain limiting functional mobility.  Pt should progress to dc home with family assist.     Recommendations for follow up therapy are one component of a multi-disciplinary discharge planning process, led by the attending physician.  Recommendations may be updated based on patient status, additional functional criteria and insurance authorization.  Follow Up Recommendations Follow physician's recommendations for discharge plan and follow up therapies      Assistance Recommended at Discharge Intermittent Supervision/Assistance  Patient can return home with the following  A little help with walking and/or transfers;A little help with bathing/dressing/bathroom;Assist for transportation;Help with stairs or ramp for entrance;Assistance with cooking/housework    Equipment Recommendations Rolling walker (2 wheels)  Recommendations for Other Services       Functional Status Assessment Patient has had a recent decline in their functional status and demonstrates the ability to make significant improvements in function in a reasonable and predictable amount of time.     Precautions / Restrictions Precautions Precautions: Fall Restrictions Weight Bearing Restrictions: No Other Position/Activity Restrictions: WBAT      Mobility  Bed Mobility               General bed mobility comments: Pt up in chair and requests back to same    Transfers Overall transfer level: Needs assistance   Transfers: Sit to/from Stand Sit to Stand: Min assist           General transfer comment: cues for LE management and use of UEs to self assist    Ambulation/Gait Ambulation/Gait  assistance: Min assist Gait Distance (Feet): 47 Feet Assistive device: Rolling walker (2 wheels) Gait Pattern/deviations: Step-to pattern, Decreased step length - right, Decreased step length - left, Shuffle, Trunk flexed Gait velocity: decr     General Gait Details: cues for posture, sequence and position from [employer]            Wheelchair Mobility    Modified Rankin (Stroke Patients Only)       Balance Overall balance assessment: Mild deficits observed, not formally tested                                           Pertinent Vitals/Pain Pain Assessment Pain Assessment: 0-10 Pain Score: 7  Pain Location: L hip Pain Descriptors / Indicators: Aching, Burning, Sore Pain Intervention(s): Limited activity within patient's tolerance, Monitored during session, Premedicated before session, Ice applied    Home Living Family/patient expects to be discharged to:: Private residence Living Arrangements: Spouse/significant other;Children Available Help at Discharge: Family;Available PRN/intermittently Type of Home: Apartment Home Access: Stairs to enter   Entrance Stairs-Number of Steps: 1   Home Layout: One level Home Equipment: Cane - single point      Prior Function Prior Level of Function : Independent/Modified Independent                     Hand Dominance        Extremity/Trunk Assessment   Upper Extremity Assessment Upper Extremity Assessment: Overall WFL for tasks assessed    Lower Extremity Assessment  Lower Extremity Assessment: LLE deficits/detail    Cervical / Trunk Assessment Cervical / Trunk Assessment: Normal  Communication   Communication: No difficulties  Cognition Arousal/Alertness: Awake/alert Behavior During Therapy: WFL for tasks assessed/performed Overall Cognitive Status: Within Functional Limits for tasks assessed                                          General Comments       Exercises Total Joint Exercises Ankle Circles/Pumps: AROM, Both, 15 reps, Supine   Assessment/Plan    PT Assessment Patient needs continued PT services  PT Problem List Decreased strength;Decreased range of motion;Decreased activity tolerance;Decreased balance;Decreased mobility;Decreased knowledge of use of DME;Pain;Obesity       PT Treatment Interventions DME instruction;Gait training;Stair training;Functional mobility training;Therapeutic activities;Therapeutic exercise;Balance training;Patient/family education    PT Goals (Current goals can be found in the Care Plan section)  Acute Rehab PT Goals Patient Stated Goal: Regain IND PT Goal Formulation: With patient Time For Goal Achievement: 12/15/21 Potential to Achieve Goals: Good    Frequency 7X/week     Co-evaluation               AM-PAC PT "6 Clicks" Mobility  Outcome Measure Help needed turning from your back to your side while in a flat bed without using bedrails?: A Lot Help needed moving from lying on your back to sitting on the side of a flat bed without using bedrails?: A Lot Help needed moving to and from a bed to a chair (including a wheelchair)?: A Little Help needed standing up from a chair using your arms (e.g., wheelchair or bedside chair)?: A Little Help needed to walk in hospital room?: A Little Help needed climbing 3-5 steps with a railing? : A Lot 6 Click Score: 15    End of Session Equipment Utilized During Treatment: Gait belt Activity Tolerance: Patient tolerated treatment well Patient left: in chair;with call bell/phone within reach;with chair alarm set;with family/visitor present Nurse Communication: Mobility status PT Visit Diagnosis: Difficulty in walking, not elsewhere classified (R26.2)    Time: 6606-3016 PT Time Calculation (min) (ACUTE ONLY): 24 min   Charges:   PT Evaluation $PT Eval Low Complexity: 1 Low PT Treatments $Gait Training: 8-22 mins        Mauro Kaufmann  PT Acute Rehabilitation Services Pager [phone] Office [phone]   Corie Allis 12/01/2021, 5:06 PM

## 2021-12-01 NOTE — Anesthesia Preprocedure Evaluation (Signed)
Anesthesia Evaluation  Patient identified by MRN, date of birth, ID band Patient awake    Reviewed: Allergy & Precautions, NPO status , Patient's Chart, lab work & pertinent test results  Airway Mallampati: II  TM Distance: >3 FB Neck ROM: Full    Dental no notable dental hx.    Pulmonary asthma , Current Smoker   Pulmonary exam normal        Cardiovascular hypertension, Pt. on medications and Pt. on home beta blockers  Rhythm:Regular Rate:Normal     Neuro/Psych   Anxiety     negative neurological ROS     GI/Hepatic negative GI ROS, Neg liver ROS,,,  Endo/Other  negative endocrine ROS    Renal/GU negative Renal ROS  negative genitourinary   Musculoskeletal  (+) Arthritis , Osteoarthritis,    Abdominal Normal abdominal exam  (+)   Peds  Hematology  (+) Blood dyscrasia, anemia   Anesthesia Other Findings   Reproductive/Obstetrics                             Anesthesia Physical Anesthesia Plan  ASA: 2  Anesthesia Plan: MAC and Spinal   Post-op Pain Management:    Induction: Intravenous  PONV Risk Score and Plan: Ondansetron, Dexamethasone, Propofol infusion, Midazolam and Treatment may vary due to age or medical condition  Airway Management Planned: Simple Face Mask, Natural Airway and Nasal Cannula  Additional Equipment: None  Intra-op Plan:   Post-operative Plan:   Informed Consent: I have reviewed the patients History and Physical, chart, labs and discussed the procedure including the risks, benefits and alternatives for the proposed anesthesia with the patient or authorized representative who has indicated his/her understanding and acceptance.     Dental advisory given  Plan Discussed with: CRNA  Anesthesia Plan Comments: (Lab Results      Component                Value               Date                      WBC                      10.1                11/22/2021                 HGB                      14.2                11/22/2021                HCT                      41.8                11/22/2021                MCV                      91.9                11/22/2021                PLT  299                 11/22/2021            Lab Results      Component                Value               Date                      NA                       138                 11/22/2021                K                        3.4 (L)             11/22/2021                CO2                      26                  11/22/2021                GLUCOSE                  107 (H)             11/22/2021                BUN                      11                  11/22/2021                CREATININE               0.76                11/22/2021                CALCIUM                  9.1                 11/22/2021                EGFR                     112                 07/06/2021                GFRNONAA                 >60                 11/22/2021           )       Anesthesia Quick Evaluation

## 2021-12-01 NOTE — Anesthesia Postprocedure Evaluation (Signed)
Anesthesia Post Note  Patient: Daniel [name]  Procedure(s) Performed: TOTAL HIP ARTHROPLASTY ANTERIOR APPROACH (Left: Hip)     Patient location during evaluation: PACU Anesthesia Type: MAC and Spinal Level of consciousness: awake and alert Pain management: pain level controlled Vital Signs Assessment: post-procedure vital signs reviewed and stable Respiratory status: spontaneous breathing, nonlabored ventilation, respiratory function stable and patient connected to nasal cannula oxygen Cardiovascular status: stable and blood pressure returned to baseline Postop Assessment: no apparent nausea or vomiting Anesthetic complications: no   No notable events documented.  Last Vitals:  Vitals:   12/01/21 1600 12/01/21 1658  BP: 105/73 107/82  Pulse: (!) 111 (!) 110  Resp:  17  Temp:  36.7 C  SpO2:  94%    Last Pain:  Vitals:   12/01/21 1658  TempSrc: Oral  PainSc:                  March Rummage Lucendia Leard

## 2021-12-01 NOTE — Interval H&P Note (Signed)
History and Physical Interval Note:  12/01/2021 7:44 AM  Daniel [name]  has presented today for surgery, with the diagnosis of Left hip osteoarthritis.  The various methods of treatment have been discussed with the patient and family. After consideration of risks, benefits and other options for treatment, the patient has consented to  Procedure(s): TOTAL HIP ARTHROPLASTY ANTERIOR APPROACH (Left) as a surgical intervention.  The patient's history has been reviewed, patient examined, no change in status, stable for surgery.  I have reviewed the patient's chart and labs.  Questions were answered to the patient's satisfaction.     [name]

## 2021-12-01 NOTE — Discharge Instructions (Addendum)
 Dr. Brian Swinteck Joint Replacement Specialist [facility] [address] [place], [place] [address] [phone]   TOTAL HIP REPLACEMENT POSTOPERATIVE DIRECTIONS    Hip Rehabilitation, Guidelines Following Surgery   WEIGHT BEARING Weight bearing as tolerated with assist device (walker, cane, etc) as directed, use it as long as suggested by your surgeon or therapist, typically at least 4-6 weeks.  The results of a hip operation are greatly improved after range of motion and muscle strengthening exercises. Follow all safety measures which are given to protect your hip. If any of these exercises cause increased pain or swelling in your joint, decrease the amount until you are comfortable again. Then slowly increase the exercises. Call your caregiver if you have problems or questions.   HOME CARE INSTRUCTIONS  Most of the following instructions are designed to prevent the dislocation of your new hip.  Remove items at home which could result in a fall. This includes throw rugs or furniture in walking pathways.  Continue medications as instructed at time of discharge. You may have some home medications which will be placed on hold until you complete the course of blood thinner medication. You may start showering once you are discharged home. Do not remove your dressing. Do not put on socks or shoes without following the instructions of your caregivers.   Sit on chairs with arms. Use the chair arms to help push yourself up when arising.  Arrange for the use of a toilet seat elevator so you are not sitting low.  Walk with walker as instructed.  You may resume a sexual relationship in one month or when given the OK by your caregiver.  Use walker as long as suggested by your caregivers.  You may put full weight on your legs and walk as much as is comfortable. Avoid periods of inactivity such as sitting longer than an hour when not asleep. This helps prevent blood  clots.  You may return to work once you are cleared by your surgeon.  Do not drive a car for 6 weeks or until released by your surgeon.  Do not drive while taking narcotics.  Wear elastic stockings for two weeks following surgery during the day but you may remove then at night.  Make sure you keep all of your appointments after your operation with all of your doctors and caregivers. You should call the office at the above phone number and make an appointment for approximately two weeks after the date of your surgery. Please pick up a stool softener and laxative for home use as long as you are requiring pain medications. ICE to the affected hip every three hours for 30 minutes at a time and then as needed for pain and swelling. Continue to use ice on the hip for pain and swelling from surgery. You may notice swelling that will progress down to the foot and ankle.  This is normal after surgery.  Elevate the leg when you are not up walking on it.   It is important for you to complete the blood thinner medication as prescribed by your doctor. Continue to use the breathing machine which will help keep your temperature down.  It is common for your temperature to cycle up and down following surgery, especially at night when you are not up moving around and exerting yourself.  The breathing machine keeps your lungs expanded and your temperature down.  RANGE OF MOTION AND STRENGTHENING EXERCISES  These exercises are designed to help you   keep full movement of your hip joint. Follow your caregiver's or physical therapist's instructions. Perform all exercises about fifteen times, three times per day or as directed. Exercise both hips, even if you have had only one joint replacement. These exercises can be done on a training (exercise) mat, on the floor, on a table or on a bed. Use whatever works the best and is most comfortable for you. Use music or television while you are exercising so that the exercises are a  pleasant break in your day. This will make your life better with the exercises acting as a break in routine you can look forward to.  Lying on your back, slowly slide your foot toward your buttocks, raising your knee up off the floor. Then slowly slide your foot back down until your leg is straight again.  Lying on your back spread your legs as far apart as you can without causing discomfort.  Lying on your side, raise your upper leg and foot straight up from the floor as far as is comfortable. Slowly lower the leg and repeat.  Lying on your back, tighten up the muscle in the front of your thigh (quadriceps muscles). You can do this by keeping your leg straight and trying to raise your heel off the floor. This helps strengthen the largest muscle supporting your knee.  Lying on your back, tighten up the muscles of your buttocks both with the legs straight and with the knee bent at a comfortable angle while keeping your heel on the floor.   SKILLED REHAB INSTRUCTIONS: If the patient is transferred to a skilled rehab facility following release from the hospital, a list of the current medications will be sent to the facility for the patient to continue.  When discharged from the skilled rehab facility, please have the facility set up the patient's Home Health Physical Therapy prior to being released. Also, the skilled facility will be responsible for providing the patient with their medications at time of release from the facility to include their pain medication and their blood thinner medication. If the patient is still at the rehab facility at time of the two week follow up appointment, the skilled rehab facility will also need to assist the patient in arranging follow up appointment in our office and any transportation needs.  POST-OPERATIVE OPIOID TAPER INSTRUCTIONS: It is important to wean off of your opioid medication as soon as possible. If you do not need pain medication after your surgery it is ok  to stop day one. Opioids include: Codeine, Hydrocodone(Norco, Vicodin), Oxycodone(Percocet, oxycontin) and hydromorphone amongst others.  Long term and even short term use of opiods can cause: Increased pain response Dependence Constipation Depression Respiratory depression And more.  Withdrawal symptoms can include Flu like symptoms Nausea, vomiting And more Techniques to manage these symptoms Hydrate well Eat regular healthy meals Stay active Use relaxation techniques(deep breathing, meditating, yoga) Do Not substitute Alcohol to help with tapering If you have been on opioids for less than two weeks and do not have pain than it is ok to stop all together.  Plan to wean off of opioids This plan should start within one week post op of your joint replacement. Maintain the same interval or time between taking each dose and first decrease the dose.  Cut the total daily intake of opioids by one tablet each day Next start to increase the time between doses. The last dose that should be eliminated is the evening dose.    MAKE   SURE YOU:  Understand these instructions.  Will watch your condition.  Will get help right away if you are not doing well or get worse.  Pick up stool softner and laxative for home use following surgery while on pain medications. Do not remove your dressing. The dressing is waterproof--it is OK to take showers. Continue to use ice for pain and swelling after surgery. Do not use any lotions or creams on the incision until instructed by your surgeon. Total Hip Protocol.   Information on my medicine - ELIQUIS (apixaban)  Why was Eliquis prescribed for you? Eliquis was prescribed for you to reduce the risk of blood clots forming after orthopedic surgery.    What do You need to know about Eliquis? Take your Eliquis TWICE DAILY - one tablet in the morning and one tablet in the evening with or without food.  It would be best to take the dose about the  same time each day.  If you have difficulty swallowing the tablet whole please discuss with your pharmacist how to take the medication safely.  Take Eliquis exactly as prescribed by your doctor and DO NOT stop taking Eliquis without talking to the doctor who prescribed the medication.  Stopping without other medication to take the place of Eliquis may increase your risk of developing a clot.  After discharge, you should have regular check-up appointments with your healthcare provider that is prescribing your Eliquis.  What do you do if you miss a dose? If a dose of ELIQUIS is not taken at the scheduled time, take it as soon as possible on the same day and twice-daily administration should be resumed.  The dose should not be doubled to make up for a missed dose.  Do not take more than one tablet of ELIQUIS at the same time.  Important Safety Information A possible side effect of Eliquis is bleeding. You should call your healthcare provider right away if you experience any of the following: Bleeding from an injury or your nose that does not stop. Unusual colored urine (red or dark brown) or unusual colored stools (red or black). Unusual bruising for unknown reasons. A serious fall or if you hit your head (even if there is no bleeding).  Some medicines may interact with Eliquis and might increase your risk of bleeding or clotting while on Eliquis. To help avoid this, consult your healthcare provider or pharmacist prior to using any new prescription or non-prescription medications, including herbals, vitamins, non-steroidal anti-inflammatory drugs (NSAIDs) and supplements.  This website has more information on Eliquis (apixaban): http://www.eliquis.com/eliquis/home   

## 2021-12-02 ENCOUNTER — Encounter (HOSPITAL_COMMUNITY): Payer: Self-pay | Admitting: Orthopedic Surgery

## 2021-12-02 ENCOUNTER — Observation Stay (HOSPITAL_COMMUNITY): Payer: BC Managed Care – PPO

## 2021-12-02 ENCOUNTER — Ambulatory Visit (HOSPITAL_COMMUNITY): Payer: BC Managed Care – PPO

## 2021-12-02 DIAGNOSIS — E876 Hypokalemia: Secondary | ICD-10-CM

## 2021-12-02 DIAGNOSIS — N179 Acute kidney failure, unspecified: Secondary | ICD-10-CM

## 2021-12-02 DIAGNOSIS — M1612 Unilateral primary osteoarthritis, left hip: Secondary | ICD-10-CM | POA: Diagnosis not present

## 2021-12-02 DIAGNOSIS — Z0389 Encounter for observation for other suspected diseases and conditions ruled out: Secondary | ICD-10-CM | POA: Diagnosis not present

## 2021-12-02 DIAGNOSIS — Z9889 Other specified postprocedural states: Secondary | ICD-10-CM

## 2021-12-02 DIAGNOSIS — R000 Tachycardia, unspecified: Secondary | ICD-10-CM | POA: Diagnosis not present

## 2021-12-02 LAB — URINALYSIS, ROUTINE W REFLEX MICROSCOPIC
Bilirubin Urine: NEGATIVE
Glucose, UA: NEGATIVE mg/dL
Ketones, ur: NEGATIVE mg/dL
Nitrite: NEGATIVE
Protein, ur: 100 mg/dL — AB
RBC / HPF: >50 RBC/hpf — ABNORMAL HIGH (ref 0–5)
Specific Gravity, Urine: 1.039 — ABNORMAL HIGH (ref 1.005–1.030)
pH: 5 (ref 5.0–8.0)

## 2021-12-02 LAB — PROTIME-INR
INR: 1.1 (ref 0.8–1.2)
Prothrombin Time: 14.3 seconds (ref 11.4–15.2)

## 2021-12-02 LAB — BASIC METABOLIC PANEL
Anion gap: 9 (ref 5–15)
Anion gap: 7 (ref 5–15)
BUN: 17 mg/dL (ref 6–20)
BUN: 17 mg/dL (ref 6–20)
CO2: 25 mmol/L (ref 22–32)
CO2: 26 mmol/L (ref 22–32)
Calcium: 8 mg/dL — ABNORMAL LOW (ref 8.9–10.3)
Calcium: 7.8 mg/dL — ABNORMAL LOW (ref 8.9–10.3)
Chloride: 100 mmol/L (ref 98–111)
Chloride: 101 mmol/L (ref 98–111)
Creatinine, Ser: 1.33 mg/dL — ABNORMAL HIGH (ref 0.61–1.24)
Creatinine, Ser: 1.34 mg/dL — ABNORMAL HIGH (ref 0.61–1.24)
GFR, Estimated: >60 mL/min (ref >60)
GFR, Estimated: >60 mL/min (ref >60)
Glucose, Bld: 132 mg/dL — ABNORMAL HIGH (ref 70–99)
Glucose, Bld: 121 mg/dL — ABNORMAL HIGH (ref 70–99)
Potassium: 3.2 mmol/L — ABNORMAL LOW (ref 3.5–5.1)
Potassium: 3.3 mmol/L — ABNORMAL LOW (ref 3.5–5.1)
Sodium: 134 mmol/L — ABNORMAL LOW (ref 135–145)
Sodium: 134 mmol/L — ABNORMAL LOW (ref 135–145)

## 2021-12-02 LAB — CBC
HCT: 35.5 % — ABNORMAL LOW (ref 39.0–52.0)
Hemoglobin: 11.9 g/dL — ABNORMAL LOW (ref 13.0–17.0)
MCH: 30.7 pg (ref 26.0–34.0)
MCHC: 33.5 g/dL (ref 30.0–36.0)
MCV: 91.5 fL (ref 80.0–100.0)
Platelets: 274 10*3/uL (ref 150–400)
RBC: 3.88 MIL/uL — ABNORMAL LOW (ref 4.22–5.81)
RDW: 15.2 % (ref 11.5–15.5)
WBC: 13.8 10*3/uL — ABNORMAL HIGH (ref 4.0–10.5)
nRBC: 0 % (ref 0.0–0.2)

## 2021-12-02 LAB — TROPONIN I (HIGH SENSITIVITY)
Troponin I (High Sensitivity): 8 ng/L (ref <18)
Troponin I (High Sensitivity): 8 ng/L (ref <18)

## 2021-12-02 LAB — MAGNESIUM
Magnesium: 1.3 mg/dL — ABNORMAL LOW (ref 1.7–2.4)
Magnesium: 1.2 mg/dL — ABNORMAL LOW (ref 1.7–2.4)

## 2021-12-02 MED ORDER — POLYETHYLENE GLYCOL 3350 17 G PO PACK: 17.0000 g | PACK | Freq: Every day | ORAL | 0 refills | Status: AC | PRN

## 2021-12-02 MED ORDER — POTASSIUM CHLORIDE CRYS ER 20 MEQ PO TBCR
40.0000 meq | EXTENDED_RELEASE_TABLET | Freq: Once | ORAL | Status: AC
  Administered 2021-12-02: 40 meq via ORAL
  Filled 2021-12-02: qty 2

## 2021-12-02 MED ORDER — SODIUM CHLORIDE 0.9 % IV SOLN
2.0000 g | Freq: Three times a day (TID) | INTRAVENOUS | Status: DC
  Administered 2021-12-02 (×2): 2 g via INTRAVENOUS
  Filled 2021-12-02 (×2): qty 12.5

## 2021-12-02 MED ORDER — HYDROMORPHONE HCL 2 MG PO TABS
1.0000 mg | ORAL_TABLET | ORAL | Status: DC | PRN
  Administered 2021-12-02 – 2021-12-03 (×2): 2 mg via ORAL
  Filled 2021-12-02 (×2): qty 1

## 2021-12-02 MED ORDER — LACTATED RINGERS IV SOLN: INTRAVENOUS | Status: DC

## 2021-12-02 MED ORDER — OXYCODONE HCL 5 MG PO TABS: 5.0000 mg | ORAL_TABLET | ORAL | 0 refills | Status: DC | PRN

## 2021-12-02 MED ORDER — TECHNETIUM TO 99M ALBUMIN AGGREGATED
4.0400 | Freq: Once | INTRAVENOUS | Status: AC | PRN
  Administered 2021-12-02: 4.04 via INTRAVENOUS

## 2021-12-02 MED ORDER — APIXABAN 2.5 MG PO TABS: 2.5000 mg | ORAL_TABLET | Freq: Two times a day (BID) | ORAL | 0 refills | Status: DC

## 2021-12-02 MED ORDER — MAGNESIUM SULFATE 4 GM/100ML IV SOLN
4.0000 g | Freq: Once | INTRAVENOUS | Status: AC
  Administered 2021-12-02: 4 g via INTRAVENOUS
  Filled 2021-12-02: qty 100

## 2021-12-02 MED ORDER — MAGNESIUM SULFATE 2 GM/50ML IV SOLN
2.0000 g | Freq: Once | INTRAVENOUS | Status: AC
  Administered 2021-12-02: 2 g via INTRAVENOUS
  Filled 2021-12-02: qty 50

## 2021-12-02 MED ORDER — SODIUM CHLORIDE 0.9 % IV SOLN
1.0000 g | INTRAVENOUS | Status: DC
  Administered 2021-12-02 – 2021-12-03 (×2): 1 g via INTRAVENOUS
  Filled 2021-12-02 (×2): qty 10

## 2021-12-02 MED ORDER — DOCUSATE SODIUM 100 MG PO CAPS: 100.0000 mg | ORAL_CAPSULE | Freq: Two times a day (BID) | ORAL | 0 refills | Status: AC

## 2021-12-02 MED ORDER — ONDANSETRON HCL 4 MG PO TABS: 4.0000 mg | ORAL_TABLET | Freq: Three times a day (TID) | ORAL | 0 refills | Status: DC | PRN

## 2021-12-02 MED ORDER — METHOCARBAMOL 500 MG PO TABS: 500.0000 mg | ORAL_TABLET | Freq: Four times a day (QID) | ORAL | 0 refills | Status: DC | PRN

## 2021-12-02 MED ORDER — SENNA 8.6 MG PO TABS: 2.0000 | ORAL_TABLET | Freq: Every day | ORAL | 0 refills | Status: AC

## 2021-12-02 NOTE — Progress Notes (Signed)
Pt with poor UOP overnight.  Developing mild AKI it appears.  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  Already scheduled to start eliquis for VTE prevention this AM.

## 2021-12-02 NOTE — Progress Notes (Signed)
PROGRESS NOTE    Daniel [name]  [id number] DOB: 1970/04/05 DOA: 12/01/2021 PCP: Charlott Rakes, MD   Brief Narrative:  This is 51 year old gentleman who was admitted for left THA by orthopedics but postoperatively, he had some tachycardia for which hospital service were consulted.  Assessment & Plan:   Principal Problem:   Osteoarthritis of left hip Active Problems:   Tachycardia  Left hip osteoarthritis: Status post THA.  Management per primary service/orthopedics.  Tachycardia/?  UTI: Patient did have tachycardia yesterday which has improved.  Patient has no complaints at the moment.  Denies chest pain, abdominal pain, shortness of breath, palpitation.  Heart rate around 107 today.  UA consistent with UTI.  Started on cefepime which I will continue and follow urine culture.  Also due to history of thromboembolism in the past, VQ scan is pending.  AKI: Mild bump in creatinine.  Rechecking labs.  Continue IV fluids.  Hypokalemia: Was replaced.  Rechecking today.  Hypomagnesemia: Was replaced, rechecking today.  Essential hypertension: Fairly controlled.  Continue home medications.  DVT prophylaxis: apixaban (ELIQUIS) tablet 2.5 mg Start: 12/02/21 0800 SCDs Start: 12/01/21 1457   Code Status: Full Code  Family Communication:  None present at bedside.  Plan of care discussed with patient in length and he/she verbalized understanding and agreed with it.  Estimated body mass index is 39.53 kg/m as calculated from the following:   Height as of this encounter: 5\' 8"  (1.727 m).   Weight as of this encounter: 117.9 kg.    Nutritional Assessment: Body mass index is 39.53 kg/m.Marland Kitchen Seen by dietician.  I agree with the assessment and plan as outlined below: Nutrition Status:        . Skin Assessment: I have examined the patient's skin and I agree with the wound assessment as performed by the wound care RN as outlined below:    Consultants:  TRH  Procedures:   THA  Antimicrobials:  Anti-infectives (From admission, onward)    Start     Dose/Rate Route Frequency Ordered Stop   12/02/21 0400  ceFEPIme (MAXIPIME) 2 g in sodium chloride 0.9 % 100 mL IVPB        2 g 200 mL/hr over 30 Minutes Intravenous Every 8 hours 12/02/21 0235     12/01/21 1600  ceFAZolin (ANCEF) IVPB 2g/100 mL premix        2 g 200 mL/hr over 30 Minutes Intravenous Every 6 hours 12/01/21 1457 12/01/21 2218   12/01/21 0700  ceFAZolin (ANCEF) IVPB 2g/100 mL premix        2 g 200 mL/hr over 30 Minutes Intravenous On call to O.R. 12/01/21 [id number] 12/01/21 0957         Subjective: Patient seen and examined.  Complains of hip pain.  Other than that no complaint.  Objective: Vitals:   12/01/21 2122 12/01/21 2335 12/02/21 0139 12/02/21 0519  BP: (!) 124/95 (!) 141/92 (!) 136/91 (!) 127/108  Pulse: (!) 117 (!) 113 (!) 116 (!) 109  Resp: 16 20 20 18   Temp: 98.5 F (36.9 C) 99.6 F (37.6 C) 98.4 F (36.9 C) 98.1 F (36.7 C)  TempSrc: Oral Oral    SpO2: 99% 96% 98% 93%  Weight:      Height:        Intake/Output Summary (Last 24 hours) at 12/02/2021 0954 Last data filed at 12/02/2021 0900 Gross per 24 hour  Intake 5051.69 ml  Output 745 ml  Net 4306.69 ml   [employer]  12/01/21 0722  Weight: 117.9 kg    Examination:  General exam: Appears calm and comfortable  Respiratory system: Clear to auscultation. Respiratory effort normal. Cardiovascular system: S1 & S2 heard, sinus tachycardia. No JVD, murmurs, rubs, gallops or clicks. No pedal edema. Gastrointestinal system: Abdomen is nondistended, soft and nontender. No organomegaly or masses felt. Normal bowel sounds heard. Central nervous system: Alert and oriented. No focal neurological deficits. Extremities: Symmetric 5 x 5 power. Skin: No rashes, lesions or ulcers Psychiatry: Judgement and insight appear normal. Mood & affect appropriate.    Data Reviewed: I have personally reviewed following labs and  imaging studies  CBC: Recent Labs  Lab 12/02/21 0056  WBC 13.8*  HGB 11.9*  HCT 35.5*  MCV 91.5  PLT 274   Basic Metabolic Panel: Recent Labs  Lab 12/02/21 0056  NA 134*  K 3.2*  CL 100  CO2 25  GLUCOSE 132*  BUN 17  CREATININE 1.33*  CALCIUM 8.0*  MG 1.3*   GFR: Estimated Creatinine Clearance: 83.8 mL/min (A) (by C-G formula based on SCr of 1.33 mg/dL (H)). Liver Function Tests: No results for input(s): "AST", "ALT", "ALKPHOS", "BILITOT", "PROT", "ALBUMIN" in the last 168 hours. No results for input(s): "LIPASE", "AMYLASE" in the last 168 hours. No results for input(s): "AMMONIA" in the last 168 hours. Coagulation Profile: Recent Labs  Lab 12/02/21 0056  INR 1.1   Cardiac Enzymes: No results for input(s): "CKTOTAL", "CKMB", "CKMBINDEX", "TROPONINI" in the last 168 hours. BNP (last 3 results) No results for input(s): "PROBNP" in the last 8760 hours. HbA1C: No results for input(s): "HGBA1C" in the last 72 hours. CBG: Recent Labs  Lab 12/01/21 1153  GLUCAP 119*   Lipid Profile: No results for input(s): "CHOL", "HDL", "LDLCALC", "TRIG", "CHOLHDL", "LDLDIRECT" in the last 72 hours. Thyroid Function Tests: No results for input(s): "TSH", "T4TOTAL", "FREET4", "T3FREE", "THYROIDAB" in the last 72 hours. Anemia Panel: No results for input(s): "VITAMINB12", "FOLATE", "FERRITIN", "TIBC", "IRON", "RETICCTPCT" in the last 72 hours. Sepsis Labs: No results for input(s): "PROCALCITON", "LATICACIDVEN" in the last 168 hours.  No results found for this or any previous visit (from the past 240 hour(s)).   Radiology Studies: DG CHEST PORT 1 VIEW  Result Date: 12/02/2021 CLINICAL DATA:  Tachycardia. EXAM: PORTABLE CHEST 1 VIEW COMPARISON:  03/01/2018. FINDINGS: The heart size and mediastinal contours are within normal limits. No consolidation, effusion, or pneumothorax. No acute osseous abnormality. IMPRESSION: No active disease. Electronically Signed   By: Thornell Sartorius  M.D.   On: 12/02/2021 00:59   DG Pelvis Portable  Result Date: 12/01/2021 CLINICAL DATA:  Status post left hip arthroplasty EXAM: PORTABLE PELVIS 1-2 VIEWS COMPARISON:  10/21/2019 FINDINGS: There is interval left hip arthroplasty. There are pockets of air in the soft tissues from recent surgery. Severe degenerative changes are noted in right hip with interval progression. IMPRESSION: Status post left hip arthroplasty. Severe degenerative changes are noted in right hip. Electronically Signed   By: Ernie Avena M.D.   On: 12/01/2021 12:24   DG HIP UNILAT WITH PELVIS 1V LEFT  Result Date: 12/01/2021 CLINICAL DATA:  Left hip replacement. EXAM: DG HIP (WITH OR WITHOUT PELVIS) 1V*L*; DG C-ARM 1-60 MIN-NO REPORT Radiation exposure index: 2.4016 mGy. COMPARISON:  None Available. FINDINGS: Two intraoperative fluoroscopic images were obtained of the left hip. The left femoral and acetabular components are well situated. IMPRESSION: Fluoroscopic guidance provided during left total hip arthroplasty. Electronically Signed   By: Zenda Alpers.D.  On: 12/01/2021 11:34   DG C-Arm 1-60 Min-No Report  Result Date: 12/01/2021 Fluoroscopy was utilized by the requesting physician.  No radiographic interpretation.   DG C-Arm 1-60 Min-No Report  Result Date: 12/01/2021 Fluoroscopy was utilized by the requesting physician.  No radiographic interpretation.    Scheduled Meds:  allopurinol  300 mg Oral BID   amLODipine  5 mg Oral Daily   apixaban  2.5 mg Oral Q12H   carvedilol  6.25 mg Oral BID WC   docusate sodium  100 mg Oral BID   senna  1 tablet Oral BID   Continuous Infusions:  ceFEPime (MAXIPIME) IV Stopped (12/02/21 0315)   lactated ringers 125 mL/hr at 12/02/21 0600   methocarbamol (ROBAXIN) IV Stopped (12/01/21 1416)     LOS: 0 days   Darliss Cheney, MD [facility]  12/02/2021, 9:54 AM   *Please note that this is a verbal dictation therefore any spelling or grammatical errors  are due to the "[employer]" system interpretation.  Please page via [place] and do not message via secure chat for urgent patient care matters. Secure chat can be used for non urgent patient care matters.  How to contact the [facility] Attending or Consulting provider [place] or covering provider during after hours [place], for this patient?  Check the care team in [facility] and look for a) attending/consulting [facility] provider listed and b) the [facility] team listed. Page or secure chat [phone]. Log into [email or web address] and use [place]'s universal password to access. If you do not have the password, please contact the hospital operator. Locate the [facility] provider you are looking for under [facility] and page to a number that you can be directly reached. If you still have difficulty reaching the provider, please page the [facility] (Director on Call) for the Hospitalists listed on amion for assistance.

## 2021-12-02 NOTE — Progress Notes (Signed)
Per Dr. Julian Reil, will keep foley in this AM to monitor decreased UOP. Hilton Sinclair BSN RN CMSRN 12/02/2021, 4:22 AM

## 2021-12-02 NOTE — Assessment & Plan Note (Addendum)
POD #0 of L THA. Ortho is primary.

## 2021-12-02 NOTE — Progress Notes (Addendum)
Lab work reviewed thus far.  Urinalysis    Component Value Date/Time   COLORURINE AMBER (A) 12/02/2021 0053   APPEARANCEUR CLOUDY (A) 12/02/2021 0053   LABSPEC 1.039 (H) 12/02/2021 0053   PHURINE 5.0 12/02/2021 0053   GLUCOSEU NEGATIVE 12/02/2021 0053   HGBUR MODERATE (A) 12/02/2021 0053   BILIRUBINUR NEGATIVE 12/02/2021 0053   KETONESUR NEGATIVE 12/02/2021 0053   PROTEINUR 100 (A) 12/02/2021 0053   UROBILINOGEN 0.2 08/13/2019 1114   NITRITE NEGATIVE 12/02/2021 0053   LEUKOCYTESUR MODERATE (A) 12/02/2021 0053    CBC    Component Value Date/Time   WBC 13.8 (H) 12/02/2021 0056   RBC 3.88 (L) 12/02/2021 0056   HGB 11.9 (L) 12/02/2021 0056   HGB 15.0 05/28/2019 1622   HCT 35.5 (L) 12/02/2021 0056   HCT 43.2 05/28/2019 1622   PLT 274 12/02/2021 0056   PLT 306 05/28/2019 1622   MCV 91.5 12/02/2021 0056   MCV 96 05/28/2019 1622   MCH 30.7 12/02/2021 0056   MCHC 33.5 12/02/2021 0056   RDW 15.2 12/02/2021 0056   RDW 15.5 (H) 05/28/2019 1622   LYMPHSABS 1.8 05/28/2019 1622   MONOABS 0.9 11/24/2018 1242   EOSABS 0.1 05/28/2019 1622   BASOSABS 0.1 05/28/2019 1622   CMP     Component Value Date/Time   NA 134 (L) 12/02/2021 0056   NA 140 07/06/2021 1150   K 3.2 (L) 12/02/2021 0056   CL 100 12/02/2021 0056   CO2 25 12/02/2021 0056   GLUCOSE 132 (H) 12/02/2021 0056   BUN 17 12/02/2021 0056   BUN 10 07/06/2021 1150   CREATININE 1.33 (H) 12/02/2021 0056   CREATININE 0.84 04/26/2021 1117   CALCIUM 8.0 (L) 12/02/2021 0056   PROT 7.7 11/22/2021 0938   PROT 7.3 03/08/2020 1548   ALBUMIN 3.8 11/22/2021 0938   ALBUMIN 4.4 03/08/2020 1548   AST 17 11/22/2021 0938   ALT 11 11/22/2021 0938   ALKPHOS 59 11/22/2021 0938   BILITOT 0.7 11/22/2021 0938   BILITOT 0.4 03/08/2020 1548   GFRNONAA >60 12/02/2021 0056   GFRAA 122 03/08/2020 1548   CXR neg  Trop neg  INR 1.1  ? UTI -> UA definitely suspicious for this with mod LE, 11-21 WBC.  Empiric cefepime for presumed health  care associated UTI Urine culture ordered Replacing Mg and K, switching IVF to LR

## 2021-12-02 NOTE — Consult Note (Signed)
Initial Consultation Note   Patient: Daniel [name] WYO:[id number] DOB: 07-01-1970 PCP: Hoy Register, MD DOA: 12/01/2021 DOS: the patient was seen and examined on 12/02/2021 Primary service: Samson Frederic, MD  Referring physician: Dr. Odis Hollingshead Reason for consult: Post op tachycardia  Assessment/Plan: Assessment and Plan: * Osteoarthritis of left hip POD #0 of L THA. Ortho is primary.  Tachycardia Post op sinus tachycardia.  Persistent despite pt being nearly 3L net positive today since admission. DDx broad and includes but not limited to (in no particular order): 1) greater than expected blood loss / post op hemorrhage 2) early post op infection / sepsis (no fever, no drainage from wound, no erythema) 3) EtOH withdrawal (I see a history of EtOH abuse in his chart, but tells [place] last drink was 4 years ago now) 4) uncontrolled pain 5) pulmonary embolus (PMH of PE, on chronic eliquis, this would be unusually fast for PE post op though as he was tachycardic essentially on coming back from the OR per report).  No CP, no fever, no hypotension. No ischemic EKG findings. Plan: 1) CBC 2) CMP 3) Mg 4) trop 5) tele monitor 6) INR 7) CXR 8) UA       TRH will continue to follow the patient.  HPI: Daniel [name] is a 51 y.o. male with past medical history of HTN, prior EtOH abuse, obesity, prior PE in Feb 2020.  Pt with severe osteoarthritis of L hip.  Underwent L THA earlier today.  Pt with 150cc EBL.  Pt with S.Tach that has been persistent since immediately post op despite narcotics for pain control and large volume (looks like nearly 3L net positive) IVF.  Pt with pain in hip.  Denies SOB or CP.  Review of Systems: As mentioned in the history of present illness. All other systems reviewed and are negative. Past Medical History:  Diagnosis Date   Anxiety    Arthritis    Asthma    Hypertension    Pre-diabetes    Past Surgical History:  Procedure Laterality  Date   NO PAST SURGERIES     WISDOM TOOTH EXTRACTION     Social History:  reports that he has been smoking cigarettes. He has been smoking an average of .3 packs per day. He has never used smokeless tobacco. He reports that he does not currently use alcohol. He reports that he does not currently use drugs.  No Known Allergies  Family History  Problem Relation Age of Onset   Heart attack Mother     Prior to Admission medications   Medication Sig Start Date End Date Taking? Authorizing Provider  acetaminophen (TYLENOL) 500 MG tablet Take 1,000 mg by mouth every 6 (six) hours as needed for mild pain or moderate pain.   Yes [provider]  allopurinol (ZYLOPRIM) 300 MG tablet TAKE 1 TABLET BY MOUTH 2 TIMES DAILY. 09/05/21  Yes Newlin, Odette Horns, MD  amLODipine (NORVASC) 5 MG tablet TAKE 1 TABLET BY MOUTH EVERY DAY 11/08/21  Yes Hoy Register, MD  carvedilol (COREG) 6.25 MG tablet Take 1 tablet (6.25 mg total) by mouth 2 (two) times daily with a meal. 07/06/21  Yes Newlin, Enobong, MD  celecoxib (CELEBREX) 200 MG capsule Take 200 mg by mouth daily.   Yes [provider]  hydrochlorothiazide (HYDRODIURIL) 25 MG tablet TAKE 1 TABLET (25 MG TOTAL) BY MOUTH DAILY. 08/15/21  Yes Hoy Register, MD  Oxycodone HCl 10 MG TABS Take 10 mg by mouth 3 (three) times daily  as needed (pain).   Yes [provider]  Semaglutide,0.25 or 0.5MG /DOS, (OZEMPIC, 0.25 OR 0.5 MG/DOSE,) 2 MG/3ML SOPN Inject 0.5 mg into the skin once a week. 11/07/21  Yes Newlin, Odette Horns, MD  albuterol (VENTOLIN HFA) 108 (90 Base) MCG/ACT inhaler Inhale 1 puff into the lungs every 6 (six) hours as needed for wheezing or shortness of breath. 07/06/20   Hoy Register, MD  buPROPion (WELLBUTRIN XL) 150 MG 24 hr tablet Take 1 tablet (150 mg total) by mouth daily. For smoking cessation Patient not taking: Reported on 11/16/2021 10/03/21   Hoy Register, MD  clobetasol cream (TEMOVATE) 0.05 % Apply topically 2 (two)  times daily. Patient not taking: Reported on 11/16/2021 04/26/21   Rutha Bouchard T, MD  colchicine 0.6 MG tablet Take 2 tabs (1.2mg ) at the onset of a Gout flare, may repeat 1 tab (0.6mg ) after 2 hours if symptoms persist 09/30/19 04/12/20  Hoy Register, MD    Physical Exam: Vitals:   12/01/21 1658 12/01/21 1844 12/01/21 2122 12/01/21 2335  BP: 107/82 121/83 (!) 124/95 (!) 141/92  Pulse: (!) 110 (!) 110 (!) 117 (!) 113  Resp: 17 17 16 20   Temp: 98.1 F (36.7 C) 98 F (36.7 C) 98.5 F (36.9 C) 99.6 F (37.6 C)  TempSrc: Oral Oral Oral Oral  SpO2: 94% 96% 99% 96%  Weight:      Height:       Constitutional: NAD, calm, comfortable Eyes: PERRL, lids and conjunctivae normal ENMT: Mucous membranes are moist. Posterior pharynx clear of any exudate or lesions.Normal dentition.  Neck: normal, supple, no masses, no thyromegaly Respiratory: clear to auscultation bilaterally, no wheezing, no crackles. Normal respiratory effort. No accessory muscle use.  Cardiovascular: Tachycardic Abdomen: no tenderness, no masses palpated. No hepatosplenomegaly. Bowel sounds positive.  Musculoskeletal: no clubbing / cyanosis. No joint deformity upper and lower extremities. Good ROM, no contractures. Normal muscle tone.  Skin: no rashes, lesions, ulcers. No induration Neurologic: CN 2-12 grossly intact. Sensation intact, DTR normal. Strength 5/5 in all 4.  Psychiatric: Normal judgment and insight. Alert and oriented x 3. Normal mood.   Data Reviewed:   Results are pending, will review when available.    Family Communication: No family in room Primary team communication: Spoke with Dr. Thank you very much for involving Odis Hollingshead in the care of your patient.  Author: [place]., DO 12/02/2021 12:35 AM  For on call review [email or web address].

## 2021-12-02 NOTE — Progress Notes (Signed)
Physical Therapy Treatment Patient Details Name: Daniel [name] MRN: [id number] DOB: [birth year] Today's Date: 12/02/2021   History of Present Illness Pt s/p L THR and with hx of Morbid obesity, asthma, and alcoholic hepatitis    PT Comments    POD # 1 am session withheld due to tests to r/o PE POD # 1 pm session was limited Assisted OOB to amb only to the doorway due to pain. Avg RA was 96 and HR 79.  Hip pain increased to 9/10.  Reported to RN.    Recommendations for follow up therapy are one component of a multi-disciplinary discharge planning process, led by the attending physician.  Recommendations may be updated based on patient status, additional functional criteria and insurance authorization.  Follow Up Recommendations  Follow physician's recommendations for discharge plan and follow up therapies     Assistance Recommended at Discharge Intermittent Supervision/Assistance  Patient can return home with the following A little help with walking and/or transfers;A little help with bathing/dressing/bathroom;Assist for transportation;Help with stairs or ramp for entrance;Assistance with cooking/housework   Equipment Recommendations  Rolling walker (2 wheels)    Recommendations for Other Services       Precautions / Restrictions Precautions Precautions: Fall Restrictions Weight Bearing Restrictions: No Other Position/Activity Restrictions: WBAT     Mobility  Bed Mobility Overal bed mobility: Needs Assistance Bed Mobility: Supine to Sit           General bed mobility comments: demonstarted and instructed on how to use a belt to self assist LE off bed    Transfers Overall transfer level: Needs assistance Equipment used: Rolling walker (2 wheels) Transfers: Sit to/from Stand Sit to Stand: Min guard           General transfer comment: pt uses forward momentum from elevated bed to rise with 25% VC's on proper tech    Ambulation/Gait Ambulation/Gait  assistance: Min guard, Min assist Gait Distance (Feet): 5 Feet Assistive device: Rolling walker (2 wheels) Gait Pattern/deviations: Step-to pattern, Decreased step length - right, Decreased step length - left, Shuffle, Trunk flexed Gait velocity: decr     General Gait Details: 25% VC's on proper walker to self placement.  Decreased amb distance due to pain.   Stairs             Wheelchair Mobility    Modified Rankin (Stroke Patients Only)       Balance                                            Cognition Arousal/Alertness: Awake/alert Behavior During Therapy: WFL for tasks assessed/performed Overall Cognitive Status: Within Functional Limits for tasks assessed                                 General Comments: AxO x 3 pleasant        Exercises      General Comments        Pertinent Vitals/Pain Pain Assessment Pain Assessment: 0-10 Pain Score: 8  Pain Location: L hip Pain Descriptors / Indicators: Aching, Burning, Sore Pain Intervention(s): Monitored during session, Repositioned, Premedicated before session    Home Living                          Prior Function  PT Goals (current goals can now be found in the care plan section) Progress towards PT goals: Progressing toward goals    Frequency    7X/week      PT Plan Current plan remains appropriate    Co-evaluation              AM-PAC PT "6 Clicks" Mobility   Outcome Measure  Help needed turning from your back to your side while in a flat bed without using bedrails?: A Little Help needed moving from lying on your back to sitting on the side of a flat bed without using bedrails?: A Little Help needed moving to and from a bed to a chair (including a wheelchair)?: A Little Help needed standing up from a chair using your arms (e.g., wheelchair or bedside chair)?: A Little Help needed to walk in hospital room?: A Little Help needed  climbing 3-5 steps with a railing? : A Little 6 Click Score: 18    End of Session Equipment Utilized During Treatment: Gait belt Activity Tolerance: Patient limited by pain Patient left: in chair;with call bell/phone within reach Nurse Communication: Mobility status PT Visit Diagnosis: Difficulty in walking, not elsewhere classified (R26.2)     Time: 9407-6808 PT Time Calculation (min) (ACUTE ONLY): 25 min  Charges:  $Gait Training: 8-22 mins $Therapeutic Activity: 8-22 mins                     Felecia Shelling  PTA Acute  Rehabilitation Services Office M-F          [phone] Weekend pager [phone]

## 2021-12-02 NOTE — Progress Notes (Signed)
Pharmacy Antibiotic Note  Daniel [name] is a 51 y.o. male admitted on 12/01/2021 with UTI.  Pharmacy has been consulted for Cefepime dosing.  Plan: Cefepime 2gm IV q8h No dose adjustments anticipated.  Pharmacy will sign off and monitor peripherally via electronic surveillance software for any changes in renal function or micro data.   Height: 5\' 8"  (172.7 cm) Weight: 117.9 kg (260 lb) IBW/kg (Calculated) : 68.4  Temp (24hrs), Avg:98.2 F (36.8 C), Min:97.6 F (36.4 C), Max:99.6 F (37.6 C)  Recent Labs  Lab 12/02/21 0056  WBC 13.8*  CREATININE 1.33*    Estimated Creatinine Clearance: 83.8 mL/min (A) (by C-G formula based on SCr of 1.33 mg/dL (H)).    No Known Allergies   Thank you for allowing pharmacy to be a part of this patient's care.  13/10/23 PharmD 12/02/2021 2:33 AM

## 2021-12-02 NOTE — TOC Transition Note (Signed)
Transition of Care (TOC) - CM/SW Discharge Note   Patient Details  Name: Daniel [name] MRN: [id number] Date of Birth: 10/25/1970  Transition of Care (TOC) CM/SW Contact:  , , LCSW Phone Number: 12/02/2021, 2:55 PM   Clinical Narrative:    Met with pt and confirming he has received RW via [employer] to room.  Plan for HEP.  No further TOC needs.   Final next level of care: Home/Self Care Barriers to Discharge: No Barriers Identified   Patient Goals and CMS Choice Patient states their goals for this hospitalization and ongoing recovery are:: return home      Discharge Placement                       Discharge Plan and Services                DME Arranged: Walker rolling DME Agency: [employer]                  Social Determinants of Health (SDOH) Interventions     Readmission Risk Interventions     No data to display             

## 2021-12-02 NOTE — Plan of Care (Signed)
  Problem: Education: Goal: Knowledge of the prescribed therapeutic regimen will improve Outcome: Progressing   Problem: Activity: Goal: Ability to avoid complications of mobility impairment will improve Outcome: Progressing   Problem: Pain Management: Goal: Pain level will decrease with appropriate interventions Outcome: Progressing   

## 2021-12-02 NOTE — Assessment & Plan Note (Addendum)
Post op sinus tachycardia.  Persistent despite pt being nearly 3L net positive today since admission. DDx broad and includes but not limited to (in no particular order): 1) greater than expected blood loss / post op hemorrhage 2) early post op infection / sepsis (no fever, no drainage from wound, no erythema) 3) EtOH withdrawal (I see a history of EtOH abuse in his chart, but tells [place] last drink was 4 years ago now) 4) uncontrolled pain 5) pulmonary embolus (PMH of PE, on chronic eliquis, this would be unusually fast for PE post op though as he was tachycardic essentially on coming back from the OR per report).  No CP, no fever, no hypotension. No ischemic EKG findings. Plan: 1) CBC 2) CMP 3) Mg 4) trop 5) tele monitor 6) INR 7) CXR 8) UA

## 2021-12-02 NOTE — Progress Notes (Signed)
    Subjective:  Patient reports pain as moderate.  Denies N/V/CP/SOB/Abd pain. Patient reports difficulty with pain control overnight. Most of his pain is coming from his back vs hip, with some tingling down his left leg. Not much pain with hip movement he reports it mainly feels heavy.   Objective:   VITALS:   Vitals:   12/02/21 0139 12/02/21 0519 12/02/21 1054 12/02/21 1258  BP: (!) 136/91 (!) 127/108 128/75 (!) 142/98  Pulse: (!) 116 (!) 109 (!) 109 (!) 106  Resp: _0 Temp: 98.4 F (36.9 C) 98.1 F (36.7 C) 97.6 F (36.4 C) 99 F (37.2 C)  TempSrc:      SpO2: 98% 93% 99% 99%  Weight:      Height:        Patient sitting up in bed. NAD.  Neurologically intact ABD soft Neurovascular intact Sensation intact distally Intact pulses distally Dorsiflexion/Plantar flexion intact Incision: dressing C/D/I No cellulitis present Compartment soft  Sinus tachycardia with O2 at 95-98% ion 1L O2. HR 115.    Lab Results  Component Value Date   WBC 13.8 (H) 12/02/2021   HGB 11.9 (L) 12/02/2021   HCT 35.5 (L) 12/02/2021   MCV 91.5 12/02/2021   PLT 274 12/02/2021   BMET    Component Value Date/Time   NA 134 (L) 12/02/2021 0336   NA 140 07/06/2021 1150   K 3.3 (L) 12/02/2021 0336   CL 101 12/02/2021 0336   CO2 26 12/02/2021 0336   GLUCOSE 121 (H) 12/02/2021 0336   BUN 17 12/02/2021 0336   BUN 10 07/06/2021 1150   CREATININE 1.34 (H) 12/02/2021 0336   CREATININE 0.84 04/26/2021 1117   CALCIUM 7.8 (L) 12/02/2021 0336   EGFR 112 07/06/2021 1150   GFRNONAA >60 12/02/2021 0336     Assessment/Plan: 1 Day Post-Op   Principal Problem:   Osteoarthritis of left hip Active Problems:   Tachycardia  Sinus tachycardia overnight: Hospitalist team consulted, appreciate their help with management. Per their notes.  - UA: consistent with UTI. Patient was started on cefepime. Urine culture pending.  - K+ 3.2, Mg 1.3. Replacing with LR.  - VQ scan ordered to r/o PE due  to Miranda.    - Cr: 1.33 (0.76 11/22/21). Discontinued celebrex. Continue management per [facility] team.  - ABLA: Hemoglobin 11.9. Continue to monitor.   WBAT with walker DVT ppx:  Eliquis 2.35m BID , SCDs, TEDS PO pain control: Reports poor pain control overnight changed mediation to hydromorphone PO.   PT/OT: Patient ambulated 47 feet with PT yesterday. Continue PT.  Dispo: Plan for discharge Saturday vs Sunday pending PT, pain control, and medical clearance.    ACharlott Rakes PA-C 12/02/2021, 1:42 PM   [facility]  [place] [address], [address], [place] [address]Phone: [phone] Facebook  [employer]

## 2021-12-03 DIAGNOSIS — F1010 Alcohol abuse, uncomplicated: Secondary | ICD-10-CM | POA: Diagnosis present

## 2021-12-03 DIAGNOSIS — F419 Anxiety disorder, unspecified: Secondary | ICD-10-CM | POA: Diagnosis present

## 2021-12-03 DIAGNOSIS — R7303 Prediabetes: Secondary | ICD-10-CM | POA: Diagnosis present

## 2021-12-03 DIAGNOSIS — E876 Hypokalemia: Secondary | ICD-10-CM | POA: Diagnosis present

## 2021-12-03 DIAGNOSIS — Z86711 Personal history of pulmonary embolism: Secondary | ICD-10-CM | POA: Diagnosis not present

## 2021-12-03 DIAGNOSIS — Z79899 Other long term (current) drug therapy: Secondary | ICD-10-CM | POA: Diagnosis not present

## 2021-12-03 DIAGNOSIS — R000 Tachycardia, unspecified: Secondary | ICD-10-CM | POA: Diagnosis not present

## 2021-12-03 DIAGNOSIS — M1612 Unilateral primary osteoarthritis, left hip: Secondary | ICD-10-CM | POA: Diagnosis present

## 2021-12-03 DIAGNOSIS — N179 Acute kidney failure, unspecified: Secondary | ICD-10-CM | POA: Diagnosis not present

## 2021-12-03 DIAGNOSIS — Z7985 Long-term (current) use of injectable non-insulin antidiabetic drugs: Secondary | ICD-10-CM | POA: Diagnosis not present

## 2021-12-03 DIAGNOSIS — D638 Anemia in other chronic diseases classified elsewhere: Secondary | ICD-10-CM | POA: Diagnosis present

## 2021-12-03 DIAGNOSIS — I10 Essential (primary) hypertension: Secondary | ICD-10-CM | POA: Diagnosis present

## 2021-12-03 DIAGNOSIS — F17210 Nicotine dependence, cigarettes, uncomplicated: Secondary | ICD-10-CM | POA: Diagnosis present

## 2021-12-03 DIAGNOSIS — D62 Acute posthemorrhagic anemia: Secondary | ICD-10-CM | POA: Diagnosis not present

## 2021-12-03 DIAGNOSIS — Z8249 Family history of ischemic heart disease and other diseases of the circulatory system: Secondary | ICD-10-CM | POA: Diagnosis not present

## 2021-12-03 DIAGNOSIS — Z7901 Long term (current) use of anticoagulants: Secondary | ICD-10-CM | POA: Diagnosis not present

## 2021-12-03 DIAGNOSIS — K7010 Alcoholic hepatitis without ascites: Secondary | ICD-10-CM | POA: Diagnosis present

## 2021-12-03 DIAGNOSIS — N390 Urinary tract infection, site not specified: Secondary | ICD-10-CM | POA: Diagnosis present

## 2021-12-03 DIAGNOSIS — Z6839 Body mass index (BMI) 39.0-39.9, adult: Secondary | ICD-10-CM | POA: Diagnosis not present

## 2021-12-03 LAB — URINE CULTURE: Culture: NO GROWTH

## 2021-12-03 LAB — CBC
HCT: 30.4 % — ABNORMAL LOW (ref 39.0–52.0)
Hemoglobin: 10.4 g/dL — ABNORMAL LOW (ref 13.0–17.0)
MCH: 31 pg (ref 26.0–34.0)
MCHC: 34.2 g/dL (ref 30.0–36.0)
MCV: 90.7 fL (ref 80.0–100.0)
Platelets: 230 10*3/uL (ref 150–400)
RBC: 3.35 MIL/uL — ABNORMAL LOW (ref 4.22–5.81)
RDW: 14.7 % (ref 11.5–15.5)
WBC: 12.4 10*3/uL — ABNORMAL HIGH (ref 4.0–10.5)
nRBC: 0 % (ref 0.0–0.2)

## 2021-12-03 LAB — BASIC METABOLIC PANEL
Anion gap: 6 (ref 5–15)
BUN: 12 mg/dL (ref 6–20)
CO2: 27 mmol/L (ref 22–32)
Calcium: 7.9 mg/dL — ABNORMAL LOW (ref 8.9–10.3)
Chloride: 98 mmol/L (ref 98–111)
Creatinine, Ser: 0.8 mg/dL (ref 0.61–1.24)
GFR, Estimated: >60 mL/min (ref >60)
Glucose, Bld: 146 mg/dL — ABNORMAL HIGH (ref 70–99)
Potassium: 3.3 mmol/L — ABNORMAL LOW (ref 3.5–5.1)
Sodium: 131 mmol/L — ABNORMAL LOW (ref 135–145)

## 2021-12-03 LAB — MAGNESIUM: Magnesium: 2.1 mg/dL (ref 1.7–2.4)

## 2021-12-03 MED ORDER — POTASSIUM CHLORIDE CRYS ER 20 MEQ PO TBCR
40.0000 meq | EXTENDED_RELEASE_TABLET | Freq: Once | ORAL | Status: AC
  Administered 2021-12-03: 40 meq via ORAL
  Filled 2021-12-03: qty 2

## 2021-12-03 MED ORDER — ALPRAZOLAM 0.25 MG PO TABS
0.2500 mg | ORAL_TABLET | Freq: Two times a day (BID) | ORAL | Status: DC
  Administered 2021-12-03 – 2021-12-04 (×3): 0.25 mg via ORAL
  Filled 2021-12-03 (×3): qty 1

## 2021-12-03 NOTE — Plan of Care (Signed)
°  Problem: Pain Management: °Goal: Pain level will decrease with appropriate interventions °Outcome: Progressing °  °Problem: Coping: °Goal: Level of anxiety will decrease °Outcome: Progressing °  °Problem: Safety: °Goal: Ability to remain free from injury will improve °Outcome: Progressing °  °

## 2021-12-03 NOTE — Progress Notes (Signed)
    Subjective:  Patient reports pain as mild.  Denies N/V/CP/SOB/Abd pain.  Doing better with therapy yesterday.  States he continues with some start up thigh pain but overall much improved. Objective:   VITALS:   Vitals:   12/02/21 1054 12/02/21 1258 12/02/21 2052 12/03/21 0558  BP: 128/75 (!) 142/98 129/86 130/83  Pulse: (!) 109 (!) 106 (!) 110 (!) 107  Resp: _0 Temp: 97.6 F (36.4 C) 99 F (37.2 C) 98.7 F (37.1 C) 99.1 F (37.3 C)  TempSrc:   Oral Oral  SpO2: 99% 99% 99% 94%  Weight:      Height:        Patient sitting up in bed. NAD.  Neurologically intact ABD soft Neurovascular intact Sensation intact distally Intact pulses distally Dorsiflexion/Plantar flexion intact Incision: dressing C/D/I No cellulitis present Compartment soft  Sinus tachycardia with O2 at 95-98% ion 1L O2. HR 115.    Lab Results  Component Value Date   WBC 12.4 (H) 12/03/2021   HGB 10.4 (L) 12/03/2021   HCT 30.4 (L) 12/03/2021   MCV 90.7 12/03/2021   PLT 230 12/03/2021   BMET    Component Value Date/Time   NA 134 (L) 12/02/2021 0336   NA 140 07/06/2021 1150   K 3.3 (L) 12/02/2021 0336   CL 101 12/02/2021 0336   CO2 26 12/02/2021 0336   GLUCOSE 121 (H) 12/02/2021 0336   BUN 17 12/02/2021 0336   BUN 10 07/06/2021 1150   CREATININE 1.34 (H) 12/02/2021 0336   CREATININE 0.84 04/26/2021 1117   CALCIUM 7.8 (L) 12/02/2021 0336   EGFR 112 07/06/2021 1150   GFRNONAA >60 12/02/2021 0336     Assessment/Plan: 2 Days Post-Op   Principal Problem:   Osteoarthritis of left hip Active Problems:   Tachycardia  Appreciate hospitalist service for medical management.  Will defer to them for when patient is appropriate to discharge home.   - Cr: 1.33 (0.76 11/22/21).  Awaiting this morning's BMP.  - ABLA: Hemoglobin 10.4, but asymptomatic. Continue to monitor.   WBAT with walker DVT ppx:  Eliquis 2.63m BID , SCDs, TEDS PO pain control: Reports poor pain control  overnight changed mediation to hydromorphone PO.   PT/OT: Patient ambulated 47 feet with PT yesterday. Continue PT.  Dispo: Plan for discharge Saturday vs Sunday pending medical recommendation.   JNicholes Stairs MD 12/03/2021, 8:26 AM   [facility]  [place] [address], [address], [place] [address]Phone: [phone] Facebook  [employer]

## 2021-12-03 NOTE — Plan of Care (Signed)
  Problem: Pain Management: Goal: Pain level will decrease with appropriate interventions Outcome: Progressing   Problem: Coping: Goal: Level of anxiety will decrease Outcome: Progressing   

## 2021-12-03 NOTE — Plan of Care (Signed)
  Problem: Activity: Goal: Ability to avoid complications of mobility impairment will improve Outcome: Progressing Goal: Ability to tolerate increased activity will improve Outcome: Progressing   Problem: Pain Management: Goal: Pain level will decrease with appropriate interventions Outcome: Progressing   

## 2021-12-03 NOTE — Progress Notes (Signed)
PROGRESS NOTE    Daniel [name]  JKD:[id number] DOB: 1970/07/02 DOA: 12/01/2021 PCP: Hoy Register, MD   Brief Narrative:  This is 51 year old gentleman who was admitted for left THA by orthopedics but postoperatively, he had some tachycardia for which hospital service were consulted.  Assessment & Plan:   Principal Problem:   Osteoarthritis of left hip Active Problems:   Tachycardia  Left hip osteoarthritis: Status post THA.  Management per primary service/orthopedics.  Tachycardia/?  UTI: Urine culture is still negative and blood cultures negative as well.  Continue Rocephin and follow culture.  Patient still has tachycardia but it is very mild.  PE negative.  I wonder if his tachycardia is secondary to anxiety since he appears clinically anxious.  I will try Xanax today.  AKI: Labs pending this morning.  Continue IV fluids.  Hypokalemia: Was replaced.  Labs pending this morning.  Hypomagnesemia: Was replaced, rechecking today.  Essential hypertension: Fairly controlled.  Continue home medications.  DVT prophylaxis: apixaban (ELIQUIS) tablet 2.5 mg Start: 12/02/21 0800 SCDs Start: 12/01/21 1457   Code Status: Full Code  Family Communication:  None present at bedside.  Plan of care discussed with patient in length and he/she verbalized understanding and agreed with it.  Estimated body mass index is 39.53 kg/m as calculated from the following:   Height as of this encounter: 5\' 8"  (1.727 m).   Weight as of this encounter: 117.9 kg.    Nutritional Assessment: Body mass index is 39.53 kg/m. Seen by dietician.  I agree with the assessment and plan as outlined below: Nutrition Status:        . Skin Assessment: I have examined the patient's skin and I agree with the wound assessment as performed by the wound care RN as outlined below:    Consultants:  TRH  Procedures:  THA  Antimicrobials:  Anti-infectives (From admission, onward)    Start     Dose/Rate  Route Frequency Ordered Stop   12/02/21 2000  cefTRIAXone (ROCEPHIN) 1 g in sodium chloride 0.9 % 100 mL IVPB        1 g 200 mL/hr over 30 Minutes Intravenous Every 24 hours 12/02/21 1559     12/02/21 0400  ceFEPIme (MAXIPIME) 2 g in sodium chloride 0.9 % 100 mL IVPB  Status:  Discontinued        2 g 200 mL/hr over 30 Minutes Intravenous Every 8 hours 12/02/21 0235 12/02/21 1559   12/01/21 1600  ceFAZolin (ANCEF) IVPB 2g/100 mL premix        2 g 200 mL/hr over 30 Minutes Intravenous Every 6 hours 12/01/21 1457 12/01/21 2218   12/01/21 0700  ceFAZolin (ANCEF) IVPB 2g/100 mL premix        2 g 200 mL/hr over 30 Minutes Intravenous On call to O.R. 12/01/21 13/09/23 12/01/21 0957         Subjective:  Patient seen and examined.  No complaints other than mild hip pain.  Objective: Vitals:   12/02/21 1258 12/02/21 2052 12/03/21 0558 12/03/21 0830  BP: (!) 142/98 129/86 130/83 132/86  Pulse: (!) 106 (!) 110 (!) 107 (!) 105  Resp: 18 18 18    Temp: 99 F (37.2 C) 98.7 F (37.1 C) 99.1 F (37.3 C)   TempSrc:  Oral Oral   SpO2: 99% 99% 94%   Weight:      Height:        Intake/Output Summary (Last 24 hours) at 12/03/2021 1055 Last data filed at 12/03/2021 [phone]  Gross per 24 hour  Intake 2687.18 ml  Output 2150 ml  Net 537.18 ml    Filed Weights   12/01/21 0722  Weight: 117.9 kg    Examination:  General exam: Appears calm and comfortable  Respiratory system: Clear to auscultation. Respiratory effort normal. Cardiovascular system: S1 & S2 heard, RRR. No JVD, murmurs, rubs, gallops or clicks. No pedal edema. Gastrointestinal system: Abdomen is nondistended, soft and nontender. No organomegaly or masses felt. Normal bowel sounds heard. Central nervous system: Alert and oriented. No focal neurological deficits. Extremities: Symmetric 5 x 5 power. Skin: No rashes, lesions or ulcers.  Psychiatry: Judgement and insight appear normal. Mood & affect appropriate.   Data Reviewed: I  have personally reviewed following labs and imaging studies  CBC: Recent Labs  Lab 12/02/21 0056 12/03/21 0429  WBC 13.8* 12.4*  HGB 11.9* 10.4*  HCT 35.5* 30.4*  MCV 91.5 90.7  PLT 274 230    Basic Metabolic Panel: Recent Labs  Lab 12/02/21 0056 12/02/21 0336  NA 134* 134*  K 3.2* 3.3*  CL 100 101  CO2 25 26  GLUCOSE 132* 121*  BUN 17 17  CREATININE 1.33* 1.34*  CALCIUM 8.0* 7.8*  MG 1.3* 1.2*    GFR: Estimated Creatinine Clearance: 83.2 mL/min (A) (by C-G formula based on SCr of 1.34 mg/dL (H)). Liver Function Tests: No results for input(s): "AST", "ALT", "ALKPHOS", "BILITOT", "PROT", "ALBUMIN" in the last 168 hours. No results for input(s): "LIPASE", "AMYLASE" in the last 168 hours. No results for input(s): "AMMONIA" in the last 168 hours. Coagulation Profile: Recent Labs  Lab 12/02/21 0056  INR 1.1    Cardiac Enzymes: No results for input(s): "CKTOTAL", "CKMB", "CKMBINDEX", "TROPONINI" in the last 168 hours. BNP (last 3 results) No results for input(s): "PROBNP" in the last 8760 hours. HbA1C: No results for input(s): "HGBA1C" in the last 72 hours. CBG: Recent Labs  Lab 12/01/21 1153  GLUCAP 119*    Lipid Profile: No results for input(s): "CHOL", "HDL", "LDLCALC", "TRIG", "CHOLHDL", "LDLDIRECT" in the last 72 hours. Thyroid Function Tests: No results for input(s): "TSH", "T4TOTAL", "FREET4", "T3FREE", "THYROIDAB" in the last 72 hours. Anemia Panel: No results for input(s): "VITAMINB12", "FOLATE", "FERRITIN", "TIBC", "IRON", "RETICCTPCT" in the last 72 hours. Sepsis Labs: No results for input(s): "PROCALCITON", "LATICACIDVEN" in the last 168 hours.  Recent Results (from the past 240 hour(s))  Urine Culture     Status: None   Collection Time: 12/02/21  2:27 AM   Specimen: Urine, Clean Catch  Result Value Ref Range Status   Specimen Description   Final    URINE, CLEAN CATCH Performed at [facility], [address]  [place], [place] [address]    Special Requests   Final    NONE Performed at [facility], [address] [place], [place] [address]    Culture   Final    NO GROWTH Performed at [facility], [address] [place], [place] [address]    Report Status 12/03/2021 FINAL  Final  Culture, blood (Routine X 2) w Reflex to ID Panel     Status: None (Preliminary result)   Collection Time: 12/02/21  7:17 AM   Specimen: BLOOD LEFT ARM  Result Value Ref Range Status   Specimen Description   Final    BLOOD LEFT ARM Performed at [facility], [address] [place], [place] [address]    Special Requests   Final    BOTTLES  DRAWN AEROBIC AND ANAEROBIC Blood Culture adequate volume Performed at [facility], [address], [place], [place] [address]    Culture   Final    NO GROWTH < 24 HOURS Performed at [facility], [address] [place], [place] [address]    Report Status PENDING  Incomplete  Culture, blood (Routine X 2) w Reflex to ID Panel     Status: None (Preliminary result)   Collection Time: 12/02/21  7:23 AM   Specimen: BLOOD RIGHT ARM  Result Value Ref Range Status   Specimen Description   Final    BLOOD RIGHT ARM Performed at [facility], [address], [place], [place] [address]    Special Requests   Final    BOTTLES DRAWN AEROBIC AND ANAEROBIC Blood Culture adequate volume Performed at [facility], [address], [place], [place] [address]    Culture   Final    NO GROWTH < 24 HOURS Performed at [facility], [address], [place], [place] [address]    Report Status PENDING  Incomplete     Radiology Studies: NM Pulmonary Perfusion  Result Date: 12/02/2021 CLINICAL DATA:  Pulmonary embolism suspected, high prob EXAM: NUCLEAR MEDICINE PERFUSION LUNG SCAN TECHNIQUE: Perfusion images were obtained in multiple projections after intravenous injection of  radiopharmaceutical. Ventilation scans intentionally deferred if perfusion scan and chest x-ray adequate for interpretation during COVID 19 epidemic. RADIOPHARMACEUTICALS:  4.04 mCi Tc-15m MAA IV COMPARISON:  Chest radiograph from today FINDINGS: There is a uniform distribution of the radiopharmaceutical in both lungs. No significant peripheral segmental perfusion defects to suggest an acute pulmonary embolus. IMPRESSION: 1. No evidence for acute pulmonary embolus. Electronically Signed   By: Signa Kell M.D.   On: 12/02/2021 14:31   DG CHEST PORT 1 VIEW  Result Date: 12/02/2021 CLINICAL DATA:  Tachycardia. EXAM: PORTABLE CHEST 1 VIEW COMPARISON:  03/01/2018. FINDINGS: The heart size and mediastinal contours are within normal limits. No consolidation, effusion, or pneumothorax. No acute osseous abnormality. IMPRESSION: No active disease. Electronically Signed   By: Thornell Sartorius M.D.   On: 12/02/2021 00:59   DG Pelvis Portable  Result Date: 12/01/2021 CLINICAL DATA:  Status post left hip arthroplasty EXAM: PORTABLE PELVIS 1-2 VIEWS COMPARISON:  10/21/2019 FINDINGS: There is interval left hip arthroplasty. There are pockets of air in the soft tissues from recent surgery. Severe degenerative changes are noted in right hip with interval progression. IMPRESSION: Status post left hip arthroplasty. Severe degenerative changes are noted in right hip. Electronically Signed   By: Ernie Avena M.D.   On: 12/01/2021 12:24   DG HIP UNILAT WITH PELVIS 1V LEFT  Result Date: 12/01/2021 CLINICAL DATA:  Left hip replacement. EXAM: DG HIP (WITH OR WITHOUT PELVIS) 1V*L*; DG C-ARM 1-60 MIN-NO REPORT Radiation exposure index: 2.4016 mGy. COMPARISON:  None Available. FINDINGS: Two intraoperative fluoroscopic images were obtained of the left hip. The left femoral and acetabular components are well situated. IMPRESSION: Fluoroscopic guidance provided during left total hip arthroplasty. Electronically Signed   By:  Lupita Raider M.D.   On: 12/01/2021 11:34   DG C-Arm 1-60 Min-No Report  Result Date: 12/01/2021 Fluoroscopy was utilized by the requesting physician.  No radiographic interpretation.   DG C-Arm 1-60 Min-No Report  Result Date: 12/01/2021 Fluoroscopy was utilized by the requesting physician.  No radiographic interpretation.    Scheduled Meds:  allopurinol  300 mg Oral BID   ALPRAZolam  0.25 mg  Oral BID   amLODipine  5 mg Oral Daily   apixaban  2.5 mg Oral Q12H   carvedilol  6.25 mg Oral BID WC   docusate sodium  100 mg Oral BID   senna  1 tablet Oral BID   Continuous Infusions:  cefTRIAXone (ROCEPHIN)  IV 1 g (12/02/21 2155)   lactated ringers 125 mL/hr at 12/03/21 0955   methocarbamol (ROBAXIN) IV Stopped (12/01/21 1416)     LOS: 0 days   Hughie Closs, MD [facility]  12/03/2021, 10:55 AM   *Please note that this is a verbal dictation therefore any spelling or grammatical errors are due to the "Dragon [employer] One" system interpretation.  Please page via Amion and do not message via secure chat for urgent patient care matters. Secure chat can be used for non urgent patient care matters.  How to contact the [facility] Attending or Consulting provider 7A - 7P or covering provider during after hours 7P -7A, for this patient?  Check the care team in [facility] and look for a) attending/consulting TRH provider listed and b) the [facility] team listed. Page or secure chat 7A-7P. Log into [email or web address] and use [place]'s universal password to access. If you do not have the password, please contact the hospital operator. Locate the [facility] provider you are looking for under [facility] and page to a number that you can be directly reached. If you still have difficulty reaching the provider, please page the [facility] (Director on Call) for the Hospitalists listed on amion for assistance.

## 2021-12-03 NOTE — Progress Notes (Signed)
Physical Therapy Treatment Patient Details Name: Daniel [name] MRN: [id number] DOB: 1970/12/08 Today's Date: 12/03/2021   History of Present Illness Pt s/p L DA THR and with hx of Morbid obesity, asthma, and alcoholic hepatitis    PT Comments    Pt is progressing well today. Amb ~ 80', HR 97-112 and SpO2=97% on RA throughout session.   Recommendations for follow up therapy are one component of a multi-disciplinary discharge planning process, led by the attending physician.  Recommendations may be updated based on patient status, additional functional criteria and insurance authorization.  Follow Up Recommendations  Follow physician's recommendations for discharge plan and follow up therapies     Assistance Recommended at Discharge Intermittent Supervision/Assistance  Patient can return home with the following A little help with walking and/or transfers;A little help with bathing/dressing/bathroom;Assist for transportation;Help with stairs or ramp for entrance;Assistance with cooking/housework   Equipment Recommendations  Rolling walker (2 wheels)    Recommendations for Other Services       Precautions / Restrictions Precautions Precautions: Fall Precaution Comments: monitor HR, sats Restrictions Weight Bearing Restrictions: No LLE Weight Bearing: Weight bearing as tolerated     Mobility  Bed Mobility               General bed mobility comments:  (in recliner)    Transfers Overall transfer level: Needs assistance Equipment used: Rolling walker (2 wheels) Transfers: Sit to/from Stand Sit to Stand: Min guard           General transfer comment: cues for hand placement and to power up with RLE >>LLE    Ambulation/Gait Ambulation/Gait assistance: Min guard, Supervision Gait Distance (Feet): 80 Feet Assistive device: Rolling walker (2 wheels) Gait Pattern/deviations: Step-to pattern, Decreased stance time - left Gait velocity: decr     General Gait  Details: verbal cues for sequence; HR max 112   Stairs             Wheelchair Mobility    Modified Rankin (Stroke Patients Only)       Balance                                            Cognition Arousal/Alertness: Awake/alert Behavior During Therapy: WFL for tasks assessed/performed Overall Cognitive Status: Within Functional Limits for tasks assessed                                          Exercises Total Joint Exercises Ankle Circles/Pumps: AROM, Both, 10 reps    General Comments        Pertinent Vitals/Pain Pain Assessment Pain Assessment: 0-10 Pain Score: 5  Pain Location: L hip Pain Descriptors / Indicators: Aching, Burning, Sore Pain Intervention(s): Limited activity within patient's tolerance, Monitored during session, Premedicated before session, Repositioned, Ice applied    Home Living                          Prior Function            PT Goals (current goals can now be found in the care plan section) Acute Rehab PT Goals Patient Stated Goal: Regain IND PT Goal Formulation: With patient Time For Goal Achievement: 12/15/21 Potential to Achieve Goals: Good Progress towards PT goals: Progressing  toward goals    Frequency    7X/week      PT Plan Current plan remains appropriate    Co-evaluation              AM-PAC PT "6 Clicks" Mobility   Outcome Measure  Help needed turning from your back to your side while in a flat bed without using bedrails?: A Little Help needed moving from lying on your back to sitting on the side of a flat bed without using bedrails?: A Little Help needed moving to and from a bed to a chair (including a wheelchair)?: A Little Help needed standing up from a chair using your arms (e.g., wheelchair or bedside chair)?: A Little Help needed to walk in hospital room?: A Little Help needed climbing 3-5 steps with a railing? : A Little 6 Click Score: 18    End  of Session Equipment Utilized During Treatment: Gait belt Activity Tolerance: Patient tolerated treatment well Patient left: in chair;with call bell/phone within reach Nurse Communication: Mobility status PT Visit Diagnosis: Difficulty in walking, not elsewhere classified (R26.2)     Time: [id number] PT Time Calculation (min) (ACUTE ONLY): 29 min  Charges:  $Gait Training: 23-37 mins                     Baxter Flattery, PT  Acute Rehab Dept [facility]) [phone]  [facility] Weekend Pager (Saturday/Sunday only)  [phone]  12/03/2021    [facility] 12/03/2021, 11:00 AM

## 2021-12-03 NOTE — Progress Notes (Signed)
12/03/21 1200  PT Visit Information  Last PT Received On 12/03/21  Assistance Needed  Pt progressing well.  PT goals met, ready to d/c from PT standpoint with family assist. Pt has RW.  RN aware.    History of Present Illness Pt s/p L DA THR and with hx of Morbid obesity, asthma, and alcoholic hepatitis  Subjective Data  Patient Stated Goal Regain IND  Precautions  Precautions Fall  Precaution Comments monitor HR, sats  Restrictions  Weight Bearing Restrictions No  LLE Weight Bearing WBAT  Pain Assessment  Pain Assessment 0-10  Pain Score 5  Pain Location L hip  Pain Descriptors / Indicators Aching;Burning;Sore  Pain Intervention(s) Limited activity within patient's tolerance;Monitored during session;Premedicated before session;Repositioned;Ice applied  Cognition  Arousal/Alertness Awake/alert  Behavior During Therapy WFL for tasks assessed/performed  Overall Cognitive Status Within Functional Limits for tasks assessed  Bed Mobility  General bed mobility comments  (in recliner)  Transfers  Overall transfer level Needs assistance  Equipment used Rolling walker (2 wheels)  Transfers Sit to/from Stand  Sit to Stand Min guard  General transfer comment cues for hand placement and to power up with RLE >>LLE  Ambulation/Gait  Ambulation/Gait assistance Min guard;Supervision  Gait Distance (Feet) 30 Feet  Assistive device Rolling walker (2 wheels)  Gait Pattern/deviations Step-to pattern;Decreased stance time - left  General Gait Details verbal cues for sequence; HR max 112  Gait velocity decr  Stairs Yes  Stairs assistance Min guard  Stair Management Step to pattern;Forwards;With cane  Number of Stairs 3  General stair comments cues for sequence, min/guard to supervision for safety. good stability, no LOB  Total Joint Exercises  Ankle Circles/Pumps AROM;Both;10 reps  Heel Slides AAROM;Left;5 reps  Hip ABduction/ADduction AAROM;Left;5 reps;Limitations  Hip  Abduction/Adduction Limitations pain limiting ROM  PT - End of Session  Equipment Utilized During Treatment Gait belt  Activity Tolerance Patient tolerated treatment well  Patient left in chair;with call bell/phone within reach  Nurse Communication Mobility status   PT - Assessment/Plan  PT Plan Current plan remains appropriate  PT Visit Diagnosis Difficulty in walking, not elsewhere classified (R26.2)  PT Frequency (ACUTE ONLY) 7X/week  Follow Up Recommendations Follow physician's recommendations for discharge plan and follow up therapies  Assistance recommended at discharge Intermittent Supervision/Assistance  Patient can return home with the following A little help with walking and/or transfers;A little help with bathing/dressing/bathroom;Assist for transportation;Help with stairs or ramp for entrance;Assistance with cooking/housework  PT equipment Rolling walker (2 wheels)  AM-PAC PT "6 Clicks" Mobility Outcome Measure (Version 2)  Help needed turning from your back to your side while in a flat bed without using bedrails? 3  Help needed moving from lying on your back to sitting on the side of a flat bed without using bedrails? 3  Help needed moving to and from a bed to a chair (including a wheelchair)? 3  Help needed standing up from a chair using your arms (e.g., wheelchair or bedside chair)? 3  Help needed to walk in hospital room? 3  Help needed climbing 3-5 steps with a railing?  3  6 Click Score 18  Consider Recommendation of Discharge To: Home with [facility]  Progressive Mobility  What is the highest level of mobility based on the progressive mobility assessment? Level 5 (Walks with assist in room/hall) - Balance while stepping forward/back and can walk in room with assist - Complete  PT Goal Progression  Progress towards PT goals Progressing toward goals  Acute  Rehab PT Goals  PT Goal Formulation With patient  Time For Goal Achievement 12/15/21  Potential to Achieve Goals Good  PT  Time Calculation  PT Start Time (ACUTE ONLY) 1222  PT Stop Time (ACUTE ONLY) 1241  PT Time Calculation (min) (ACUTE ONLY) 19 min  PT General Charges  $$ ACUTE PT VISIT 1 Visit  PT Treatments  $Gait Training 8-22 mins

## 2021-12-04 DIAGNOSIS — M1612 Unilateral primary osteoarthritis, left hip: Secondary | ICD-10-CM | POA: Diagnosis not present

## 2021-12-04 LAB — BASIC METABOLIC PANEL
Anion gap: 7 (ref 5–15)
BUN: 9 mg/dL (ref 6–20)
CO2: 28 mmol/L (ref 22–32)
Calcium: 8 mg/dL — ABNORMAL LOW (ref 8.9–10.3)
Chloride: 96 mmol/L — ABNORMAL LOW (ref 98–111)
Creatinine, Ser: 0.69 mg/dL (ref 0.61–1.24)
GFR, Estimated: >60 mL/min (ref >60)
Glucose, Bld: 163 mg/dL — ABNORMAL HIGH (ref 70–99)
Potassium: 3.4 mmol/L — ABNORMAL LOW (ref 3.5–5.1)
Sodium: 131 mmol/L — ABNORMAL LOW (ref 135–145)

## 2021-12-04 LAB — MAGNESIUM: Magnesium: 2.1 mg/dL (ref 1.7–2.4)

## 2021-12-04 MED ORDER — SULFAMETHOXAZOLE-TRIMETHOPRIM 800-160 MG PO TABS: 1.0000 | ORAL_TABLET | Freq: Two times a day (BID) | ORAL | 0 refills | Status: AC

## 2021-12-04 MED ORDER — POTASSIUM CHLORIDE CRYS ER 20 MEQ PO TBCR
40.0000 meq | EXTENDED_RELEASE_TABLET | Freq: Once | ORAL | Status: AC
  Administered 2021-12-04: 40 meq via ORAL
  Filled 2021-12-04: qty 2

## 2021-12-04 NOTE — Plan of Care (Signed)
  Problem: Education: Goal: Understanding of discharge needs will improve Outcome: Progressing   Problem: Activity: Goal: Ability to avoid complications of mobility impairment will improve Outcome: Progressing   Problem: Pain Management: Goal: Pain level will decrease with appropriate interventions Outcome: Progressing

## 2021-12-04 NOTE — Progress Notes (Signed)
Subjective:  Patient reports pain as mild.  Denies N/V/CP/SOB. Patient states he wants to go home.  Objective:   VITALS:   Vitals:   12/03/21 1352 12/03/21 1935 12/03/21 2059 12/04/21 0445  BP: 131/81  (!) 137/91 118/74  Pulse: 99  (!) 105 (!) 108  Resp: _0 Temp: 98.7 F (37.1 C)  99.7 F (37.6 C) 100 F (37.8 C)  TempSrc: Oral  Oral Oral  SpO2: 99%  96% 95%  Weight:  117.9 kg    Height:  _1  (1.727 m)      NAD ABD soft Sensation intact distally Intact pulses distally Dorsiflexion/Plantar flexion intact Incision: dressing C/D/I Compartment soft   Lab Results  Component Value Date   WBC 12.4 (H) 12/03/2021   HGB 10.4 (L) 12/03/2021   HCT 30.4 (L) 12/03/2021   MCV 90.7 12/03/2021   PLT 230 12/03/2021   BMET    Component Value Date/Time   NA 131 (L) 12/03/2021 0921   NA 140 07/06/2021 1150   K 3.3 (L) 12/03/2021 0921   CL 98 12/03/2021 0921   CO2 27 12/03/2021 0921   GLUCOSE 146 (H) 12/03/2021 0921   BUN 12 12/03/2021 0921   BUN 10 07/06/2021 1150   CREATININE 0.80 12/03/2021 0921   CREATININE 0.84 04/26/2021 1117   CALCIUM 7.9 (L) 12/03/2021 0921   EGFR 112 07/06/2021 1150   GFRNONAA >60 12/03/2021 0017    Recent Results (from the past 240 hour(s))  Urine Culture     Status: None   Collection Time: 12/02/21  2:27 AM   Specimen: Urine, Clean Catch  Result Value Ref Range Status   Specimen Description   Final    URINE, CLEAN CATCH Performed at [facility], [place] [address], [place], [place] [address]    Special Requests   Final    NONE Performed at [facility], [place] [address] [place], [place] [address]    Culture   Final    NO GROWTH Performed at [facility], [place] [address], [place], [place] [address]    Report Status 12/03/2021 FINAL  Final  Culture, blood (Routine X 2) w Reflex to ID Panel     Status: None (Preliminary result)   Collection Time: 12/02/21  7:17 AM   Specimen: BLOOD  LEFT ARM  Result Value Ref Range Status   Specimen Description   Final    BLOOD LEFT ARM Performed at [place] [address], [place], [place] [address]    Special Requests   Final    BOTTLES DRAWN AEROBIC AND ANAEROBIC Blood Culture adequate volume Performed at [place] [address], [place], [place] [address]    Culture   Final    NO GROWTH 2 DAYS Performed at [place] [address], [place], [place] [address]    Report Status PENDING  Incomplete  Culture, blood (Routine X 2) w Reflex to ID Panel     Status: None (Preliminary result)   Collection Time: 12/02/21  7:23 AM   Specimen: BLOOD RIGHT ARM  Result Value Ref Range Status   Specimen Description   Final    BLOOD RIGHT ARM Performed at [place] [address] [place], [place] [address]    Special Requests   Final    BOTTLES DRAWN AEROBIC AND ANAEROBIC Blood Culture adequate volume Performed at [facility]  [address] [place], [place] [address]    Culture   Final    NO GROWTH 2 DAYS Performed at [facility], [place] [address], [place], [place] [address]    Report Status PENDING  Incomplete    VQ scan negative (11/10)  Assessment/Plan: 3 Days Post-Op   Principal Problem:   Osteoarthritis of left hip Active Problems:   Tachycardia   WBAT with walker DVT ppx:  Eliquis , SCDs, TEDS PO pain control PT/OT Appreciate TRH assistance Dispo: D/C home with HEP when ok with [place] 12/04/2021, 8:51 AM   Rod Can, MD [phone] [place] [facility] now [facility]  [place] [address] [place], [place], [place] [address] Phone: [phone] [email or web address] Facebook  [employer]

## 2021-12-04 NOTE — Plan of Care (Signed)
  Problem: Education: Goal: Knowledge of the prescribed therapeutic regimen will improve Outcome: Completed/Met Goal: Understanding of discharge needs will improve 12/04/2021 1152 by Iver Nestle A, LPN Outcome: Completed/Met 12/04/2021 0800 by Lise Auer, LPN Outcome: Progressing   Problem: Activity: Goal: Ability to avoid complications of mobility impairment will improve 12/04/2021 1152 by Iver Nestle A, LPN Outcome: Completed/Met 12/04/2021 0800 by Lise Auer, LPN Outcome: Progressing Goal: Ability to tolerate increased activity will improve Outcome: Completed/Met   Problem: Clinical Measurements: Goal: Postoperative complications will be avoided or minimized Outcome: Completed/Met   Problem: Pain Management: Goal: Pain level will decrease with appropriate interventions 12/04/2021 1152 by Iver Nestle A, LPN Outcome: Completed/Met 12/04/2021 0800 by Lise Auer, LPN Outcome: Progressing   Problem: Skin Integrity: Goal: Will show signs of wound healing Outcome: Completed/Met   Problem: Health Behavior/Discharge Planning: Goal: Ability to manage health-related needs will improve Outcome: Completed/Met   Problem: Clinical Measurements: Goal: Ability to maintain clinical measurements within normal limits will improve Outcome: Completed/Met Goal: Will remain free from infection Outcome: Completed/Met Goal: Diagnostic test results will improve Outcome: Completed/Met Goal: Respiratory complications will improve Outcome: Completed/Met Goal: Cardiovascular complication will be avoided Outcome: Completed/Met   Problem: Activity: Goal: Risk for activity intolerance will decrease Outcome: Completed/Met   Problem: Nutrition: Goal: Adequate nutrition will be maintained Outcome: Completed/Met   Problem: Coping: Goal: Level of anxiety will decrease Outcome: Completed/Met   Problem: Elimination: Goal: Will not experience complications related to  bowel motility Outcome: Completed/Met Goal: Will not experience complications related to urinary retention Outcome: Completed/Met   Problem: Pain Managment: Goal: General experience of comfort will improve Outcome: Completed/Met   Problem: Safety: Goal: Ability to remain free from injury will improve Outcome: Completed/Met   Problem: Skin Integrity: Goal: Risk for impaired skin integrity will decrease Outcome: Completed/Met

## 2021-12-04 NOTE — Progress Notes (Signed)
PROGRESS NOTE    Daniel [name]  ZOX:[id number] DOB: [birth year] DOA: 12/01/2021 PCP: Hoy Register, MD   Brief Narrative:  This is 51 year old gentleman who was admitted for left THA by orthopedics but postoperatively, he had some tachycardia for which hospital service were consulted.  Assessment & Plan:   Principal Problem:   Osteoarthritis of left hip Active Problems:   Tachycardia  Left hip osteoarthritis: Status post THA.  Management per primary service/orthopedics.  Tachycardia/?  UTI: Urine culture is still negative and blood cultures negative as well.  Patient is now fairly stable and can be discharged from medical perspective.  He has received 3 days of IV Rocephin.  We will discharge him on Bactrim DS for 4 days.  I have sent the prescription to his pharmacy.  AKI: Labs pending this morning.  Continue IV fluids.  Hypokalemia: Low again.  Will replace before discharge.  Hypomagnesemia: Resolved.  Essential hypertension: Fairly controlled.  Continue home medications.  DVT prophylaxis: apixaban (ELIQUIS) tablet 2.5 mg Start: 12/02/21 0800 SCDs Start: 12/01/21 1457   Code Status: Full Code  Family Communication:  None present at bedside.  Plan of care discussed with patient in length and he/she verbalized understanding and agreed with it.  Estimated body mass index is 39.52 kg/m as calculated from the following:   Height as of this encounter: 5\' 8"  (1.727 m).   Weight as of this encounter: 117.9 kg.    Nutritional Assessment: Body mass index is 39.52 kg/m. Seen by dietician.  I agree with the assessment and plan as outlined below: Nutrition Status:        . Skin Assessment: I have examined the patient's skin and I agree with the wound assessment as performed by the wound care RN as outlined below:    Consultants:  TRH  Procedures:  THA  Antimicrobials:  Anti-infectives (From admission, onward)    Start     Dose/Rate Route Frequency Ordered Stop    12/04/21 0000  sulfamethoxazole-trimethoprim (BACTRIM DS) 800-160 MG tablet        1 tablet Oral 2 times daily 12/04/21 1044 12/08/21 2359   12/02/21 2000  cefTRIAXone (ROCEPHIN) 1 g in sodium chloride 0.9 % 100 mL IVPB        1 g 200 mL/hr over 30 Minutes Intravenous Every 24 hours 12/02/21 1559     12/02/21 0400  ceFEPIme (MAXIPIME) 2 g in sodium chloride 0.9 % 100 mL IVPB  Status:  Discontinued        2 g 200 mL/hr over 30 Minutes Intravenous Every 8 hours 12/02/21 0235 12/02/21 1559   12/01/21 1600  ceFAZolin (ANCEF) IVPB 2g/100 mL premix        2 g 200 mL/hr over 30 Minutes Intravenous Every 6 hours 12/01/21 1457 12/01/21 2218   12/01/21 0700  ceFAZolin (ANCEF) IVPB 2g/100 mL premix        2 g 200 mL/hr over 30 Minutes Intravenous On call to O.R. 12/01/21 13/09/23 12/01/21 0957         Subjective:  Patient seen and examined.  He feels well.  He is off of oxygen.  He thinks that he is ready for discharge.  He did endorse having anxiety and that could be the reason for his mild tachycardia.  Objective: Vitals:   12/03/21 1935 12/03/21 2059 12/04/21 0445 12/04/21 0852  BP:  (!) 137/91 118/74 (!) 123/94  Pulse:  (!) 105 (!) 108   Resp:  18 18   Temp:  99.7 F (37.6 C) 100 F (37.8 C)   TempSrc:  Oral Oral   SpO2:  96% 95%   Weight: 117.9 kg     Height: 5\' 8"  (1.727 m)       Intake/Output Summary (Last 24 hours) at 12/04/2021 1044 Last data filed at 12/04/2021 1041 Gross per 24 hour  Intake 2920.16 ml  Output 4025 ml  Net -1104.84 ml    Filed Weights   12/01/21 0722 12/03/21 1935  Weight: 117.9 kg 117.9 kg    Examination:  General exam: Appears calm and comfortable  Respiratory system: Clear to auscultation. Respiratory effort normal. Cardiovascular system: S1 & S2 heard, RRR. No JVD, murmurs, rubs, gallops or clicks. No pedal edema. Gastrointestinal system: Abdomen is nondistended, soft and nontender. No organomegaly or masses felt. Normal bowel sounds  heard. Central nervous system: Alert and oriented. No focal neurological deficits.   Data Reviewed: I have personally reviewed following labs and imaging studies  CBC: Recent Labs  Lab 12/02/21 0056 12/03/21 0429  WBC 13.8* 12.4*  HGB 11.9* 10.4*  HCT 35.5* 30.4*  MCV 91.5 90.7  PLT 274 230    Basic Metabolic Panel: Recent Labs  Lab 12/02/21 0056 12/02/21 0336 12/03/21 0921 12/04/21 0915  NA 134* 134* 131* 131*  K 3.2* 3.3* 3.3* 3.4*  CL 100 101 98 96*  CO2 25 26 27 28   GLUCOSE 132* 121* 146* 163*  BUN 17 17 12 9   CREATININE 1.33* 1.34* 0.80 0.69  CALCIUM 8.0* 7.8* 7.9* 8.0*  MG 1.3* 1.2* 2.1 2.1    GFR: Estimated Creatinine Clearance: 139.3 mL/min (by C-G formula based on SCr of 0.69 mg/dL). Liver Function Tests: No results for input(s): "AST", "ALT", "ALKPHOS", "BILITOT", "PROT", "ALBUMIN" in the last 168 hours. No results for input(s): "LIPASE", "AMYLASE" in the last 168 hours. No results for input(s): "AMMONIA" in the last 168 hours. Coagulation Profile: Recent Labs  Lab 12/02/21 0056  INR 1.1    Cardiac Enzymes: No results for input(s): "CKTOTAL", "CKMB", "CKMBINDEX", "TROPONINI" in the last 168 hours. BNP (last 3 results) No results for input(s): "PROBNP" in the last 8760 hours. HbA1C: No results for input(s): "HGBA1C" in the last 72 hours. CBG: Recent Labs  Lab 12/01/21 1153  GLUCAP 119*    Lipid Profile: No results for input(s): "CHOL", "HDL", "LDLCALC", "TRIG", "CHOLHDL", "LDLDIRECT" in the last 72 hours. Thyroid Function Tests: No results for input(s): "TSH", "T4TOTAL", "FREET4", "T3FREE", "THYROIDAB" in the last 72 hours. Anemia Panel: No results for input(s): "VITAMINB12", "FOLATE", "FERRITIN", "TIBC", "IRON", "RETICCTPCT" in the last 72 hours. Sepsis Labs: No results for input(s): "PROCALCITON", "LATICACIDVEN" in the last 168 hours.  Recent Results (from the past 240 hour(s))  Urine Culture     Status: None   Collection Time:  12/02/21  2:27 AM   Specimen: Urine, Clean Catch  Result Value Ref Range Status   Specimen Description   Final    URINE, CLEAN CATCH Performed at [facility], [address] [place], [place]    Special Requests   Final    NONE Performed at [facility], [address] [place], [place]    Culture   Final    NO GROWTH Performed at [facility], [address], [place], [facility] [address]    Report Status 12/03/2021 FINAL  Final  Culture, blood (Routine X 2) w Reflex to ID Panel     Status: None (Preliminary result)   Collection  Time: 12/02/21  7:17 AM   Specimen: BLOOD LEFT ARM  Result Value Ref Range Status   Specimen Description   Final    BLOOD LEFT ARM Performed at [facility], [address] [place], [place] [address]    Special Requests   Final    BOTTLES DRAWN AEROBIC AND ANAEROBIC Blood Culture adequate volume Performed at [facility], [address], [place], [place] [address]    Culture   Final    NO GROWTH 2 DAYS Performed at [facility], [address] [place], [place] [address]    Report Status PENDING  Incomplete  Culture, blood (Routine X 2) w Reflex to ID Panel     Status: None (Preliminary result)   Collection Time: 12/02/21  7:23 AM   Specimen: BLOOD RIGHT ARM  Result Value Ref Range Status   Specimen Description   Final    BLOOD RIGHT ARM Performed at [facility], [address] [place], [place] [address]    Special Requests   Final    BOTTLES DRAWN AEROBIC AND ANAEROBIC Blood Culture adequate volume Performed at [facility], [address], [place], [place] [address]    Culture   Final    NO GROWTH 2 DAYS Performed at [facility], [address], [place], [place] [address]    Report Status PENDING  Incomplete     Radiology Studies: NM Pulmonary Perfusion  Result Date:  12/02/2021 CLINICAL DATA:  Pulmonary embolism suspected, high prob EXAM: NUCLEAR MEDICINE PERFUSION LUNG SCAN TECHNIQUE: Perfusion images were obtained in multiple projections after intravenous injection of radiopharmaceutical. Ventilation scans intentionally deferred if perfusion scan and chest x-ray adequate for interpretation during COVID 19 epidemic. RADIOPHARMACEUTICALS:  4.04 mCi Tc-21m MAA IV COMPARISON:  Chest radiograph from today FINDINGS: There is a uniform distribution of the radiopharmaceutical in both lungs. No significant peripheral segmental perfusion defects to suggest an acute pulmonary embolus. IMPRESSION: 1. No evidence for acute pulmonary embolus. Electronically Signed   By: Signa Kell M.D.   On: 12/02/2021 14:31    Scheduled Meds:  allopurinol  300 mg Oral BID   ALPRAZolam  0.25 mg Oral BID   amLODipine  5 mg Oral Daily   apixaban  2.5 mg Oral Q12H   carvedilol  6.25 mg Oral BID WC   docusate sodium  100 mg Oral BID   potassium chloride  40 mEq Oral Once   senna  1 tablet Oral BID   Continuous Infusions:  cefTRIAXone (ROCEPHIN)  IV 200 mL/hr at 12/04/21 1041   lactated ringers 125 mL/hr at 12/03/21 0955   methocarbamol (ROBAXIN) IV Stopped (12/01/21 1416)     LOS: 1 day   Hughie Closs, MD [facility]  12/04/2021, 10:44 AM   *Please note that this is a verbal dictation therefore any spelling or grammatical errors are due to the "Dragon Medical One" system interpretation.  Please page via Amion and do not message via secure chat for urgent patient care matters. Secure chat can be used for non urgent patient care matters.  How to contact the [facility] Attending or Consulting provider 7A - 7P or covering provider during after hours 7P -7A, for this patient?  Check the care team in [facility] and look for a) attending/consulting TRH provider listed and b) the [facility] team listed. Page or secure chat 7A-7P. Log into [email or web address] and use [place]'s universal password to  access. If you do not  have the password, please contact the hospital operator. Locate the [facility] provider you are looking for under [facility] and page to a number that you can be directly reached. If you still have difficulty reaching the provider, please page the [facility] (Director on Call) for the Hospitalists listed on amion for assistance.

## 2021-12-04 NOTE — Progress Notes (Signed)
Physical Therapy Treatment Patient Details Name: Daniel [name] MRN: [id number] DOB: 1970/03/11 Today's Date: 12/04/2021   History of Present Illness Pt s/p L DA THR and with hx of Morbid obesity, asthma, and alcoholic hepatitis    PT Comments    Pt progressing, slow but steady gait with incr gait distance--progressing to step through pattern toleraitng incr wt LLE. LLE edematous, LEs elevated with ice to L hip. Encouraged mobility progression at home.   Recommendations for follow up therapy are one component of a multi-disciplinary discharge planning process, led by the attending physician.  Recommendations may be updated based on patient status, additional functional criteria and insurance authorization.  Follow Up Recommendations  Follow physician's recommendations for discharge plan and follow up therapies     Assistance Recommended at Discharge Intermittent Supervision/Assistance  Patient can return home with the following A little help with walking and/or transfers;A little help with bathing/dressing/bathroom;Assist for transportation;Help with stairs or ramp for entrance;Assistance with cooking/housework   Equipment Recommendations  Rolling walker (2 wheels)    Recommendations for Other Services       Precautions / Restrictions Precautions Precautions: Fall Precaution Comments: monitor HR, sats Restrictions Weight Bearing Restrictions: No LLE Weight Bearing: Weight bearing as tolerated     Mobility  Bed Mobility               General bed mobility comments:  (in recliner)    Transfers Overall transfer level: Needs assistance Equipment used: Rolling walker (2 wheels) Transfers: Sit to/from Stand Sit to Stand: Supervision, Modified independent (Device/Increase time)           General transfer comment: cues for hand placement and to power up with RLE >>LLE--able to self cue    Ambulation/Gait Ambulation/Gait assistance: Supervision Gait Distance  (Feet): 100 Feet Assistive device: Rolling walker (2 wheels) Gait Pattern/deviations: Step-to pattern, Decreased stance time - left Gait velocity: decr     General Gait Details: verbal cues for sequence; HR max 118, resting low 100s   Stairs             Wheelchair Mobility    Modified Rankin (Stroke Patients Only)       Balance                                            Cognition Arousal/Alertness: Awake/alert Behavior During Therapy: WFL for tasks assessed/performed Overall Cognitive Status: Within Functional Limits for tasks assessed                                          Exercises Total Joint Exercises Ankle Circles/Pumps: AROM, Both, 10 reps Heel Slides: AAROM, Left, 5 reps    General Comments        Pertinent Vitals/Pain Pain Assessment Pain Assessment: 0-10 Pain Score: 4  Pain Location: L hip Pain Descriptors / Indicators: Aching, Burning, Sore Pain Intervention(s): Limited activity within patient's tolerance, Monitored during session, Premedicated before session, Repositioned, Ice applied    Home Living                          Prior Function            PT Goals (current goals can now be found in the care plan section)  Acute Rehab PT Goals Patient Stated Goal: Regain IND PT Goal Formulation: With patient Time For Goal Achievement: 12/15/21 Potential to Achieve Goals: Good Progress towards PT goals: Progressing toward goals    Frequency    7X/week      PT Plan Current plan remains appropriate    Co-evaluation              AM-PAC PT "6 Clicks" Mobility   Outcome Measure  Help needed turning from your back to your side while in a flat bed without using bedrails?: A Little Help needed moving from lying on your back to sitting on the side of a flat bed without using bedrails?: A Little Help needed moving to and from a bed to a chair (including a wheelchair)?: A Little Help  needed standing up from a chair using your arms (e.g., wheelchair or bedside chair)?: A Little Help needed to walk in hospital room?: A Little Help needed climbing 3-5 steps with a railing? : A Little 6 Click Score: 18    End of Session Equipment Utilized During Treatment: Gait belt Activity Tolerance: Patient tolerated treatment well Patient left: in chair;with call bell/phone within reach (legs elevated) Nurse Communication: Mobility status PT Visit Diagnosis: Difficulty in walking, not elsewhere classified (R26.2)     Time: 1004-1030 PT Time Calculation (min) (ACUTE ONLY): 26 min  Charges:  $Gait Training: 23-37 mins                     Baxter Flattery, PT  Acute Rehab Dept [facility]) [phone]  [facility] Weekend Pager (Saturday/Sunday only)  [phone]  12/04/2021    [facility] 12/04/2021, 10:32 AM

## 2021-12-05 ENCOUNTER — Other Ambulatory Visit: Payer: Self-pay | Admitting: Family Medicine

## 2021-12-05 ENCOUNTER — Telehealth: Payer: Self-pay

## 2021-12-05 DIAGNOSIS — I10 Essential (primary) hypertension: Secondary | ICD-10-CM

## 2021-12-05 NOTE — Telephone Encounter (Signed)
From the discharge call:   He stated he is doing okay.   He said he has all of his medications but he needs a refill of carvedilol, he only has 2 pills left.    needs assistance with personal care, has rollator to use with ambulation.   Scheduled to see Dr Alvis Lemmings - 01/09/2022.   He said he has his appointment with the the surgeon but he was not sure of the day/time.

## 2021-12-05 NOTE — Telephone Encounter (Signed)
Transition Care Management Follow-up Telephone Call Date of discharge and from where: 12/04/2021, [facility] How have you been since you were released from the hospital? He stated he is doing okay.  Any questions or concerns? No  Items Reviewed: Did the pt receive and understand the discharge instructions provided? Yes  Medications obtained and verified? Yes - he said he has all of his medications but he needs a refill of carvedilol, he only has 2 pills left.  Other? No  Any new allergies since your discharge? No  Dietary orders reviewed? Yes Do you have support at home? Yes   Home Care and Equipment/Supplies: Were home health services ordered? no If so, what is the name of the agency? N/a  Has the agency set up a time to come to the patient's home? not applicable Were any new equipment or medical supplies ordered?  No What is the name of the medical supply agency? N/a Were you able to get the supplies/equipment? not applicable Do you have any questions related to the use of the equipment or supplies? No  Functional Questionnaire: (I = Independent and D = Dependent) ADLs: needs assistance with personal care, has rollator to use with ambulation.    Follow up appointments reviewed:  PCP Hospital f/u appt confirmed? Yes  Scheduled to see Dr Alvis Lemmings - 01/09/2022.  Specialist Hospital f/u appt confirmed?  He said he has his appointment with the the surgeon but he was not sure of the day/time.    Are transportation arrangements needed? No  If their condition worsens, is the pt aware to call PCP or go to the Emergency Dept.? Yes Was the patient provided with contact information for the PCP's office or ED? Yes Was to pt encouraged to call back with questions or concerns? Yes

## 2021-12-05 NOTE — Discharge Summary (Signed)
Physician Discharge Summary  Patient ID: Daniel [name] MRN: [id number] DOB/AGE: [birth year] 49 y.o.  Admit date: 12/01/2021 Discharge date: 12/04/2021  Admission Diagnoses:  Osteoarthritis of left hip  Discharge Diagnoses:  Principal Problem:   Osteoarthritis of left hip Active Problems:   Tachycardia   Past Medical History:  Diagnosis Date   Anxiety    Arthritis    Asthma    Hypertension    Pre-diabetes     Surgeries: Procedure(s): TOTAL HIP ARTHROPLASTY ANTERIOR APPROACH on 12/01/2021   Consultants (if any): Treatment Team:  Shanda Howells, MD Hughie Closs, MD  Discharged Condition: Improved  Hospital Course: Daniel [name] is an 51 y.o. male who was admitted 12/01/2021 with a diagnosis of Osteoarthritis of left hip and went to the operating room on 12/01/2021 and underwent the above named procedures.    He was given perioperative antibiotics:  Anti-infectives (From admission, onward)    Start     Dose/Rate Route Frequency Ordered Stop   12/04/21 0000  sulfamethoxazole-trimethoprim (BACTRIM DS) 800-160 MG tablet        1 tablet Oral 2 times daily 12/04/21 1044 12/08/21 2359   12/02/21 2000  cefTRIAXone (ROCEPHIN) 1 g in sodium chloride 0.9 % 100 mL IVPB  Status:  Discontinued        1 g 200 mL/hr over 30 Minutes Intravenous Every 24 hours 12/02/21 1559 12/04/21 1713   12/02/21 0400  ceFEPIme (MAXIPIME) 2 g in sodium chloride 0.9 % 100 mL IVPB  Status:  Discontinued        2 g 200 mL/hr over 30 Minutes Intravenous Every 8 hours 12/02/21 0235 12/02/21 1559   12/01/21 1600  ceFAZolin (ANCEF) IVPB 2g/100 mL premix        2 g 200 mL/hr over 30 Minutes Intravenous Every 6 hours 12/01/21 1457 12/01/21 2218   12/01/21 0700  ceFAZolin (ANCEF) IVPB 2g/100 mL premix        2 g 200 mL/hr over 30 Minutes Intravenous On call to O.R. 12/01/21 4098 12/01/21 0957       He was given sequential compression devices, early ambulation, and Eliquis for DVT prophylaxis.  POD#1  Patient was having difficulty with pain control. Overnight he also had some issues with sinus tachycardia and TRH was consulted. UA: consistent with UTI. Patient was started on cefepime. Urine culture pending. K+ 3.2, Mg 1.3. Replacing with LR. VQ scan ordered to r/o PE due to PMH that was negative. Cr: 1.33 (0.76 11/22/21). Discontinued celebrex. ABLA: Hemoglobin 11.9. PT was held this day until completion of medical testing.  POD#2  Creatinine improved to 0.80 (Cr: 1.33 POD#1). ABLA: Hemoglobin 10.4, but asymptomatic. Urine culture negative. Hypokalemia continued and was repleated. Hypomagnesemia resolved. Sinus tachycardia continued but mild. He ambulated 80 feet with PT. Pain improved.   POD#3 Urine culture is still negative and blood cultures negative as well. Discharged him on Bactrim DS for 4 days for UTI. Hypokalemia continued and was repleated. He was cleared from a medical perspective. He ambulated 100 feet with PT. He was discharged home with HEP.   He benefited maximally from the hospital stay and there were no complications.    Recent vital signs:  Vitals:   12/04/21 0445 12/04/21 0852  BP: 118/74 (!) 123/94  Pulse: (!) 108   Resp: 18   Temp: 100 F (37.8 C)   SpO2: 95%     Recent laboratory studies:  Lab Results  Component Value Date   HGB 10.4 (L) 12/03/2021  HGB 11.9 (L) 12/02/2021   HGB 14.2 11/22/2021   Lab Results  Component Value Date   WBC 12.4 (H) 12/03/2021   PLT 230 12/03/2021   Lab Results  Component Value Date   INR 1.1 12/02/2021   Lab Results  Component Value Date   NA 131 (L) 12/04/2021   K 3.4 (L) 12/04/2021   CL 96 (L) 12/04/2021   CO2 28 12/04/2021   BUN 9 12/04/2021   CREATININE 0.69 12/04/2021   GLUCOSE 163 (H) 12/04/2021     Allergies as of 12/04/2021   No Known Allergies      Medication List     STOP taking these medications    buPROPion 150 MG 24 hr tablet Commonly known as: Wellbutrin XL   celecoxib 200 MG  capsule Commonly known as: CELEBREX   clobetasol cream 0.05 % Commonly known as: TEMOVATE       TAKE these medications    acetaminophen 500 MG tablet Commonly known as: TYLENOL Take 1,000 mg by mouth every 6 (six) hours as needed for mild pain or moderate pain.   albuterol 108 (90 Base) MCG/ACT inhaler Commonly known as: VENTOLIN HFA Inhale 1 puff into the lungs every 6 (six) hours as needed for wheezing or shortness of breath.   allopurinol 300 MG tablet Commonly known as: ZYLOPRIM TAKE 1 TABLET BY MOUTH 2 TIMES DAILY.   amLODipine 5 MG tablet Commonly known as: NORVASC TAKE 1 TABLET BY MOUTH EVERY DAY   apixaban 2.5 MG Tabs tablet Commonly known as: Eliquis Take 1 tablet (2.5 mg total) by mouth 2 (two) times daily.   carvedilol 6.25 MG tablet Commonly known as: COREG Take 1 tablet (6.25 mg total) by mouth 2 (two) times daily with a meal.   docusate sodium 100 MG capsule Commonly known as: Colace Take 1 capsule (100 mg total) by mouth 2 (two) times daily.   hydrochlorothiazide 25 MG tablet Commonly known as: HYDRODIURIL TAKE 1 TABLET (25 MG TOTAL) BY MOUTH DAILY.   methocarbamol 500 MG tablet Commonly known as: ROBAXIN Take 1 tablet (500 mg total) by mouth every 6 (six) hours as needed for muscle spasms.   ondansetron 4 MG tablet Commonly known as: Zofran Take 1 tablet (4 mg total) by mouth every 8 (eight) hours as needed for nausea or vomiting.   oxyCODONE 5 MG immediate release tablet Commonly known as: Roxicodone Take 1-2 tablets (5-10 mg total) by mouth every 4 (four) hours as needed for severe pain. What changed:  medication strength how much to take when to take this reasons to take this   Ozempic (0.25 or 0.5 MG/DOSE) 2 MG/3ML Sopn Generic drug: Semaglutide(0.25 or 0.5MG /DOS) Inject 0.5 mg into the skin once a week.   polyethylene glycol 17 g packet Commonly known as: MiraLax Take 17 g by mouth daily as needed for mild constipation or moderate  constipation.   senna 8.6 MG Tabs tablet Commonly known as: SENOKOT Take 2 tablets (17.2 mg total) by mouth at bedtime for 15 days.   sulfamethoxazole-trimethoprim 800-160 MG tablet Commonly known as: BACTRIM DS Take 1 tablet by mouth 2 (two) times daily for 4 days.          WEIGHT BEARING   Weight bearing as tolerated with assist device (walker, cane, etc) as directed, use it as long as suggested by your surgeon or therapist, typically at least 4-6 weeks.   EXERCISES  Results after joint replacement surgery are often greatly improved when you follow the  exercise, range of motion and muscle strengthening exercises prescribed by your doctor. Safety measures are also important to protect the joint from further injury. Any time any of these exercises cause you to have increased pain or swelling, decrease what you are doing until you are comfortable again and then slowly increase them. If you have problems or questions, call your caregiver or physical therapist for advice.   Rehabilitation is important following a joint replacement. After just a few days of immobilization, the muscles of the leg can become weakened and shrink (atrophy).  These exercises are designed to build up the tone and strength of the thigh and leg muscles and to improve motion. Often times heat used for twenty to thirty minutes before working out will loosen up your tissues and help with improving the range of motion but do not use heat for the first two weeks following surgery (sometimes heat can increase post-operative swelling).   These exercises can be done on a training (exercise) mat, on the floor, on a table or on a bed. Use whatever works the best and is most comfortable for you.    Use music or television while you are exercising so that the exercises are a pleasant break in your day. This will make your life better with the exercises acting as a break in your routine that you can look forward to.   Perform all  exercises about fifteen times, three times per day or as directed.  You should exercise both the operative leg and the other leg as well.  Exercises include:   Quad Sets - Tighten up the muscle on the front of the thigh (Quad) and hold for 5-10 seconds.   Straight Leg Raises - With your knee straight (if you were given a brace, keep it on), lift the leg to 60 degrees, hold for 3 seconds, and slowly lower the leg.  Perform this exercise against resistance later as your leg gets stronger.  Leg Slides: Lying on your back, slowly slide your foot toward your buttocks, bending your knee up off the floor (only go as far as is comfortable). Then slowly slide your foot back down until your leg is flat on the floor again.  Angel Wings: Lying on your back spread your legs to the side as far apart as you can without causing discomfort.  Hamstring Strength:  Lying on your back, push your heel against the floor with your leg straight by tightening up the muscles of your buttocks.  Repeat, but this time bend your knee to a comfortable angle, and push your heel against the floor.  You may put a pillow under the heel to make it more comfortable if necessary.   A rehabilitation program following joint replacement surgery can speed recovery and prevent re-injury in the future due to weakened muscles. Contact your doctor or a physical therapist for more information on knee rehabilitation.    CONSTIPATION  Constipation is defined medically as fewer than three stools per week and severe constipation as less than one stool per week.  Even if you have a regular bowel pattern at home, your normal regimen is likely to be disrupted due to multiple reasons following surgery.  Combination of anesthesia, postoperative narcotics, change in appetite and fluid intake all can affect your bowels.   YOU MUST use at least one of the following options; they are listed in order of increasing strength to get the job done.  They are all  available over the counter,  and you may need to use some, POSSIBLY even all of these options:    Drink plenty of fluids (prune juice may be helpful) and high fiber foods Colace 100 mg by mouth twice a day  Senokot for constipation as directed and as needed Dulcolax (bisacodyl), take with full glass of water  Miralax (polyethylene glycol) once or twice a day as needed.  If you have tried all these things and are unable to have a bowel movement in the first 3-4 days after surgery call either your surgeon or your primary doctor.    If you experience loose stools or diarrhea, hold the medications until you stool forms back up.  If your symptoms do not get better within 1 week or if they get worse, check with your doctor.  If you experience "the worst abdominal pain ever" or develop nausea or vomiting, please contact the office immediately for further recommendations for treatment.   ITCHING:  If you experience itching with your medications, try taking only a single pain pill, or even half a pain pill at a time.  You can also use Benadryl over the counter for itching or also to help with sleep.   TED HOSE STOCKINGS:  Use stockings on both legs until for at least 2 weeks or as directed by physician office. They may be removed at night for sleeping.  MEDICATIONS:  See your medication summary on the "After Visit Summary" that nursing will review with you.  You may have some home medications which will be placed on hold until you complete the course of blood thinner medication.  It is important for you to complete the blood thinner medication as prescribed.  PRECAUTIONS:  If you experience chest pain or shortness of breath - call 911 immediately for transfer to the hospital emergency department.   If you develop a fever greater that 101 F, purulent drainage from wound, increased redness or drainage from wound, foul odor from the wound/dressing, or calf pain - CONTACT YOUR SURGEON.                                                    FOLLOW-UP APPOINTMENTS:  If you do not already have a post-op appointment, please call the office for an appointment to be seen by your surgeon.  Guidelines for how soon to be seen are listed in your "After Visit Summary", but are typically between 1-4 weeks after surgery.  OTHER INSTRUCTIONS:   Knee Replacement:  Do not place pillow under knee, focus on keeping the knee straight while resting. CPM instructions: 0-90 degrees, 2 hours in the morning, 2 hours in the afternoon, and 2 hours in the evening. Place foam block, curve side up under heel at all times except when in CPM or when walking.  DO NOT modify, tear, cut, or change the foam block in any way.   MAKE SURE YOU:  Understand these instructions.  Get help right away if you are not doing well or get worse.    Thank you for letting us be a part of your medical care team.  It is a privilege we respect greatly.  We hope these instructions will help you stay on track for a fast and full recovery!   Diagnostic Studies: NM Pulmonary Perfusion  Result Date: 12/02/2021 CLINICAL DATA:  Pulmonary embolism suspected, high prob  EXAM: NUCLEAR MEDICINE PERFUSION LUNG SCAN TECHNIQUE: Perfusion images were obtained in multiple projections after intravenous injection of radiopharmaceutical. Ventilation scans intentionally deferred if perfusion scan and chest x-ray adequate for interpretation during COVID 19 epidemic. RADIOPHARMACEUTICALS:  4.04 mCi Tc-3330m MAA IV COMPARISON:  Chest radiograph from today FINDINGS: There is a uniform distribution of the radiopharmaceutical in both lungs. No significant peripheral segmental perfusion defects to suggest an acute pulmonary embolus. IMPRESSION: 1. No evidence for acute pulmonary embolus. Electronically Signed   By: Signa Kellaylor  Stroud M.D.   On: 12/02/2021 14:31   DG CHEST PORT 1 VIEW  Result Date: 12/02/2021 CLINICAL DATA:  Tachycardia. EXAM: PORTABLE CHEST 1 VIEW COMPARISON:  03/01/2018.  FINDINGS: The heart size and mediastinal contours are within normal limits. No consolidation, effusion, or pneumothorax. No acute osseous abnormality. IMPRESSION: No active disease. Electronically Signed   By: Thornell SartoriusLaura  Taylor M.D.   On: 12/02/2021 00:59   DG Pelvis Portable  Result Date: 12/01/2021 CLINICAL DATA:  Status post left hip arthroplasty EXAM: PORTABLE PELVIS 1-2 VIEWS COMPARISON:  10/21/2019 FINDINGS: There is interval left hip arthroplasty. There are pockets of air in the soft tissues from recent surgery. Severe degenerative changes are noted in right hip with interval progression. IMPRESSION: Status post left hip arthroplasty. Severe degenerative changes are noted in right hip. Electronically Signed   By: Ernie AvenaPalani  Rathinasamy M.D.   On: 12/01/2021 12:24   DG HIP UNILAT WITH PELVIS 1V LEFT  Result Date: 12/01/2021 CLINICAL DATA:  Left hip replacement. EXAM: DG HIP (WITH OR WITHOUT PELVIS) 1V*L*; DG C-ARM 1-60 MIN-NO REPORT Radiation exposure index: 2.4016 mGy. COMPARISON:  None Available. FINDINGS: Two intraoperative fluoroscopic images were obtained of the left hip. The left femoral and acetabular components are well situated. IMPRESSION: Fluoroscopic guidance provided during left total hip arthroplasty. Electronically Signed   By: Lupita RaiderJames  Green Jr M.D.   On: 12/01/2021 11:34   DG C-Arm 1-60 Min-No Report  Result Date: 12/01/2021 Fluoroscopy was utilized by the requesting physician.  No radiographic interpretation.   DG C-Arm 1-60 Min-No Report  Result Date: 12/01/2021 Fluoroscopy was utilized by the requesting physician.  No radiographic interpretation.    Disposition: Discharge disposition: 01-Home or Self Care       Discharge Instructions     Call MD / Call 911   Complete by: As directed    If you experience chest pain or shortness of breath, CALL 911 and be transported to the hospital emergency room.  If you develope a fever above 101 F, pus (white drainage) or increased  drainage or redness at the wound, or calf pain, call your surgeon's office.   Constipation Prevention   Complete by: As directed    Drink plenty of fluids.  Prune juice may be helpful.  You may use a stool softener, such as Colace (over the counter) 100 mg twice a day.  Use MiraLax (over the counter) for constipation as needed.   Diet - low sodium heart healthy   Complete by: As directed    Driving restrictions   Complete by: As directed    No driving for 4 weeks   Increase activity slowly as tolerated   Complete by: As directed    Lifting restrictions   Complete by: As directed    No lifting for 6 weeks   Post-operative opioid taper instructions:   Complete by: As directed    POST-OPERATIVE OPIOID TAPER INSTRUCTIONS: It is important to wean off of your opioid medication as soon as  possible. If you do not need pain medication after your surgery it is ok to stop day one. Opioids include: Codeine, Hydrocodone(Norco, Vicodin), Oxycodone(Percocet, oxycontin) and hydromorphone amongst others.  Long term and even short term use of opiods can cause: Increased pain response Dependence Constipation Depression Respiratory depression And more.  Withdrawal symptoms can include Flu like symptoms Nausea, vomiting And more Techniques to manage these symptoms Hydrate well Eat regular healthy meals Stay active Use relaxation techniques(deep breathing, meditating, yoga) Do Not substitute Alcohol to help with tapering If you have been on opioids for less than two weeks and do not have pain than it is ok to stop all together.  Plan to wean off of opioids This plan should start within one week post op of your joint replacement. Maintain the same interval or time between taking each dose and first decrease the dose.  Cut the total daily intake of opioids by one tablet each day Next start to increase the time between doses. The last dose that should be eliminated is the evening dose.      TED  hose   Complete by: As directed    Use stockings (TED hose) for 2 weeks on both leg(s).  You may remove them at night for sleeping.        Follow-up Information     Swinteck, Arlys John, MD Follow up in 2 week(s).   Specialty: Orthopedic Surgery Why: For suture removal, For wound re-check Contact information: [address] [place] [address] [phone]                  Signed: Clois Dupes, PA-C 12/05/2021, 12:47 PM

## 2021-12-05 NOTE — Telephone Encounter (Signed)
Medication Refill - Medication: carvedilol (COREG) 6.25 MG tablet   Has the patient contacted their pharmacy? Yes.   (Agent: If no, request that the patient contact the pharmacy for the refill. If patient does not wish to contact the pharmacy document the reason why and proceed with request.) (Agent: If yes, when and what did the pharmacy advise?)  Preferred Pharmacy (with phone number or street name): [id number] - [place], [place] - [address] Phone: [phone]  Fax: [phone]   Has the patient been seen for an appointment in the last year OR does the patient have an upcoming appointment? Yes.    Agent: Please be advised that RX refills may take up to 3 business days. We ask that you follow-up with your pharmacy.

## 2021-12-06 MED ORDER — CARVEDILOL 6.25 MG PO TABS: 6.2500 mg | ORAL_TABLET | Freq: Two times a day (BID) | ORAL | 1 refills | Status: DC

## 2021-12-06 NOTE — Telephone Encounter (Signed)
Refill for Coreg has been sent to his pharmacy.  If his orthopedic did not complete orders for [facility] services we can proceed with orders for him.  Thank you

## 2021-12-06 NOTE — Telephone Encounter (Signed)
Rx 07/06/21 #180 1RF- too soon Requested Prescriptions  Pending Prescriptions Disp Refills   carvedilol (COREG) 6.25 MG tablet 180 tablet 1    Sig: Take 1 tablet (6.25 mg total) by mouth 2 (two) times daily with a meal.     Cardiovascular: Beta Blockers 3 Failed - 12/05/2021  1:11 PM      Failed - Last BP in normal range    BP Readings from Last 1 Encounters:  12/04/21 (!) 123/94         Passed - Cr in normal range and within 360 days    Creat  Date Value Ref Range Status  04/26/2021 0.84 0.60 - 1.29 mg/dL Final   Creatinine, Ser  Date Value Ref Range Status  12/04/2021 0.69 0.61 - 1.24 mg/dL Final         Passed - AST in normal range and within 360 days    AST  Date Value Ref Range Status  11/22/2021 17 15 - 41 U/L Final         Passed - ALT in normal range and within 360 days    ALT  Date Value Ref Range Status  11/22/2021 11 0 - 44 U/L Final         Passed - Last Heart Rate in normal range    Pulse Readings from Last 1 Encounters:  12/04/21 (!) 108         Passed - Valid encounter within last 6 months    Recent Outpatient Visits           2 months ago Pre-op evaluation   [facility] [place], [place], MD   5 months ago Prediabetes   [facility] [place], [place], MD   8 months ago Impetigo   [facility] [place], Odette Horns, MD   9 months ago Prediabetes   [facility] [place], Odette Horns, MD   1 year ago Essential hypertension   [facility] Hoy Register, MD       Future Appointments             In 1 month Hoy Register, MD [facility]

## 2021-12-06 NOTE — Telephone Encounter (Signed)
    I called the patient and informed him that the prescription for Coreg [facility] and he said he will call to check on it.   Regarding PCS referral, he does not have Medicaid for PCS;  but he has help at home to assist him.

## 2021-12-07 ENCOUNTER — Other Ambulatory Visit: Payer: Self-pay

## 2021-12-07 LAB — CULTURE, BLOOD (ROUTINE X 2)
Culture: NO GROWTH
Culture: NO GROWTH
Special Requests: ADEQUATE
Special Requests: ADEQUATE

## 2021-12-12 ENCOUNTER — Other Ambulatory Visit: Payer: Self-pay

## 2021-12-19 DIAGNOSIS — Z471 Aftercare following joint replacement surgery: Secondary | ICD-10-CM | POA: Diagnosis not present

## 2021-12-19 DIAGNOSIS — Z96642 Presence of left artificial hip joint: Secondary | ICD-10-CM | POA: Diagnosis not present

## 2022-01-09 ENCOUNTER — Other Ambulatory Visit: Payer: Self-pay

## 2022-01-09 ENCOUNTER — Ambulatory Visit: Payer: BC Managed Care – PPO | Attending: Family Medicine | Admitting: Family Medicine

## 2022-01-09 ENCOUNTER — Encounter: Payer: Self-pay | Admitting: Family Medicine

## 2022-01-09 VITALS — BP 113/76 | HR 99 | Temp 98.1°F | Ht 68.0 in | Wt 248.2 lb

## 2022-01-09 DIAGNOSIS — R7303 Prediabetes: Secondary | ICD-10-CM | POA: Diagnosis not present

## 2022-01-09 DIAGNOSIS — Z23 Encounter for immunization: Secondary | ICD-10-CM

## 2022-01-09 DIAGNOSIS — Z720 Tobacco use: Secondary | ICD-10-CM

## 2022-01-09 DIAGNOSIS — M160 Bilateral primary osteoarthritis of hip: Secondary | ICD-10-CM | POA: Diagnosis not present

## 2022-01-09 DIAGNOSIS — Z1211 Encounter for screening for malignant neoplasm of colon: Secondary | ICD-10-CM

## 2022-01-09 DIAGNOSIS — F17210 Nicotine dependence, cigarettes, uncomplicated: Secondary | ICD-10-CM

## 2022-01-09 DIAGNOSIS — I10 Essential (primary) hypertension: Secondary | ICD-10-CM

## 2022-01-09 MED ORDER — OZEMPIC (0.25 OR 0.5 MG/DOSE) 2 MG/3ML ~~LOC~~ SOPN
0.2500 mg | PEN_INJECTOR | SUBCUTANEOUS | 6 refills | Status: DC
  Filled 2022-01-09: qty 3, 30d supply, fill #0
  Filled 2022-02-17: qty 3, 30d supply, fill #1
  Filled 2022-03-21: qty 3, 30d supply, fill #2
  Filled 2022-06-08 – 2022-06-09 (×2): qty 3, 30d supply, fill #3

## 2022-01-09 NOTE — Progress Notes (Signed)
Subjective:  Patient ID: Daniel [name], male    DOB: 01/29/1970  Age: 51 y.o. MRN: [id number]  CC: Hypertension   HPI SEWELL PITNER is a 52 y.o. year old male with a history of Previous alcohol abuse, hypertension, pulmonary embolism (in 02/2018; completed course of anticoagulation) prediabetes (A1c 5.9), bilateral hip osteoarthritis, status post left THA (in 11/2021), nicotine dependence (smokes 1 pack of cigarettes over the 1 week, less than 20-pack-year)  Interval History:  He is doing well post left THA and is being worked up for right THA.  He Complains of this current dose of Ozempic causing decreased appetite which she did not experience with the 0.25 mg. He endorses adherence with his antihypertensive and denies adverse effects from his medications. Denies additional concerns today.   Past Medical History:  Diagnosis Date   Anxiety    Arthritis    Asthma    Hypertension    Pre-diabetes     Past Surgical History:  Procedure Laterality Date   NO PAST SURGERIES     TOTAL HIP ARTHROPLASTY Left 12/01/2021   Procedure: TOTAL HIP ARTHROPLASTY ANTERIOR APPROACH;  Surgeon: Rod Can, MD;  Location: [facility];  Service: Orthopedics;  Laterality: Left;   WISDOM TOOTH EXTRACTION      Family History  Problem Relation Age of Onset   Heart attack Mother     Social History   Socioeconomic History   Marital status: Divorced    Spouse name: Not on file   Number of children: Not on file   Years of education: Not on file   Highest education level: Not on file  Occupational History   Not on file  Tobacco Use   Smoking status: Some Days    Packs/day: 0.30    Types: Cigarettes   Smokeless tobacco: Never  Vaping Use   Vaping Use: Never used  Substance and Sexual Activity   Alcohol use: Not Currently   Drug use: Not Currently   Sexual activity: Yes    Birth control/protection: None    Comment: Married  Other Topics Concern   Not on file  Social History Narrative    ** Merged History Encounter **       Social Determinants of Health   Financial Resource Strain: Not on file  Food Insecurity: Food Insecurity Present (12/01/2021)   Hunger Vital Sign    Worried About Running Out of Food in the Last Year: Often true    Ran Out of Food in the Last Year: Never true  Transportation Needs: No Transportation Needs (12/01/2021)   PRAPARE - Hydrologist (Medical): No    Lack of Transportation (Non-Medical): No  Physical Activity: Not on file  Stress: Not on file  Social Connections: Not on file    No Known Allergies  Outpatient Medications Prior to Visit  Medication Sig Dispense Refill   acetaminophen (TYLENOL) 500 MG tablet Take 1,000 mg by mouth every 6 (six) hours as needed for mild pain or moderate pain.     albuterol (VENTOLIN HFA) 108 (90 Base) MCG/ACT inhaler Inhale 1 puff into the lungs every 6 (six) hours as needed for wheezing or shortness of breath. 8.5 g 0   allopurinol (ZYLOPRIM) 300 MG tablet TAKE 1 TABLET BY MOUTH 2 TIMES DAILY. 180 tablet 1   amLODipine (NORVASC) 5 MG tablet TAKE 1 TABLET BY MOUTH EVERY DAY 90 tablet 0   carvedilol (COREG) 6.25 MG tablet Take 1 tablet (6.25 mg total)  by mouth 2 (two) times daily with a meal. 180 tablet 1   hydrochlorothiazide (HYDRODIURIL) 25 MG tablet TAKE 1 TABLET (25 MG TOTAL) BY MOUTH DAILY. 90 tablet 1   methocarbamol (ROBAXIN) 500 MG tablet Take 1 tablet (500 mg total) by mouth every 6 (six) hours as needed for muscle spasms. 20 tablet 0   ondansetron (ZOFRAN) 4 MG tablet Take 1 tablet (4 mg total) by mouth every 8 (eight) hours as needed for nausea or vomiting. 30 tablet 0   oxyCODONE (ROXICODONE) 5 MG immediate release tablet Take 1-2 tablets (5-10 mg total) by mouth every 4 (four) hours as needed for severe pain. 42 tablet 0   Semaglutide,0.25 or 0.5MG/DOS, (OZEMPIC, 0.25 OR 0.5 MG/DOSE,) 2 MG/3ML SOPN Inject 0.5 mg into the skin once a week. 3 mL 1   apixaban (ELIQUIS)  2.5 MG TABS tablet Take 1 tablet (2.5 mg total) by mouth 2 (two) times daily. (Patient not taking: Reported on 01/09/2022) 60 tablet 0   No facility-administered medications prior to visit.     ROS Review of Systems  Constitutional:  Negative for activity change and appetite change.  HENT:  Negative for sinus pressure and sore throat.   Respiratory:  Negative for chest tightness, shortness of breath and wheezing.   Cardiovascular:  Negative for chest pain and palpitations.  Gastrointestinal:  Negative for abdominal distention, abdominal pain and constipation.  Genitourinary: Negative.   Musculoskeletal:  Positive for gait problem.       Bilateral hip pain  Psychiatric/Behavioral:  Negative for behavioral problems and dysphoric mood.     Objective:  BP 113/76   Pulse 99   Temp 98.1 F (36.7 C) (Oral)   Ht _0  (1.727 m)   Wt 248 lb 3.2 oz (112.6 kg)   SpO2 99%   BMI 37.74 kg/m      01/09/2022    8:48 AM 12/04/2021    8:52 AM 12/04/2021    4:45 AM  BP/Weight  Systolic BP 160 737 106  Diastolic BP 76 94 74  Wt. (Lbs) 248.2    BMI 37.74 kg/m2        Physical Exam Constitutional:      Appearance: He is well-developed.  Cardiovascular:     Rate and Rhythm: Normal rate.     Heart sounds: Normal heart sounds. No murmur heard. Pulmonary:     Effort: Pulmonary effort is normal.     Breath sounds: Normal breath sounds. No wheezing or rales.  Chest:     Chest wall: No tenderness.  Abdominal:     General: Bowel sounds are normal. There is no distension.     Palpations: Abdomen is soft. There is no mass.     Tenderness: There is no abdominal tenderness.  Musculoskeletal:     Right lower leg: No edema.     Left lower leg: No edema.     Comments: Tenderness on range of motion of both hips  Neurological:     Mental Status: He is alert and oriented to person, place, and time.     Gait: Gait abnormal (ambulates with a cane).  Psychiatric:        Mood and Affect: Mood  normal.        Latest Ref Rng & Units 12/04/2021    9:15 AM 12/03/2021    9:21 AM 12/02/2021    3:36 AM  CMP  Glucose 70 - 99 mg/dL 163  146  121   BUN 6 - 20  mg/dL _0 Creatinine 0.61 - 1.24 mg/dL 0.69  0.80  1.34   Sodium 135 - 145 mmol/L 131  131  134   Potassium 3.5 - 5.1 mmol/L 3.4  3.3  3.3   Chloride 98 - 111 mmol/L 96  98  101   CO2 22 - 32 mmol/L _1 Calcium 8.9 - 10.3 mg/dL 8.0  7.9  7.8     Lipid Panel     Component Value Date/Time   CHOL 162 07/06/2021 1150   TRIG 88 07/06/2021 1150   HDL 39 (L) 07/06/2021 1150   CHOLHDL 3.8 08/11/2020 1056   LDLCALC 106 (H) 07/06/2021 1150    CBC    Component Value Date/Time   WBC 12.4 (H) 12/03/2021 0429   RBC 3.35 (L) 12/03/2021 0429   HGB 10.4 (L) 12/03/2021 0429   HGB 15.0 05/28/2019 1622   HCT 30.4 (L) 12/03/2021 0429   HCT 43.2 05/28/2019 1622   PLT 230 12/03/2021 0429   PLT 306 05/28/2019 1622   MCV 90.7 12/03/2021 0429   MCV 96 05/28/2019 1622   MCH 31.0 12/03/2021 0429   MCHC 34.2 12/03/2021 0429   RDW 14.7 12/03/2021 0429   RDW 15.5 (H) 05/28/2019 1622   LYMPHSABS 1.8 05/28/2019 1622   MONOABS 0.9 11/24/2018 1242   EOSABS 0.1 05/28/2019 1622   BASOSABS 0.1 05/28/2019 1622    Lab Results  Component Value Date   HGBA1C 5.9 (H) 11/22/2021    Assessment & Plan:  1. Essential hypertension Controlled Continue current regimen Counseled on blood pressure goal of less than 130/80, low-sodium, DASH diet, medication compliance, 150 minutes of moderate intensity exercise per week. Discussed medication compliance, adverse effects. - LP+Non-HDL Cholesterol - CMP14+EGFR  2. Prediabetes Controlled with A1c of 5.9 Due to decreased appetite I have decreased his Ozempic dose from 0.5 mg to 0.25 mg Continue to work on lifestyle modifications to prevent progression to type 2 diabetes mellitus - Semaglutide,0.25 or 0.5MG/DOS, (OZEMPIC, 0.25 OR 0.5 MG/DOSE,) 2 MG/3ML SOPN; Inject 0.25 mg into the  skin once a week.  Dispense: 3 mL; Refill: 6  3. Screening for colon cancer Due to the fact that he just had hip surgery he will not be able to undergo colonoscopy hence Cologuard has been ordered - Cologuard  4. Tobacco abuse Spent 3 minutes counseling on smoking cessation but he is not ready to quit He has smoked less than 20-year pack history and does not qualify for lung cancer screening  5. Bilateral primary osteoarthritis of hip Status post left THA Being worked up for right THA Fall precaution    Meds ordered this encounter  Medications   Semaglutide,0.25 or 0.5MG/DOS, (OZEMPIC, 0.25 OR 0.5 MG/DOSE,) 2 MG/3ML SOPN    Sig: Inject 0.25 mg into the skin once a week.    Dispense:  3 mL    Refill:  6    Follow-up: Return in about 6 months (around 07/11/2022) for Chronic medical conditions.       Charlott Rakes, MD, FAAFP. [facility] [place], [place]   01/09/2022, 9:18 AM

## 2022-01-09 NOTE — Progress Notes (Signed)
Discuss Ozempic.

## 2022-01-09 NOTE — Patient Instructions (Addendum)
Hip Pain The hip is the joint between the upper legs and the lower pelvis. The bones, cartilage, tendons, and muscles of your hip joint support your body and allow you to move around. Hip pain can range from a minor ache to severe pain in one or both of your hips. The pain may be felt on the inside of the hip joint near the groin, or on the outside near the buttocks and upper thigh. You may also have swelling or stiffness in your hip area. Follow these instructions at home: Managing pain, stiffness, and swelling     If directed, put ice on the painful area. To do this: Put ice in a plastic bag. Place a towel between your skin and the bag. Leave the ice on for 20 minutes, 2-3 times a day. If directed, apply heat to the affected area as often as told by your health care provider. Use the heat source that your health care provider recommends, such as a moist heat pack or a heating pad. Place a towel between your skin and the heat source. Leave the heat on for 20-30 minutes. Remove the heat if your skin turns bright red. This is especially important if you are unable to feel pain, heat, or cold. You may have a greater risk of getting burned. Activity Do exercises as told by your health care provider. Avoid activities that cause pain. General instructions  Take over-the-counter and prescription medicines only as told by your health care provider. Keep a journal of your symptoms. Write down: How often you have hip pain. The location of your pain. What the pain feels like. What makes the pain worse. Sleep with a pillow between your legs on your most comfortable side. Keep all follow-up visits as told by your health care provider. This is important. Contact a health care provider if: You cannot put weight on your leg. Your pain or swelling continues or gets worse after one week. It gets harder to walk. You have a fever. Get help right away if: You fall. You have a sudden increase in pain  and swelling in your hip. Your hip is red or swollen or very tender to touch. Summary Hip pain can range from a minor ache to severe pain in one or both of your hips. The pain may be felt on the inside of the hip joint near the groin, or on the outside near the buttocks and upper thigh. Avoid activities that cause pain. Write down how often you have hip pain, the location of the pain, what makes it worse, and what it feels like. This information is not intended to replace advice given to you by your health care provider. Make sure you discuss any questions you have with your health care provider. Document Revised: 05/27/2018 Document Reviewed: 05/27/2018 Elsevier Patient Education  2023 Elsevier Inc. Influenza (Flu) Vaccine (Inactivated or Recombinant): What You Need to Know 1. Why get vaccinated? Influenza vaccine can prevent influenza (flu). Flu is a contagious disease that spreads around the [place] every year, usually between October and May. Anyone can get the flu, but it is more dangerous for some people. Infants and young children, people 80 years and older, pregnant people, and people with certain health conditions or a weakened immune system are at greatest risk of flu complications. Pneumonia, bronchitis, sinus infections, and ear infections are examples of flu-related complications. If you have a medical condition, such as heart disease, cancer, or diabetes, flu can make it worse. Flu  can cause fever and chills, sore throat, muscle aches, fatigue, cough, headache, and runny or stuffy nose. Some people may have vomiting and diarrhea, though this is more common in children than adults. In an average year, thousands of people in the [place] die from flu, and many more are hospitalized. Flu vaccine prevents millions of illnesses and flu-related visits to the doctor each year. 2. Influenza vaccines CDC recommends everyone 6 months and older get vaccinated every flu season.  Children 6 months through 58 years of age may need 2 doses during a single flu season. Everyone else needs only 1 dose each flu season. It takes about 2 weeks for protection to develop after vaccination. There are many flu viruses, and they are always changing. Each year a new flu vaccine is made to protect against the influenza viruses believed to be likely to cause disease in the upcoming flu season. Even when the vaccine doesn't exactly match these viruses, it may still provide some protection. Influenza vaccine does not cause flu. Influenza vaccine may be given at the same time as other vaccines. 3. Talk with your health care provider Tell your vaccination provider if the person getting the vaccine: Has had an allergic reaction after a previous dose of influenza vaccine, or has any severe, life-threatening allergies Has ever had Guillain-Barr Syndrome (also called "GBS") In some cases, your health care provider may decide to postpone influenza vaccination until a future visit. Influenza vaccine can be administered at any time during pregnancy. People who are or will be pregnant during influenza season should receive inactivated influenza vaccine. People with minor illnesses, such as a cold, may be vaccinated. People who are moderately or severely ill should usually wait until they recover before getting influenza vaccine. Your health care provider can give you more information. 4. Risks of a vaccine reaction Soreness, redness, and swelling where the shot is given, fever, muscle aches, and headache can happen after influenza vaccination. There may be a very small increased risk of Guillain-Barr Syndrome (GBS) after inactivated influenza vaccine (the flu shot). Young children who get the flu shot along with pneumococcal vaccine (PCV13) and/or DTaP vaccine at the same time might be slightly more likely to have a seizure caused by fever. Tell your health care provider if a child who is getting flu  vaccine has ever had a seizure. People sometimes faint after medical procedures, including vaccination. Tell your provider if you feel dizzy or have vision changes or ringing in the ears. As with any medicine, there is a very remote chance of a vaccine causing a severe allergic reaction, other serious injury, or death. 5. What if there is a serious problem? An allergic reaction could occur after the vaccinated person leaves the clinic. If you see signs of a severe allergic reaction (hives, swelling of the face and throat, difficulty breathing, a fast heartbeat, dizziness, or weakness), call 9-1-1 and get the person to the nearest hospital. For other signs that concern you, call your health care provider. Adverse reactions should be reported to the Vaccine Adverse Event Reporting System (VAERS). Your health care provider will usually file this report, or you can do it yourself. Visit the VAERS website at www.vaers.LAgents.no or call (639)429-0757. VAERS is only for reporting reactions, and VAERS staff members do not give medical advice. 6. The National Vaccine Injury Compensation Program The [employer] Vaccine Injury Compensation Program (VICP) is a federal program that was created to compensate people who may have been injured by certain vaccines.  Claims regarding alleged injury or death due to vaccination have a time limit for filing, which may be as short as two years. Visit the VICP website at SpiritualWord.at or call 8738221896 to learn about the program and about filing a claim. 7. How can I learn more? Ask your health care provider. Call your local or state health department. Visit the website of the Food and Drug Administration (FDA) for vaccine package inserts and additional information at FinderList.no. Contact the Centers for Disease Control and Prevention (CDC): Call 501-619-9123 (1-800-CDC-INFO) or Visit CDC's website at  BiotechRoom.com.cy. Source: CDC Vaccine Information Statement Inactivated Influenza Vaccine (08/29/2019) This same material is available at FootballExhibition.com.br for no charge. This information is not intended to replace advice given to you by your health care provider. Make sure you discuss any questions you have with your health care provider. Document Revised: 12/07/2020 Document Reviewed: 09/30/2020 Elsevier Patient Education  2023 [employer].

## 2022-01-10 ENCOUNTER — Other Ambulatory Visit: Payer: Self-pay | Admitting: Family Medicine

## 2022-01-10 DIAGNOSIS — E876 Hypokalemia: Secondary | ICD-10-CM

## 2022-01-10 LAB — CMP14+EGFR
ALT: 7 IU/L (ref 0–44)
AST: 19 IU/L (ref 0–40)
Albumin/Globulin Ratio: 1.4 (ref 1.2–2.2)
Albumin: 4.4 g/dL (ref 4.1–5.1)
Alkaline Phosphatase: 88 IU/L (ref 44–121)
BUN/Creatinine Ratio: 9 (ref 9–20)
BUN: 8 mg/dL (ref 6–24)
Bilirubin Total: 0.4 mg/dL (ref 0.0–1.2)
CO2: 25 mmol/L (ref 20–29)
Calcium: 9.5 mg/dL (ref 8.7–10.2)
Chloride: 96 mmol/L (ref 96–106)
Creatinine, Ser: 0.92 mg/dL (ref 0.76–1.27)
Globulin, Total: 3.1 g/dL (ref 1.5–4.5)
Glucose: 108 mg/dL — ABNORMAL HIGH (ref 70–99)
Potassium: 3.2 mmol/L — ABNORMAL LOW (ref 3.5–5.2)
Sodium: 137 mmol/L (ref 134–144)
Total Protein: 7.5 g/dL (ref 6.0–8.5)
eGFR: 102 mL/min/{1.73_m2} (ref >59)

## 2022-01-10 LAB — LP+NON-HDL CHOLESTEROL
Cholesterol, Total: 160 mg/dL (ref 100–199)
HDL: 37 mg/dL — ABNORMAL LOW (ref >39)
LDL Chol Calc (NIH): 101 mg/dL — ABNORMAL HIGH (ref 0–99)
Total Non-HDL-Chol (LDL+VLDL): 123 mg/dL (ref 0–129)
Triglycerides: 120 mg/dL (ref 0–149)
VLDL Cholesterol Cal: 22 mg/dL (ref 5–40)

## 2022-01-10 MED ORDER — POTASSIUM CHLORIDE ER 10 MEQ PO TBCR: 10.0000 meq | EXTENDED_RELEASE_TABLET | Freq: Every day | ORAL | 5 refills | Status: DC

## 2022-01-17 ENCOUNTER — Other Ambulatory Visit: Payer: Self-pay

## 2022-01-19 DIAGNOSIS — G894 Chronic pain syndrome: Secondary | ICD-10-CM | POA: Diagnosis not present

## 2022-01-24 ENCOUNTER — Ambulatory Visit: Payer: BC Managed Care – PPO | Attending: Family Medicine

## 2022-01-24 DIAGNOSIS — E876 Hypokalemia: Secondary | ICD-10-CM

## 2022-01-24 DIAGNOSIS — Z471 Aftercare following joint replacement surgery: Secondary | ICD-10-CM | POA: Diagnosis not present

## 2022-01-25 LAB — POTASSIUM: Potassium: 3.7 mmol/L (ref 3.5–5.2)

## 2022-02-13 ENCOUNTER — Other Ambulatory Visit: Payer: Self-pay | Admitting: Family Medicine

## 2022-02-13 ENCOUNTER — Ambulatory Visit: Payer: Self-pay

## 2022-02-13 DIAGNOSIS — I10 Essential (primary) hypertension: Secondary | ICD-10-CM

## 2022-02-13 NOTE — Telephone Encounter (Signed)
Pt requesting refill of carvedilol.

## 2022-02-13 NOTE — Telephone Encounter (Signed)
Message from Marlis Edelson sent at 02/13/2022  8:39 AM EST  Summary: surgery concern /  rx req   Marinda Elk would like to speak with a member of clinical staff regarding increasing their Ozempic  The patient's medication had been previously increased for surgery and the patient would like another increase  The patient's wife shares that they have an upcoming surgical procedure  Please contact further when possible         Chief Complaint: wants to go back up to 0.5 mg Ozempic and needs refills also needs refill of carvedilol.  Symptoms: none- wanting to shed pounds before March hip surgery Frequency: n/a Pertinent Negatives: Patient denies n/a Disposition: [] ED /[] Urgent Care (no appt availability in office) / [] Appointment(In office/virtual)/ []  [place] [facility]/ [] Home Care/ [] Refused Recommended Disposition /[] [place] [facility]/ [x]  Follow-up with PCP Additional Notes: routing Ozempic request and will send Carvedilol to [place] [facility]   Reason for Disposition  [1] Prescription refill request for NON-ESSENTIAL medicine (i.e., no harm to patient if med not taken) AND [2] triager unable to refill per department policy  Answer Assessment - Initial Assessment Questions 1. DRUG NAME: "What medicine do you need to have refilled?"     Would like to go back to 0.5 mg of Ozempic and would like refill of cavedilol 2. REFILLS REMAINING: "How many refills are remaining?" (Note: The label on the medicine or pill bottle will show how many refills are remaining. If there are no refills remaining, then a renewal may be needed.)     0 3. EXPIRATION DATE: "What is the expiration date?" (Note: The label states when the prescription will expire, and thus can no longer be refilled.)     N/a 4. PRESCRIBING HCP: "Who prescribed it?" Reason: If prescribed by specialist, call should be referred to that group.     pcp 5. SYMPTOMS: "Do you have any symptoms?"     no 6. PREGNANCY: "Is there any  chance that you are pregnant?" "When was your last menstrual period?"     N/a  Protocols used: Medication Refill and Renewal Call-A-AH

## 2022-02-14 ENCOUNTER — Other Ambulatory Visit: Payer: Self-pay

## 2022-02-14 ENCOUNTER — Telehealth: Payer: Self-pay

## 2022-02-14 DIAGNOSIS — I10 Essential (primary) hypertension: Secondary | ICD-10-CM

## 2022-02-14 MED ORDER — CARVEDILOL 6.25 MG PO TABS
6.2500 mg | ORAL_TABLET | Freq: Two times a day (BID) | ORAL | 1 refills | Status: DC
  Filled 2022-02-14 – 2022-06-09 (×3): qty 60, 30d supply, fill #0
  Filled 2022-07-04: qty 60, 30d supply, fill #1

## 2022-02-14 MED ORDER — AMLODIPINE BESYLATE 5 MG PO TABS
5.0000 mg | ORAL_TABLET | Freq: Every day | ORAL | 0 refills | Status: DC
  Filled 2022-02-14: qty 30, 30d supply, fill #0

## 2022-02-14 MED ORDER — HYDROCHLOROTHIAZIDE 25 MG PO TABS
25.0000 mg | ORAL_TABLET | Freq: Every day | ORAL | 1 refills | Status: DC
  Filled 2022-02-14: qty 30, 30d supply, fill #0

## 2022-02-14 NOTE — Telephone Encounter (Signed)
Reordering carvedilol, amlodipine, HCTZ Called pt and he would like meds sent to [facility] Requested Prescriptions  Pending Prescriptions Disp Refills   hydrochlorothiazide (HYDRODIURIL) 25 MG tablet 90 tablet 1    Sig: Take 1 tablet (25 mg total) by mouth daily.     There is no refill protocol information for this order     amLODipine (NORVASC) 5 MG tablet 90 tablet 0    Sig: Take 1 tablet (5 mg total) by mouth daily.     There is no refill protocol information for this order     carvedilol (COREG) 6.25 MG tablet 180 tablet 1    Sig: Take 1 tablet (6.25 mg total) by mouth 2 (two) times daily with a meal.     There is no refill protocol information for this order

## 2022-02-14 NOTE — Telephone Encounter (Signed)
Requested medication (s) are due for refill today: yes  Requested medication (s) are on the active medication list: yes  Last refill:  08/15/21  Future visit scheduled: yes  Notes to clinic:  Unable to refill per protocol, DX code needed      Requested Prescriptions  Pending Prescriptions Disp Refills   hydrochlorothiazide (HYDRODIURIL) 25 MG tablet [Pharmacy Med Name: HYDROCHLOROTHIAZIDE 25 MG TAB] 90 tablet 1    Sig: TAKE 1 TABLET (25 MG TOTAL) BY MOUTH DAILY.     Cardiovascular: Diuretics - Thiazide Passed - 02/13/2022  9:34 AM      Passed - Cr in normal range and within 180 days    Creat  Date Value Ref Range Status  04/26/2021 0.84 0.60 - 1.29 mg/dL Final   Creatinine, Ser  Date Value Ref Range Status  01/09/2022 0.92 0.76 - 1.27 mg/dL Final         Passed - K in normal range and within 180 days    Potassium  Date Value Ref Range Status  01/24/2022 3.7 3.5 - 5.2 mmol/L Final         Passed - Na in normal range and within 180 days    Sodium  Date Value Ref Range Status  01/09/2022 137 134 - 144 mmol/L Final         Passed - Last BP in normal range    BP Readings from Last 1 Encounters:  01/09/22 113/76         Passed - Valid encounter within last 6 months    Recent Outpatient Visits           1 month ago Essential hypertension   [place], Charlane Ferretti, MD   4 months ago Pre-op evaluation   [place], Enobong, MD   7 months ago Prediabetes   [place], Enobong, MD   11 months ago [place] Charlott Rakes, MD   1 year ago Prediabetes   [place], Enobong, MD       Future Appointments             In 4 months Charlott Rakes, MD [place]             amLODipine (NORVASC) 5 MG tablet [Pharmacy  Med Name: AMLODIPINE BESYLATE 5 MG TAB] 90 tablet 0    Sig: TAKE 1 TABLET BY MOUTH EVERY DAY     Cardiovascular: Calcium Channel Blockers 2 Passed - 02/13/2022  9:34 AM      Passed - Last BP in normal range    BP Readings from Last 1 Encounters:  01/09/22 113/76         Passed - Last Heart Rate in normal range    Pulse Readings from Last 1 Encounters:  01/09/22 99         Passed - Valid encounter within last 6 months    Recent Outpatient Visits           1 month ago Essential hypertension   [place], [place], MD   4 months ago Pre-op evaluation   [place], Enobong, MD   7 months ago Prediabetes   [place] Charlott Rakes, MD   11  months ago [place], [place], MD   1 year ago Prediabetes   [place], Charlane Ferretti, MD       Future Appointments             In 4 months Charlott Rakes, MD [place]             carvedilol (COREG) 6.25 MG tablet 180 tablet 1    Sig: Take 1 tablet (6.25 mg total) by mouth 2 (two) times daily with a meal.     Cardiovascular: Beta Blockers 3 Passed - 02/13/2022  9:34 AM      Passed - Cr in normal range and within 360 days    Creat  Date Value Ref Range Status  04/26/2021 0.84 0.60 - 1.29 mg/dL Final   Creatinine, Ser  Date Value Ref Range Status  01/09/2022 0.92 0.76 - 1.27 mg/dL Final         Passed - AST in normal range and within 360 days    AST  Date Value Ref Range Status  01/09/2022 19 0 - 40 IU/L Final         Passed - ALT in normal range and within 360 days    ALT  Date Value Ref Range Status  01/09/2022 7 0 - 44 IU/L Final         Passed - Last BP in normal range    BP Readings from Last 1 Encounters:  01/09/22 113/76         Passed - Last Heart Rate in  normal range    Pulse Readings from Last 1 Encounters:  01/09/22 99         Passed - Valid encounter within last 6 months    Recent Outpatient Visits           1 month ago Essential hypertension   [place], Charlane Ferretti, MD   4 months ago Pre-op evaluation   [place], Enobong, MD   7 months ago Prediabetes   [place], Enobong, MD   11 months ago [place], Enobong, MD   1 year ago Prediabetes   [place], Enobong, MD       Future Appointments             In 4 months Charlott Rakes, MD [place]

## 2022-02-14 NOTE — Telephone Encounter (Signed)
Called pt to clarify which pharmacy to send rx refills to. Pt wants to send all to [facility].   Requested Prescriptions  Pending Prescriptions Disp Refills   hydrochlorothiazide (HYDRODIURIL) 25 MG tablet [Pharmacy Med Name: HYDROCHLOROTHIAZIDE 25 MG TAB] 90 tablet 1    Sig: TAKE 1 TABLET (25 MG TOTAL) BY MOUTH DAILY.     Cardiovascular: Diuretics - Thiazide Passed - 02/13/2022  9:34 AM      Passed - Cr in normal range and within 180 days    Creat  Date Value Ref Range Status  04/26/2021 0.84 0.60 - 1.29 mg/dL Final   Creatinine, Ser  Date Value Ref Range Status  01/09/2022 0.92 0.76 - 1.27 mg/dL Final         Passed - K in normal range and within 180 days    Potassium  Date Value Ref Range Status  01/24/2022 3.7 3.5 - 5.2 mmol/L Final         Passed - Na in normal range and within 180 days    Sodium  Date Value Ref Range Status  01/09/2022 137 134 - 144 mmol/L Final         Passed - Last BP in normal range    BP Readings from Last 1 Encounters:  01/09/22 113/76         Passed - Valid encounter within last 6 months    Recent Outpatient Visits           1 month ago Essential hypertension   [place], Charlane Ferretti, MD   4 months ago Pre-op evaluation   [place], Enobong, MD   7 months ago Prediabetes   [place], Enobong, MD   11 months ago [place] Charlott Rakes, MD   1 year ago Prediabetes   [place], Enobong, MD       Future Appointments             In 4 months Charlott Rakes, MD [place]             amLODipine (NORVASC) 5 MG tablet [Pharmacy Med Name: AMLODIPINE BESYLATE 5 MG TAB] 90 tablet 0    Sig: TAKE 1 TABLET BY MOUTH EVERY DAY     Cardiovascular: Calcium Channel Blockers  2 Passed - 02/13/2022  9:34 AM      Passed - Last BP in normal range    BP Readings from Last 1 Encounters:  01/09/22 113/76         Passed - Last Heart Rate in normal range    Pulse Readings from Last 1 Encounters:  01/09/22 99         Passed - Valid encounter within last 6 months    Recent Outpatient Visits           1 month ago Essential hypertension   [place], [place], MD   4 months ago Pre-op evaluation   [place], Enobong, MD   7 months ago Prediabetes   [place], Enobong, MD   11 months ago [place], MD   1 year ago Prediabetes   [facility]  [place], Charlane Ferretti, MD       Future Appointments             In 4 months Charlott Rakes, MD [place]             carvedilol (COREG) 6.25 MG tablet 180 tablet 1    Sig: Take 1 tablet (6.25 mg total) by mouth 2 (two) times daily with a meal.     Cardiovascular: Beta Blockers 3 Passed - 02/13/2022  9:34 AM      Passed - Cr in normal range and within 360 days    Creat  Date Value Ref Range Status  04/26/2021 0.84 0.60 - 1.29 mg/dL Final   Creatinine, Ser  Date Value Ref Range Status  01/09/2022 0.92 0.76 - 1.27 mg/dL Final         Passed - AST in normal range and within 360 days    AST  Date Value Ref Range Status  01/09/2022 19 0 - 40 IU/L Final         Passed - ALT in normal range and within 360 days    ALT  Date Value Ref Range Status  01/09/2022 7 0 - 44 IU/L Final         Passed - Last BP in normal range    BP Readings from Last 1 Encounters:  01/09/22 113/76         Passed - Last Heart Rate in normal range    Pulse Readings from Last 1 Encounters:  01/09/22 99         Passed - Valid encounter within last 6 months    Recent  Outpatient Visits           1 month ago Essential hypertension   [place], Charlane Ferretti, MD   4 months ago Pre-op evaluation   [place], Enobong, MD   7 months ago Prediabetes   [place], Enobong, MD   11 months ago [place], Enobong, MD   1 year ago Prediabetes   [place], Enobong, MD       Future Appointments             In 4 months Charlott Rakes, MD [place]

## 2022-02-16 ENCOUNTER — Other Ambulatory Visit: Payer: Self-pay | Admitting: Family Medicine

## 2022-02-16 ENCOUNTER — Other Ambulatory Visit: Payer: Self-pay

## 2022-02-16 DIAGNOSIS — I10 Essential (primary) hypertension: Secondary | ICD-10-CM

## 2022-02-16 DIAGNOSIS — M1A00X0 Idiopathic chronic gout, unspecified site, without tophus (tophi): Secondary | ICD-10-CM

## 2022-02-16 MED ORDER — AMLODIPINE BESYLATE 5 MG PO TABS: 5.0000 mg | ORAL_TABLET | Freq: Every day | ORAL | 0 refills | Status: DC

## 2022-02-16 NOTE — Telephone Encounter (Signed)
[place] to verify if patient picked up medication. Due to insurance payments, patient requesting refills at [facility]. Did  not pick up medication at [place]

## 2022-02-16 NOTE — Telephone Encounter (Signed)
Refills to be sent to different pharmacy . Requested Prescriptions  Pending Prescriptions Disp Refills   hydrochlorothiazide (HYDRODIURIL) 25 MG tablet [Pharmacy Med Name: HYDROCHLOROTHIAZIDE 25 MG TAB] 90 tablet 1    Sig: TAKE 1 TABLET (25 MG TOTAL) BY MOUTH DAILY.     Cardiovascular: Diuretics - Thiazide Passed - 02/16/2022  4:06 PM      Passed - Cr in normal range and within 180 days    Creat  Date Value Ref Range Status  04/26/2021 0.84 0.60 - 1.29 mg/dL Final   Creatinine, Ser  Date Value Ref Range Status  01/09/2022 0.92 0.76 - 1.27 mg/dL Final         Passed - K in normal range and within 180 days    Potassium  Date Value Ref Range Status  01/24/2022 3.7 3.5 - 5.2 mmol/L Final         Passed - Na in normal range and within 180 days    Sodium  Date Value Ref Range Status  01/09/2022 137 134 - 144 mmol/L Final         Passed - Last BP in normal range    BP Readings from Last 1 Encounters:  01/09/22 113/76         Passed - Valid encounter within last 6 months    Recent Outpatient Visits           1 month ago Essential hypertension   [place], Charlane Ferretti, MD   4 months ago Pre-op evaluation   [place], [place], MD   7 months ago Prediabetes   [place], Charlane Ferretti, MD   11 months ago [place], Charlane Ferretti, MD   1 year ago Prediabetes   [place], Charlane Ferretti, MD       Future Appointments             In 4 months Charlott Rakes, MD [place]             allopurinol (ZYLOPRIM) 300 MG tablet [Pharmacy Med Name: ALLOPURINOL 300 MG TABLET] 180 tablet 1    Sig: TAKE 1 TABLET BY [place] A DAY     Endocrinology:  Gout Agents - allopurinol Failed - 02/16/2022  4:06 PM      Failed - Uric Acid in normal  range and within 360 days    Uric Acid  Date Value Ref Range Status  05/28/2019 10.8 (H) 3.8 - 8.4 mg/dL Final    Comment:               Therapeutic target for gout patients: <6.0         Failed - CBC within normal limits and completed in the last 12 months    WBC  Date Value Ref Range Status  12/03/2021 12.4 (H) 4.0 - 10.5 K/uL Final   RBC  Date Value Ref Range Status  12/03/2021 3.35 (L) 4.22 - 5.81 MIL/uL Final   Hemoglobin  Date Value Ref Range Status  12/03/2021 10.4 (L) 13.0 - 17.0 g/dL Final  05/28/2019 15.0 13.0 - 17.7 g/dL Final   HCT  Date Value Ref Range Status  12/03/2021 30.4 (L) 39.0 - 52.0 % Final   Hematocrit  Date Value Ref Range Status  05/28/2019 43.2 37.5 - 51.0 % Final  MCHC  Date Value Ref Range Status  12/03/2021 34.2 30.0 - 36.0 g/dL Final   [facility]  Date Value Ref Range Status  12/03/2021 31.0 26.0 - 34.0 pg Final   MCV  Date Value Ref Range Status  12/03/2021 90.7 80.0 - 100.0 fL Final  05/28/2019 96 79 - 97 fL Final   No results found for: "PLTCOUNTKUC", "LABPLAT", "POCPLA" RDW  Date Value Ref Range Status  12/03/2021 14.7 11.5 - 15.5 % Final  05/28/2019 15.5 (H) 11.6 - 15.4 % Final         Passed - Cr in normal range and within 360 days    Creat  Date Value Ref Range Status  04/26/2021 0.84 0.60 - 1.29 mg/dL Final   Creatinine, Ser  Date Value Ref Range Status  01/09/2022 0.92 0.76 - 1.27 mg/dL Final         Passed - Valid encounter within last 12 months    Recent Outpatient Visits           1 month ago Essential hypertension   [place], Enobong, MD   4 months ago Pre-op evaluation   [place], Enobong, MD   7 months ago Prediabetes   [place], Enobong, MD   11 months ago [place] Charlott Rakes, MD   1 year ago Prediabetes   [place], Charlane Ferretti, MD       Future Appointments             In 4 months Charlott Rakes, MD [place]             amLODipine (NORVASC) 5 MG tablet 90 tablet 0    Sig: Take 1 tablet (5 mg total) by mouth daily.     Cardiovascular: Calcium Channel Blockers 2 Passed - 02/16/2022  4:06 PM      Passed - Last BP in normal range    BP Readings from Last 1 Encounters:  01/09/22 113/76         Passed - Last Heart Rate in normal range    Pulse Readings from Last 1 Encounters:  01/09/22 99         Passed - Valid encounter within last 6 months    Recent Outpatient Visits           1 month ago Essential hypertension   [place], [place], MD   4 months ago Pre-op evaluation   [place], Enobong, MD   7 months ago Prediabetes   [place], Enobong, MD   11 months ago [place], MD   1 year ago Prediabetes   [place], MD       Future Appointments             In 4 months Charlott Rakes, MD [place]

## 2022-02-17 ENCOUNTER — Other Ambulatory Visit: Payer: Self-pay

## 2022-02-20 ENCOUNTER — Other Ambulatory Visit: Payer: Self-pay

## 2022-03-10 NOTE — Progress Notes (Signed)
Sent message, via epic in basket, requesting orders in epic from surgeon.  

## 2022-03-11 ENCOUNTER — Ambulatory Visit: Payer: Self-pay | Admitting: Student

## 2022-03-14 NOTE — Progress Notes (Signed)
Anesthesia Review:  PCP: Charlott Rakes [place] 01/09/22  Clearance on chart form [facility] MD dated 02/03/22  Cardiologist : Chest x-ray : 11/10/.23- 1 view  EKG : 12/01/21  Echo : 2020  Stress test: Cardiac Cath :  Activity level:  Sleep Study/ CPAP : Fasting Blood Sugar :      / Checks Blood Sugar -- times a day:   Blood Thinner/ Instructions /Last Dose: ASA / Instructions/ Last Dose :

## 2022-03-14 NOTE — Patient Instructions (Addendum)
SURGICAL WAITING ROOM VISITATION  Patients having surgery or a procedure may have no more than 2 support people in the waiting area - these visitors may rotate.    Children under the age of 72 must have an adult with them who is not the patient.  Due to an increase in RSV and influenza rates and associated hospitalizations, children ages 98 and under may not visit patients in [place].  If the patient needs to stay at the hospital during part of their recovery, the visitor guidelines for inpatient rooms apply. Pre-op nurse will coordinate an appropriate time for 1 support person to accompany patient in pre-op.  This support person may not rotate.    Please refer to the [facility] website for the visitor guidelines for Inpatients (after your surgery is over and you are in a regular room).       Your procedure is scheduled on: 03/29/22    Report to [facility] Main Entrance    Report to admitting at  11:15 AM   Call this number if you have problems the morning of surgery [phone]   Do not eat food :After Midnight.   After Midnight you may have the following liquids until _ 1045_____ AM DAY OF SURGERY  Water Non-Citrus Juices (without pulp, NO RED-Apple, White grape, White cranberry) Black Coffee (NO MILK/CREAM OR CREAMERS, sugar ok)  Clear Tea (NO MILK/CREAM OR CREAMERS, sugar ok) regular and decaf                             Plain Jell-O (NO RED)                                           Fruit ices (not with fruit pulp, NO RED)                                     Popsicles (NO RED)                                                               Sports drinks like Gatorade (NO RED)                    The day of surgery:  Drink ONE (1) Pre-Surgery Clear Ensure or G2 at  10:30 AM ( have completed by )  the morning of surgery. Drink in one sitting. Do not sip.  This drink was given to you during your hospital  pre-op appointment visit. Nothing else to  drink after completing the  Pre-Surgery Clear Ensure or G2. At 10:45          If you have questions, please contact your surgeon's office.    Oral Hygiene is also important to reduce your risk of infection.                                    Remember - BRUSH YOUR TEETH THE MORNING OF SURGERY WITH YOUR REGULAR  TOOTHPASTE   Do NOT smoke after Midnight   Take these medicines the morning of surgery with A SIP OF WATER:  inhalers as usual and bring,                                                                                                                             allopurinol, amlodpine, coreg                                                                                                                             Pain med if needed   Hold Ozempic for 7 days prior to surgery.  None after wed 2/28                                You may not have any metal on your body including  jewelry, and body piercing             Do not wear lotions, powders, perfumes/cologne, or deodorant               Men may shave face and neck.   Do not bring valuables to the hospital. [place].   Contacts, glasses, dentures or bridgework may not be worn into surgery.   Bring small overnight bag day of surgery.   DO NOT [place]. PHARMACY WILL DISPENSE MEDICATIONS LISTED ON YOUR MEDICATION LIST TO YOU DURING YOUR ADMISSION [place]!                   Please read over the following fact sheets you were given: IF [place] [phone]   If you received a COVID test during your pre-op visit  it is requested that you wear a mask when out in public, stay away from anyone that may not be feeling well and notify your surgeon if you develop symptoms. If you test positive for Covid or have been in contact with anyone that has tested positive in the last 10 days  please notify you surgeon.    [facility] - Preparing for Surgery Before surgery, you can play an important role.  Because skin is not sterile, your skin needs to  be as free of germs as possible.  You can reduce the number of germs on your skin by washing with CHG (chlorahexidine gluconate) soap before surgery.  CHG is an antiseptic cleaner which kills germs and bonds with the skin to continue killing germs even after washing. Please DO NOT use if you have an allergy to CHG or antibacterial soaps.  If your skin becomes reddened/irritated stop using the CHG and inform your nurse when you arrive at Short Stay. You may shave your face/neck. Please follow these instructions carefully:  1.  Shower with CHG Soap the night before surgery and the  morning of Surgery.  2.  If you choose to wash your hair, wash your hair first as usual with your  normal  shampoo.  3.  After you shampoo, rinse your hair and body thoroughly to remove the  shampoo.                            4.  Use CHG as you would any other liquid soap.  You can apply chg directly  to the skin and wash                       Gently with a scrungie or clean washcloth.  5.  Apply the CHG Soap to your body ONLY FROM THE NECK DOWN.   Do not use on face/ open                           Wound or open sores. Avoid contact with eyes, ears mouth and genitals (private parts).                       Wash face,  Genitals (private parts) with your normal soap.             6.  Wash thoroughly, paying special attention to the area where your surgery  will be performed.  7.  Thoroughly rinse your body with warm water from the neck down.  8.  DO NOT shower/wash with your normal soap after using and rinsing off  the CHG Soap.             9.  Pat yourself dry with a clean towel.            10.  Wear clean pajamas.            11.  Place clean sheets on your bed the night of your first shower and do not  sleep with pets. Day of Surgery : Do not apply any  lotions/deodorants the morning of surgery.  Please wear clean clothes to the hospital/surgery center.  FAILURE TO FOLLOW THESE INSTRUCTIONS MAY RESULT IN THE CANCELLATION OF YOUR SURGERY  PATIENT SIGNATURE_________________________________    ________________________________________________________________________

## 2022-03-17 ENCOUNTER — Encounter (HOSPITAL_COMMUNITY): Payer: Self-pay

## 2022-03-17 ENCOUNTER — Encounter (HOSPITAL_COMMUNITY)
Admission: RE | Admit: 2022-03-17 | Discharge: 2022-03-17 | Disposition: A | Discharge status: Home / Self Care | Payer: BC Managed Care – PPO | Source: Ambulatory Visit | Attending: Orthopedic Surgery | Admitting: Orthopedic Surgery

## 2022-03-17 ENCOUNTER — Other Ambulatory Visit: Payer: Self-pay

## 2022-03-17 DIAGNOSIS — R7303 Prediabetes: Secondary | ICD-10-CM | POA: Diagnosis not present

## 2022-03-17 DIAGNOSIS — Z01812 Encounter for preprocedural laboratory examination: Secondary | ICD-10-CM | POA: Diagnosis not present

## 2022-03-17 DIAGNOSIS — Z01818 Encounter for other preprocedural examination: Secondary | ICD-10-CM

## 2022-03-17 HISTORY — DX: Dyspnea, unspecified: R06.00

## 2022-03-17 HISTORY — DX: Gout, unspecified: M10.9

## 2022-03-17 LAB — BASIC METABOLIC PANEL
Anion gap: 11 (ref 5–15)
BUN: 11 mg/dL (ref 6–20)
CO2: 26 mmol/L (ref 22–32)
Calcium: 9 mg/dL (ref 8.9–10.3)
Chloride: 98 mmol/L (ref 98–111)
Creatinine, Ser: 0.87 mg/dL (ref 0.61–1.24)
GFR, Estimated: >60 mL/min (ref >60)
Glucose, Bld: 107 mg/dL — ABNORMAL HIGH (ref 70–99)
Potassium: 3.5 mmol/L (ref 3.5–5.1)
Sodium: 135 mmol/L (ref 135–145)

## 2022-03-17 LAB — CBC
HCT: 41.9 % (ref 39.0–52.0)
Hemoglobin: 13.8 g/dL (ref 13.0–17.0)
MCH: 29.9 pg (ref 26.0–34.0)
MCHC: 32.9 g/dL (ref 30.0–36.0)
MCV: 90.7 fL (ref 80.0–100.0)
Platelets: 293 10*3/uL (ref 150–400)
RBC: 4.62 MIL/uL (ref 4.22–5.81)
RDW: 15.9 % — ABNORMAL HIGH (ref 11.5–15.5)
WBC: 9 10*3/uL (ref 4.0–10.5)
nRBC: 0 % (ref 0.0–0.2)

## 2022-03-17 LAB — TYPE AND SCREEN
ABO/RH(D): AB POS
Antibody Screen: NEGATIVE

## 2022-03-17 LAB — SURGICAL PCR SCREEN
MRSA, PCR: NEGATIVE
Staphylococcus aureus: NEGATIVE

## 2022-03-17 LAB — HEMOGLOBIN A1C
Hgb A1c MFr Bld: 5.7 % — ABNORMAL HIGH (ref 4.8–5.6)
Mean Plasma Glucose: 116.89 mg/dL

## 2022-03-17 LAB — GLUCOSE, CAPILLARY: Glucose-Capillary: 135 mg/dL — ABNORMAL HIGH (ref 70–99)

## 2022-03-17 NOTE — Progress Notes (Signed)
Anesthesia note:   PCP - Dr. Collene Mares Cardiologist -none Other-   Chest x-ray - no EKG - 12/01/21-epic Stress Test - no ECHO - 2020 Cardiac Cath - no CABG-no Pacemaker/ICD device last checked:NA  Sleep Study - no CPAP -   CBG at PAT visit-135 Fasting Blood Sugar at home-doesn't test Checks Blood Sugar _doesn't know____  Blood Thinner:no Blood Thinner Instructions: Aspirin Instructions: Last Dose:  Anesthesia review:  /No    Patient denies shortness of breath, fever, cough and chest pain at PAT appointment. Pt has SOB sometimes when walking. He doesn't nee his inhalers daily.    Patient verbalized understanding of instructions that were given to them at the PAT appointment. Patient was also instructed that they will need to review over the PAT instructions again at home before surgery.yes

## 2022-03-21 ENCOUNTER — Ambulatory Visit: Payer: Self-pay | Admitting: Student

## 2022-03-21 NOTE — H&P (Signed)
TOTAL HIP ADMISSION H&P  Patient is admitted for right total hip arthroplasty.  Subjective:  Chief Complaint: right hip pain  HPI: Daniel [name], 52 y.o. male, has a history of pain and functional disability in the right hip(s) due to arthritis and patient has failed non-surgical conservative treatments for greater than 12 weeks to include NSAID's and/or analgesics, flexibility and strengthening excercises, use of assistive devices, and activity modification.  Onset of symptoms was gradual starting 10 years ago with rapidlly worsening course since that time.The patient noted no past surgery on the right hip(s).  Patient currently rates pain in the right hip at 10 out of 10 with activity. Patient has night pain, worsening of pain with activity and weight bearing, trendelenberg gait, pain that interfers with activities of daily living, and pain with passive range of motion. Patient has evidence of subchondral cysts, subchondral sclerosis, periarticular osteophytes, and joint space narrowing by imaging studies. This condition presents safety issues increasing the risk of falls.  There is no current active infection.  Patient Active Problem List   Diagnosis Date Noted   Tachycardia 12/02/2021   Osteoarthritis of left hip 12/01/2021   Morbid obesity ([place]) 07/06/2021   Hypomagnesemia 03/03/2018   HTN (hypertension) [id number]   Alcoholic hepatitis without ascites 03/02/2018   Chronic anemia 03/02/2018   Alcohol abuse 03/01/2018   Tobacco abuse 03/01/2018   Asthma 03/01/2018   Exacerbation of asthma    Past Medical History:  Diagnosis Date   Anxiety    Arthritis    Asthma    Dyspnea    Gout    Hypertension    Pre-diabetes     Past Surgical History:  Procedure Laterality Date   TOTAL HIP ARTHROPLASTY Left 12/01/2021   Procedure: TOTAL HIP ARTHROPLASTY ANTERIOR APPROACH;  Surgeon: Rod Can, MD;  Location: [facility] ORS;  Service: Orthopedics;  Laterality: Left;   WISDOM TOOTH  EXTRACTION     age 74    Current Outpatient Medications  Medication Sig Dispense Refill Last Dose   acetaminophen (TYLENOL) 500 MG tablet Take 1,000 mg by mouth every 6 (six) hours as needed for mild pain or moderate pain.      albuterol (VENTOLIN HFA) 108 (90 Base) MCG/ACT inhaler Inhale 1 puff into the lungs every 6 (six) hours as needed for wheezing or shortness of breath. 8.5 g 0    allopurinol (ZYLOPRIM) 300 MG tablet TAKE 1 TABLET BY MOUTH TWICE A DAY 180 tablet 1    amLODipine (NORVASC) 5 MG tablet Take 1 tablet (5 mg total) by mouth daily. 90 tablet 0    apixaban (ELIQUIS) 2.5 MG TABS tablet Take 1 tablet (2.5 mg total) by mouth 2 (two) times daily. (Patient not taking: Reported on 03/15/2022) 60 tablet 0    carvedilol (COREG) 6.25 MG tablet Take 1 tablet (6.25 mg total) by mouth 2 (two) times daily with a meal. 180 tablet 1    celecoxib (CELEBREX) 200 MG capsule Take 200 mg by mouth daily.      hydrochlorothiazide (HYDRODIURIL) 25 MG tablet TAKE 1 TABLET (25 MG TOTAL) BY MOUTH DAILY. 90 tablet 1    methocarbamol (ROBAXIN) 500 MG tablet Take 1 tablet (500 mg total) by mouth every 6 (six) hours as needed for muscle spasms. (Patient not taking: Reported on 03/15/2022) 20 tablet 0    ondansetron (ZOFRAN) 4 MG tablet Take 1 tablet (4 mg total) by mouth every 8 (eight) hours as needed for nausea or vomiting. 30 tablet 0  oxyCODONE (ROXICODONE) 5 MG immediate release tablet Take 1-2 tablets (5-10 mg total) by mouth every 4 (four) hours as needed for severe pain. (Patient not taking: Reported on 03/15/2022) 42 tablet 0    Oxycodone HCl 10 MG TABS Take 10 mg by mouth 3 (three) times daily as needed (pain).      potassium chloride (KLOR-CON 10) 10 MEQ tablet Take 1 tablet (10 mEq total) by mouth daily. 30 tablet 5    Semaglutide,0.25 or 0.'5MG'$ /DOS, (OZEMPIC, 0.25 OR 0.5 MG/DOSE,) 2 MG/3ML SOPN Inject 0.25 mg into the skin once a week. 3 mL 6    No current facility-administered medications for this  visit.   No Known Allergies  Social History   Tobacco Use   Smoking status: Every Day    Packs/day: 0.30    Types: Cigarettes   Smokeless tobacco: Never  Substance Use Topics   Alcohol use: Never    Family History  Problem Relation Age of Onset   Heart attack Mother      Review of Systems  Musculoskeletal:  Positive for arthralgias, back pain and gait problem.  All other systems reviewed and are negative.   Objective:  Physical Exam Constitutional:      Appearance: Normal appearance.  HENT:     Head: Normocephalic and atraumatic.     Nose: Nose normal.     Mouth/Throat:     Mouth: Mucous membranes are moist.     Pharynx: Oropharynx is clear.  Eyes:     Conjunctiva/sclera: Conjunctivae normal.  Cardiovascular:     Rate and Rhythm: Normal rate and regular rhythm.     Pulses: Normal pulses.     Heart sounds: Normal heart sounds.  Pulmonary:     Effort: Pulmonary effort is normal.     Breath sounds: Normal breath sounds.  Abdominal:     General: Abdomen is flat.     Palpations: Abdomen is soft.  Genitourinary:    Comments: Deferred. Musculoskeletal:     Cervical back: Normal range of motion and neck supple.     Comments: Examination of the right hip reveals no skin wounds or lesions. He has trochanteric tenderness to palpation. The right hip is extremely stiff. He has 35 degree flexion contracture. He flexes up to about 70 degrees. He internally rotates 0 degrees, and externally rotates 5 to 10 degrees. Pain with terminal flexion and rotation. Pain in the position of impingement.  Sensory and motor function intact in LE bilaterally. Distal pedal pulses 2+ bilaterally.  No significant pedal edema. Calves soft and non-tender.  Skin:    General: Skin is warm and dry.     Capillary Refill: Capillary refill takes less than 2 seconds.  Neurological:     General: No focal deficit present.     Mental Status: He is alert and oriented to person, place, and time.   Psychiatric:        Mood and Affect: Mood normal.        Behavior: Behavior normal.        Thought Content: Thought content normal.        Judgment: Judgment normal.     Vital signs in last 24 hours: '@VSRANGES'$ @  Labs:   Estimated body mass index is 36.3 kg/m as calculated from the following:   Height as of 03/17/22: '5\' 10"'$  (1.778 m).   Weight as of 03/17/22: 114.8 kg.   Imaging Review Plain radiographs demonstrate severe degenerative joint disease of the right hip(s). The bone quality  appears to be adequate for age and reported activity level.      Assessment/Plan:  End stage arthritis, right hip(s)  The patient history, physical examination, clinical judgement of the provider and imaging studies are consistent with end stage degenerative joint disease of the right hip(s) and total hip arthroplasty is deemed medically necessary. The treatment options including medical management, injection therapy, arthroscopy and arthroplasty were discussed at length. The risks and benefits of total hip arthroplasty were presented and reviewed. The risks due to aseptic loosening, infection, stiffness, dislocation/subluxation,  thromboembolic complications and other imponderables were discussed.  The patient acknowledged the explanation, agreed to proceed with the plan and consent was signed. Patient is being admitted for inpatient treatment for surgery, pain control, PT, OT, prophylactic antibiotics, VTE prophylaxis, progressive ambulation and ADL's and discharge planning.The patient is planning to be discharged home with HEP after an overnight stay.   Therapy Plans: HEP. Disposition: Home with wife Planned DVT Prophylaxis: Eliquis 2.'5mg'$  BID DME needed: Has rolling walker.  PCP: Cleared TXA: IV Allergies: NDKA Anesthesia Concerns: None.  BMI: 36.9 Last HgbA1c: 5.7 Other: - History of PE 02/2018. - Asthma - T2DM - Left THA 12/02/22 - Pain management, Dr. Nelva Bush, Oxycodone '10mg'$  TID - Hold  semaglutide on the 03/22/21.  - 03/17/22: Hgb 13.8, Cr. 0.87, K+ 3.5.   Patient's anticipated LOS is less than 2 midnights, meeting these requirements: - Younger than 72 - Lives within 1 hour of care - Has a competent adult at home to recover with post-op recover - NO history of  - Chronic pain requiring opiods  - Diabetes  - Coronary Artery Disease  - Heart failure  - Heart attack  - Stroke  - DVT/VTE  - Cardiac arrhythmia  - Respiratory Failure/COPD  - Renal failure  - Anemia  - Advanced Liver disease

## 2022-03-21 NOTE — H&P (View-Only) (Signed)
TOTAL HIP ADMISSION H&P  Patient is admitted for right total hip arthroplasty.  Subjective:  Chief Complaint: right hip pain  HPI: Daniel [name], 52 y.o. male, has a history of pain and functional disability in the right hip(s) due to arthritis and patient has failed non-surgical conservative treatments for greater than 12 weeks to include NSAID's and/or analgesics, flexibility and strengthening excercises, use of assistive devices, and activity modification.  Onset of symptoms was gradual starting 10 years ago with rapidlly worsening course since that time.The patient noted no past surgery on the right hip(s).  Patient currently rates pain in the right hip at 10 out of 10 with activity. Patient has night pain, worsening of pain with activity and weight bearing, trendelenberg gait, pain that interfers with activities of daily living, and pain with passive range of motion. Patient has evidence of subchondral cysts, subchondral sclerosis, periarticular osteophytes, and joint space narrowing by imaging studies. This condition presents safety issues increasing the risk of falls.  There is no current active infection.  Patient Active Problem List   Diagnosis Date Noted   Tachycardia 12/02/2021   Osteoarthritis of left hip 12/01/2021   Morbid obesity ([place]) 07/06/2021   Hypomagnesemia 03/03/2018   HTN (hypertension) [id number]   Alcoholic hepatitis without ascites 03/02/2018   Chronic anemia 03/02/2018   Alcohol abuse 03/01/2018   Tobacco abuse 03/01/2018   Asthma 03/01/2018   Exacerbation of asthma    Past Medical History:  Diagnosis Date   Anxiety    Arthritis    Asthma    Dyspnea    Gout    Hypertension    Pre-diabetes     Past Surgical History:  Procedure Laterality Date   TOTAL HIP ARTHROPLASTY Left 12/01/2021   Procedure: TOTAL HIP ARTHROPLASTY ANTERIOR APPROACH;  Surgeon: Rod Can, MD;  Location: [facility];  Service: Orthopedics;  Laterality: Left;   WISDOM TOOTH  EXTRACTION     age 39    Current Outpatient Medications  Medication Sig Dispense Refill Last Dose   acetaminophen (TYLENOL) 500 MG tablet Take 1,000 mg by mouth every 6 (six) hours as needed for mild pain or moderate pain.      albuterol (VENTOLIN HFA) 108 (90 Base) MCG/ACT inhaler Inhale 1 puff into the lungs every 6 (six) hours as needed for wheezing or shortness of breath. 8.5 g 0    allopurinol (ZYLOPRIM) 300 MG tablet TAKE 1 TABLET BY MOUTH TWICE A DAY 180 tablet 1    amLODipine (NORVASC) 5 MG tablet Take 1 tablet (5 mg total) by mouth daily. 90 tablet 0    apixaban (ELIQUIS) 2.5 MG TABS tablet Take 1 tablet (2.5 mg total) by mouth 2 (two) times daily. (Patient not taking: Reported on 03/15/2022) 60 tablet 0    carvedilol (COREG) 6.25 MG tablet Take 1 tablet (6.25 mg total) by mouth 2 (two) times daily with a meal. 180 tablet 1    celecoxib (CELEBREX) 200 MG capsule Take 200 mg by mouth daily.      hydrochlorothiazide (HYDRODIURIL) 25 MG tablet TAKE 1 TABLET (25 MG TOTAL) BY MOUTH DAILY. 90 tablet 1    methocarbamol (ROBAXIN) 500 MG tablet Take 1 tablet (500 mg total) by mouth every 6 (six) hours as needed for muscle spasms. (Patient not taking: Reported on 03/15/2022) 20 tablet 0    ondansetron (ZOFRAN) 4 MG tablet Take 1 tablet (4 mg total) by mouth every 8 (eight) hours as needed for nausea or vomiting. 30 tablet 0  oxyCODONE (ROXICODONE) 5 MG immediate release tablet Take 1-2 tablets (5-10 mg total) by mouth every 4 (four) hours as needed for severe pain. (Patient not taking: Reported on 03/15/2022) 42 tablet 0    Oxycodone HCl 10 MG TABS Take 10 mg by mouth 3 (three) times daily as needed (pain).      potassium chloride (KLOR-CON 10) 10 MEQ tablet Take 1 tablet (10 mEq total) by mouth daily. 30 tablet 5    Semaglutide,0.25 or 0.'5MG'$ /DOS, (OZEMPIC, 0.25 OR 0.5 MG/DOSE,) 2 MG/3ML SOPN Inject 0.25 mg into the skin once a week. 3 mL 6    No current facility-administered medications for this  visit.   No Known Allergies  Social History   Tobacco Use   Smoking status: Every Day    Packs/day: 0.30    Types: Cigarettes   Smokeless tobacco: Never  Substance Use Topics   Alcohol use: Never    Family History  Problem Relation Age of Onset   Heart attack Mother      Review of Systems  Musculoskeletal:  Positive for arthralgias, back pain and gait problem.  All other systems reviewed and are negative.   Objective:  Physical Exam Constitutional:      Appearance: Normal appearance.  HENT:     Head: Normocephalic and atraumatic.     Nose: Nose normal.     Mouth/Throat:     Mouth: Mucous membranes are moist.     Pharynx: Oropharynx is clear.  Eyes:     Conjunctiva/sclera: Conjunctivae normal.  Cardiovascular:     Rate and Rhythm: Normal rate and regular rhythm.     Pulses: Normal pulses.     Heart sounds: Normal heart sounds.  Pulmonary:     Effort: Pulmonary effort is normal.     Breath sounds: Normal breath sounds.  Abdominal:     General: Abdomen is flat.     Palpations: Abdomen is soft.  Genitourinary:    Comments: Deferred. Musculoskeletal:     Cervical back: Normal range of motion and neck supple.     Comments: Examination of the right hip reveals no skin wounds or lesions. He has trochanteric tenderness to palpation. The right hip is extremely stiff. He has 35 degree flexion contracture. He flexes up to about 70 degrees. He internally rotates 0 degrees, and externally rotates 5 to 10 degrees. Pain with terminal flexion and rotation. Pain in the position of impingement.  Sensory and motor function intact in LE bilaterally. Distal pedal pulses 2+ bilaterally.  No significant pedal edema. Calves soft and non-tender.  Skin:    General: Skin is warm and dry.     Capillary Refill: Capillary refill takes less than 2 seconds.  Neurological:     General: No focal deficit present.     Mental Status: He is alert and oriented to person, place, and time.   Psychiatric:        Mood and Affect: Mood normal.        Behavior: Behavior normal.        Thought Content: Thought content normal.        Judgment: Judgment normal.     Vital signs in last 24 hours: '@VSRANGES'$ @  Labs:   Estimated body mass index is 36.3 kg/m as calculated from the following:   Height as of 03/17/22: '5\' 10"'$  (1.778 m).   Weight as of 03/17/22: 114.8 kg.   Imaging Review Plain radiographs demonstrate severe degenerative joint disease of the right hip(s). The bone quality  appears to be adequate for age and reported activity level.      Assessment/Plan:  End stage arthritis, right hip(s)  The patient history, physical examination, clinical judgement of the provider and imaging studies are consistent with end stage degenerative joint disease of the right hip(s) and total hip arthroplasty is deemed medically necessary. The treatment options including medical management, injection therapy, arthroscopy and arthroplasty were discussed at length. The risks and benefits of total hip arthroplasty were presented and reviewed. The risks due to aseptic loosening, infection, stiffness, dislocation/subluxation,  thromboembolic complications and other imponderables were discussed.  The patient acknowledged the explanation, agreed to proceed with the plan and consent was signed. Patient is being admitted for inpatient treatment for surgery, pain control, PT, OT, prophylactic antibiotics, VTE prophylaxis, progressive ambulation and ADL's and discharge planning.The patient is planning to be discharged home with HEP after an overnight stay.   Therapy Plans: HEP. Disposition: Home with wife Planned DVT Prophylaxis: Eliquis 2.'5mg'$  BID DME needed: Has rolling walker.  PCP: Cleared TXA: IV Allergies: NDKA Anesthesia Concerns: None.  BMI: 36.9 Last HgbA1c: 5.7 Other: - History of PE 02/2018. - Asthma - T2DM - Left THA 12/02/22 - Pain management, Dr. Nelva Bush, Oxycodone '10mg'$  TID - Hold  semaglutide on the 03/22/21.  - 03/17/22: Hgb 13.8, Cr. 0.87, K+ 3.5.   Patient's anticipated LOS is less than 2 midnights, meeting these requirements: - Younger than 6 - Lives within 1 hour of care - Has a competent adult at home to recover with post-op recover - NO history of  - Chronic pain requiring opiods  - Diabetes  - Coronary Artery Disease  - Heart failure  - Heart attack  - Stroke  - DVT/VTE  - Cardiac arrhythmia  - Respiratory Failure/COPD  - Renal failure  - Anemia  - Advanced Liver disease

## 2022-03-23 ENCOUNTER — Other Ambulatory Visit: Payer: Self-pay

## 2022-03-29 ENCOUNTER — Ambulatory Visit (HOSPITAL_COMMUNITY): Payer: BC Managed Care – PPO

## 2022-03-29 ENCOUNTER — Encounter (HOSPITAL_COMMUNITY): Payer: Self-pay | Admitting: Orthopedic Surgery

## 2022-03-29 ENCOUNTER — Other Ambulatory Visit: Payer: Self-pay

## 2022-03-29 ENCOUNTER — Ambulatory Visit (HOSPITAL_COMMUNITY): Payer: BC Managed Care – PPO | Admitting: Certified Registered Nurse Anesthetist

## 2022-03-29 ENCOUNTER — Encounter (HOSPITAL_COMMUNITY)
Admission: RE | Disposition: A | Discharge status: Home / Self Care | Payer: Self-pay | Source: Home / Self Care | Attending: Orthopedic Surgery

## 2022-03-29 ENCOUNTER — Ambulatory Visit (HOSPITAL_COMMUNITY)
Admission: RE | Admit: 2022-03-29 | Discharge: 2022-03-30 | Disposition: A | Discharge status: Home / Self Care | Payer: BC Managed Care – PPO | Attending: Orthopedic Surgery | Admitting: Orthopedic Surgery

## 2022-03-29 DIAGNOSIS — Z96641 Presence of right artificial hip joint: Secondary | ICD-10-CM | POA: Diagnosis not present

## 2022-03-29 DIAGNOSIS — Z471 Aftercare following joint replacement surgery: Secondary | ICD-10-CM | POA: Diagnosis not present

## 2022-03-29 DIAGNOSIS — J45909 Unspecified asthma, uncomplicated: Secondary | ICD-10-CM | POA: Insufficient documentation

## 2022-03-29 DIAGNOSIS — F17210 Nicotine dependence, cigarettes, uncomplicated: Secondary | ICD-10-CM | POA: Insufficient documentation

## 2022-03-29 DIAGNOSIS — Z79899 Other long term (current) drug therapy: Secondary | ICD-10-CM | POA: Diagnosis not present

## 2022-03-29 DIAGNOSIS — R7303 Prediabetes: Secondary | ICD-10-CM

## 2022-03-29 DIAGNOSIS — Z01818 Encounter for other preprocedural examination: Secondary | ICD-10-CM

## 2022-03-29 DIAGNOSIS — M1611 Unilateral primary osteoarthritis, right hip: Secondary | ICD-10-CM | POA: Diagnosis not present

## 2022-03-29 DIAGNOSIS — I10 Essential (primary) hypertension: Secondary | ICD-10-CM

## 2022-03-29 HISTORY — PX: TOTAL HIP ARTHROPLASTY: SHX124

## 2022-03-29 LAB — GLUCOSE, CAPILLARY: Glucose-Capillary: 224 mg/dL — ABNORMAL HIGH (ref 70–99)

## 2022-03-29 SURGERY — ARTHROPLASTY, HIP, TOTAL, ANTERIOR APPROACH
Anesthesia: Monitor Anesthesia Care | Site: Hip | Laterality: Right

## 2022-03-29 MED ORDER — METOCLOPRAMIDE HCL 5 MG PO TABS: 5.0000 mg | ORAL_TABLET | Freq: Three times a day (TID) | ORAL | Status: DC | PRN

## 2022-03-29 MED ORDER — OXYCODONE HCL 5 MG/5ML PO SOLN: 5.0000 mg | Freq: Once | ORAL | Status: AC | PRN

## 2022-03-29 MED ORDER — ALLOPURINOL 300 MG PO TABS
300.0000 mg | ORAL_TABLET | Freq: Two times a day (BID) | ORAL | Status: DC
  Administered 2022-03-29 – 2022-03-30 (×2): 300 mg via ORAL
  Filled 2022-03-29 (×2): qty 1

## 2022-03-29 MED ORDER — PROPOFOL 1000 MG/100ML IV EMUL
INTRAVENOUS | Status: AC
  Filled 2022-03-29: qty 400

## 2022-03-29 MED ORDER — POLYETHYLENE GLYCOL 3350 17 G PO PACK: 17.0000 g | PACK | Freq: Every day | ORAL | Status: DC | PRN

## 2022-03-29 MED ORDER — CEFAZOLIN SODIUM-DEXTROSE 2-4 GM/100ML-% IV SOLN
2.0000 g | Freq: Four times a day (QID) | INTRAVENOUS | Status: AC
  Administered 2022-03-29 – 2022-03-30 (×2): 2 g via INTRAVENOUS
  Filled 2022-03-29 (×2): qty 100

## 2022-03-29 MED ORDER — CHLORHEXIDINE GLUCONATE 0.12 % MT SOLN
15.0000 mL | Freq: Once | OROMUCOSAL | Status: AC
  Administered 2022-03-29: 15 mL via OROMUCOSAL

## 2022-03-29 MED ORDER — ACETAMINOPHEN 500 MG PO TABS
1000.0000 mg | ORAL_TABLET | Freq: Four times a day (QID) | ORAL | Status: DC
  Administered 2022-03-29 – 2022-03-30 (×3): 1000 mg via ORAL
  Filled 2022-03-29 (×3): qty 2

## 2022-03-29 MED ORDER — SODIUM CHLORIDE 0.9 % IV SOLN: INTRAVENOUS | Status: DC

## 2022-03-29 MED ORDER — HYDROMORPHONE HCL 1 MG/ML IJ SOLN
0.2500 mg | INTRAMUSCULAR | Status: DC | PRN
  Administered 2022-03-29 (×4): 0.5 mg via INTRAVENOUS

## 2022-03-29 MED ORDER — APIXABAN 2.5 MG PO TABS
2.5000 mg | ORAL_TABLET | Freq: Two times a day (BID) | ORAL | Status: DC
  Administered 2022-03-30: 2.5 mg via ORAL
  Filled 2022-03-29: qty 1

## 2022-03-29 MED ORDER — ONDANSETRON HCL 4 MG PO TABS: 4.0000 mg | ORAL_TABLET | Freq: Four times a day (QID) | ORAL | Status: DC | PRN

## 2022-03-29 MED ORDER — ACETAMINOPHEN 325 MG PO TABS: 325.0000 mg | ORAL_TABLET | Freq: Four times a day (QID) | ORAL | Status: DC | PRN

## 2022-03-29 MED ORDER — KETOROLAC TROMETHAMINE 30 MG/ML IJ SOLN
INTRAMUSCULAR | Status: DC | PRN
  Administered 2022-03-29: 30 mg

## 2022-03-29 MED ORDER — HYDROMORPHONE HCL 1 MG/ML IJ SOLN
0.5000 mg | INTRAMUSCULAR | Status: DC | PRN
  Administered 2022-03-29: 1 mg via INTRAVENOUS
  Filled 2022-03-29: qty 1

## 2022-03-29 MED ORDER — MENTHOL 3 MG MT LOZG: 1.0000 | LOZENGE | OROMUCOSAL | Status: DC | PRN

## 2022-03-29 MED ORDER — TRANEXAMIC ACID-NACL 1000-0.7 MG/100ML-% IV SOLN
1000.0000 mg | INTRAVENOUS | Status: AC
  Administered 2022-03-29: 1000 mg via INTRAVENOUS
  Filled 2022-03-29: qty 100

## 2022-03-29 MED ORDER — METOCLOPRAMIDE HCL 5 MG/ML IJ SOLN: 5.0000 mg | Freq: Three times a day (TID) | INTRAMUSCULAR | Status: DC | PRN

## 2022-03-29 MED ORDER — OXYCODONE HCL 5 MG PO TABS
5.0000 mg | ORAL_TABLET | Freq: Once | ORAL | Status: AC | PRN
  Administered 2022-03-29: 5 mg via ORAL

## 2022-03-29 MED ORDER — METHOCARBAMOL 500 MG PO TABS
500.0000 mg | ORAL_TABLET | Freq: Four times a day (QID) | ORAL | Status: DC | PRN
  Administered 2022-03-30: 500 mg via ORAL
  Filled 2022-03-29: qty 1

## 2022-03-29 MED ORDER — AMLODIPINE BESYLATE 5 MG PO TABS
5.0000 mg | ORAL_TABLET | Freq: Every day | ORAL | Status: DC
  Administered 2022-03-30: 5 mg via ORAL
  Filled 2022-03-29: qty 1

## 2022-03-29 MED ORDER — ALBUTEROL SULFATE (2.5 MG/3ML) 0.083% IN NEBU: 2.5000 mg | INHALATION_SOLUTION | Freq: Four times a day (QID) | RESPIRATORY_TRACT | Status: DC | PRN

## 2022-03-29 MED ORDER — SODIUM CHLORIDE (PF) 0.9 % IJ SOLN
INTRAMUSCULAR | Status: DC | PRN
  Administered 2022-03-29: 10 mL

## 2022-03-29 MED ORDER — PROPOFOL 500 MG/50ML IV EMUL
INTRAVENOUS | Status: DC | PRN
  Administered 2022-03-29: 30 mg via INTRAVENOUS
  Administered 2022-03-29: 30 ug/kg/min via INTRAVENOUS
  Administered 2022-03-29: 30 mg via INTRAVENOUS

## 2022-03-29 MED ORDER — CARVEDILOL 6.25 MG PO TABS
6.2500 mg | ORAL_TABLET | Freq: Two times a day (BID) | ORAL | Status: DC
  Administered 2022-03-29 – 2022-03-30 (×2): 6.25 mg via ORAL
  Filled 2022-03-29 (×2): qty 1

## 2022-03-29 MED ORDER — METHOCARBAMOL 500 MG IVPB - SIMPLE MED: 500.0000 mg | Freq: Four times a day (QID) | INTRAVENOUS | Status: DC | PRN

## 2022-03-29 MED ORDER — ORAL CARE MOUTH RINSE: 15.0000 mL | Freq: Once | OROMUCOSAL | Status: AC

## 2022-03-29 MED ORDER — LIDOCAINE 2% (20 MG/ML) 5 ML SYRINGE
INTRAMUSCULAR | Status: DC | PRN
  Administered 2022-03-29: 80 mg via INTRAVENOUS

## 2022-03-29 MED ORDER — SODIUM CHLORIDE 0.9 % IR SOLN
Status: DC | PRN
  Administered 2022-03-29 (×2): 1000 mL

## 2022-03-29 MED ORDER — BUPIVACAINE IN DEXTROSE 0.75-8.25 % IT SOLN
INTRATHECAL | Status: DC | PRN
  Administered 2022-03-29: 1.8 mL via INTRATHECAL

## 2022-03-29 MED ORDER — SODIUM CHLORIDE (PF) 0.9 % IJ SOLN
INTRAMUSCULAR | Status: AC
  Filled 2022-03-29: qty 30

## 2022-03-29 MED ORDER — LACTATED RINGERS IV SOLN: INTRAVENOUS | Status: DC

## 2022-03-29 MED ORDER — HYDROMORPHONE HCL 1 MG/ML IJ SOLN
INTRAMUSCULAR | Status: AC
  Filled 2022-03-29: qty 2

## 2022-03-29 MED ORDER — PHENYLEPHRINE HCL-NACL 20-0.9 MG/250ML-% IV SOLN
INTRAVENOUS | Status: DC | PRN
  Administered 2022-03-29: 50 ug/min via INTRAVENOUS

## 2022-03-29 MED ORDER — FENTANYL CITRATE (PF) 100 MCG/2ML IJ SOLN
INTRAMUSCULAR | Status: AC
  Filled 2022-03-29: qty 2

## 2022-03-29 MED ORDER — OXYCODONE HCL 5 MG PO TABS
5.0000 mg | ORAL_TABLET | ORAL | Status: DC | PRN
  Administered 2022-03-30: 10 mg via ORAL
  Filled 2022-03-29: qty 2

## 2022-03-29 MED ORDER — OXYCODONE HCL 5 MG PO TABS
ORAL_TABLET | ORAL | Status: AC
  Filled 2022-03-29: qty 1

## 2022-03-29 MED ORDER — WATER FOR IRRIGATION, STERILE IR SOLN
Status: DC | PRN
  Administered 2022-03-29 (×2): 1000 mL

## 2022-03-29 MED ORDER — DIPHENHYDRAMINE HCL 12.5 MG/5ML PO ELIX: 12.5000 mg | ORAL_SOLUTION | ORAL | Status: DC | PRN

## 2022-03-29 MED ORDER — POVIDONE-IODINE 10 % EX SWAB: 2.0000 | Freq: Once | CUTANEOUS | Status: DC

## 2022-03-29 MED ORDER — DEXMEDETOMIDINE HCL IN NACL 80 MCG/20ML IV SOLN
INTRAVENOUS | Status: DC | PRN
  Administered 2022-03-29: 12 ug via BUCCAL

## 2022-03-29 MED ORDER — FENTANYL CITRATE (PF) 100 MCG/2ML IJ SOLN
INTRAMUSCULAR | Status: DC | PRN
  Administered 2022-03-29 (×3): 50 ug via INTRAVENOUS

## 2022-03-29 MED ORDER — OXYCODONE HCL 5 MG PO TABS
10.0000 mg | ORAL_TABLET | ORAL | Status: DC | PRN
  Administered 2022-03-29 – 2022-03-30 (×2): 10 mg via ORAL
  Filled 2022-03-29 (×2): qty 2

## 2022-03-29 MED ORDER — PHENYLEPHRINE 80 MCG/ML (10ML) SYRINGE FOR IV PUSH (FOR BLOOD PRESSURE SUPPORT)
PREFILLED_SYRINGE | INTRAVENOUS | Status: DC | PRN
  Administered 2022-03-29 (×2): 160 ug via INTRAVENOUS

## 2022-03-29 MED ORDER — PHENYLEPHRINE 80 MCG/ML (10ML) SYRINGE FOR IV PUSH (FOR BLOOD PRESSURE SUPPORT)
PREFILLED_SYRINGE | INTRAVENOUS | Status: AC
  Filled 2022-03-29: qty 20

## 2022-03-29 MED ORDER — DOCUSATE SODIUM 100 MG PO CAPS
100.0000 mg | ORAL_CAPSULE | Freq: Two times a day (BID) | ORAL | Status: DC
  Filled 2022-03-29 (×2): qty 1

## 2022-03-29 MED ORDER — METHOCARBAMOL 500 MG IVPB - SIMPLE MED
INTRAVENOUS | Status: AC
  Filled 2022-03-29: qty 55

## 2022-03-29 MED ORDER — KETOROLAC TROMETHAMINE 30 MG/ML IJ SOLN
INTRAMUSCULAR | Status: AC
  Filled 2022-03-29: qty 1

## 2022-03-29 MED ORDER — ONDANSETRON HCL 4 MG/2ML IJ SOLN: 4.0000 mg | Freq: Four times a day (QID) | INTRAMUSCULAR | Status: DC | PRN

## 2022-03-29 MED ORDER — INSULIN ASPART 100 UNIT/ML IJ SOLN
0.0000 [IU] | Freq: Three times a day (TID) | INTRAMUSCULAR | Status: DC
  Administered 2022-03-30: 2 [IU] via SUBCUTANEOUS

## 2022-03-29 MED ORDER — BUPIVACAINE-EPINEPHRINE (PF) 0.25% -1:200000 IJ SOLN
INTRAMUSCULAR | Status: AC
  Filled 2022-03-29: qty 30

## 2022-03-29 MED ORDER — CEFAZOLIN SODIUM-DEXTROSE 2-4 GM/100ML-% IV SOLN
2.0000 g | INTRAVENOUS | Status: AC
  Administered 2022-03-29: 2 g via INTRAVENOUS
  Filled 2022-03-29: qty 100

## 2022-03-29 MED ORDER — PHENOL 1.4 % MT LIQD: 1.0000 | OROMUCOSAL | Status: DC | PRN

## 2022-03-29 MED ORDER — FENTANYL CITRATE PF 50 MCG/ML IJ SOSY
PREFILLED_SYRINGE | INTRAMUSCULAR | Status: AC
  Filled 2022-03-29: qty 1

## 2022-03-29 MED ORDER — ALUM & MAG HYDROXIDE-SIMETH 200-200-20 MG/5ML PO SUSP: 30.0000 mL | ORAL | Status: DC | PRN

## 2022-03-29 MED ORDER — SENNA 8.6 MG PO TABS
1.0000 | ORAL_TABLET | Freq: Two times a day (BID) | ORAL | Status: DC
  Filled 2022-03-29 (×2): qty 1

## 2022-03-29 MED ORDER — MIDAZOLAM HCL 2 MG/2ML IJ SOLN
INTRAMUSCULAR | Status: DC | PRN
  Administered 2022-03-29: 2 mg via INTRAVENOUS

## 2022-03-29 MED ORDER — MIDAZOLAM HCL 2 MG/2ML IJ SOLN
INTRAMUSCULAR | Status: AC
  Filled 2022-03-29: qty 2

## 2022-03-29 MED ORDER — INSULIN ASPART 100 UNIT/ML IJ SOLN: 0.0000 [IU] | Freq: Every day | INTRAMUSCULAR | Status: DC

## 2022-03-29 MED ORDER — ACETAMINOPHEN 500 MG PO TABS
1000.0000 mg | ORAL_TABLET | Freq: Once | ORAL | Status: AC
  Administered 2022-03-29: 1000 mg via ORAL
  Filled 2022-03-29: qty 2

## 2022-03-29 MED ORDER — BUPIVACAINE-EPINEPHRINE 0.25% -1:200000 IJ SOLN
INTRAMUSCULAR | Status: DC | PRN
  Administered 2022-03-29: 30 mL

## 2022-03-29 MED ORDER — PROMETHAZINE HCL 25 MG/ML IJ SOLN: 6.2500 mg | INTRAMUSCULAR | Status: DC | PRN

## 2022-03-29 SURGICAL SUPPLY — 56 items
ADH SKN CLS APL DERMABOND .7 (GAUZE/BANDAGES/DRESSINGS) ×1
APL PRP STRL LF DISP 70% ISPRP (MISCELLANEOUS) ×1
BAG COUNTER SPONGE SURGICOUNT (BAG) IMPLANT
BAG DECANTER FOR FLEXI CONT (MISCELLANEOUS) IMPLANT
BAG SPEC THK2 15X12 ZIP CLS (MISCELLANEOUS) ×1
BAG SPNG CNTER NS LX DISP (BAG)
BAG ZIPLOCK 12X15 (MISCELLANEOUS) IMPLANT
CHLORAPREP W/TINT 26 (MISCELLANEOUS) ×1 IMPLANT
COVER PERINEAL POST (MISCELLANEOUS) ×1 IMPLANT
COVER SURGICAL LIGHT HANDLE (MISCELLANEOUS) ×1 IMPLANT
DERMABOND ADVANCED .7 DNX12 (GAUZE/BANDAGES/DRESSINGS) ×2 IMPLANT
DRAPE IMP U-DRAPE 54X76 (DRAPES) ×1 IMPLANT
DRAPE SHEET LG 3/4 BI-LAMINATE (DRAPES) ×3 IMPLANT
DRAPE STERI IOBAN 125X83 (DRAPES) ×1 IMPLANT
DRAPE U-SHAPE 47X51 STRL (DRAPES) ×2 IMPLANT
DRSG AQUACEL AG ADV 3.5X10 (GAUZE/BANDAGES/DRESSINGS) ×1 IMPLANT
ELECT REM PT RETURN 15FT ADLT (MISCELLANEOUS) ×1 IMPLANT
GAUZE SPONGE 4X4 12PLY STRL (GAUZE/BANDAGES/DRESSINGS) ×1 IMPLANT
GLOVE BIO SURGEON STRL SZ7 (GLOVE) ×1 IMPLANT
GLOVE BIO SURGEON STRL SZ8.5 (GLOVE) ×2 IMPLANT
GLOVE BIOGEL PI IND STRL 7.5 (GLOVE) ×1 IMPLANT
GLOVE BIOGEL PI IND STRL 8.5 (GLOVE) ×1 IMPLANT
GOWN SPEC L3 XXLG W/TWL (GOWN DISPOSABLE) ×1 IMPLANT
GOWN STRL REUS W/ TWL XL LVL3 (GOWN DISPOSABLE) ×1 IMPLANT
GOWN STRL REUS W/TWL XL LVL3 (GOWN DISPOSABLE) ×1
HANDPIECE INTERPULSE COAX TIP (DISPOSABLE) ×1
HEAD CERAMIC BIOLOX 36 (Head) IMPLANT
HOLDER FOLEY CATH W/STRAP (MISCELLANEOUS) ×1 IMPLANT
HOOD PEEL AWAY T7 (MISCELLANEOUS) ×3 IMPLANT
KIT TURNOVER KIT A (KITS) IMPLANT
LINER NCONST LONG G 36 G7 +5 (Liner) IMPLANT
MANIFOLD NEPTUNE II (INSTRUMENTS) ×1 IMPLANT
MARKER SKIN DUAL TIP RULER LAB (MISCELLANEOUS) ×1 IMPLANT
NDL SAFETY ECLIP 18X1.5 (MISCELLANEOUS) ×1 IMPLANT
NDL SPNL 18GX3.5 QUINCKE PK (NEEDLE) ×1 IMPLANT
NEEDLE SPNL 18GX3.5 QUINCKE PK (NEEDLE) ×1 IMPLANT
PACK ANTERIOR HIP CUSTOM (KITS) ×1 IMPLANT
PENCIL SMOKE EVACUATOR (MISCELLANEOUS) IMPLANT
SAW OSC TIP CART 19.5X105X1.3 (SAW) ×1 IMPLANT
SEALER BIPOLAR AQUA 6.0 (INSTRUMENTS) ×1 IMPLANT
SET HNDPC FAN SPRY TIP SCT (DISPOSABLE) ×1 IMPLANT
SHELL ACET G7 4H 58 SZG HIP (Shell) IMPLANT
SOLUTION PRONTOSAN WOUND 350ML (IRRIGATION / IRRIGATOR) ×1 IMPLANT
SPIKE FLUID TRANSFER (MISCELLANEOUS) ×1 IMPLANT
SUT MNCRL AB 3-0 PS2 18 (SUTURE) ×1 IMPLANT
SUT MON AB 2-0 CT1 36 (SUTURE) ×1 IMPLANT
SUT STRATAFIX PDO 1 14 VIOLET (SUTURE) ×1
SUT STRATFX PDO 1 14 VIOLET (SUTURE) ×1
SUT VIC AB 2-0 CT1 27 (SUTURE)
SUT VIC AB 2-0 CT1 TAPERPNT 27 (SUTURE) IMPLANT
SUTURE STRATFX PDO 1 14 VIOLET (SUTURE) ×1 IMPLANT
SYR 3ML LL SCALE MARK (SYRINGE) ×1 IMPLANT
TAPERLOC MCRO PRMY FMRL 16X117 (Joint) IMPLANT
TRAY FOLEY MTR SLVR 16FR STAT (SET/KITS/TRAYS/PACK) IMPLANT
TUBE SUCTION HIGH CAP CLEAR NV (SUCTIONS) ×1 IMPLANT
WATER STERILE IRR 1000ML POUR (IV SOLUTION) ×1 IMPLANT

## 2022-03-29 NOTE — Discharge Instructions (Signed)
Information on my medicine - ELIQUIS (apixaban)  This medication education was reviewed with me or my healthcare representative as part of my discharge preparation.  The pharmacist that spoke with me during my hospital stay was:    Why was Eliquis prescribed for you? Eliquis was prescribed for you to reduce the risk of blood clots forming after orthopedic surgery.    What do You need to know about Eliquis? Take your Eliquis TWICE DAILY - one tablet in the morning and one tablet in the evening with or without food.  It would be best to take the dose about the same time each day.  If you have difficulty swallowing the tablet whole please discuss with your pharmacist how to take the medication safely.  Take Eliquis exactly as prescribed by your doctor and DO NOT stop taking Eliquis without talking to the doctor who prescribed the medication.  Stopping without other medication to take the place of Eliquis may increase your risk of developing a clot.  After discharge, you should have regular check-up appointments with your healthcare provider that is prescribing your Eliquis.  What do you do if you miss a dose? If a dose of ELIQUIS is not taken at the scheduled time, take it as soon as possible on the same day and twice-daily administration should be resumed.  The dose should not be doubled to make up for a missed dose.  Do not take more than one tablet of ELIQUIS at the same time.  Important Safety Information A possible side effect of Eliquis is bleeding. You should call your healthcare provider right away if you experience any of the following: Bleeding from an injury or your nose that does not stop. Unusual colored urine (red or dark brown) or unusual colored stools (red or black). Unusual bruising for unknown reasons. A serious fall or if you hit your head (even if there is no bleeding).  Some medicines may interact with Eliquis and might increase your risk of bleeding or  clotting while on Eliquis. To help avoid this, consult your healthcare provider or pharmacist prior to using any new prescription or non-prescription medications, including herbals, vitamins, non-steroidal anti-inflammatory drugs (NSAIDs) and supplements.  This website has more information on Eliquis (apixaban): http://[email or web address].com/eliquis/home      Dr. Samson Frederic Joint Replacement Specialist [facility] [address] [place], [place] [address] [phone]   TOTAL HIP REPLACEMENT POSTOPERATIVE DIRECTIONS    Hip Rehabilitation, Guidelines Following Surgery   WEIGHT BEARING Weight bearing as tolerated with assist device (walker, cane, etc) as directed, use it as long as suggested by your surgeon or therapist, typically at least 4-6 weeks.  The results of a hip operation are greatly improved after range of motion and muscle strengthening exercises. Follow all safety measures which are given to protect your hip. If any of these exercises cause increased pain or swelling in your joint, decrease the amount until you are comfortable again. Then slowly increase the exercises. Call your caregiver if you have problems or questions.   HOME CARE INSTRUCTIONS  Most of the following instructions are designed to prevent the dislocation of your new hip.  Remove items at home which could result in a fall. This includes throw rugs or furniture in walking pathways.  Continue medications as instructed at time of discharge. You may have some home medications which will be placed on hold until you complete the course of blood thinner medication. You may start showering once you are discharged home.  Do not remove your dressing. Do not put on socks or shoes without following the instructions of your caregivers.   Sit on chairs with arms. Use the chair arms to help push yourself up when arising.  Arrange for the use of a toilet seat elevator so you are not sitting low.   Walk with walker as instructed.  You may resume a sexual relationship in one month or when given the OK by your caregiver.  Use walker as long as suggested by your caregivers.  You may put full weight on your legs and walk as much as is comfortable. Avoid periods of inactivity such as sitting longer than an hour when not asleep. This helps prevent blood clots.  You may return to work once you are cleared by Designer, industrial/product.  Do not drive a car for 6 weeks or until released by your surgeon.  Do not drive while taking narcotics.  Wear elastic stockings for two weeks following surgery during the day but you may remove then at night.  Make sure you keep all of your appointments after your operation with all of your doctors and caregivers. You should call the office at the above phone number and make an appointment for approximately two weeks after the date of your surgery. Please pick up a stool softener and laxative for home use as long as you are requiring pain medications. ICE to the affected hip every three hours for 30 minutes at a time and then as needed for pain and swelling. Continue to use ice on the hip for pain and swelling from surgery. You may notice swelling that will progress down to the foot and ankle.  This is normal after surgery.  Elevate the leg when you are not up walking on it.   It is important for you to complete the blood thinner medication as prescribed by your doctor. Continue to use the breathing machine which will help keep your temperature down.  It is common for your temperature to cycle up and down following surgery, especially at night when you are not up moving around and exerting yourself.  The breathing machine keeps your lungs expanded and your temperature down.  RANGE OF MOTION AND STRENGTHENING EXERCISES  These exercises are designed to help you keep full movement of your hip joint. Follow your caregiver's or physical therapist's instructions. Perform all exercises  about fifteen times, three times per day or as directed. Exercise both hips, even if you have had only one joint replacement. These exercises can be done on a training (exercise) mat, on the floor, on a table or on a bed. Use whatever works the best and is most comfortable for you. Use music or television while you are exercising so that the exercises are a pleasant break in your day. This will make your life better with the exercises acting as a break in routine you can look forward to.  Lying on your back, slowly slide your foot toward your buttocks, raising your knee up off the floor. Then slowly slide your foot back down until your leg is straight again.  Lying on your back spread your legs as far apart as you can without causing discomfort.  Lying on your side, raise your upper leg and foot straight up from the floor as far as is comfortable. Slowly lower the leg and repeat.  Lying on your back, tighten up the muscle in the front of your thigh (quadriceps muscles). You can do this by keeping your  leg straight and trying to raise your heel off the floor. This helps strengthen the largest muscle supporting your knee.  Lying on your back, tighten up the muscles of your buttocks both with the legs straight and with the knee bent at a comfortable angle while keeping your heel on the floor.   SKILLED REHAB INSTRUCTIONS: If the patient is transferred to a skilled rehab facility following release from the hospital, a list of the current medications will be sent to the facility for the patient to continue.  When discharged from the skilled rehab facility, please have the facility set up the patient's Home Health Physical Therapy prior to being released. Also, the skilled facility will be responsible for providing the patient with their medications at time of release from the facility to include their pain medication and their blood thinner medication. If the patient is still at the rehab facility at time of the  two week follow up appointment, the skilled rehab facility will also need to assist the patient in arranging follow up appointment in our office and any transportation needs.  POST-OPERATIVE OPIOID TAPER INSTRUCTIONS: It is important to wean off of your opioid medication as soon as possible. If you do not need pain medication after your surgery it is ok to stop day one. Opioids include: Codeine, Hydrocodone(Norco, Vicodin), Oxycodone(Percocet, oxycontin) and hydromorphone amongst others.  Long term and even short term use of opiods can cause: Increased pain response Dependence Constipation Depression Respiratory depression And more.  Withdrawal symptoms can include Flu like symptoms Nausea, vomiting And more Techniques to manage these symptoms Hydrate well Eat regular healthy meals Stay active Use relaxation techniques(deep breathing, meditating, yoga) Do Not substitute Alcohol to help with tapering If you have been on opioids for less than two weeks and do not have pain than it is ok to stop all together.  Plan to wean off of opioids This plan should start within one week post op of your joint replacement. Maintain the same interval or time between taking each dose and first decrease the dose.  Cut the total daily intake of opioids by one tablet each day Next start to increase the time between doses. The last dose that should be eliminated is the evening dose.    MAKE SURE YOU:  Understand these instructions.  Will watch your condition.  Will get help right away if you are not doing well or get worse.  Pick up stool softner and laxative for home use following surgery while on pain medications. Do not remove your dressing. The dressing is waterproof--it is OK to take showers. Continue to use ice for pain and swelling after surgery. Do not use any lotions or creams on the incision until instructed by your surgeon. Total Hip Protocol.

## 2022-03-29 NOTE — Anesthesia Postprocedure Evaluation (Signed)
Anesthesia Post Note  Patient: Daniel [name]  Procedure(s) Performed: TOTAL HIP ARTHROPLASTY ANTERIOR APPROACH (Right: Hip)     Patient location during evaluation: PACU Anesthesia Type: MAC and Spinal Level of consciousness: awake Pain management: pain level controlled Vital Signs Assessment: post-procedure vital signs reviewed and stable Respiratory status: spontaneous breathing Cardiovascular status: stable Postop Assessment: no headache, no backache, spinal receding, patient able to bend at knees and no apparent nausea or vomiting Anesthetic complications: no  No notable events documented.  Last Vitals:  Vitals:   03/29/22 1638 03/29/22 1700  BP: (!) 98/49 110/75  Pulse: 99 96  Resp:  20  Temp:    SpO2: 95% 91%    Last Pain:  Vitals:   03/29/22 1724  TempSrc:   PainSc: [place]

## 2022-03-29 NOTE — Plan of Care (Signed)
  Problem: Education: Goal: Ability to describe self-care measures that may prevent or decrease complications (Diabetes Survival Skills Education) will improve Outcome: Progressing   Problem: Activity: Goal: Ability to avoid complications of mobility impairment will improve Outcome: Progressing   Problem: Activity: Goal: Risk for activity intolerance will decrease Outcome: Progressing

## 2022-03-29 NOTE — Anesthesia Preprocedure Evaluation (Signed)
Anesthesia Evaluation  Patient identified by MRN, date of birth, ID band Patient awake    Reviewed: Allergy & Precautions, NPO status , Patient's Chart, lab work & pertinent test results  Airway Mallampati: II  TM Distance: >3 FB Neck ROM: Full    Dental no notable dental hx.    Pulmonary asthma , Current Smoker and Patient abstained from smoking.   Pulmonary exam normal        Cardiovascular hypertension, Pt. on medications and Pt. on home beta blockers  Rhythm:Regular Rate:Normal     Neuro/Psych   Anxiety     negative neurological ROS     GI/Hepatic negative GI ROS, Neg liver ROS,,,  Endo/Other  negative endocrine ROS    Renal/GU negative Renal ROS  negative genitourinary   Musculoskeletal  (+) Arthritis , Osteoarthritis,    Abdominal  (+) + obese  Peds  Hematology  (+) Blood dyscrasia, anemia   Anesthesia Other Findings   Reproductive/Obstetrics                             Anesthesia Physical Anesthesia Plan  ASA: 2  Anesthesia Plan: MAC and Spinal   Post-op Pain Management: Regional block*   Induction: Intravenous  PONV Risk Score and Plan: 1 and Ondansetron and Treatment may vary due to age or medical condition  Airway Management Planned: Simple Face Mask  Additional Equipment: None  Intra-op Plan:   Post-operative Plan:   Informed Consent: I have reviewed the patients History and Physical, chart, labs and discussed the procedure including the risks, benefits and alternatives for the proposed anesthesia with the patient or authorized representative who has indicated his/her understanding and acceptance.     Dental advisory given  Plan Discussed with: CRNA  Anesthesia Plan Comments: (Lab Results      Component                Value               Date                      WBC                      10.1                11/22/2021                HGB                       14.2                11/22/2021                HCT                      41.8                11/22/2021                MCV                      91.9                11/22/2021                PLT  299                 11/22/2021            Lab Results      Component                Value               Date                      NA                       138                 11/22/2021                K                        3.4 (L)             11/22/2021                CO2                      26                  11/22/2021                GLUCOSE                  107 (H)             11/22/2021                BUN                      11                  11/22/2021                CREATININE               0.76                11/22/2021                CALCIUM                  9.1                 11/22/2021                EGFR                     112                 07/06/2021                GFRNONAA                 >60                 11/22/2021           )       Anesthesia Quick Evaluation

## 2022-03-29 NOTE — Anesthesia Procedure Notes (Signed)
Spinal  Patient location during procedure: OR Start time: 03/29/2022 2:05 PM End time: 03/29/2022 2:10 PM Reason for block: surgical anesthesia Staffing Performed: anesthesiologist  Anesthesiologist: Lynda Rainwater, MD Performed by: Lynda Rainwater, MD Authorized by: Lynda Rainwater, MD   Preanesthetic Checklist Completed: patient identified, IV checked, site marked, risks and benefits discussed, surgical consent, monitors and equipment checked, pre-op evaluation and timeout performed Spinal Block Patient position: sitting Prep: DuraPrep Patient monitoring: heart rate, cardiac monitor, continuous pulse ox and blood pressure Approach: midline Location: L3-4 Injection technique: single-shot Needle Needle type: Sprotte  Needle gauge: 22 G Needle length: 10 cm Assessment Sensory level: T4 Events: CSF return and second provider Additional Notes CRNA unsuccessful with 24 g pencan.  Anesthesiologist successful with 22g Quinke but with pushing tissue in with some force.  Needle with barely enough length

## 2022-03-29 NOTE — Interval H&P Note (Signed)
History and Physical Interval Note:  03/29/2022 10:55 AM  Daniel [name]  has presented today for surgery, with the diagnosis of Right hip osteoarthritis.  The various methods of treatment have been discussed with the patient and family. After consideration of risks, benefits and other options for treatment, the patient has consented to  Procedure(s) with comments: [place] (Right) - 120 as a surgical intervention.  The patient's history has been reviewed, patient examined, no change in status, stable for surgery.  I have reviewed the patient's chart and labs.  Questions were answered to the patient's satisfaction.     [name]

## 2022-03-29 NOTE — Op Note (Signed)
OPERATIVE REPORT  SURGEON: Rod Can, MD   ASSISTANT: Larene Pickett, PA-C.  PREOPERATIVE DIAGNOSIS: Right hip arthritis.   POSTOPERATIVE DIAGNOSIS: Right hip arthritis.   PROCEDURE: Right total hip arthroplasty, anterior approach.   IMPLANTS: Biomet Taperloc Complete Microplasty stem, size 16 x 117 mm, high offset. Biomet G7 OsseoTi Cup, size 58 mm. Biomet Longevity liner, size 36 mm, G, +5 neutral. Biomet Biolox ceramic head ball, size 36 - 3 mm.  ANESTHESIA:  MAC and Spinal  ESTIMATED BLOOD LOSS:-250 mL    ANTIBIOTICS: 2 g Ancef.  DRAINS: None.  COMPLICATIONS: None.   CONDITION: PACU - hemodynamically stable.   BRIEF CLINICAL NOTE: Daniel [name] is a 52 y.o. male with a long-standing history of Right hip arthritis. After failing conservative management, the patient was indicated for total hip arthroplasty. The risks, benefits, and alternatives to the procedure were explained, and the patient elected to proceed.  PROCEDURE IN DETAIL: Surgical site was marked by myself in the pre-op holding area. Once inside the operating room, spinal anesthesia was obtained, and a foley catheter was inserted. The patient was then positioned on the Hana table.  All bony prominences were well padded.  The hip was prepped and draped in the normal sterile surgical fashion.  A time-out was called verifying side and site of surgery. The patient received IV antibiotics within 60 minutes of beginning the procedure.   Bikini incision was made, and superficial dissection was performed lateral to the ASIS. The direct anterior approach to the hip was performed through the Hueter interval.  Lateral femoral circumflex vessels were treated with the Auqumantys. The anterior capsule was exposed and an inverted T capsulotomy was made. The femoral neck cut was made to the level of the templated cut.  A corkscrew was placed into the head and the head was removed.  The femoral head was found to have  eburnated bone. The head was passed to the back table and was measured. Pubofemoral ligament was released off of the calcar, taking care to stay on bone. Superior capsule was released from the greater trochanter, taking care to stay lateral to the posterior border of the femoral neck in order to preserve the short external rotators.   Acetabular exposure was achieved, and the pulvinar and labrum were excised. Sequential reaming of the acetabulum was then performed up to a size 57 mm reamer. A 58 mm cup was then opened and impacted into place at approximately 40 degrees of abduction and 20 degrees of anteversion. The final polyethylene liner was impacted into place and acetabular osteophytes were removed.    I then gained femoral exposure taking care to protect the abductors and greater trochanter.  This was performed using standard external rotation, extension, and adduction.  A cookie cutter was used to enter the femoral canal, and then the femoral canal finder was placed.  Sequential broaching was performed up to a size 16.  Calcar planer was used on the femoral neck remnant.  I placed a high offset neck and a trial head ball.  The hip was reduced.  Leg lengths and offset were checked fluoroscopically.  The hip was dislocated and trial components were removed.  The final implants were placed, and the hip was reduced.  Fluoroscopy was used to confirm component position and leg lengths.  At 90 degrees of external rotation and full extension, the hip was stable to an anterior directed force.   The wound was copiously irrigated with Prontosan solution and normal saline using pulse  lavage.  Marcaine solution was injected into the periarticular soft tissue.  The wound was closed in layers using #1 Vicryl and V-Loc for the fascia, 2-0 Vicryl for the subcutaneous fat, 2-0 Monocryl for the deep dermal layer, 3-0 running Monocryl subcuticular stitch, and Dermabond for the skin.  Once the glue was fully dried, an  Aquacell Ag dressing was applied.  The patient was transported to the recovery room in stable condition.  Sponge, needle, and instrument counts were correct at the end of the case x2.  The patient tolerated the procedure well and there were no known complications.  Please note that a surgical assistant was a medical necessity for this procedure to perform it in a safe and expeditious manner. Assistant was necessary to provide appropriate retraction of vital neurovascular structures, to prevent femoral fracture, and to allow for anatomic placement of the prosthesis.

## 2022-03-29 NOTE — Transfer of Care (Signed)
Immediate Anesthesia Transfer of Care Note  Patient: Daniel [name]  Procedure(s) Performed: Procedure(s) with comments: TOTAL HIP ARTHROPLASTY ANTERIOR APPROACH (Right) - 120  Patient Location: PACU  Anesthesia Type:Spinal  Level of Consciousness: awake, alert  and oriented  Airway & Oxygen Therapy: Patient Spontanous Breathing  Post-op Assessment: Report given to RN and Post -op Vital signs reviewed and stable  Post vital signs: Reviewed and stable  Last Vitals:  Vitals:   03/29/22 0953  BP: (!) 142/96  Pulse: 95  Resp: 18  Temp: 37.2 C  SpO2: 0000000    Complications: No apparent anesthesia complications

## 2022-03-30 ENCOUNTER — Encounter (HOSPITAL_COMMUNITY): Payer: Self-pay | Admitting: Orthopedic Surgery

## 2022-03-30 DIAGNOSIS — Z79899 Other long term (current) drug therapy: Secondary | ICD-10-CM | POA: Diagnosis not present

## 2022-03-30 DIAGNOSIS — I10 Essential (primary) hypertension: Secondary | ICD-10-CM | POA: Diagnosis not present

## 2022-03-30 DIAGNOSIS — M1611 Unilateral primary osteoarthritis, right hip: Secondary | ICD-10-CM | POA: Diagnosis not present

## 2022-03-30 DIAGNOSIS — J45909 Unspecified asthma, uncomplicated: Secondary | ICD-10-CM | POA: Diagnosis not present

## 2022-03-30 DIAGNOSIS — F17210 Nicotine dependence, cigarettes, uncomplicated: Secondary | ICD-10-CM | POA: Diagnosis not present

## 2022-03-30 LAB — BASIC METABOLIC PANEL
Anion gap: 10 (ref 5–15)
BUN: 17 mg/dL (ref 6–20)
CO2: 27 mmol/L (ref 22–32)
Calcium: 8.5 mg/dL — ABNORMAL LOW (ref 8.9–10.3)
Chloride: 97 mmol/L — ABNORMAL LOW (ref 98–111)
Creatinine, Ser: 0.95 mg/dL (ref 0.61–1.24)
GFR, Estimated: >60 mL/min (ref >60)
Glucose, Bld: 152 mg/dL — ABNORMAL HIGH (ref 70–99)
Potassium: 3.8 mmol/L (ref 3.5–5.1)
Sodium: 134 mmol/L — ABNORMAL LOW (ref 135–145)

## 2022-03-30 LAB — CBC
HCT: 37.4 % — ABNORMAL LOW (ref 39.0–52.0)
Hemoglobin: 12.4 g/dL — ABNORMAL LOW (ref 13.0–17.0)
MCH: 30 pg (ref 26.0–34.0)
MCHC: 33.2 g/dL (ref 30.0–36.0)
MCV: 90.6 fL (ref 80.0–100.0)
Platelets: 258 10*3/uL (ref 150–400)
RBC: 4.13 MIL/uL — ABNORMAL LOW (ref 4.22–5.81)
RDW: 15.8 % — ABNORMAL HIGH (ref 11.5–15.5)
WBC: 15 10*3/uL — ABNORMAL HIGH (ref 4.0–10.5)
nRBC: 0 % (ref 0.0–0.2)

## 2022-03-30 LAB — GLUCOSE, CAPILLARY
Glucose-Capillary: 116 mg/dL — ABNORMAL HIGH (ref 70–99)
Glucose-Capillary: 194 mg/dL — ABNORMAL HIGH (ref 70–99)

## 2022-03-30 MED ORDER — POLYETHYLENE GLYCOL 3350 17 G PO PACK: 17.0000 g | PACK | Freq: Every day | ORAL | 0 refills | Status: AC | PRN

## 2022-03-30 MED ORDER — METHOCARBAMOL 500 MG PO TABS: 500.0000 mg | ORAL_TABLET | Freq: Four times a day (QID) | ORAL | 0 refills | Status: DC | PRN

## 2022-03-30 MED ORDER — APIXABAN 2.5 MG PO TABS: 2.5000 mg | ORAL_TABLET | Freq: Two times a day (BID) | ORAL | 0 refills | Status: DC

## 2022-03-30 MED ORDER — SENNA 8.6 MG PO TABS: 2.0000 | ORAL_TABLET | Freq: Every day | ORAL | 0 refills | Status: AC

## 2022-03-30 MED ORDER — OXYCODONE HCL 10 MG PO TABS: 10.0000 mg | ORAL_TABLET | ORAL | 0 refills | Status: AC | PRN

## 2022-03-30 MED ORDER — ONDANSETRON HCL 4 MG PO TABS: 4.0000 mg | ORAL_TABLET | Freq: Three times a day (TID) | ORAL | 0 refills | Status: DC | PRN

## 2022-03-30 MED ORDER — DOCUSATE SODIUM 100 MG PO CAPS: 100.0000 mg | ORAL_CAPSULE | Freq: Two times a day (BID) | ORAL | 0 refills | Status: AC

## 2022-03-30 NOTE — Evaluation (Signed)
Physical Therapy Evaluation Patient Details Name: Daniel [name] MRN: [id number] DOB: 24-Jul-1970 Today's Date: 03/30/2022  History of Present Illness  Pt s/p R THR and with hx of recent L THR  Clinical Impression  Pt s/p R THR and presents with decreased R LE strength/ROM and post op pain limiting functional mobility.  Pt up to ambulate in hall, negotiated stairs, reviewed car transfers and performed HEP with written instruction provided and reviewed.  Pt eager for dc home this date.     Recommendations for follow up therapy are one component of a multi-disciplinary discharge planning process, led by the attending physician.  Recommendations may be updated based on patient status, additional functional criteria and insurance authorization.  Follow Up Recommendations Follow physician's recommendations for discharge plan and follow up therapies      Assistance Recommended at Discharge PRN  Patient can return home with the following       Equipment Recommendations None recommended by PT  Recommendations for Other Services       Functional Status Assessment Patient has had a recent decline in their functional status and demonstrates the ability to make significant improvements in function in a reasonable and predictable amount of time.     Precautions / Restrictions Precautions Precautions: Fall Restrictions Weight Bearing Restrictions: No RLE Weight Bearing: Weight bearing as tolerated      Mobility  Bed Mobility Overal bed mobility: Needs Assistance Bed Mobility: Supine to Sit, Sit to Supine     Supine to sit: Supervision Sit to supine: Supervision   General bed mobility comments: Increased time with no physical assist    Transfers Overall transfer level: Needs assistance Equipment used: Rolling walker (2 wheels) Transfers: Sit to/from Stand Sit to Stand: Min guard, Supervision           General transfer comment: min cues for LE management and use of UEs to  self assist    Ambulation/Gait Ambulation/Gait assistance: Min guard, Supervision Gait Distance (Feet): 120 Feet Assistive device: Rolling walker (2 wheels) Gait Pattern/deviations: Step-to pattern, Step-through pattern, Shuffle, Trunk flexed       General Gait Details: cues for sequence, posture and position from RW  Stairs Stairs: Yes Stairs assistance: Min guard Stair Management: No rails, Step to pattern, Forwards, With walker Number of Stairs: 3 General stair comments: single step x3 with RW; cues for sequence  Wheelchair Mobility    Modified Rankin (Stroke Patients Only)       Balance Overall balance assessment: Mild deficits observed, not formally tested                                           Pertinent Vitals/Pain Pain Assessment Pain Assessment: 0-10 Pain Score: 4  Pain Location: R hip Pain Descriptors / Indicators: Aching, Sore Pain Intervention(s): Limited activity within patient's tolerance, Monitored during session, Premedicated before session, Ice applied    Home Living Family/patient expects to be discharged to:: Private residence Living Arrangements: Spouse/significant other;Children Available Help at Discharge: Family;Available 24 hours/day Type of Home: Apartment Home Access: Stairs to enter   Entrance Stairs-Number of Steps: 1   Home Layout: One level Home Equipment: Conservation officer, nature (2 wheels);Cane - single point      Prior Function Prior Level of Function : Independent/Modified Independent  Hand Dominance        Extremity/Trunk Assessment   Upper Extremity Assessment Upper Extremity Assessment: Overall WFL for tasks assessed    Lower Extremity Assessment Lower Extremity Assessment: RLE deficits/detail RLE Deficits / Details: Strength at hip 2/5 with AAROM at hip to 80 flex and 15 abd    Cervical / Trunk Assessment Cervical / Trunk Assessment: Normal  Communication    Communication: No difficulties  Cognition Arousal/Alertness: Awake/alert Behavior During Therapy: WFL for tasks assessed/performed Overall Cognitive Status: Within Functional Limits for tasks assessed                                          General Comments      Exercises Total Joint Exercises Ankle Circles/Pumps: AROM, Both, 15 reps, Supine Quad Sets: AROM, Both, 10 reps, Supine Heel Slides: AAROM, Right, 20 reps, Supine Hip ABduction/ADduction: AAROM, Right, 15 reps, Supine Long Arc Quad: AROM, Right, 10 reps, Seated   Assessment/Plan    PT Assessment Patient needs continued PT services  PT Problem List Decreased strength;Decreased range of motion;Decreased activity tolerance;Decreased balance;Decreased mobility;Decreased knowledge of use of DME;Pain       PT Treatment Interventions DME instruction;Gait training;Stair training;Functional mobility training;Therapeutic activities;Therapeutic exercise;Patient/family education    PT Goals (Current goals can be found in the Care Plan section)  Acute Rehab PT Goals Patient Stated Goal: HOME PT Goal Formulation: With patient Time For Goal Achievement: 04/06/22 Potential to Achieve Goals: Good    Frequency Min 1X/week     Co-evaluation               AM-PAC PT "6 Clicks" Mobility  Outcome Measure Help needed turning from your back to your side while in a flat bed without using bedrails?: A Little Help needed moving from lying on your back to sitting on the side of a flat bed without using bedrails?: A Little Help needed moving to and from a bed to a chair (including a wheelchair)?: A Little Help needed standing up from a chair using your arms (e.g., wheelchair or bedside chair)?: A Little Help needed to walk in hospital room?: A Little Help needed climbing 3-5 steps with a railing? : A Little 6 Click Score: 18    End of Session Equipment Utilized During Treatment: Gait belt Activity Tolerance:  Patient tolerated treatment well Patient left: in chair;with call bell/phone within reach;with chair alarm set Nurse Communication: Mobility status PT Visit Diagnosis: Difficulty in walking, not elsewhere classified (R26.2)    Time: [id number] PT Time Calculation (min) (ACUTE ONLY): 44 min   Charges:   PT Evaluation $PT Eval Low Complexity: 1 Low PT Treatments $Gait Training: 8-22 mins $Therapeutic Exercise: 8-22 mins        Sheldon Pager [phone] Office [phone]   Daniel [name] 03/30/2022, 11:30 AM

## 2022-03-30 NOTE — Plan of Care (Signed)
Problem: Activity: Goal: Ability to avoid complications of mobility impairment will improve Outcome: Progressing   Problem: Clinical Measurements: Goal: Postoperative complications will be avoided or minimized Outcome: Progressing   Problem: Pain Management: Goal: Pain level will decrease with appropriate interventions Outcome: Mullan, RN 03/30/22 7:41 AM

## 2022-03-30 NOTE — TOC Transition Note (Signed)
Transition of Care [facility]) - CM/SW Discharge Note   Patient Details  Name: Daniel [name] MRN: [id number] Date of Birth: 20-Jul-1970  [facility] CM/SW Contact:  Lennart Pall, LCSW Phone Number: 03/30/2022, 10:41 AM   Clinical Narrative:     Met with pt and confirming he has needed DME at home.  Plan for HEP.  No TOC needs.  Final next level of care: Home/Self Care Barriers to Discharge: No Barriers Identified   Patient Goals and CMS Choice      Discharge Placement                         Discharge Plan and Services Additional resources added to the After Visit Summary for                  DME Arranged: N/A DME Agency: NA                  Social Determinants of Health ([place]) Interventions [place]: Food Insecurity Present (12/01/2021)  Housing: Low Risk  (03/29/2022)  Transportation Needs: No Transportation Needs (12/01/2021)  Utilities: Not At Risk (12/01/2021)  Depression (PHQ2-9): Medium Risk (01/09/2022)  Tobacco Use: High Risk (03/29/2022)     Readmission Risk Interventions     No data to display

## 2022-03-30 NOTE — Plan of Care (Signed)
  Problem: Coping: Goal: Ability to adjust to condition or change in health will improve Outcome: Progressing   Problem: Pain Management: Goal: Pain level will decrease with appropriate interventions Outcome: Progressing   

## 2022-03-30 NOTE — Progress Notes (Signed)
    Subjective:  Patient reports pain as mild to moderate.  Denies N/V/CP/SOB/Abd pain. He denies any tingling or numbness in LE bilaterally. Reports mild thigh pain and swelling this morning. He is eager to discharge home.   Objective:   VITALS:   Vitals:   03/29/22 1826 03/29/22 2204 03/30/22 0206 03/30/22 0547  BP:  122/79 116/77 125/84  Pulse:  (!) 109 100 87  Resp:  '18 16 16  '$ Temp:  98.5 F (36.9 C) 98.2 F (36.8 C) 98.2 F (36.8 C)  TempSrc:  Oral Oral Oral  SpO2:  99% 96% 100%  Weight: 114 kg     Height: '5\' 10"'$  (1.778 m)       Patient sitting in recliner this morning. NAD.  Neurologically intact ABD soft Neurovascular intact Sensation intact distally Intact pulses distally Dorsiflexion/Plantar flexion intact Incision: dressing C/D/I No cellulitis present Compartment soft   Lab Results  Component Value Date   WBC 15.0 (H) 03/30/2022   HGB 12.4 (L) 03/30/2022   HCT 37.4 (L) 03/30/2022   MCV 90.6 03/30/2022   PLT 258 03/30/2022   BMET    Component Value Date/Time   NA 134 (L) 03/30/2022 0317   NA 137 01/09/2022 0927   K 3.8 03/30/2022 0317   CL 97 (L) 03/30/2022 0317   CO2 27 03/30/2022 0317   GLUCOSE 152 (H) 03/30/2022 0317   BUN 17 03/30/2022 0317   BUN 8 01/09/2022 0927   CREATININE 0.95 03/30/2022 0317   CREATININE 0.84 04/26/2021 1117   CALCIUM 8.5 (L) 03/30/2022 0317   EGFR 102 01/09/2022 0927   GFRNONAA >60 03/30/2022 0317     Assessment/Plan: 1 Day Post-Op   Principal Problem:   Osteoarthritis of right hip   WBAT with walker DVT ppx:  Eliquis 2.'5mg'$  BID , SCDs, TEDS PO pain control PT/OT: Pt has not seen yet. PT to come by today.  Dispo: D/c home with HEP once cleared with PT. He will follow-up in office in 2 weeks.    Charlott Rakes, PA-C 03/30/2022, 7:26 AM   [facility]  [place] [address], [address], [place],  [address] Phone: [phone] [email or web address] Facebook  [employer]

## 2022-04-11 DIAGNOSIS — Z96641 Presence of right artificial hip joint: Secondary | ICD-10-CM | POA: Diagnosis not present

## 2022-04-11 DIAGNOSIS — Z471 Aftercare following joint replacement surgery: Secondary | ICD-10-CM | POA: Diagnosis not present

## 2022-05-09 DIAGNOSIS — Z471 Aftercare following joint replacement surgery: Secondary | ICD-10-CM | POA: Diagnosis not present

## 2022-05-09 DIAGNOSIS — Z96641 Presence of right artificial hip joint: Secondary | ICD-10-CM | POA: Diagnosis not present

## 2022-05-19 ENCOUNTER — Encounter (HOSPITAL_COMMUNITY): Payer: Self-pay | Admitting: *Deleted

## 2022-05-19 ENCOUNTER — Other Ambulatory Visit: Payer: Self-pay

## 2022-05-19 ENCOUNTER — Emergency Department (HOSPITAL_COMMUNITY)
Admission: EM | Admit: 2022-05-19 | Discharge: 2022-05-20 | Disposition: A | Discharge status: Home / Self Care | Payer: BC Managed Care – PPO | Attending: Emergency Medicine | Admitting: Emergency Medicine

## 2022-05-19 ENCOUNTER — Ambulatory Visit (INDEPENDENT_AMBULATORY_CARE_PROVIDER_SITE_OTHER)
Admission: EM | Admit: 2022-05-19 | Discharge: 2022-05-19 | Disposition: A | Discharge status: Home / Self Care | Payer: BC Managed Care – PPO | Source: Home / Self Care

## 2022-05-19 ENCOUNTER — Encounter (HOSPITAL_COMMUNITY): Payer: Self-pay

## 2022-05-19 ENCOUNTER — Telehealth (HOSPITAL_COMMUNITY): Payer: Self-pay | Admitting: Internal Medicine

## 2022-05-19 DIAGNOSIS — N3000 Acute cystitis without hematuria: Secondary | ICD-10-CM | POA: Insufficient documentation

## 2022-05-19 DIAGNOSIS — R002 Palpitations: Secondary | ICD-10-CM | POA: Insufficient documentation

## 2022-05-19 DIAGNOSIS — N3001 Acute cystitis with hematuria: Secondary | ICD-10-CM

## 2022-05-19 DIAGNOSIS — R350 Frequency of micturition: Secondary | ICD-10-CM | POA: Diagnosis not present

## 2022-05-19 DIAGNOSIS — I10 Essential (primary) hypertension: Secondary | ICD-10-CM | POA: Insufficient documentation

## 2022-05-19 DIAGNOSIS — R509 Fever, unspecified: Secondary | ICD-10-CM

## 2022-05-19 DIAGNOSIS — Z7901 Long term (current) use of anticoagulants: Secondary | ICD-10-CM | POA: Insufficient documentation

## 2022-05-19 DIAGNOSIS — N390 Urinary tract infection, site not specified: Secondary | ICD-10-CM | POA: Insufficient documentation

## 2022-05-19 DIAGNOSIS — R000 Tachycardia, unspecified: Secondary | ICD-10-CM | POA: Diagnosis not present

## 2022-05-19 DIAGNOSIS — Z79899 Other long term (current) drug therapy: Secondary | ICD-10-CM | POA: Insufficient documentation

## 2022-05-19 DIAGNOSIS — R300 Dysuria: Secondary | ICD-10-CM

## 2022-05-19 DIAGNOSIS — I493 Ventricular premature depolarization: Secondary | ICD-10-CM

## 2022-05-19 DIAGNOSIS — N3090 Cystitis, unspecified without hematuria: Secondary | ICD-10-CM | POA: Insufficient documentation

## 2022-05-19 DIAGNOSIS — K5730 Diverticulosis of large intestine without perforation or abscess without bleeding: Secondary | ICD-10-CM | POA: Diagnosis not present

## 2022-05-19 LAB — COMPREHENSIVE METABOLIC PANEL
ALT: 9 U/L (ref 0–44)
ALT: 9 U/L (ref 0–44)
AST: 15 U/L (ref 15–41)
AST: 16 U/L (ref 15–41)
Albumin: 3.7 g/dL (ref 3.5–5.0)
Albumin: 3.6 g/dL (ref 3.5–5.0)
Alkaline Phosphatase: 70 U/L (ref 38–126)
Alkaline Phosphatase: 67 U/L (ref 38–126)
Anion gap: 12 (ref 5–15)
Anion gap: 10 (ref 5–15)
BUN: 11 mg/dL (ref 6–20)
BUN: 10 mg/dL (ref 6–20)
CO2: 23 mmol/L (ref 22–32)
CO2: 28 mmol/L (ref 22–32)
Calcium: 9.1 mg/dL (ref 8.9–10.3)
Calcium: 9.2 mg/dL (ref 8.9–10.3)
Chloride: 97 mmol/L — ABNORMAL LOW (ref 98–111)
Chloride: 95 mmol/L — ABNORMAL LOW (ref 98–111)
Creatinine, Ser: 0.87 mg/dL (ref 0.61–1.24)
Creatinine, Ser: 0.99 mg/dL (ref 0.61–1.24)
GFR, Estimated: >60 mL/min (ref >60)
GFR, Estimated: >60 mL/min (ref >60)
Glucose, Bld: 117 mg/dL — ABNORMAL HIGH (ref 70–99)
Glucose, Bld: 105 mg/dL — ABNORMAL HIGH (ref 70–99)
Potassium: 3.1 mmol/L — ABNORMAL LOW (ref 3.5–5.1)
Potassium: 3.2 mmol/L — ABNORMAL LOW (ref 3.5–5.1)
Sodium: 132 mmol/L — ABNORMAL LOW (ref 135–145)
Sodium: 133 mmol/L — ABNORMAL LOW (ref 135–145)
Total Bilirubin: 1.2 mg/dL (ref 0.3–1.2)
Total Bilirubin: 0.7 mg/dL (ref 0.3–1.2)
Total Protein: 7.7 g/dL (ref 6.5–8.1)
Total Protein: 7.6 g/dL (ref 6.5–8.1)

## 2022-05-19 LAB — CBC
HCT: 38.7 % — ABNORMAL LOW (ref 39.0–52.0)
HCT: 37.7 % — ABNORMAL LOW (ref 39.0–52.0)
Hemoglobin: 13.4 g/dL (ref 13.0–17.0)
Hemoglobin: 12.9 g/dL — ABNORMAL LOW (ref 13.0–17.0)
MCH: 30.5 pg (ref 26.0–34.0)
MCH: 30.6 pg (ref 26.0–34.0)
MCHC: 34.6 g/dL (ref 30.0–36.0)
MCHC: 34.2 g/dL (ref 30.0–36.0)
MCV: 88 fL (ref 80.0–100.0)
MCV: 89.3 fL (ref 80.0–100.0)
Platelets: 293 10*3/uL (ref 150–400)
Platelets: 288 10*3/uL (ref 150–400)
RBC: 4.4 MIL/uL (ref 4.22–5.81)
RBC: 4.22 MIL/uL (ref 4.22–5.81)
RDW: 15.3 % (ref 11.5–15.5)
RDW: 15.6 % — ABNORMAL HIGH (ref 11.5–15.5)
WBC: 18.2 10*3/uL — ABNORMAL HIGH (ref 4.0–10.5)
WBC: 17.3 10*3/uL — ABNORMAL HIGH (ref 4.0–10.5)
nRBC: 0 % (ref 0.0–0.2)
nRBC: 0 % (ref 0.0–0.2)

## 2022-05-19 LAB — URINALYSIS, ROUTINE W REFLEX MICROSCOPIC
Bilirubin Urine: NEGATIVE
Glucose, UA: NEGATIVE mg/dL
Ketones, ur: NEGATIVE mg/dL
Nitrite: NEGATIVE
Protein, ur: NEGATIVE mg/dL
Specific Gravity, Urine: 1.009 (ref 1.005–1.030)
pH: 6 (ref 5.0–8.0)

## 2022-05-19 LAB — POCT URINALYSIS DIP (MANUAL ENTRY)
Bilirubin, UA: NEGATIVE
Glucose, UA: NEGATIVE mg/dL
Ketones, POC UA: NEGATIVE mg/dL
Nitrite, UA: POSITIVE — AB
Protein Ur, POC: NEGATIVE mg/dL
Spec Grav, UA: 1.02 (ref 1.010–1.025)
Urobilinogen, UA: 1 E.U./dL
pH, UA: 7 (ref 5.0–8.0)

## 2022-05-19 LAB — LIPASE, BLOOD: Lipase: 30 U/L (ref 11–51)

## 2022-05-19 MED ORDER — CIPROFLOXACIN HCL 500 MG PO TABS: 500.0000 mg | ORAL_TABLET | Freq: Two times a day (BID) | ORAL | 0 refills | Status: DC

## 2022-05-19 MED ORDER — CEFTRIAXONE SODIUM 1 G IJ SOLR
INTRAMUSCULAR | Status: AC
  Filled 2022-05-19: qty 10

## 2022-05-19 MED ORDER — CEFTRIAXONE SODIUM 1 G IJ SOLR
1.0000 g | Freq: Once | INTRAMUSCULAR | Status: AC
  Administered 2022-05-19: 1 g via INTRAMUSCULAR

## 2022-05-19 MED ORDER — ACETAMINOPHEN 325 MG PO TABS
ORAL_TABLET | ORAL | Status: AC
  Filled 2022-05-19: qty 1

## 2022-05-19 MED ORDER — ACETAMINOPHEN 325 MG PO TABS
975.0000 mg | ORAL_TABLET | Freq: Once | ORAL | Status: AC
  Administered 2022-05-19: 975 mg via ORAL

## 2022-05-19 MED ORDER — LIDOCAINE HCL (PF) 1 % IJ SOLN
INTRAMUSCULAR | Status: AC
  Filled 2022-05-19: qty 2

## 2022-05-19 NOTE — ED Triage Notes (Signed)
Patient c/o dysuria since last night. Patient also c/o urinary frequency and retention.  Patient denies abdominal pain or penile discharge.

## 2022-05-19 NOTE — ED Triage Notes (Signed)
The pt reports that he has a uti since yesterday  no blood in his urine he has frequency  he [facility] and sent here

## 2022-05-19 NOTE — ED Provider Notes (Signed)
[facility]    CSN: [id number] Arrival date & time: 05/19/22  1524      History   Chief Complaint Chief Complaint  Patient presents with   Dysuria    HPI Daniel [name] is a 52 y.o. male.   Patient presents to urgent care for evaluation of dysuria that started last night abruptly. Reports fever and chills last night, states he woke up in a sweat. He has had some urinary leakage and urinary frequency over the last few days as well. Reports associated bilateral low back pain with intermittent dizziness. No recent N/V/D, abdominal pain, flank pain, headaches, vision changes, or gross hematuria. Sometimes urinary stream is weak and sometimes strong. He has had a UTI in the past but this was when he had his left hip surgery. No recent antibiotic or steroid use. Takes ozempic every week (most recently Wednesday May 17, 2022) for diabetes. No recent new sexual partners or concern for STD.  He has not attempted use of any over the counter medications before coming to urgent care.    Dysuria Presenting symptoms: dysuria     Past Medical History:  Diagnosis Date   Anxiety    Arthritis    Asthma    Dyspnea    Gout    Hypertension    Pre-diabetes     Patient Active Problem List   Diagnosis Date Noted   Osteoarthritis of right hip 03/29/2022   Tachycardia 12/02/2021   Osteoarthritis of left hip 12/01/2021   Morbid obesity (HCC) 07/06/2021   Hypomagnesemia 03/03/2018   HTN (hypertension) 03/02/2018   Alcoholic hepatitis without ascites 03/02/2018   Chronic anemia 03/02/2018   Alcohol abuse 03/01/2018   Tobacco abuse 03/01/2018   Asthma 03/01/2018   Exacerbation of asthma     Past Surgical History:  Procedure Laterality Date   TOTAL HIP ARTHROPLASTY Left 12/01/2021   Procedure: TOTAL HIP ARTHROPLASTY ANTERIOR APPROACH;  Surgeon: Samson Frederic, MD;  Location: [facility];  Service: Orthopedics;  Laterality: Left;   TOTAL HIP ARTHROPLASTY Right 03/29/2022    Procedure: TOTAL HIP ARTHROPLASTY ANTERIOR APPROACH;  Surgeon: Samson Frederic, MD;  Location: [facility];  Service: Orthopedics;  Laterality: Right;  120   WISDOM TOOTH EXTRACTION     age 53       Home Medications    Prior to Admission medications   Medication Sig Start Date End Date Taking? Authorizing Provider  acetaminophen (TYLENOL) 500 MG tablet Take 1,000 mg by mouth every 6 (six) hours as needed for mild pain or moderate pain.    [provider]  albuterol (VENTOLIN HFA) 108 (90 Base) MCG/ACT inhaler Inhale 1 puff into the lungs every 6 (six) hours as needed for wheezing or shortness of breath. 07/06/20   Hoy Register, MD  allopurinol (ZYLOPRIM) 300 MG tablet TAKE 1 TABLET BY MOUTH TWICE A DAY 02/16/22   Hoy Register, MD  amLODipine (NORVASC) 5 MG tablet Take 1 tablet (5 mg total) by mouth daily. 02/16/22   Hoy Register, MD  apixaban (ELIQUIS) 2.5 MG TABS tablet Take 1 tablet (2.5 mg total) by mouth 2 (two) times daily. 03/30/22 04/29/22  Clois Dupes, PA-C  carvedilol (COREG) 6.25 MG tablet Take 1 tablet (6.25 mg total) by mouth 2 (two) times daily with a meal. 02/14/22   Hoy Register, MD  celecoxib (CELEBREX) 200 MG capsule Take 200 mg by mouth daily. 02/07/22   [provider]  cephALEXin (KEFLEX) 500 MG capsule Take 1 capsule (500 mg  total) by mouth 2 (two) times daily for 7 days. 05/20/22 05/27/22  Carroll Sage, PA-C  hydrochlorothiazide (HYDRODIURIL) 25 MG tablet TAKE 1 TABLET (25 MG TOTAL) BY MOUTH DAILY. 02/16/22   Hoy Register, MD  methocarbamol (ROBAXIN) 500 MG tablet Take 1 tablet (500 mg total) by mouth every 6 (six) hours as needed for muscle spasms. 03/30/22   Hill, Alain Honey, PA-C  ondansetron (ZOFRAN) 4 MG tablet Take 1 tablet (4 mg total) by mouth every 8 (eight) hours as needed for nausea or vomiting. 03/30/22 03/30/23  Clois Dupes, PA-C  potassium chloride (KLOR-CON 10) 10 MEQ tablet Take 1 tablet (10 mEq total) by mouth daily. 01/10/22   Hoy Register, MD  Semaglutide,0.25 or 0.5MG /DOS, (OZEMPIC, 0.25 OR 0.5 MG/DOSE,) 2 MG/3ML SOPN Inject 0.25 mg into the skin once a week. 01/09/22   Hoy Register, MD  colchicine 0.6 MG tablet Take 2 tabs (1.2mg ) at the onset of a Gout flare, may repeat 1 tab (0.6mg ) after 2 hours if symptoms persist 09/30/19 04/12/20  Hoy Register, MD    Family History Family History  Problem Relation Age of Onset   Heart attack Mother     Social History Social History   Tobacco Use   Smoking status: Every Day    Packs/day: .3    Types: Cigarettes   Smokeless tobacco: Never  Vaping Use   Vaping Use: Never used  Substance Use Topics   Alcohol use: Never   Drug use: Never     Allergies   Patient has no known allergies.   Review of Systems Review of Systems  Genitourinary:  Positive for dysuria.  Per HPI   Physical Exam Triage Vital Signs ED Triage Vitals  Enc Vitals Group     BP 05/19/22 1648 119/84     Pulse Rate 05/19/22 1648 89     Resp 05/19/22 1648 16     Temp 05/19/22 1648 100.2 F (37.9 C)     Temp Source 05/19/22 1648 Oral     SpO2 05/19/22 1648 95 %     Weight --      Height --      Head Circumference --      Peak Flow --      Pain Score 05/19/22 1650 0     Pain Loc --      Pain Edu? --      Excl. in GC? --    No data found.  Updated Vital Signs BP 119/84 (BP Location: Left Arm)   Pulse 100   Temp 100.2 F (37.9 C) (Oral)   Resp 16   SpO2 95%   Visual Acuity Right Eye Distance:   Left Eye Distance:   Bilateral Distance:    Right Eye Near:   Left Eye Near:    Bilateral Near:     Physical Exam Vitals and nursing note reviewed.  Constitutional:      Appearance: He is not ill-appearing or toxic-appearing.  HENT:     Head: Normocephalic and atraumatic.     Right Ear: Hearing and external ear normal.     Left Ear: Hearing and external ear normal.     Nose: Nose normal.     Mouth/Throat:     Lips: Pink.     Mouth: Mucous membranes are moist. No  injury.     Tongue: No lesions. Tongue does not deviate from midline.     Palate: No mass and lesions.     Pharynx: Oropharynx  is clear. Uvula midline. No pharyngeal swelling, oropharyngeal exudate, posterior oropharyngeal erythema or uvula swelling.     Tonsils: No tonsillar exudate or tonsillar abscesses.  Eyes:     General: Lids are normal. Vision grossly intact. Gaze aligned appropriately.     Extraocular Movements: Extraocular movements intact.     Conjunctiva/sclera: Conjunctivae normal.  Cardiovascular:     Rate and Rhythm: Normal rate. Rhythm irregular.     Heart sounds: Normal heart sounds, S1 normal and S2 normal.     Comments: Multiple skipped beats with auscultation of heart sounds on exam.  Pulmonary:     Effort: Pulmonary effort is normal. No respiratory distress.     Breath sounds: Normal breath sounds and air entry.  Abdominal:     General: Abdomen is flat. Bowel sounds are normal.     Palpations: Abdomen is soft.     Tenderness: There is no abdominal tenderness. There is no right CVA tenderness or left CVA tenderness.  Musculoskeletal:     Cervical back: Neck supple.     Right lower leg: No edema.     Left lower leg: No edema.  Skin:    General: Skin is warm and dry.     Capillary Refill: Capillary refill takes less than 2 seconds.     Findings: No rash.  Neurological:     General: No focal deficit present.     Mental Status: He is alert and oriented to person, place, and time. Mental status is at baseline.     Cranial Nerves: No cranial nerve deficit, dysarthria or facial asymmetry.     Motor: No weakness.     Gait: Gait normal.  Psychiatric:        Mood and Affect: Mood normal.        Speech: Speech normal.        Behavior: Behavior normal.        Thought Content: Thought content normal.        Judgment: Judgment normal.      UC Treatments / Results  Labs (all labs ordered are listed, but only abnormal results are  displayed)   EKG   Radiology   Procedures Procedures (including critical care time)  Medications Ordered in UC Medications  acetaminophen (TYLENOL) tablet 975 mg (975 mg Oral Given 05/19/22 1801)  cefTRIAXone (ROCEPHIN) injection 1 g (1 g Intramuscular Given 05/19/22 1802)    Initial Impression / Assessment and Plan / UC Course  I have reviewed the triage vital signs and the nursing notes.  Pertinent labs & imaging results that were available during my care of the patient were reviewed by me and considered in my medical decision making (see chart for details).   1. Fever complicating acute cystitis, acute cystitis with hematuria, dysuria Urinalysis shows findings consistent with urinary tract infection. Urine culture pending. Patient with low grade fever currently at 100.2 and significant urinary symptoms. Low suspicion for prostatitis, pyelonephritis, and nephrolithiasis etiology. Abdominal exam is without peritoneal signs and he is tolerating food/fluids well without N/V. Will manage this with rocephin IM 1,000mg  and ciprofloxacin BID for 5 days. CBC and CMP pending, will call patient with any abnormalities indicating need for ER visit. Advised strict return precautions (ER and UC) if symptoms fail to improve in the next 12-24 hours.   Cytology swab is pending. Low suspicion for STD etiology after speaking with patient as he is in a monogamous relationship with wife. He is agreeable with still sending this swab for evaluation  in an effort to be thorough.   2. Heart palpitations Heart beat irregular when auscultating during exam. Patient initially denied heart palpitations, however states he can feel his heart skipping beats during lung exam and taking deep breaths. No chest pain, shortness of breath, or weakness. EKG shows elevated ventricular rate at 117 and machine read this as sinus tachycardia. On my interpretation, heart rhythm appears to be irregular and every third beat is  skipped. P waves are present, no evidence of atrial fibrillation. No signs of ST/T wave abnormalities. Elevated heart rate likely due to low grade fever and dehydration as well as possible electrolyte imbalance. Labs pending as stated above, will call patient with results indicating need for ER visit. Otherwise, advised to push fluids. He is agreeable with plan.  Discussed physical exam and available lab work findings in clinic with patient.  Counseled patient regarding appropriate use of medications and potential side effects for all medications recommended or prescribed today. Discussed red flag signs and symptoms of worsening condition,when to call the PCP office, return to urgent care, and when to seek higher level of care in the emergency department. Patient verbalizes understanding and agreement with plan. All questions answered. Patient discharged in stable condition.    Final Clinical Impressions(s) / UC Diagnoses   Final diagnoses:  Fever complicating acute cystitis  Acute cystitis with hematuria  Dysuria  Heart palpitations     Discharge Instructions      Your workup here urgent care shows that you have a urinary tract infection.  I gave you a shot of Rocephin in the clinic to help kick start treatment for this UTI, however your EKG shows an abnormal heart rhythm with multiple skipped beats.  I am concerned that the electrolytes in your body may be "out of whack" and you might be significantly dehydrated.  For these reasons, I believe that it would be safest for you to go to the nearest emergency room tonight to get a further workup and urgent blood work.   Please proceed to [facility] emergency department for further workup and evaluation.     ED Prescriptions     Medication Sig Dispense Auth. Provider   ciprofloxacin (CIPRO) 500 MG tablet  (Status: Discontinued) Take 1 tablet (500 mg total) by mouth every 12 (twelve) hours. 10 tablet Reita May M, FNP    ciprofloxacin (CIPRO) 500 MG tablet Take 1 tablet (500 mg total) by mouth every 12 (twelve) hours. 10 tablet Carlisle Beers, FNP      PDMP not reviewed this encounter.   Carlisle Beers, [place] 05/20/22 [phone]

## 2022-05-19 NOTE — Discharge Instructions (Addendum)
Your workup here urgent care shows that you have a urinary tract infection.  I gave you a shot of Rocephin in the clinic to help kick start treatment for this UTI, however your EKG shows an abnormal heart rhythm with multiple skipped beats.  I am concerned that the electrolytes in your body may be "out of whack" and you might be significantly dehydrated.  For these reasons, I believe that it would be safest for you to go to the nearest emergency room tonight to get a further workup and urgent blood work.   Please proceed to [facility] emergency department for further workup and evaluation.

## 2022-05-19 NOTE — ED Notes (Signed)
Unable to obtain labs. Attempted x 2 staff Mardene Celeste, NP notified.

## 2022-05-20 ENCOUNTER — Emergency Department (HOSPITAL_COMMUNITY): Payer: BC Managed Care – PPO

## 2022-05-20 DIAGNOSIS — K5730 Diverticulosis of large intestine without perforation or abscess without bleeding: Secondary | ICD-10-CM | POA: Diagnosis not present

## 2022-05-20 MED ORDER — ACETAMINOPHEN 500 MG PO TABS
1000.0000 mg | ORAL_TABLET | Freq: Four times a day (QID) | ORAL | Status: DC | PRN
  Administered 2022-05-20: 1000 mg via ORAL
  Filled 2022-05-20: qty 2

## 2022-05-20 MED ORDER — CEPHALEXIN 500 MG PO CAPS: 500.0000 mg | ORAL_CAPSULE | Freq: Two times a day (BID) | ORAL | 0 refills | Status: DC

## 2022-05-20 MED ORDER — CEPHALEXIN 500 MG PO CAPS: 500.0000 mg | ORAL_CAPSULE | Freq: Two times a day (BID) | ORAL | 0 refills | Status: AC

## 2022-05-20 MED ORDER — SODIUM CHLORIDE 0.9 % IV BOLUS
1000.0000 mL | Freq: Once | INTRAVENOUS | Status: AC
  Administered 2022-05-20: 1000 mL via INTRAVENOUS

## 2022-05-20 NOTE — Discharge Instructions (Addendum)
You have a UTI, I have started you on Keflex please take as prescribed, do not take the ciprofloxacin that was prescribed to you.  Like to follow-up with your primary doctor and/or urology for further evaluation  Come back to the emergency department if you develop chest pain, shortness of breath, severe abdominal pain, uncontrolled nausea, vomiting, diarrhea.

## 2022-05-20 NOTE — ED Provider Notes (Signed)
[place] EMERGENCY DEPARTMENT AT [facility] Provider Note   CSN: [id number] Arrival date & time: 05/19/22  2137     History  Chief Complaint  Patient presents with   Urinary Frequency    Daniel [name] is a 52 y.o. male.  HPI   Patient with medical history including hypertension, anxiety, arthritis, presenting with complaints of dysuria, started yesterday, states when he urinates he has a burning-like sensation, denies urinary frequency, difficulty with urination, no penile discharge, no testicular pain, does not endorse any suprapubic pain abdominal pain flank pain back pain nausea vomiting no associated fevers or chills.  States that he has had had UTIs in the past, he states he is not circumcised.  He was told by urgent care today that he did come to the hospital for further evaluation.    Home Medications Prior to Admission medications   Medication Sig Start Date End Date Taking? Authorizing Provider  acetaminophen (TYLENOL) 500 MG tablet Take 1,000 mg by mouth every 6 (six) hours as needed for mild pain or moderate pain.    [provider]  albuterol (VENTOLIN HFA) 108 (90 Base) MCG/ACT inhaler Inhale 1 puff into the lungs every 6 (six) hours as needed for wheezing or shortness of breath. 07/06/20   Hoy Register, MD  allopurinol (ZYLOPRIM) 300 MG tablet TAKE 1 TABLET BY MOUTH TWICE A DAY 02/16/22   Hoy Register, MD  amLODipine (NORVASC) 5 MG tablet Take 1 tablet (5 mg total) by mouth daily. 02/16/22   Hoy Register, MD  apixaban (ELIQUIS) 2.5 MG TABS tablet Take 1 tablet (2.5 mg total) by mouth 2 (two) times daily. 03/30/22 04/29/22  Clois Dupes, PA-C  carvedilol (COREG) 6.25 MG tablet Take 1 tablet (6.25 mg total) by mouth 2 (two) times daily with a meal. 02/14/22   Hoy Register, MD  celecoxib (CELEBREX) 200 MG capsule Take 200 mg by mouth daily. 02/07/22   [provider]  cephALEXin (KEFLEX) 500 MG capsule Take 1 capsule (500 mg total) by  mouth 2 (two) times daily for 7 days. 05/20/22 05/27/22  Carroll Sage, PA-C  hydrochlorothiazide (HYDRODIURIL) 25 MG tablet TAKE 1 TABLET (25 MG TOTAL) BY MOUTH DAILY. 02/16/22   Hoy Register, MD  methocarbamol (ROBAXIN) 500 MG tablet Take 1 tablet (500 mg total) by mouth every 6 (six) hours as needed for muscle spasms. 03/30/22   Hill, Alain Honey, PA-C  ondansetron (ZOFRAN) 4 MG tablet Take 1 tablet (4 mg total) by mouth every 8 (eight) hours as needed for nausea or vomiting. 03/30/22 03/30/23  Clois Dupes, PA-C  potassium chloride (KLOR-CON 10) 10 MEQ tablet Take 1 tablet (10 mEq total) by mouth daily. 01/10/22   Hoy Register, MD  Semaglutide,0.25 or 0.5MG /DOS, (OZEMPIC, 0.25 OR 0.5 MG/DOSE,) 2 MG/3ML SOPN Inject 0.25 mg into the skin once a week. 01/09/22   Hoy Register, MD  colchicine 0.6 MG tablet Take 2 tabs (1.2mg ) at the onset of a Gout flare, may repeat 1 tab (0.6mg ) after 2 hours if symptoms persist 09/30/19 04/12/20  Hoy Register, MD      Allergies    Patient has no known allergies.    Review of Systems   Review of Systems  Constitutional:  Negative for chills and fever.  Respiratory:  Negative for shortness of breath.   Cardiovascular:  Negative for chest pain.  Gastrointestinal:  Negative for abdominal pain.  Genitourinary:  Positive for dysuria.  Neurological:  Negative for headaches.  Physical Exam Updated Vital Signs BP 120/80   Pulse 93   Temp 98 F (36.7 C)   Resp 18   Ht 5\' 10"  (1.778 m)   Wt 114 kg   SpO2 95%   BMI 36.06 kg/m  Physical Exam Vitals and nursing note reviewed.  Constitutional:      General: He is not in acute distress.    Appearance: He is not ill-appearing.  HENT:     Head: Normocephalic and atraumatic.     Nose: No congestion.  Eyes:     Conjunctiva/sclera: Conjunctivae normal.  Cardiovascular:     Rate and Rhythm: Regular rhythm. Tachycardia present.     Pulses: Normal pulses.     Heart sounds: No murmur heard.    No friction  rub. No gallop.  Pulmonary:     Effort: No respiratory distress.     Breath sounds: No wheezing, rhonchi or rales.  Abdominal:     Palpations: Abdomen is soft.     Tenderness: There is no abdominal tenderness. There is no right CVA tenderness or left CVA tenderness.     Comments: Abdomen nondistended, soft nontender to palpation.  Skin:    General: Skin is warm and dry.  Neurological:     Mental Status: He is alert.  Psychiatric:        Mood and Affect: Mood normal.     ED Results / Procedures / Treatments   Labs (all labs ordered are listed, but only abnormal results are displayed) Labs Reviewed  COMPREHENSIVE METABOLIC PANEL - Abnormal; Notable for the following components:      Result Value   Sodium 133 (*)    Potassium 3.2 (*)    Chloride 95 (*)    Glucose, Bld 105 (*)    All other components within normal limits  CBC - Abnormal; Notable for the following components:   WBC 17.3 (*)    Hemoglobin 12.9 (*)    HCT 37.7 (*)    RDW 15.6 (*)    All other components within normal limits  URINALYSIS, ROUTINE W REFLEX MICROSCOPIC - Abnormal; Notable for the following components:   Hgb urine dipstick MODERATE (*)    Leukocytes,Ua MODERATE (*)    Bacteria, UA RARE (*)    All other components within normal limits  URINE CULTURE  LIPASE, BLOOD    EKG None  Radiology CT Renal Stone Study  Result Date: 05/20/2022 CLINICAL DATA:  Flank pain EXAM: CT ABDOMEN AND PELVIS WITHOUT CONTRAST TECHNIQUE: Multidetector CT imaging of the abdomen and pelvis was performed following the standard protocol without IV contrast. RADIATION DOSE REDUCTION: This exam was performed according to the departmental dose-optimization program which includes automated exposure control, adjustment of the mA and/or kV according to patient size and/or use of iterative reconstruction technique. COMPARISON:  08/15/2017 FINDINGS: Lower chest: Mild scarring is noted in the left base. Hepatobiliary: Liver is within  normal limits. Gallbladder is well distended with a single dependent gallstone. No complicating factors are noted. Pancreas: Unremarkable. No pancreatic ductal dilatation or surrounding inflammatory changes. Spleen: Normal in size without focal abnormality. Adrenals/Urinary Tract: Adrenal glands are within normal limits. Kidneys show no renal calculi or obstructive changes. Bladder is decompressed. Stomach/Bowel: Scattered diverticular change the colon is noted without evidence of diverticulitis. The appendix is within normal limits. Small bowel and stomach are within normal limits. Vascular/Lymphatic: Aortic atherosclerosis. No enlarged abdominal or pelvic lymph nodes. Reproductive: Prostate is unremarkable. Other: No abdominal wall hernia or abnormality. No abdominopelvic  ascites. Musculoskeletal: Bilateral hip replacements are seen. Degenerative changes of lumbar spine are noted. IMPRESSION: Cholelithiasis without complicating factors. Diverticulosis without diverticulitis. Electronically Signed   By: Alcide Clever M.D.   On: 05/20/2022 02:35    Procedures Procedures    Medications Ordered in ED Medications  acetaminophen (TYLENOL) tablet 1,000 mg (1,000 mg Oral Given 05/20/22 0414)  sodium chloride 0.9 % bolus 1,000 mL (0 mLs Intravenous Stopped 05/20/22 0440)    ED Course/ Medical Decision Making/ A&P                             Medical Decision Making Amount and/or Complexity of Data Reviewed Labs: ordered. Radiology: ordered.  Risk OTC drugs. Prescription drug management.   This patient presents to the ED for concern of UTI, this involves an extensive number of treatment options, and is a complaint that carries with it a high risk of complications and morbidity.  The differential diagnosis includes pyelo-, kidney stone, sepsis    Additional history obtained:  Additional history obtained from partner at bedside External records from outside source obtained and reviewed including  urgent care notes   Co morbidities that complicate the patient evaluation  Hypertension  Social Determinants of Health:  N/a    Lab Tests:  I Ordered, and personally interpreted labs.  The pertinent results include: CBC shows leukocytosis 17.3, hemoglobin 12.9, CMP reveals sodium 133 potassium 3.2 chloride 95 glucose 105, lipase 30, UA shows moderate leukocytes no red blood cells white blood cells rare bacteria   Imaging Studies ordered:  I ordered imaging studies including CT renal I independently visualized and interpreted imaging which showed negative for acute findings I agree with the radiologist interpretation   Cardiac Monitoring:  The patient was maintained on a cardiac monitor.  I personally viewed and interpreted the cardiac monitored which showed an underlying rhythm of: n/a   Medicines ordered and prescription drug management:  I ordered medication including fluids I have reviewed the patients home medicines and have made adjustments as needed  Critical Interventions:  N/A   Reevaluation:  Presents from urgent care for concern of UTI, initially presenting tachycardic, on my evaluation heart rate in the mid 90s, hemodynamically stable, lab work shows leukocytosis UA concerning for possible UTI.  Is atypical for male to get UTIs, he has a elevated white count, will obtain CT renal rule out possible stone versus pyelo-.   Reassessed resting comfortably agreement with discharge at this time   Consultations Obtained:  N/a    Test Considered:  N/a    Rule out low suspicion for lower lobe pneumonia as lung sounds are clear bilaterally, will defer imaging at this time.  I have low suspicion for liver or gallbladder abnormality as she has no right upper quadrant tenderness, liver enzymes, alk phos, T bili all within normal limits.  Low suspicion for pancreatitis as lipase is within normal limits.  Low suspicion for ruptured stomach ulcer as she has no  peritoneal sign present on exam.  Suspicion for bowel obstruction, volvulus, diverticulitis, kidney stone, Pilo low at this time as CT imaging is negative.    Dispostion and problem list  After consideration of the diagnostic results and the patients response to treatment, I feel that the patent would benefit from discharge.  UTI-patient was given 1 g of ceftriaxone at the urgent care, and was discharged home on ciprofloxacin, this has blackbox warning of tendon rupture as well as increased  risk of C. difficile, patient is nontoxic-appearing, will switch to Keflex, follow-up with urology as well as PCP for further assessment.            Final Clinical Impression(s) / ED Diagnoses Final diagnoses:  Cystitis    Rx / DC Orders ED Discharge Orders          Ordered    cephALEXin (KEFLEX) 500 MG capsule  2 times daily,   Status:  Discontinued        05/20/22 0306    cephALEXin (KEFLEX) 500 MG capsule  2 times daily,   Status:  Discontinued        05/20/22 0437    cephALEXin (KEFLEX) 500 MG capsule  2 times daily        05/20/22 0438              Carroll Sage, PA-C 05/20/22 6045    Palumbo, April, MD 05/20/22 [phone]

## 2022-05-21 LAB — URINE CULTURE: Culture: 80000 — AB

## 2022-05-22 DIAGNOSIS — M1612 Unilateral primary osteoarthritis, left hip: Secondary | ICD-10-CM | POA: Diagnosis not present

## 2022-05-22 DIAGNOSIS — Z79899 Other long term (current) drug therapy: Secondary | ICD-10-CM | POA: Diagnosis not present

## 2022-05-22 DIAGNOSIS — Z5181 Encounter for therapeutic drug level monitoring: Secondary | ICD-10-CM | POA: Diagnosis not present

## 2022-05-22 LAB — CYTOLOGY, (ORAL, ANAL, URETHRAL) ANCILLARY ONLY
Chlamydia: NEGATIVE
Comment: NEGATIVE
Comment: NEGATIVE
Comment: NORMAL
Neisseria Gonorrhea: NEGATIVE
Trichomonas: NEGATIVE

## 2022-05-22 LAB — URINE CULTURE

## 2022-05-23 LAB — URINE CULTURE

## 2022-05-24 ENCOUNTER — Telehealth (HOSPITAL_BASED_OUTPATIENT_CLINIC_OR_DEPARTMENT_OTHER): Payer: Self-pay | Admitting: *Deleted

## 2022-05-24 NOTE — Telephone Encounter (Signed)
Post ED Visit - Positive Culture Follow-up: Unsuccessful Patient Follow-up  Culture assessed and recommendations reviewed by:  [x]  Francene Finders Dohlen, Pharm.D. []  Celedonio Miyamoto, Pharm.D., BCPS AQ-ID []  Garvin Fila, Pharm.D., BCPS []  Georgina Pillion, Pharm.D., BCPS []  [place], [place].D., BCPS, AAHIVP []  Estella Husk, Pharm.D., BCPS, AAHIVP []  Sherlynn Carbon, PharmD []  Pollyann Samples, PharmD, BCPS  Positive urine culture  []  Patient discharged without antimicrobial prescription and treatment is now indicated [x]  Organism is resistant to prescribed ED discharge antimicrobial []  Patient with positive blood cultures  Plan is to if no UTI S/S complete cephalexin. If symptoms are present stop cephalexin and start augmentin 875mg  PO BID x 7 days per ED provider Daniel Bucco, Daniel [name]   Unable to contact patient after 3 attempts, letter will be sent to address on file  Bing Quarry 05/24/2022, 11:29 AM

## 2022-05-24 NOTE — Progress Notes (Signed)
ED Antimicrobial Stewardship Positive Culture Follow Up   Daniel [name] is an 52 y.o. male who presented to [facility] on 05/19/2022 with a chief complaint of  Chief Complaint  Patient presents with   Urinary Frequency    Recent Results (from the past 720 hour(s))  Urine Culture     Status: Abnormal   Collection Time: 05/19/22  5:48 PM   Specimen: Urine, Clean Catch  Result Value Ref Range Status   Specimen Description URINE, CLEAN CATCH  Final   Special Requests   Final    NONE Performed at [facility], [address], [place], [place] [address]    Culture 80,000 COLONIES/mL ESCHERICHIA COLI (A)  Final   Report Status 05/21/2022 FINAL  Final   Organism ID, Bacteria ESCHERICHIA COLI (A)  Final      Susceptibility   Escherichia coli - MIC*    AMPICILLIN 8 SENSITIVE Sensitive     CEFAZOLIN <=4 SENSITIVE Sensitive     CEFEPIME <=0.12 SENSITIVE Sensitive     CEFTRIAXONE <=0.25 SENSITIVE Sensitive     CIPROFLOXACIN <=0.25 SENSITIVE Sensitive     GENTAMICIN <=1 SENSITIVE Sensitive     IMIPENEM <=0.25 SENSITIVE Sensitive     NITROFURANTOIN <=16 SENSITIVE Sensitive     TRIMETH/SULFA <=20 SENSITIVE Sensitive     AMPICILLIN/SULBACTAM <=2 SENSITIVE Sensitive     PIP/TAZO <=4 SENSITIVE Sensitive     * 80,000 COLONIES/mL ESCHERICHIA COLI  Urine Culture     Status: Abnormal   Collection Time: 05/19/22 11:01 PM   Specimen: Urine, Clean Catch  Result Value Ref Range Status   Specimen Description URINE, CLEAN CATCH  Final   Special Requests   Final    NONE Performed at [facility], [address] [place], [place] [address]    Culture 20,000 COLONIES/mL ENTEROCOCCUS FAECALIS (A)  Final   Report Status 05/23/2022 FINAL  Final   Organism ID, Bacteria ENTEROCOCCUS FAECALIS (A)  Final      Susceptibility   Enterococcus faecalis - MIC*    AMPICILLIN <=2 SENSITIVE Sensitive     NITROFURANTOIN <=16 SENSITIVE Sensitive     VANCOMYCIN 1 SENSITIVE Sensitive     * 20,000  COLONIES/mL ENTEROCOCCUS FAECALIS    [x]  Treated with cephalexin, organism resistant to prescribed antimicrobial  New antibiotic prescription: Flow Manager to call patient. If feeling better with no further urinary symptoms, complete full cephalexin course as previously prescribed. If urinary symptoms remain, stop cephalexin and start Augmentin 875mg  PO BID x 7 days.  ED Provider: Rolan Bucco, MD   Susa Griffins Dohlen 05/24/2022, 8:12 AM Clinical Pharmacist Monday - Friday phone -  [phone] Saturday - Sunday phone - [phone]

## 2022-06-02 NOTE — Telephone Encounter (Signed)
Post ED Visit - Positive Culture Follow-up: Successful Patient Follow-Up  Culture assessed and recommendations reviewed by:  [x]  Leia Alf, Pharm.D. []  Celedonio Miyamoto, Pharm.D., BCPS AQ-ID []  Garvin Fila, Pharm.D., BCPS []  Georgina Pillion, Pharm.D., BCPS []  [place], [address].D., BCPS, AAHIVP []  Estella Husk, Pharm.D., BCPS, AAHIVP []  Lysle Pearl, PharmD, BCPS []  Phillips Climes, PharmD, BCPS []  Agapito Games, PharmD, BCPS []  Verlan Friends, PharmD  Positive urine culture  []  Patient discharged without antimicrobial prescription and treatment is now indicated [x]  Organism is resistant to prescribed ED discharge antimicrobial []  Patient with positive blood cultures  Changes discussed with ED provider: Rolan Bucco, MD  Plan: contact pt, if feeling better with no urinary symptoms complete full Cephalexon course as prescribed. If urinary sympotms remain stop Cephalexin and start Augmenton 875 mg po BID x 7 days.   Pt called back and states he is improving and does not have any symptoms. Instructed pt to complete Cephalexon as instructed.   Contacted patient, date 06/02/2022, time 11:00 am   Sandria Senter 06/02/2022, 11:09 AM

## 2022-06-09 ENCOUNTER — Other Ambulatory Visit: Payer: Self-pay

## 2022-06-09 ENCOUNTER — Other Ambulatory Visit: Payer: Self-pay | Admitting: Family Medicine

## 2022-06-09 MED ORDER — WEGOVY 0.25 MG/0.5ML ~~LOC~~ SOAJ
0.2500 mg | SUBCUTANEOUS | 6 refills | Status: DC
  Filled 2022-06-09: qty 2, 28d supply, fill #0
  Filled 2022-07-02: qty 2, 28d supply, fill #1
  Filled 2022-07-30: qty 2, 28d supply, fill #2

## 2022-06-15 ENCOUNTER — Other Ambulatory Visit: Payer: Self-pay | Admitting: Family Medicine

## 2022-06-15 DIAGNOSIS — I10 Essential (primary) hypertension: Secondary | ICD-10-CM

## 2022-06-15 DIAGNOSIS — M1A00X0 Idiopathic chronic gout, unspecified site, without tophus (tophi): Secondary | ICD-10-CM

## 2022-06-16 NOTE — Telephone Encounter (Signed)
Requested Prescriptions  Pending Prescriptions Disp Refills   amLODipine (NORVASC) 5 MG tablet [Pharmacy Med Name: AMLODIPINE BESYLATE 5 MG TAB] 90 tablet 0    Sig: TAKE 1 TABLET (5 MG TOTAL) BY MOUTH DAILY.     Cardiovascular: Calcium Channel Blockers 2 Passed - 06/15/2022  6:28 PM      Passed - Last BP in normal range    BP Readings from Last 1 Encounters:  05/20/22 120/80         Passed - Last Heart Rate in normal range    Pulse Readings from Last 1 Encounters:  05/20/22 93         Passed - Valid encounter within last 6 months    Recent Outpatient Visits           5 months ago Essential hypertension   [facility] [place], [place], MD   8 months ago Pre-op evaluation   [place] [facility] [place], Odette Horns, MD   11 months ago Prediabetes   [place] [facility] [place], Odette Horns, MD   1 year ago Impetigo   [place] [facility] [place], Odette Horns, MD   1 year ago Prediabetes   [facility] Hoy Register, MD       Future Appointments             In 3 weeks Storm Frisk, MD [place] [facility]             allopurinol (ZYLOPRIM) 300 MG tablet [Pharmacy Med Name: ALLOPURINOL 300 MG TABLET] 180 tablet 1    Sig: TAKE 1 TABLET BY MOUTH TWICE A DAY     Endocrinology:  Gout Agents - allopurinol Failed - 06/15/2022  6:28 PM      Failed - Uric Acid in normal range and within 360 days    Uric Acid  Date Value Ref Range Status  05/28/2019 10.8 (H) 3.8 - 8.4 mg/dL Final    Comment:               Therapeutic target for gout patients: <6.0         Failed - CBC within normal limits and completed in the last 12 months    WBC  Date Value Ref Range Status  05/19/2022 17.3 (H) 4.0 - 10.5 K/uL Final   RBC  Date Value Ref Range Status  05/19/2022 4.22 4.22 - 5.81 MIL/uL Final   Hemoglobin  Date Value Ref  Range Status  05/19/2022 12.9 (L) 13.0 - 17.0 g/dL Final  16/10/9602 54.0 13.0 - 17.7 g/dL Final   HCT  Date Value Ref Range Status  05/19/2022 37.7 (L) 39.0 - 52.0 % Final   Hematocrit  Date Value Ref Range Status  05/28/2019 43.2 37.5 - 51.0 % Final   MCHC  Date Value Ref Range Status  05/19/2022 34.2 30.0 - 36.0 g/dL Final   [facility]  Date Value Ref Range Status  05/19/2022 30.6 26.0 - 34.0 pg Final   MCV  Date Value Ref Range Status  05/19/2022 89.3 80.0 - 100.0 fL Final  05/28/2019 96 79 - 97 fL Final   No results found for: "PLTCOUNTKUC", "LABPLAT", "POCPLA" RDW  Date Value Ref Range Status  05/19/2022 15.6 (H) 11.5 - 15.5 % Final  05/28/2019 15.5 (H) 11.6 - 15.4 % Final         Passed - Cr in  normal range and within 360 days    Creat  Date Value Ref Range Status  04/26/2021 0.84 0.60 - 1.29 mg/dL Final   Creatinine, Ser  Date Value Ref Range Status  05/19/2022 0.99 0.61 - 1.24 mg/dL Final         Passed - Valid encounter within last 12 months    Recent Outpatient Visits           5 months ago Essential hypertension   [facility] H[facility] Register, MD   8 months ago Pre-op evaluation   [place] [facility], MD   11 months ago Prediabetes   [facility] Hoy [facility], MD   1 year ago Impetigo   [facility] Hoy Register, MD   1 year ago Prediabetes   [facility] Hoy Register, MD       Future Appointments             In 3 weeks Storm Frisk, MD [place] [facility]             hydrochlorothiazide (HYDRODIURIL) 25 MG tablet [Pharmacy Med Name: HYDROCHLOROTHIAZIDE 25 MG TAB] 90 tablet 1    Sig: TAKE 1 TABLET (25 MG TOTAL) BY MOUTH DAILY.     Cardiovascular: Diuretics - Thiazide Failed - 06/15/2022  6:28 PM      Failed - K in normal  range and within 180 days    Potassium  Date Value Ref Range Status  05/19/2022 3.2 (L) 3.5 - 5.1 mmol/L Final         Failed - Na in normal range and within 180 days    Sodium  Date Value Ref Range Status  05/19/2022 133 (L) 135 - 145 mmol/L Final  01/09/2022 137 134 - 144 mmol/L Final         Passed - Cr in normal range and within 180 days    Creat  Date Value Ref Range Status  04/26/2021 0.84 0.60 - 1.29 mg/dL Final   Creatinine, Ser  Date Value Ref Range Status  05/19/2022 0.99 0.61 - 1.24 mg/dL Final         Passed - Last BP in normal range    BP Readings from Last 1 Encounters:  05/20/22 120/80         Passed - Valid encounter within last 6 months    Recent Outpatient Visits           5 months ago Essential hypertension   [place] [facility] [place], Odette Horns, MD   8 months ago Pre-op evaluation   [place] [facility] Hoy Register, MD   11 months ago Prediabetes   [place] [facility] Hoy Register, MD   1 year ago Impetigo   [place] [facility] Hoy Register, MD   1 year ago Prediabetes   [place] [facility] Hoy Register, MD       Future Appointments             In 3 weeks Storm Frisk, MD [facility]

## 2022-06-16 NOTE — Telephone Encounter (Signed)
Request too early. Requested Prescriptions  Signed Prescriptions Disp Refills   amLODipine (NORVASC) 5 MG tablet 90 tablet 0    Sig: TAKE 1 TABLET (5 MG TOTAL) BY MOUTH DAILY.     Cardiovascular: Calcium Channel Blockers 2 Passed - 06/15/2022  6:28 PM      Passed - Last BP in normal range    BP Readings from Last 1 Encounters:  05/20/22 120/80         Passed - Last Heart Rate in normal range    Pulse Readings from Last 1 Encounters:  05/20/22 93         Passed - Valid encounter within last 6 months    Recent Outpatient Visits           5 months ago Essential hypertension   [place] [facility] [place], [place], MD   8 months ago Pre-op evaluation   [place] [facility] [place], Odette Horns, MD   11 months ago Prediabetes   [place] [facility] [place], Odette Horns, MD   1 year ago Impetigo   [place] [facility] [place], Odette Horns, MD   1 year ago Prediabetes   [place] Hoy Register, MD       Future Appointments             In 3 weeks Storm Frisk, MD  [facility]            Refused Prescriptions Disp Refills   allopurinol (ZYLOPRIM) 300 MG tablet [Pharmacy Med Name: ALLOPURINOL 300 MG TABLET] 180 tablet 1    Sig: TAKE 1 TABLET BY MOUTH TWICE A DAY     Endocrinology:  Gout Agents - allopurinol Failed - 06/15/2022  6:28 PM      Failed - Uric Acid in normal range and within 360 days    Uric Acid  Date Value Ref Range Status  05/28/2019 10.8 (H) 3.8 - 8.4 mg/dL Final    Comment:               Therapeutic target for gout patients: <6.0         Failed - CBC within normal limits and completed in the last 12 months    WBC  Date Value Ref Range Status  05/19/2022 17.3 (H) 4.0 - 10.5 K/uL Final   RBC  Date Value Ref Range Status  05/19/2022 4.22 4.22 - 5.81 MIL/uL Final   Hemoglobin  Date  Value Ref Range Status  05/19/2022 12.9 (L) 13.0 - 17.0 g/dL Final  16/10/9602 54.0 13.0 - 17.7 g/dL Final   HCT  Date Value Ref Range Status  05/19/2022 37.7 (L) 39.0 - 52.0 % Final   Hematocrit  Date Value Ref Range Status  05/28/2019 43.2 37.5 - 51.0 % Final   MCHC  Date Value Ref Range Status  05/19/2022 34.2 30.0 - 36.0 g/dL Final   [facility]  Date Value Ref Range Status  05/19/2022 30.6 26.0 - 34.0 pg Final   MCV  Date Value Ref Range Status  05/19/2022 89.3 80.0 - 100.0 fL Final  05/28/2019 96 79 - 97 fL Final   No results found for: "PLTCOUNTKUC", "LABPLAT", "POCPLA" RDW  Date Value Ref Range Status  05/19/2022 15.6 (H) 11.5 - 15.5 % Final  05/28/2019 15.5 (H) 11.6 - 15.4 % Final         Passed - Cr in  normal range and within 360 days    Creat  Date Value Ref Range Status  04/26/2021 0.84 0.60 - 1.29 mg/dL Final   Creatinine, Ser  Date Value Ref Range Status  05/19/2022 0.99 0.61 - 1.24 mg/dL Final         Passed - Valid encounter within last 12 months    Recent Outpatient Visits           5 months ago Essential hypertension   [place] [facility] Hoy Register, MD   8 months ago Pre-op evaluation   [place] [facility] Hoy Register, MD   11 months ago Prediabetes   [place] [facility] Hoy Register, MD   1 year ago Impetigo   [place] [facility] Hoy Register, MD   1 year ago Prediabetes   [place] [facility] Hoy Register, MD       Future Appointments             In 3 weeks Storm Frisk, MD [place] [facility]             hydrochlorothiazide (HYDRODIURIL) 25 MG tablet [Pharmacy Med Name: HYDROCHLOROTHIAZIDE 25 MG TAB] 90 tablet 1    Sig: TAKE 1 TABLET (25 MG TOTAL) BY MOUTH DAILY.     Cardiovascular: Diuretics - Thiazide Failed - 06/15/2022  6:28 PM      Failed - K  in normal range and within 180 days    Potassium  Date Value Ref Range Status  05/19/2022 3.2 (L) 3.5 - 5.1 mmol/L Final         Failed - Na in normal range and within 180 days    Sodium  Date Value Ref Range Status  05/19/2022 133 (L) 135 - 145 mmol/L Final  01/09/2022 137 134 - 144 mmol/L Final         Passed - Cr in normal range and within 180 days    Creat  Date Value Ref Range Status  04/26/2021 0.84 0.60 - 1.29 mg/dL Final   Creatinine, Ser  Date Value Ref Range Status  05/19/2022 0.99 0.61 - 1.24 mg/dL Final         Passed - Last BP in normal range    BP Readings from Last 1 Encounters:  05/20/22 120/80         Passed - Valid encounter within last 6 months    Recent Outpatient Visits           5 months ago Essential hypertension   [place] [facility] [place], Odette Horns, MD   8 months ago Pre-op evaluation   [place] [facility] Hoy Register, MD   11 months ago Prediabetes   [place] [facility] Hoy Register, MD   1 year ago Impetigo   [place] [facility] Hoy Register, MD   1 year ago Prediabetes    [facility] Hoy Register, MD       Future Appointments             In 3 weeks Storm Frisk, MD [facility]

## 2022-06-20 DIAGNOSIS — Z96643 Presence of artificial hip joint, bilateral: Secondary | ICD-10-CM | POA: Diagnosis not present

## 2022-06-28 DIAGNOSIS — M25551 Pain in right hip: Secondary | ICD-10-CM | POA: Diagnosis not present

## 2022-06-28 DIAGNOSIS — M545 Low back pain, unspecified: Secondary | ICD-10-CM | POA: Diagnosis not present

## 2022-07-02 ENCOUNTER — Other Ambulatory Visit: Payer: Self-pay | Admitting: Family Medicine

## 2022-07-04 ENCOUNTER — Other Ambulatory Visit: Payer: Self-pay

## 2022-07-07 ENCOUNTER — Other Ambulatory Visit: Payer: Self-pay

## 2022-07-10 ENCOUNTER — Emergency Department (HOSPITAL_COMMUNITY)
Admission: EM | Admit: 2022-07-10 | Discharge: 2022-07-10 | Discharge status: Against Medical Advice | Payer: BC Managed Care – PPO

## 2022-07-10 NOTE — ED Notes (Signed)
Pt states that he no longer wants to be seen

## 2022-07-11 ENCOUNTER — Ambulatory Visit: Payer: BC Managed Care – PPO | Admitting: Family Medicine

## 2022-07-13 ENCOUNTER — Ambulatory Visit: Payer: BC Managed Care – PPO | Attending: Family Medicine | Admitting: Critical Care Medicine

## 2022-07-13 ENCOUNTER — Encounter: Payer: Self-pay | Admitting: Critical Care Medicine

## 2022-07-13 VITALS — BP 110/71 | HR 96 | Ht 70.0 in | Wt 260.8 lb

## 2022-07-13 DIAGNOSIS — E876 Hypokalemia: Secondary | ICD-10-CM | POA: Diagnosis not present

## 2022-07-13 DIAGNOSIS — I10 Essential (primary) hypertension: Secondary | ICD-10-CM

## 2022-07-13 NOTE — Progress Notes (Signed)
Established Patient Office Visit  Subjective   Patient ID: Daniel [name], male    DOB: Aug 05, 1970  Age: 52 y.o. MRN: [id number]  Chief Complaint  Patient presents with   Hypertension    52 y.o.M [place] PCP This patient seen in follow-up needs to have his potassium rechecked and assessment for blood pressure medications.  Patient last seen in December 2023 by his primary care provider as noted below. Last ov 12/2021 . Essential hypertension Controlled Continue current regimen Counseled on blood pressure goal of less than 130/80, low-sodium, DASH diet, medication compliance, 150 minutes of moderate intensity exercise per week. Discussed medication compliance, adverse effects. - LP+Non-HDL Cholesterol - CMP14+EGFR   2. Prediabetes Controlled with A1c of 5.9 Due to decreased appetite I have decreased his Ozempic dose from 0.5 mg to 0.25 mg Continue to work on lifestyle modifications to prevent progression to type 2 diabetes mellitus - Semaglutide,0.25 or 0.5MG /DOS, (OZEMPIC, 0.25 OR 0.5 MG/DOSE,) 2 MG/3ML SOPN; Inject 0.25 mg into the skin once a week.  Dispense: 3 mL; Refill: 6   3. Screening for colon cancer Due to the fact that he just had hip surgery he will not be able to undergo colonoscopy hence Cologuard has been ordered - Cologuard   4. Tobacco abuse Spent 3 minutes counseling on smoking cessation but he is not ready to quit He has smoked less than 20-year pack history and does not qualify for lung cancer screening   5. 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  He is still taking low-dose opiates managed by orthopedics.  He is on hydrochlorothiazide which may have been leading to the low potassium.  Blood pressure excellent today 110/71.  He states he did not get the colon cancer screening kit as of yet.  There are no other  complaints.      Review of Systems  Constitutional:  Negative for chills, diaphoresis, fever, malaise/fatigue and weight loss.  HENT:  Negative for congestion, hearing loss, nosebleeds, sore throat and tinnitus.   Eyes:  Negative for blurred vision, photophobia and redness.  Respiratory:  Negative for cough, hemoptysis, sputum production, shortness of breath, wheezing and stridor.   Cardiovascular:  Negative for chest pain, palpitations, orthopnea, claudication, leg swelling and PND.  Gastrointestinal:  Negative for abdominal pain, blood in stool, constipation, diarrhea, heartburn, nausea and vomiting.  Genitourinary:  Negative for dysuria, flank pain, frequency, hematuria and urgency.  Musculoskeletal:  Positive for joint pain. Negative for back pain, falls, myalgias and neck pain.  Skin:  Negative for itching and rash.  Neurological:  Negative for dizziness, tingling, tremors, sensory change, speech change, focal weakness, seizures, loss of consciousness, weakness and headaches.  Endo/Heme/Allergies:  Negative for environmental allergies and polydipsia. Does not bruise/bleed easily.  Psychiatric/Behavioral:  Negative for depression, memory loss, substance abuse and suicidal ideas. The patient is not nervous/anxious and does not have insomnia.       Objective:     BP 110/71 (BP Location: Left Arm, Patient Position: Sitting, Cuff Size: Normal)   Pulse 96   Ht 5\' 10"  (1.778 m)   Wt 260 lb 12.8 oz (118.3 kg)   SpO2 99%   BMI 37.42 kg/m    Physical Exam Vitals reviewed.  Constitutional:      Appearance: Normal appearance. He is well-developed. He is obese. He is not diaphoretic.  HENT:     Head: Normocephalic and atraumatic.     Nose: No nasal deformity, septal deviation, mucosal edema or rhinorrhea.     Right Sinus: No maxillary sinus tenderness or frontal sinus tenderness.     Left Sinus: No maxillary sinus tenderness or frontal sinus tenderness.     Mouth/Throat:      Pharynx: No oropharyngeal exudate.  Eyes:     General: No scleral icterus.    Conjunctiva/sclera: Conjunctivae normal.     Pupils: Pupils are equal, round, and reactive to light.  Neck:     Thyroid: No thyromegaly.     Vascular: No carotid bruit or JVD.     Trachea: Trachea normal. No tracheal tenderness or tracheal deviation.  Cardiovascular:     Rate and Rhythm: Normal rate and regular rhythm.     Chest Wall: PMI is not displaced.     Pulses: Normal pulses. No decreased pulses.     Heart sounds: Normal heart sounds, S1 normal and S2 normal. Heart sounds not distant. No murmur heard.    No systolic murmur is present.     No diastolic murmur is present.     No friction rub. No gallop. No S3 or S4 sounds.  Pulmonary:     Effort: No tachypnea, accessory muscle usage or respiratory distress.     Breath sounds: No stridor. No decreased breath sounds, wheezing, rhonchi or rales.  Chest:     Chest wall: No tenderness.  Abdominal:     General: Bowel sounds are normal. There is no distension.     Palpations: Abdomen is soft. Abdomen is not rigid.     Tenderness: There is no abdominal tenderness. There is no guarding or rebound.  Musculoskeletal:        General: Normal range of motion.     Cervical back: Normal range of motion and neck supple. No edema, erythema or rigidity. No muscular tenderness. Normal range of motion.  Lymphadenopathy:     Head:     Right side of head: No submental or submandibular adenopathy.     Left side of head: No submental or submandibular adenopathy.     Cervical: No cervical adenopathy.  Skin:    General: Skin is warm and dry.     Coloration: Skin is not pale.     Findings: No rash.     Nails: There is no clubbing.  Neurological:     Mental Status: He is alert and oriented to person, place, and time.     Sensory: No sensory deficit.  Psychiatric:        Speech: Speech normal.        Behavior: Behavior normal.      No results found for any visits on  07/13/22.    The 10-year ASCVD risk score (Arnett DK, et al., 2019) is: 19.8%    Assessment & Plan:   Problem List Items Addressed This Visit       Cardiovascular and Mediastinum   HTN (hypertension) - Primary    Well-controlled concern hydrochlorothiazide is causing potassium loss  Will discontinue hydrochlorothiazide bring patient back in 2 weeks for a blood pressure check with RN  Will check potassium levels and if normal will discontinue further potassium  Patient to continue amlodipine and carvedilol as prescribed      Relevant Orders   BMP8+eGFR   Recheck vitals   Other Visit Diagnoses     Hypokalemia       Relevant Orders  BMP8+eGFR       No follow-ups on file.    Shan Levans, MD

## 2022-07-13 NOTE — Patient Instructions (Signed)
Stop hydrochlorothiazide  Labs today will be a metabolic panel  No other medication changes  Based on lab results we likely will have you stop potassium supplement  Return for nurse blood pressure check 2 weeks  Keep follow-up appointment as already scheduled with your primary care provider Dr. Alvis Lemmings

## 2022-07-13 NOTE — Assessment & Plan Note (Signed)
Well-controlled concern hydrochlorothiazide is causing potassium loss  Will discontinue hydrochlorothiazide bring patient back in 2 weeks for a blood pressure check with RN  Will check potassium levels and if normal will discontinue further potassium  Patient to continue amlodipine and carvedilol as prescribed

## 2022-07-14 LAB — BMP8+EGFR
BUN/Creatinine Ratio: 9 (ref 9–20)
BUN: 8 mg/dL (ref 6–24)
CO2: 26 mmol/L (ref 20–29)
Calcium: 9.8 mg/dL (ref 8.7–10.2)
Chloride: 100 mmol/L (ref 96–106)
Creatinine, Ser: 0.86 mg/dL (ref 0.76–1.27)
Glucose: 103 mg/dL — ABNORMAL HIGH (ref 70–99)
Potassium: 4.1 mmol/L (ref 3.5–5.2)
Sodium: 139 mmol/L (ref 134–144)
eGFR: 106 mL/min/{1.73_m2} (ref >59)

## 2022-07-15 NOTE — Progress Notes (Signed)
Let pt know labs normal

## 2022-07-17 ENCOUNTER — Ambulatory Visit: Payer: BC Managed Care – PPO | Admitting: Family Medicine

## 2022-07-20 ENCOUNTER — Telehealth: Payer: Self-pay

## 2022-07-20 NOTE — Telephone Encounter (Signed)
Called patient and he is aware. 

## 2022-07-20 NOTE — Addendum Note (Signed)
Addended by: Shan Levans E on: 07/20/2022 12:42 PM   Modules accepted: Orders

## 2022-07-20 NOTE — Telephone Encounter (Signed)
-----   Message from Storm Frisk, MD sent at 07/15/2022  6:55 AM EDT ----- Let pt know labs normal

## 2022-07-20 NOTE — Telephone Encounter (Signed)
He can stop potassium

## 2022-07-31 ENCOUNTER — Other Ambulatory Visit: Payer: Self-pay

## 2022-08-01 ENCOUNTER — Ambulatory Visit: Payer: Medicaid Other | Attending: Family Medicine

## 2022-08-01 ENCOUNTER — Other Ambulatory Visit: Payer: Self-pay

## 2022-08-01 DIAGNOSIS — I10 Essential (primary) hypertension: Secondary | ICD-10-CM

## 2022-08-01 MED ORDER — CARVEDILOL 6.25 MG PO TABS
12.5000 mg | ORAL_TABLET | Freq: Two times a day (BID) | ORAL | 1 refills | Status: DC
Start: 2022-08-01 — End: 2022-09-18

## 2022-08-01 NOTE — Progress Notes (Unsigned)
     Blood Pressure Recheck Visit  Name: Daniel [name] MRN: [id number] Date of Birth: [birth year]  Daniel [name] presents today for Blood Pressure recheck with clinical support staff.  Order for BP recheck by Elsie Lincoln, RN ordered on 08/01/2022.   BP Readings from Last 3 Encounters:  08/01/22 (!) 127/91  07/13/22 110/71  05/20/22 120/80    Current Outpatient Medications  Medication Sig Dispense Refill   acetaminophen (TYLENOL) 500 MG tablet Take 1,000 mg by mouth every 6 (six) hours as needed for mild pain or moderate pain.     albuterol (VENTOLIN HFA) 108 (90 Base) MCG/ACT inhaler Inhale 1 puff into the lungs every 6 (six) hours as needed for wheezing or shortness of breath. 8.5 g 0   allopurinol (ZYLOPRIM) 300 MG tablet TAKE 1 TABLET BY MOUTH TWICE A DAY 180 tablet 1   amLODipine (NORVASC) 5 MG tablet TAKE 1 TABLET (5 MG TOTAL) BY MOUTH DAILY. 90 tablet 0   carvedilol (COREG) 6.25 MG tablet Take 1 tablet (6.25 mg total) by mouth 2 (two) times daily with a meal. 180 tablet 1   celecoxib (CELEBREX) 200 MG capsule Take 200 mg by mouth daily.     methocarbamol (ROBAXIN) 500 MG tablet Take 1 tablet (500 mg total) by mouth every 6 (six) hours as needed for muscle spasms. 30 tablet 0   ondansetron (ZOFRAN) 4 MG tablet Take 1 tablet (4 mg total) by mouth every 8 (eight) hours as needed for nausea or vomiting. 30 tablet 0   Oxycodone HCl 10 MG TABS Take 1 tablet 3 times a day by oral route as needed.     Semaglutide-Weight Management (WEGOVY) 0.25 MG/0.5ML SOAJ Inject 0.25 mg into the skin once a week. 2 mL 6   No current facility-administered medications for this visit.    Hypertensive Medication Review: Patient states that they are taking all their hypertensive medications as prescribed and their last dose of hypertensive medications was this morning   Documentation of any medication adherence discrepancies: none  Provider Recommendation:  Spoke to Dr. Waynetta Sandy  and they  stated: Notify if BP over 130/80. Patient blood pressure today is 127/91. Dr. Delford Field notified  Carvedil ( Coreg) 6.25 1 tablet (6.25 mg total) by mouth 2 (two) times daily with a meal.   Increased Carvedil ( Coreg) 12.50 mg ( 2 tablets) 2 (two) times daily with a meal.    Patient has been scheduled to follow up with 3 weeks for recheck of blood pressure with Nurse.  Patient has been given provider's recommendations and does not have any questions or concerns at this time. Patient will contact the office for any future questions or concerns.

## 2022-08-02 ENCOUNTER — Other Ambulatory Visit: Payer: Self-pay

## 2022-08-02 ENCOUNTER — Telehealth: Payer: Self-pay | Admitting: Family Medicine

## 2022-08-02 ENCOUNTER — Other Ambulatory Visit (HOSPITAL_COMMUNITY): Payer: Self-pay

## 2022-08-02 NOTE — Telephone Encounter (Signed)
Pt;s wife called in about Semaglutide-Weight Management (WEGOVY) 0.25 MG/0.5ML SOAJ , being denied, not sure if this needs PA. Please cb to discuss

## 2022-08-03 ENCOUNTER — Telehealth: Payer: Self-pay | Admitting: Family Medicine

## 2022-08-03 ENCOUNTER — Encounter: Payer: Self-pay | Admitting: Pharmacist

## 2022-08-03 NOTE — Telephone Encounter (Signed)
Do you mind conveying this information to the patient?  We will discuss alternative options at his upcoming visit with me.Thank you

## 2022-08-03 NOTE — Telephone Encounter (Signed)
Copied from CRM [id number]. Topic: General - Other >> Aug 03, 2022 12:29 PM Santiya F wrote: Reason for CRM: Pt's wife is calling in requesting to speak with [facility]. Please follow up.

## 2022-08-04 ENCOUNTER — Other Ambulatory Visit: Payer: Self-pay

## 2022-08-07 ENCOUNTER — Telehealth: Payer: Self-pay | Admitting: Family Medicine

## 2022-08-07 ENCOUNTER — Other Ambulatory Visit: Payer: Self-pay

## 2022-08-07 NOTE — Telephone Encounter (Signed)
Copied from CRM [id number]. Topic: General - Other >> Aug 07, 2022 12:47 PM Ja-Kwan M wrote: Reason for CRM:  Pt's wife called once again requesting to speak with Franky Macho regarding an alternate medication since pt insurance will not cover [phone]. Cb# [phone] or [phone]

## 2022-08-24 ENCOUNTER — Ambulatory Visit: Payer: Medicaid Other | Admitting: Family Medicine

## 2022-09-18 ENCOUNTER — Ambulatory Visit: Payer: 59 | Attending: Family Medicine | Admitting: Family Medicine

## 2022-09-18 ENCOUNTER — Encounter: Payer: Self-pay | Admitting: Family Medicine

## 2022-09-18 DIAGNOSIS — M25552 Pain in left hip: Secondary | ICD-10-CM

## 2022-09-18 DIAGNOSIS — R7303 Prediabetes: Secondary | ICD-10-CM | POA: Diagnosis not present

## 2022-09-18 DIAGNOSIS — Z1211 Encounter for screening for malignant neoplasm of colon: Secondary | ICD-10-CM

## 2022-09-18 DIAGNOSIS — M25551 Pain in right hip: Secondary | ICD-10-CM | POA: Diagnosis not present

## 2022-09-18 DIAGNOSIS — I10 Essential (primary) hypertension: Secondary | ICD-10-CM

## 2022-09-18 MED ORDER — AMLODIPINE BESYLATE 5 MG PO TABS
5.0000 mg | ORAL_TABLET | Freq: Every day | ORAL | 1 refills | Status: DC
Start: 2022-09-18 — End: 2023-03-05

## 2022-09-18 MED ORDER — CARVEDILOL 6.25 MG PO TABS
6.2500 mg | ORAL_TABLET | Freq: Two times a day (BID) | ORAL | 1 refills | Status: DC
Start: 2022-09-18 — End: 2023-02-07

## 2022-09-18 MED ORDER — PHENTERMINE-TOPIRAMATE ER 3.75-23 MG PO CP24: ORAL_CAPSULE | ORAL | 3 refills | Status: DC

## 2022-09-18 MED ORDER — AMLODIPINE BESYLATE 5 MG PO TABS: 5.0000 mg | ORAL_TABLET | Freq: Every day | ORAL | 1 refills | Status: DC

## 2022-09-18 MED ORDER — PHENTERMINE-TOPIRAMATE ER 3.75-23 MG PO CP24: ORAL_CAPSULE | ORAL | 3 refills | Status: AC

## 2022-09-18 MED ORDER — CARVEDILOL 6.25 MG PO TABS
6.2500 mg | ORAL_TABLET | Freq: Two times a day (BID) | ORAL | 1 refills | Status: DC
Start: 2022-09-18 — End: 2022-09-18

## 2022-09-18 NOTE — Progress Notes (Signed)
Subjective:  Patient ID: Daniel [name], male    DOB: 19-Sep-1970  Age: 52 y.o. MRN: [id number]  CC: Medical Management of Chronic Issues (Questions about medication wegovy had a change in insurance )   HPI Daniel [name] is a 52 y.o. year old male with a history of Previous alcohol abuse, hypertension, pulmonary embolism (in 02/2018; completed course of anticoagulation) prediabetes (A1c 5.9), bilateral hip osteoarthritis, status post left THA (in 11/2021) s/p right THA (in 03/2022), nicotine dependence (smokes 1 pack of cigarettes over the 1 week, less than 20-pack-year)   Interval History: Discussed the use of AI scribe software for clinical note transcription with the patient, who gave verbal consent to proceed.  He presents with concerns about his weight management. His insurance has stopped covering his weight loss medication, Wegovy, which he has not taken for a month. He reports that he feels he eats more while on the medication. He expresses willingness to try managing his weight on his own but agrees to try a replacement medication.  The patient also discusses his pain management for his bilateral hip replacements. He reports persistent pain and discomfort, especially after working for four to five hours. He mentions that he has not undergone physical therapy but has been advised to walk more. However, he expresses concerns about the safety of his neighborhood for walking. He receives oxycodone for pain management from Dr. Ethelene Hal  Regarding his hypertension management, the patient reports dizziness when taking a higher dose of Coreg (carvedilol 12.5 mg) as previously prescribed by Dr. Delford Field. He has since reduced the dose to one tablet in the morning and one in the evening. He also mentions that he is currently out of amlodipine and would like refills. The patient admits to ongoing smoking but reports a reduction in frequency. He declines the offer for nicotine patches.        Past  Medical History:  Diagnosis Date   Anxiety    Arthritis    Asthma    Dyspnea    Gout    Hypertension    Pre-diabetes     Past Surgical History:  Procedure Laterality Date   TOTAL HIP ARTHROPLASTY Left 12/01/2021   Procedure: TOTAL HIP ARTHROPLASTY ANTERIOR APPROACH;  Surgeon: Samson Frederic, MD;  Location: [facility];  Service: Orthopedics;  Laterality: Left;   TOTAL HIP ARTHROPLASTY Right 03/29/2022   Procedure: TOTAL HIP ARTHROPLASTY ANTERIOR APPROACH;  Surgeon: Samson Frederic, MD;  Location: [facility];  Service: Orthopedics;  Laterality: Right;  120   WISDOM TOOTH EXTRACTION     age 52    Family History  Problem Relation Age of Onset   Heart attack Mother     Social History   Socioeconomic History   Marital status: Divorced    Spouse name: Not on file   Number of children: Not on file   Years of education: Not on file   Highest education level: Not on file  Occupational History   Not on file  Tobacco Use   Smoking status: Every Day    Current packs/day: 0.30    Average packs/day: 0.3 packs/day for 33.0 years (9.9 ttl pk-yrs)    Types: Cigarettes   Smokeless tobacco: Never  Vaping Use   Vaping status: Never Used  Substance and Sexual Activity   Alcohol use: Never   Drug use: Never   Sexual activity: Yes    Birth control/protection: None    Comment: Married  Other Topics Concern   Not on file  Social History Narrative   ** Merged History Encounter **       Social Determinants of Health   Financial Resource Strain: Not on file  Food Insecurity: Food Insecurity Present (12/01/2021)   Hunger Vital Sign    Worried About Running Out of Food in the Last Year: Often true    Ran Out of Food in the Last Year: Never true  Transportation Needs: No Transportation Needs (12/01/2021)   PRAPARE - Administrator, Civil Service (Medical): No    Lack of Transportation (Non-Medical): No  Physical Activity: Not on file  Stress: Not on file  Social Connections:  Unknown (06/07/2021)   [facility]   Social Network    Social Network: Not on file    No Known Allergies  Outpatient Medications Prior to Visit  Medication Sig Dispense Refill   acetaminophen (TYLENOL) 500 MG tablet Take 1,000 mg by mouth every 6 (six) hours as needed for mild pain or moderate pain.     albuterol (VENTOLIN HFA) 108 (90 Base) MCG/ACT inhaler Inhale 1 puff into the lungs every 6 (six) hours as needed for wheezing or shortness of breath. 8.5 g 0   allopurinol (ZYLOPRIM) 300 MG tablet TAKE 1 TABLET BY MOUTH TWICE A DAY 180 tablet 1   celecoxib (CELEBREX) 200 MG capsule Take 200 mg by mouth daily.     amLODipine (NORVASC) 5 MG tablet TAKE 1 TABLET (5 MG TOTAL) BY MOUTH DAILY. 90 tablet 0   carvedilol (COREG) 6.25 MG tablet Take 2 tablets (12.5 mg total) by mouth 2 (two) times daily with a meal. 62 tablet 1   Semaglutide-Weight Management (WEGOVY) 0.25 MG/0.5ML SOAJ Inject 0.25 mg into the skin once a week. 2 mL 6   methocarbamol (ROBAXIN) 500 MG tablet Take 1 tablet (500 mg total) by mouth every 6 (six) hours as needed for muscle spasms. (Patient not taking: Reported on 09/18/2022) 30 tablet 0   ondansetron (ZOFRAN) 4 MG tablet Take 1 tablet (4 mg total) by mouth every 8 (eight) hours as needed for nausea or vomiting. (Patient not taking: Reported on 09/18/2022) 30 tablet 0   Oxycodone HCl 10 MG TABS Take 1 tablet 3 times a day by oral route as needed. (Patient not taking: Reported on 09/18/2022)     No facility-administered medications prior to visit.     ROS Review of Systems  Constitutional:  Negative for activity change and appetite change.  HENT:  Negative for sinus pressure and sore throat.   Respiratory:  Negative for chest tightness, shortness of breath and wheezing.   Cardiovascular:  Negative for chest pain and palpitations.  Gastrointestinal:  Negative for abdominal distention, abdominal pain and constipation.  Genitourinary: Negative.    Musculoskeletal: Negative.        See HPI  Psychiatric/Behavioral:  Negative for behavioral problems and dysphoric mood.     Objective:  BP 129/83   Pulse 76   Ht 5\' 10"  (1.778 m)   Wt 270 lb (122.5 kg)   SpO2 99%   BMI 38.74 kg/m      09/18/2022   10:11 AM 09/18/2022    9:24 AM 08/01/2022    9:14 AM  BP/Weight  Systolic BP 129 142 127  Diastolic BP 83 88 91  Wt. (Lbs)  270   BMI  38.74 kg/m2       Physical Exam Constitutional:      Appearance: He is well-developed.  Cardiovascular:  Rate and Rhythm: Normal rate.     Heart sounds: Normal heart sounds. No murmur heard. Pulmonary:     Effort: Pulmonary effort is normal.     Breath sounds: Normal breath sounds. No wheezing or rales.  Chest:     Chest wall: No tenderness.  Abdominal:     General: Bowel sounds are normal. There is no distension.     Palpations: Abdomen is soft. There is no mass.     Tenderness: There is no abdominal tenderness.  Musculoskeletal:        General: Normal range of motion.     Right lower leg: No edema.     Left lower leg: No edema.     Comments: Tenderness on ROM of both hips  Neurological:     Mental Status: He is alert and oriented to person, place, and time.  Psychiatric:        Mood and Affect: Mood normal.        Latest Ref Rng & Units 07/13/2022   10:30 AM 05/19/2022   10:56 PM 05/19/2022    5:57 PM  CMP  Glucose 70 - 99 mg/dL 096  045  409   BUN 6 - 24 mg/dL 8  10  11    Creatinine 0.76 - 1.27 mg/dL 8.11  9.14  7.82   Sodium 134 - 144 mmol/L 139  133  132   Potassium 3.5 - 5.2 mmol/L 4.1  3.2  3.1   Chloride 96 - 106 mmol/L 100  95  97   CO2 20 - 29 mmol/L 26  28  23    Calcium 8.7 - 10.2 mg/dL 9.8  9.2  9.1   Total Protein 6.5 - 8.1 g/dL  7.6  7.7   Total Bilirubin 0.3 - 1.2 mg/dL  0.7  1.2   Alkaline Phos 38 - 126 U/L  67  70   AST 15 - 41 U/L  16  15   ALT 0 - 44 U/L  9  9     Lipid Panel     Component Value Date/Time   CHOL 160 01/09/2022 0927   TRIG 120  01/09/2022 0927   HDL 37 (L) 01/09/2022 0927   CHOLHDL 3.8 08/11/2020 1056   LDLCALC 101 (H) 01/09/2022 0927    CBC    Component Value Date/Time   WBC 17.3 (H) 05/19/2022 2256   RBC 4.22 05/19/2022 2256   HGB 12.9 (L) 05/19/2022 2256   HGB 15.0 05/28/2019 1622   HCT 37.7 (L) 05/19/2022 2256   HCT 43.2 05/28/2019 1622   PLT 288 05/19/2022 2256   PLT 306 05/28/2019 1622   MCV 89.3 05/19/2022 2256   MCV 96 05/28/2019 1622   MCH 30.6 05/19/2022 2256   MCHC 34.2 05/19/2022 2256   RDW 15.6 (H) 05/19/2022 2256   RDW 15.5 (H) 05/28/2019 1622   LYMPHSABS 1.8 05/28/2019 1622   MONOABS 0.9 11/24/2018 1242   EOSABS 0.1 05/28/2019 1622   BASOSABS 0.1 05/28/2019 1622    Lab Results  Component Value Date   HGBA1C 5.7 (H) 03/17/2022    Assessment & Plan:      Prediabetes and Obesity Insurance not covering [place]. Patient reported increased appetite with NFAOZH. Patient willing to try weight loss on his own but also open to alternative medication. -Discontinue Wegovy due to insurance coverage issues. -Prescribe Qsymia, starting at the lowest dose for 14 days then increase to 7.5mg /46mg  daily. -Inform patient to notify Dr. Ethelene Hal about new medication due  to its controlled substance status. -Encourage patient to continue efforts in diet and exercise for weight loss.  Hypertension Blood pressure slightly elevated at 142/80 initially, but repeat measurement showed 129/83. Patient reported dizziness with higher dose of carvedilol; will decrease by from 12.5 mg twice daily to 6.25 mg twice daily. -Continue current regimen of amlodipine and carvedilol 6.25mg  twice daily.  Hip Pain Post Bilateral Hip Replacement Patient reported ongoing pain and discomfort, particularly after work. No physical therapy initiated post-surgery. -Encourage patient to engage in walking as tolerated for physical therapy. Patient receiving oxycodone from Dr. Ethelene Hal for chronic pain. -No changes recommended. Patient  to inform Dr. Ethelene Hal about new medication (Qsymia) due to its controlled substance status.   Tobacco Use Patient continues to smoke but reports reduction in frequency. -Encourage continued efforts to quit smoking. Patient declined nicotine patches.  Colon Cancer Screening Patient due for colonoscopy. -Place referral for colonoscopy.          Meds ordered this encounter  Medications   DISCONTD: Phentermine-Topiramate 3.75-23 MG CP24    Sig: Take 1 capsule by mouth daily for 14 days, THEN 2 capsules daily.    Dispense:  74 capsule    Refill:  3   DISCONTD: amLODipine (NORVASC) 5 MG tablet    Sig: Take 1 tablet (5 mg total) by mouth daily.    Dispense:  90 tablet    Refill:  1   DISCONTD: carvedilol (COREG) 6.25 MG tablet    Sig: Take 1 tablet (6.25 mg total) by mouth 2 (two) times daily with a meal.    Dispense:  180 tablet    Refill:  1    Dose decrease   amLODipine (NORVASC) 5 MG tablet    Sig: Take 1 tablet (5 mg total) by mouth daily.    Dispense:  90 tablet    Refill:  1   carvedilol (COREG) 6.25 MG tablet    Sig: Take 1 tablet (6.25 mg total) by mouth 2 (two) times daily with a meal.    Dispense:  180 tablet    Refill:  1    Dose decrease   DISCONTD: Phentermine-Topiramate 3.75-23 MG CP24    Sig: Take 1 capsule by mouth daily for 14 days, THEN 2 capsules daily.    Dispense:  74 capsule    Refill:  3   Phentermine-Topiramate 3.75-23 MG CP24    Sig: Take 1 capsule by mouth daily for 14 days, THEN 2 capsules daily.    Dispense:  74 capsule    Refill:  3    Follow-up: Return in about 6 months (around 03/21/2023).       Hoy Register, MD, FAAFP. [facility] [place], [place] [phone]   09/18/2022, 11:11 AM

## 2022-09-18 NOTE — Patient Instructions (Signed)
Calorie Counting for Weight Loss Calories are units of energy. Your body needs a certain number of calories from food to keep going throughout the day. When you eat or drink more calories than your body needs, your body stores the extra calories mostly as fat. When you eat or drink fewer calories than your body needs, your body burns fat to get the energy it needs. Calorie counting means keeping track of how many calories you eat and drink each day. Calorie counting can be helpful if you need to lose weight. If you eat fewer calories than your body needs, you should lose weight. Ask your health care provider what a healthy weight is for you. For calorie counting to work, you will need to eat the right number of calories each day to lose a healthy amount of weight per week. A dietitian can help you figure out how many calories you need in a day and will suggest ways to reach your calorie goal. A healthy amount of weight to lose each week is usually 1-2 lb (0.5-0.9 kg). This usually means that your daily calorie intake should be reduced by 500-750 calories. Eating 1,200-1,500 calories a day can help most women lose weight. Eating 1,500-1,800 calories a day can help most men lose weight. What do I need to know about calorie counting? Work with your health care provider or dietitian to determine how many calories you should get each day. To meet your daily calorie goal, you will need to: Find out how many calories are in each food that you would like to eat. Try to do this before you eat. Decide how much of the food you plan to eat. Keep a food log. Do this by writing down what you ate and how many calories it had. To successfully lose weight, it is important to balance calorie counting with a healthy lifestyle that includes regular activity. Where do I find calorie information?  The number of calories in a food can be found on a Nutrition Facts label. If a food does not have a Nutrition Facts label, try  to look up the calories online or ask your dietitian for help. Remember that calories are listed per serving. If you choose to have more than one serving of a food, you will have to multiply the calories per serving by the number of servings you plan to eat. For example, the label on a package of bread might say that a serving size is 1 slice and that there are 90 calories in a serving. If you eat 1 slice, you will have eaten 90 calories. If you eat 2 slices, you will have eaten 180 calories. How do I keep a food log? After each time that you eat, record the following in your food log as soon as possible: What you ate. Be sure to include toppings, sauces, and other extras on the food. How much you ate. This can be measured in cups, ounces, or number of items. How many calories were in each food and drink. The total number of calories in the food you ate. Keep your food log near you, such as in a pocket-sized notebook or on an app or website on your mobile phone. Some programs will calculate calories for you and show you how many calories you have left to meet your daily goal. What are some portion-control tips? Know how many calories are in a serving. This will help you know how many servings you can have of a certain   food. Use a measuring cup to measure serving sizes. You could also try weighing out portions on a kitchen scale. With time, you will be able to estimate serving sizes for some foods. Take time to put servings of different foods on your favorite plates or in your favorite bowls and cups so you know what a serving looks like. Try not to eat straight from a food's packaging, such as from a bag or box. Eating straight from the package makes it hard to see how much you are eating and can lead to overeating. Put the amount you would like to eat in a cup or on a plate to make sure you are eating the right portion. Use smaller plates, glasses, and bowls for smaller portions and to prevent  overeating. Try not to multitask. For example, avoid watching TV or using your computer while eating. If it is time to eat, sit down at a table and enjoy your food. This will help you recognize when you are full. It will also help you be more mindful of what and how much you are eating. What are tips for following this plan? Reading food labels Check the calorie count compared with the serving size. The serving size may be smaller than what you are used to eating. Check the source of the calories. Try to choose foods that are high in protein, fiber, and vitamins, and low in saturated fat, trans fat, and sodium. Shopping Read nutrition labels while you shop. This will help you make healthy decisions about which foods to buy. Pay attention to nutrition labels for low-fat or fat-free foods. These foods sometimes have the same number of calories or more calories than the full-fat versions. They also often have added sugar, starch, or salt to make up for flavor that was removed with the fat. Make a grocery list of lower-calorie foods and stick to it. Cooking Try to cook your favorite foods in a healthier way. For example, try baking instead of frying. Use low-fat dairy products. Meal planning Use more fruits and vegetables. One-half of your plate should be fruits and vegetables. Include lean proteins, such as chicken, turkey, and fish. Lifestyle Each week, aim to do one of the following: 150 minutes of moderate exercise, such as walking. 75 minutes of vigorous exercise, such as running. General information Know how many calories are in the foods you eat most often. This will help you calculate calorie counts faster. Find a way of tracking calories that works for you. Get creative. Try different apps or programs if writing down calories does not work for you. What foods should I eat?  Eat nutritious foods. It is better to have a nutritious, high-calorie food, such as an avocado, than a food with  few nutrients, such as a bag of potato chips. Use your calories on foods and drinks that will fill you up and will not leave you hungry soon after eating. Examples of foods that fill you up are nuts and nut butters, vegetables, lean proteins, and high-fiber foods such as whole grains. High-fiber foods are foods with more than 5 g of fiber per serving. Pay attention to calories in drinks. Low-calorie drinks include water and unsweetened drinks. The items listed above may not be a complete list of foods and beverages you can eat. Contact a dietitian for more information. What foods should I limit? Limit foods or drinks that are not good sources of vitamins, minerals, or protein or that are high in unhealthy fats. These   include: Candy. Other sweets. Sodas, specialty coffee drinks, alcohol, and juice. The items listed above may not be a complete list of foods and beverages you should avoid. Contact a dietitian for more information. How do I count calories when eating out? Pay attention to portions. Often, portions are much larger when eating out. Try these tips to keep portions smaller: Consider sharing a meal instead of getting your own. If you get your own meal, eat only half of it. Before you start eating, ask for a container and put half of your meal into it. When available, consider ordering smaller portions from the menu instead of full portions. Pay attention to your food and drink choices. Knowing the way food is cooked and what is included with the meal can help you eat fewer calories. If calories are listed on the menu, choose the lower-calorie options. Choose dishes that include vegetables, fruits, whole grains, low-fat dairy products, and lean proteins. Choose items that are boiled, broiled, grilled, or steamed. Avoid items that are buttered, battered, fried, or served with cream sauce. Items labeled as crispy are usually fried, unless stated otherwise. Choose water, low-fat milk,  unsweetened iced tea, or other drinks without added sugar. If you want an alcoholic beverage, choose a lower-calorie option, such as a glass of wine or light beer. Ask for dressings, sauces, and syrups on the side. These are usually high in calories, so you should limit the amount you eat. If you want a salad, choose a garden salad and ask for grilled meats. Avoid extra toppings such as bacon, cheese, or fried items. Ask for the dressing on the side, or ask for olive oil and vinegar or lemon to use as dressing. Estimate how many servings of a food you are given. Knowing serving sizes will help you be aware of how much food you are eating at restaurants. Where to find more information Centers for Disease Control and Prevention: www.cdc.gov U.[place] Department of Agriculture: myplate.gov Summary Calorie counting means keeping track of how many calories you eat and drink each day. If you eat fewer calories than your body needs, you should lose weight. A healthy amount of weight to lose per week is usually 1-2 lb (0.5-0.9 kg). This usually means reducing your daily calorie intake by 500-750 calories. The number of calories in a food can be found on a Nutrition Facts label. If a food does not have a Nutrition Facts label, try to look up the calories online or ask your dietitian for help. Use smaller plates, glasses, and bowls for smaller portions and to prevent overeating. Use your calories on foods and drinks that will fill you up and not leave you hungry shortly after a meal. This information is not intended to replace advice given to you by your health care provider. Make sure you discuss any questions you have with your health care provider. Document Revised: 02/20/2019 Document Reviewed: 02/20/2019 Elsevier Patient Education  2023 Elsevier Inc.  

## 2022-09-19 ENCOUNTER — Other Ambulatory Visit: Payer: Self-pay

## 2022-09-20 ENCOUNTER — Other Ambulatory Visit: Payer: Self-pay | Admitting: Family Medicine

## 2022-09-20 DIAGNOSIS — M1A00X0 Idiopathic chronic gout, unspecified site, without tophus (tophi): Secondary | ICD-10-CM

## 2022-09-22 ENCOUNTER — Other Ambulatory Visit: Payer: Self-pay

## 2022-11-06 ENCOUNTER — Ambulatory Visit: Payer: 59 | Attending: Family Medicine | Admitting: Family Medicine

## 2022-11-06 ENCOUNTER — Encounter: Payer: Self-pay | Admitting: Family Medicine

## 2022-11-06 VITALS — BP 124/76 | HR 86 | Ht 70.0 in | Wt 272.4 lb

## 2022-11-06 DIAGNOSIS — Z23 Encounter for immunization: Secondary | ICD-10-CM

## 2022-11-06 DIAGNOSIS — L308 Other specified dermatitis: Secondary | ICD-10-CM | POA: Diagnosis not present

## 2022-11-06 MED ORDER — ALBUTEROL SULFATE HFA 108 (90 BASE) MCG/ACT IN AERS
1.0000 | INHALATION_SPRAY | Freq: Four times a day (QID) | RESPIRATORY_TRACT | 0 refills | Status: DC | PRN
  Filled 2022-11-08: qty 18, 30d supply, fill #0
  Filled 2022-11-08: qty 18, 28d supply, fill #0

## 2022-11-06 MED ORDER — TRIAMCINOLONE ACETONIDE 0.1 % EX CREA
1.0000 | TOPICAL_CREAM | Freq: Two times a day (BID) | CUTANEOUS | 1 refills | Status: AC
  Filled 2022-11-08: qty 80, 28d supply, fill #0
  Filled 2023-05-02: qty 80, 28d supply, fill #1

## 2022-11-06 NOTE — Progress Notes (Signed)
Subjective:  Patient ID: Daniel [name], male    DOB: 1970-05-18  Age: 52 y.o. MRN: [id number]  CC: Medical Management of Chronic Issues   HPI Daniel [name] is a 52 y.o. year old male with a history of Previous alcohol abuse, hypertension, pulmonary embolism (in 02/2018; completed course of anticoagulation) prediabetes (A1c 5.9), bilateral hip osteoarthritis, status post left THA (in 11/2021) s/p right THA (in 03/2022), nicotine dependence (smokes 1 pack of cigarettes over the 1 week, less than 20-pack-year)   Interval History: Discussed the use of AI scribe software for clinical note transcription with the patient, who gave verbal consent to proceed.  He presents with weight gain and skin itching. He reports that his insurance company has stopped covering his weight loss medications, Ozempic and Qsymia, leading to weight regain. He expresses frustration with the insurance system and the challenges it presents in managing his health.  In addition to weight gain, the patient reports seasonal skin itching, particularly on his back and under his legs, which worsens in the fall and with cold weather. He describes the itching as intense, leading to scratching that initially provides relief but eventually causes pain. He has noticed a rash in the affected areas.  He has a history of hip surgery, and while he experiences morning pain, it improves with movement. He is able to walk and remain active.        Past Medical History:  Diagnosis Date   Anxiety    Arthritis    Asthma    Dyspnea    Gout    Hypertension    Pre-diabetes     Past Surgical History:  Procedure Laterality Date   TOTAL HIP ARTHROPLASTY Left 12/01/2021   Procedure: TOTAL HIP ARTHROPLASTY ANTERIOR APPROACH;  Surgeon: Samson Frederic, MD;  Location: [facility];  Service: Orthopedics;  Laterality: Left;   TOTAL HIP ARTHROPLASTY Right 03/29/2022   Procedure: TOTAL HIP ARTHROPLASTY ANTERIOR APPROACH;  Surgeon: Samson Frederic,  MD;  Location: [facility];  Service: Orthopedics;  Laterality: Right;  120   WISDOM TOOTH EXTRACTION     age 52    Family History  Problem Relation Age of Onset   Heart attack Mother     Social History   Socioeconomic History   Marital status: Divorced    Spouse name: Not on file   Number of children: Not on file   Years of education: Not on file   Highest education level: Not on file  Occupational History   Not on file  Tobacco Use   Smoking status: Every Day    Current packs/day: 0.30    Average packs/day: 0.3 packs/day for 33.0 years (9.9 ttl pk-yrs)    Types: Cigarettes   Smokeless tobacco: Never  Vaping Use   Vaping status: Never Used  Substance and Sexual Activity   Alcohol use: Never   Drug use: Never   Sexual activity: Yes    Birth control/protection: None    Comment: Married  Other Topics Concern   Not on file  Social History Narrative   ** Merged History Encounter **       Social Determinants of Health   Financial Resource Strain: Not on file  Food Insecurity: Food Insecurity Present (12/01/2021)   Hunger Vital Sign    Worried About Running Out of Food in the Last Year: Often true    Ran Out of Food in the Last Year: Never true  Transportation Needs: No Transportation Needs (12/01/2021)   PRAPARE -  Administrator, Civil Service (Medical): No    Lack of Transportation (Non-Medical): No  Physical Activity: Not on file  Stress: Not on file  Social Connections: Unknown (06/07/2021)   [facility]   Social Network    Social Network: Not on file    No Known Allergies  Outpatient Medications Prior to Visit  Medication Sig Dispense Refill   acetaminophen (TYLENOL) 500 MG tablet Take 1,000 mg by mouth every 6 (six) hours as needed for mild pain or moderate pain.     allopurinol (ZYLOPRIM) 300 MG tablet TAKE 1 TABLET BY MOUTH TWICE A DAY 180 tablet 1   amLODipine (NORVASC) 5 MG tablet Take 1 tablet (5 mg total) by  mouth daily. 90 tablet 1   carvedilol (COREG) 6.25 MG tablet Take 1 tablet (6.25 mg total) by mouth 2 (two) times daily with a meal. 180 tablet 1   celecoxib (CELEBREX) 200 MG capsule Take 200 mg by mouth daily.     methocarbamol (ROBAXIN) 500 MG tablet Take 1 tablet (500 mg total) by mouth every 6 (six) hours as needed for muscle spasms. (Patient not taking: Reported on 09/18/2022) 30 tablet 0   ondansetron (ZOFRAN) 4 MG tablet Take 1 tablet (4 mg total) by mouth every 8 (eight) hours as needed for nausea or vomiting. (Patient not taking: Reported on 09/18/2022) 30 tablet 0   Oxycodone HCl 10 MG TABS Take 1 tablet 3 times a day by oral route as needed. (Patient not taking: Reported on 09/18/2022)     Phentermine-Topiramate 3.75-23 MG CP24 Take 1 capsule by mouth daily for 14 days, THEN 2 capsules daily. 74 capsule 3   albuterol (VENTOLIN HFA) 108 (90 Base) MCG/ACT inhaler Inhale 1 puff into the lungs every 6 (six) hours as needed for wheezing or shortness of breath. 8.5 g 0   No facility-administered medications prior to visit.     ROS Review of Systems  Constitutional:  Negative for activity change and appetite change.  HENT:  Negative for sinus pressure and sore throat.   Respiratory:  Negative for chest tightness, shortness of breath and wheezing.   Cardiovascular:  Negative for chest pain and palpitations.  Gastrointestinal:  Negative for abdominal distention, abdominal pain and constipation.  Genitourinary: Negative.   Musculoskeletal: Negative.   Skin:        See HPI  Psychiatric/Behavioral:  Negative for behavioral problems and dysphoric mood.     Objective:  BP 124/76   Pulse 86   Ht 5\' 10"  (1.778 m)   Wt 272 lb 6.4 oz (123.6 kg)   SpO2 98%   BMI 39.09 kg/m      11/06/2022    4:15 PM 09/18/2022   10:11 AM 09/18/2022    9:24 AM  BP/Weight  Systolic BP 124 129 142  Diastolic BP 76 83 88  Wt. (Lbs) 272.4  270  BMI 39.09 kg/m2  38.74 kg/m2      Physical  Exam Constitutional:      Appearance: He is well-developed.  Cardiovascular:     Rate and Rhythm: Normal rate.     Heart sounds: Normal heart sounds. No murmur heard. Pulmonary:     Effort: Pulmonary effort is normal.     Breath sounds: Normal breath sounds. No wheezing or rales.  Chest:     Chest wall: No tenderness.  Abdominal:     General: Bowel sounds are normal. There is no distension.     Palpations:  Abdomen is soft. There is no mass.     Tenderness: There is no abdominal tenderness.  Musculoskeletal:        General: Normal range of motion.     Right lower leg: No edema.     Left lower leg: No edema.  Skin:    Comments: Hyperpigmented scaly rash on posterior trunk  Neurological:     Mental Status: He is alert and oriented to person, place, and time.  Psychiatric:        Mood and Affect: Mood normal.        Latest Ref Rng & Units 07/13/2022   10:30 AM 05/19/2022   10:56 PM 05/19/2022    5:57 PM  CMP  Glucose 70 - 99 mg/dL 161  096  045   BUN 6 - 24 mg/dL 8  10  11    Creatinine 0.76 - 1.27 mg/dL 4.09  8.11  9.14   Sodium 134 - 144 mmol/L 139  133  132   Potassium 3.5 - 5.2 mmol/L 4.1  3.2  3.1   Chloride 96 - 106 mmol/L 100  95  97   CO2 20 - 29 mmol/L 26  28  23    Calcium 8.7 - 10.2 mg/dL 9.8  9.2  9.1   Total Protein 6.5 - 8.1 g/dL  7.6  7.7   Total Bilirubin 0.3 - 1.2 mg/dL  0.7  1.2   Alkaline Phos 38 - 126 U/L  67  70   AST 15 - 41 U/L  16  15   ALT 0 - 44 U/L  9  9     Lipid Panel     Component Value Date/Time   CHOL 160 01/09/2022 0927   TRIG 120 01/09/2022 0927   HDL 37 (L) 01/09/2022 0927   CHOLHDL 3.8 08/11/2020 1056   LDLCALC 101 (H) 01/09/2022 0927    CBC    Component Value Date/Time   WBC 17.3 (H) 05/19/2022 2256   RBC 4.22 05/19/2022 2256   HGB 12.9 (L) 05/19/2022 2256   HGB 15.0 05/28/2019 1622   HCT 37.7 (L) 05/19/2022 2256   HCT 43.2 05/28/2019 1622   PLT 288 05/19/2022 2256   PLT 306 05/28/2019 1622   MCV 89.3 05/19/2022 2256    MCV 96 05/28/2019 1622   MCH 30.6 05/19/2022 2256   MCHC 34.2 05/19/2022 2256   RDW 15.6 (H) 05/19/2022 2256   RDW 15.5 (H) 05/28/2019 1622   LYMPHSABS 1.8 05/28/2019 1622   MONOABS 0.9 11/24/2018 1242   EOSABS 0.1 05/28/2019 1622   BASOSABS 0.1 05/28/2019 1622    Lab Results  Component Value Date   HGBA1C 5.7 (H) 03/17/2022    Assessment & Plan:      Obesity Insurance denied coverage for weight loss medications (Ozempic and Qsymia). Patient reports weight gain. -Encouraged diet and exercise.  Hypertension Blood pressure well controlled on current regimen. -Continue Amlodipine and Carvedilol.   Eczema New onset of itching and rash in the fall, likely eczema. -Advise warm (not hot) showers, quick showers, and immediate application of lotion post-shower. -Prescribe Triamcinolone cream for rash.  General Health Maintenance -Administer influenza vaccine today. -Follow-up appointment in February.          Meds ordered this encounter  Medications   albuterol (VENTOLIN HFA) 108 (90 Base) MCG/ACT inhaler    Sig: Inhale 1 puff into the lungs every 6 (six) hours as needed for wheezing or shortness of breath.    Dispense:  8.5  g    Refill:  0   triamcinolone cream (KENALOG) 0.1 %    Sig: Apply 1 Application topically 2 (two) times daily.    Dispense:  80 g    Refill:  1    Follow-up: Return for previously scheduled appointment.       Hoy Register, MD, FAAFP. [facility] [place], [place] [phone]   11/06/2022, 4:50 PM

## 2022-11-06 NOTE — Patient Instructions (Addendum)
Please call to schedule your colonoscopy. Placed in [facility] [address] [place], [place] [address] PH# [phone]    Eczema Eczema refers to a group of skin conditions that cause skin to become rough and inflamed. Each type of eczema has different triggers, symptoms, and treatments. Eczema of any type is usually itchy. Symptoms range from mild to severe. Eczema is not spread from person to person (is not contagious). It can appear on different parts of the body at different times. One person's eczema may look different from another person's eczema. What are the causes? The exact cause of this condition is not known. However, exposure to certain environmental factors, irritants, and allergens can make the condition worse. What are the signs or symptoms? Symptoms of this condition depend on the type of eczema you have. The types include: Contact dermatitis. There are two kinds: Irritant contact dermatitis. This happens when something irritates the skin and causes a rash. Allergic contact dermatitis. This happens when your skin comes in contact with something you are allergic to (allergens). This can include poison ivy, chemicals, or medicines that were applied to your skin. Atopic dermatitis. This is a long-term (chronic) skin disease that keeps coming back (recurring). It is the most common type of eczema. Usual symptoms are a red rash and itchy, dry, scaly skin. It usually starts showing signs in infancy and can last through adulthood. Dyshidrotic eczema. This is a form of eczema on the hands and feet. It shows up as very itchy, fluid-filled blisters. It can affect people of any age but is more common before age 47. Hand eczema. This causes very itchy areas of skin on the palms and sides of the hands and fingers. This type of eczema is common in industrial jobs where you may be exposed to different types of irritants. Lichen simplex chronicus. This type of eczema occurs when a person constantly  scratches one area of the body. Repeated scratching of the area leads to thickened skin (lichenification). This condition can accompany other types of eczema. It is more common in adults but may also be seen in children. Nummular eczema. This is a common type of eczema that most often affects the lower legs and the backs of the hands. It typically causes an itchy, red, circular, crusty lesion (plaque). Scratching may become a habit and can cause bleeding. Nummular eczema occurs most often in middle-aged or older people. Seborrheic dermatitis. This is a common skin disease that mainly affects the scalp. It may also affect other oily areas of the body, such as the face, sides of the nose, eyebrows, ears, eyelids, and chest. It is marked by small scaling and redness of the skin (erythema). This can affect people of all ages. In infants, this condition is called cradle cap. Stasis dermatitis. This is a common skin disease that can cause itching, scaling, and hyperpigmentation, usually on the legs and feet. It occurs most often in people who have a condition that prevents blood from being pumped through the veins in the legs (chronic venous insufficiency). Stasis dermatitis is a chronic condition that needs long-term management. How is this diagnosed? This condition may be diagnosed based on: A physical exam of your skin. Your medical history. Skin patch tests. These tests involve using patches that contain possible allergens and placing them on your back. Your health care provider will check in a few days to see if an allergic reaction occurred. How is this treated? Treatment for eczema is based on the type  of eczema you have. You may be given hydrocortisone steroid medicine or antihistamines. These can relieve itching quickly and help reduce inflammation. These may be prescribed or purchased over the counter, depending on the strength that is needed. Follow these instructions at home: Take or apply  over-the-counter and prescription medicines only as told by your health care provider. Use creams or ointments to moisturize your skin. Do not use lotions. Learn what triggers or irritates your symptoms so you can avoid these things. Treat symptom flare-ups quickly. Do not scratch your skin. This can make your rash worse. Keep all follow-up visits. This is important. Where to find more information American Academy of Dermatology: MarketingSheets.si National Eczema Association: nationaleczema.org The Society for Pediatric Dermatology: pedsderm.net Contact a health care provider if: You have severe itching, even with treatment. You scratch your skin regularly until it bleeds. Your rash looks different than usual. Your skin is painful, swollen, or more red than usual. You have a fever. Summary Eczema refers to a group of skin conditions that cause skin to become rough and inflamed. Each type has different triggers. Eczema of any type causes itching that may range from mild to severe. Treatment varies based on the type of eczema you have. Hydrocortisone steroid medicine or antihistamines can help with itching and inflammation. Protecting your skin is the best way to prevent eczema. Use creams or ointments to moisturize your skin. Avoid triggers and irritants. Treat flare-ups quickly. This information is not intended to replace advice given to you by your health care provider. Make sure you discuss any questions you have with your health care provider. Document Revised: 10/17/2019 Document Reviewed: 10/20/2019 Elsevier Patient Education  2024 [employer].

## 2022-11-07 ENCOUNTER — Other Ambulatory Visit: Payer: Self-pay

## 2022-11-08 ENCOUNTER — Other Ambulatory Visit: Payer: Self-pay

## 2022-11-09 ENCOUNTER — Other Ambulatory Visit: Payer: Self-pay

## 2022-11-10 ENCOUNTER — Other Ambulatory Visit: Payer: Self-pay

## 2022-11-13 ENCOUNTER — Other Ambulatory Visit: Payer: Self-pay

## 2022-11-22 ENCOUNTER — Other Ambulatory Visit: Payer: Self-pay

## 2022-11-30 ENCOUNTER — Ambulatory Visit: Payer: 59

## 2022-11-30 VITALS — Ht 70.0 in | Wt 272.0 lb

## 2022-11-30 DIAGNOSIS — Z1211 Encounter for screening for malignant neoplasm of colon: Secondary | ICD-10-CM

## 2022-11-30 MED ORDER — NA SULFATE-K SULFATE-MG SULF 17.5-3.13-1.6 GM/177ML PO SOLN
1.0000 | Freq: Once | ORAL | 0 refills | Status: AC
Start: 2022-11-30 — End: 2022-11-30

## 2022-11-30 NOTE — Progress Notes (Signed)

## 2022-12-28 ENCOUNTER — Encounter: Payer: Self-pay | Admitting: Internal Medicine

## 2022-12-28 ENCOUNTER — Ambulatory Visit: Payer: 59 | Admitting: Internal Medicine

## 2022-12-28 VITALS — BP 126/81 | HR 80 | Temp 98.8°F | Resp 12 | Ht 70.0 in | Wt 272.0 lb

## 2022-12-28 DIAGNOSIS — K639 Disease of intestine, unspecified: Secondary | ICD-10-CM

## 2022-12-28 DIAGNOSIS — Z1211 Encounter for screening for malignant neoplasm of colon: Secondary | ICD-10-CM

## 2022-12-28 DIAGNOSIS — D120 Benign neoplasm of cecum: Secondary | ICD-10-CM

## 2022-12-28 DIAGNOSIS — D123 Benign neoplasm of transverse colon: Secondary | ICD-10-CM | POA: Diagnosis not present

## 2022-12-28 DIAGNOSIS — K635 Polyp of colon: Secondary | ICD-10-CM | POA: Diagnosis not present

## 2022-12-28 DIAGNOSIS — K5730 Diverticulosis of large intestine without perforation or abscess without bleeding: Secondary | ICD-10-CM | POA: Diagnosis not present

## 2022-12-28 DIAGNOSIS — K648 Other hemorrhoids: Secondary | ICD-10-CM | POA: Diagnosis not present

## 2022-12-28 MED ORDER — SODIUM CHLORIDE 0.9 % IV SOLN: 500.0000 mL | Freq: Once | INTRAVENOUS | Status: DC

## 2022-12-28 NOTE — Progress Notes (Signed)
GASTROENTEROLOGY PROCEDURE H&P NOTE   Primary Care Physician: Hoy Register, MD    Reason for Procedure:   Colon cancer screening  Plan:    Colonoscopy  Patient is appropriate for endoscopic procedure(s) in the ambulatory ([facility]) setting.  The nature of the procedure, as well as the risks, benefits, and alternatives were carefully and thoroughly reviewed with the patient. Ample time for discussion and questions allowed. The patient understood, was satisfied, and agreed to proceed.     HPI: Daniel [name] is a 52 y.o. male who presents for colonoscopy for colon cancer screening. Denies blood in stools, changes in bowel habits, or unintentional weight loss. Denies family history of colon cancer.  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 Authorizing Provider  acetaminophen (TYLENOL) 500 MG tablet Take 1,000 mg by mouth every 6 (six) hours as needed for mild pain (pain score 1-3) or moderate pain (pain score 4-6).   Yes [provider]  albuterol (VENTOLIN HFA) 108 (90 Base) MCG/ACT inhaler Inhale 1 puff into the lungs every 6 (six) hours as needed for wheezing or shortness of breath. 11/06/22  Yes Hoy Register, MD  allopurinol (ZYLOPRIM) 300 MG tablet TAKE 1 TABLET BY MOUTH TWICE A DAY 09/20/22  Yes Newlin, Enobong, MD  amLODipine  (NORVASC) 5 MG tablet Take 1 tablet (5 mg total) by mouth daily. 09/18/22  Yes Hoy Register, MD  carvedilol (COREG) 6.25 MG tablet Take 1 tablet (6.25 mg total) by mouth 2 (two) times daily with a meal. 09/18/22  Yes Newlin, Enobong, MD  celecoxib (CELEBREX) 200 MG capsule Take 200 mg by mouth daily. 02/07/22  Yes [provider]  methocarbamol (ROBAXIN) 500 MG tablet Take 1 tablet (500 mg total) by mouth every 6 (six) hours as needed for muscle spasms. 03/30/22  Yes HillAlain Honey, PA-C  Oxycodone HCl 10 MG TABS  05/08/22  Yes [provider]  triamcinolone cream (KENALOG) 0.1 % Apply 1 Application topically 2 (two) times daily. 11/06/22  Yes Newlin, Odette Horns, MD  Phentermine-Topiramate 3.75-23 MG CP24 Take 1 capsule by mouth daily for 14 days, THEN 2 capsules daily. Patient not taking: Reported on 11/30/2022 09/18/22 11/01/22  Hoy Register, MD  colchicine 0.6 MG tablet Take 2 tabs (1.2mg ) at the onset of a Gout flare, may repeat 1 tab (0.6mg ) after 2 hours if symptoms persist 09/30/19 04/12/20  Hoy Register, MD    Current Outpatient Medications  Medication Sig Dispense Refill   acetaminophen (TYLENOL) 500 MG tablet Take 1,000 mg by mouth every 6 (six) hours as needed for mild pain (pain score 1-3) or moderate pain (pain score 4-6).     albuterol (VENTOLIN HFA) 108 (90 Base) MCG/ACT inhaler Inhale 1 puff into the lungs every 6 (six) hours as  needed for wheezing or shortness of breath. 18 g 0   allopurinol (ZYLOPRIM) 300 MG tablet TAKE 1 TABLET BY MOUTH TWICE A DAY 180 tablet 1   amLODipine (NORVASC) 5 MG tablet Take 1 tablet (5 mg total) by mouth daily. 90 tablet 1   carvedilol (COREG) 6.25 MG tablet Take 1 tablet (6.25 mg total) by mouth 2 (two) times daily with a meal. 180 tablet 1   celecoxib (CELEBREX) 200 MG capsule Take 200 mg by mouth daily.     methocarbamol (ROBAXIN) 500 MG tablet Take 1 tablet (500 mg total) by mouth every 6 (six) hours as needed for muscle spasms. 30  tablet 0   Oxycodone HCl 10 MG TABS      triamcinolone cream (KENALOG) 0.1 % Apply 1 Application topically 2 (two) times daily. 80 g 1   Phentermine-Topiramate 3.75-23 MG CP24 Take 1 capsule by mouth daily for 14 days, THEN 2 capsules daily. (Patient not taking: Reported on 11/30/2022) 74 capsule 3   Current Facility-Administered Medications  Medication Dose Route Frequency Provider Last Rate Last Admin   0.9 %  sodium chloride infusion  500 mL Intravenous Once Imogene Burn, MD        Allergies as of 12/28/2022   (No Known Allergies)    Family History  Problem Relation Age of Onset   Heart attack Mother    Colon cancer Neg Hx    Colon polyps Neg Hx    Rectal cancer Neg Hx    Stomach cancer Neg Hx    Esophageal cancer Neg Hx     Social History   Socioeconomic History   Marital status: Divorced    Spouse name: Not on file   Number of children: Not on file   Years of education: Not on file   Highest education level: Not on file  Occupational History   Not on file  Tobacco Use   Smoking status: Every Day    Current packs/day: 0.30    Average packs/day: 0.3 packs/day for 33.0 years (9.9 ttl pk-yrs)    Types: Cigarettes   Smokeless tobacco: Never  Vaping Use   Vaping status: Never Used  Substance and Sexual Activity   Alcohol use: Never   Drug use: Never   Sexual activity: Yes    Birth control/protection: None    Comment: Married  Other Topics Concern   Not on file  Social History Narrative   ** Merged History Encounter **       Social Determinants of Health   Financial Resource Strain: Low Risk  (11/07/2022)   Overall Financial Resource Strain (CARDIA)    Difficulty of Paying Living Expenses: Not hard at all  Food Insecurity: No Food Insecurity (11/07/2022)   Hunger Vital Sign    Worried About Running Out of Food in the Last Year: Never true    Ran Out of Food in the Last Year: Never true  Transportation Needs: No Transportation Needs (11/07/2022)   PRAPARE  - Administrator, Civil Service (Medical): No    Lack of Transportation (Non-Medical): No  Physical Activity: Insufficiently Active (11/07/2022)   Exercise Vital Sign    Days of Exercise per Week: 2 days    Minutes of Exercise per Session: 60 min  Stress: No Stress Concern Present (11/07/2022)   [employer] of Occupational Health - Occupational Stress Questionnaire    Feeling of Stress : Not at all  Social Connections: Unknown (11/07/2022)   Social Connection and Isolation  Panel [NHANES]    Frequency of Communication with Friends and Family: Three times a week    Frequency of Social Gatherings with Friends and Family: Three times a week    Attends Religious Services: Patient declined    Active Member of Clubs or Organizations: No    Attends Banker Meetings: Never    Marital Status: Married  Catering manager Violence: Not At Risk (11/07/2022)   Humiliation, Afraid, Rape, and Kick questionnaire    Fear of Current or Ex-Partner: No    Emotionally Abused: No    Physically Abused: No    Sexually Abused: No    Physical Exam: Vital signs in last 24 hours: BP (!) 142/80   Pulse 90   Temp 98.8 F (37.1 C) (Temporal)   Ht 5\' 10"  (1.778 m)   Wt 272 lb (123.4 kg)   SpO2 97%   BMI 39.03 kg/m  GEN: NAD EYE: Sclerae anicteric ENT: MMM CV: Non-tachycardic Pulm: No increased work of breathing GI: Soft, NT/ND NEURO:  Alert & Oriented   Eulah Pont, MD [facility]  12/28/2022 9:02 AM

## 2022-12-28 NOTE — Progress Notes (Signed)
Pt's states no medical or surgical changes since previsit or office visit. 

## 2022-12-28 NOTE — Op Note (Signed)
[facility] Patient Name: Daniel [name] Procedure Date: 12/28/2022 9:14 AM MRN: [id number] Endoscopist: Madelyn Brunner [place] , , [id number] Age: 52 Referring MD:  Date of Birth: [birth year] Gender: Male Account #: [id number] Procedure:                Colonoscopy Indications:              Screening for colorectal malignant neoplasm, This                            is the patient's first colonoscopy Medicines:                Monitored Anesthesia Care Procedure:                Pre-Anesthesia Assessment:                           - Prior to the procedure, a History and Physical                            was performed, and patient medications and                            allergies were reviewed. The patient's tolerance of                            previous anesthesia was also reviewed. The risks                            and benefits of the procedure and the sedation                            options and risks were discussed with the patient.                            All questions were answered, and informed consent                            was obtained. Prior Anticoagulants: The patient has                            taken no anticoagulant or antiplatelet agents. ASA                            Grade Assessment: II - A patient with mild systemic                            disease. After reviewing the risks and benefits,                            the patient was deemed in satisfactory condition to                            undergo the procedure.  After obtaining informed consent, the colonoscope                            was passed under direct vision. Throughout the                            procedure, the patient's blood pressure, pulse, and                            oxygen saturations were monitored continuously. The                            CF [id number] #[id number] was introduced through the anus                            and advanced to the  the terminal ileum. The                            colonoscopy was performed without difficulty. The                            patient tolerated the procedure well. The quality                            of the bowel preparation was good. The terminal                            ileum, ileocecal valve, appendiceal orifice, and                            rectum were photographed. Scope In: 9:23:56 AM Scope Out: 9:41:16 AM Scope Withdrawal Time: 0 hours 12 minutes 16 seconds  Total Procedure Duration: 0 hours 17 minutes 20 seconds  Findings:                 The terminal ileum appeared normal.                           Three sessile polyps were found in the transverse                            colon and cecum. The polyps were 3 to 6 mm in size.                            These polyps were removed with a cold snare.                            Resection and retrieval were complete.                           Multiple diverticula were found in the sigmoid                            colon, descending colon, transverse colon and  ascending colon.                           Non-bleeding internal hemorrhoids were found during                            retroflexion. Complications:            No immediate complications. Estimated Blood Loss:     Estimated blood loss was minimal. Impression:               - The examined portion of the ileum was normal.                           - Three 3 to 6 mm polyps in the transverse colon                            and in the cecum, removed with a cold snare.                            Resected and retrieved.                           - Diverticulosis in the sigmoid colon, in the                            descending colon, in the transverse colon and in                            the ascending colon.                           - Non-bleeding internal hemorrhoids. Recommendation:           - Discharge patient to home (with escort).                            - Await pathology results.                           - The findings and recommendations were discussed                            with the patient. Dr Particia Lather "[name]" [name],  12/28/2022 9:45:03 AM

## 2022-12-28 NOTE — Patient Instructions (Signed)
-   Discharge patient to home (with escort). - Await pathology results. - The findings and recommendations were discussed with the patient.   YOU HAD AN ENDOSCOPIC PROCEDURE TODAY AT THE [facility]:   Refer to the procedure report that was given to you for any specific questions about what was found during the examination.  If the procedure report does not answer your questions, please call your gastroenterologist to clarify.  If you requested that your care partner not be given the details of your procedure findings, then the procedure report has been included in a sealed envelope for you to review at your convenience later.  YOU SHOULD EXPECT: Some feelings of bloating in the abdomen. Passage of more gas than usual.  Walking can help get rid of the air that was put into your GI tract during the procedure and reduce the bloating. If you had a lower endoscopy (such as a colonoscopy or flexible sigmoidoscopy) you may notice spotting of blood in your stool or on the toilet paper. If you underwent a bowel prep for your procedure, you may not have a normal bowel movement for a few days.  Please Note:  You might notice some irritation and congestion in your nose or some drainage.  This is from the oxygen used during your procedure.  There is no need for concern and it should clear up in a day or so.  SYMPTOMS TO REPORT IMMEDIATELY:  Following lower endoscopy (colonoscopy or flexible sigmoidoscopy):  Excessive amounts of blood in the stool  Significant tenderness or worsening of abdominal pains  Swelling of the abdomen that is new, acute  Fever of 100F or higher  For urgent or emergent issues, a gastroenterologist can be reached at any hour by calling [phone]. Do not use MyChart messaging for urgent concerns.    DIET:  We do recommend a small meal at first, but then you may proceed to your regular diet.  Drink plenty of fluids but you should avoid alcoholic beverages for 24  hours.  ACTIVITY:  You should plan to take it easy for the rest of today and you should NOT DRIVE or use heavy machinery until tomorrow (because of the sedation medicines used during the test).    FOLLOW UP: Our staff will call the number listed on your records the next business day following your procedure.  We will call around 7:15- 8:00 am to check on you and address any questions or concerns that you may have regarding the information given to you following your procedure. If we do not reach you, we will leave a message.     If any biopsies were taken you will be contacted by phone or by letter within the next 1-3 weeks.  Please call us at [phone] if you have not heard about the biopsies in 3 weeks.    SIGNATURES/CONFIDENTIALITY: You and/or your care partner have signed paperwork which will be entered into your electronic medical record.  These signatures attest to the fact that that the information above on your After Visit Summary has been reviewed and is understood.  Full responsibility of the confidentiality of this discharge information lies with you and/or your care-partner.

## 2022-12-28 NOTE — Progress Notes (Signed)
Called to room to assist during endoscopic procedure.  Patient ID and intended procedure confirmed with present staff. Received instructions for my participation in the procedure from the performing physician.  

## 2022-12-29 ENCOUNTER — Telehealth: Payer: Self-pay

## 2022-12-29 NOTE — Telephone Encounter (Signed)
Follow up call placed, no answer and no VM. 

## 2023-01-02 ENCOUNTER — Encounter: Payer: Self-pay | Admitting: Internal Medicine

## 2023-01-02 LAB — SURGICAL PATHOLOGY

## 2023-01-17 ENCOUNTER — Emergency Department (HOSPITAL_COMMUNITY): Payer: 59

## 2023-01-17 ENCOUNTER — Encounter (HOSPITAL_COMMUNITY): Payer: Self-pay

## 2023-01-17 ENCOUNTER — Emergency Department (HOSPITAL_COMMUNITY)
Admission: EM | Admit: 2023-01-17 | Discharge: 2023-01-17 | Disposition: A | Discharge status: Home / Self Care | Payer: 59 | Attending: Emergency Medicine | Admitting: Emergency Medicine

## 2023-01-17 ENCOUNTER — Other Ambulatory Visit: Payer: Self-pay

## 2023-01-17 DIAGNOSIS — M25551 Pain in right hip: Secondary | ICD-10-CM | POA: Diagnosis present

## 2023-01-17 MED ORDER — NAPROXEN 500 MG PO TABS: 500.0000 mg | ORAL_TABLET | Freq: Two times a day (BID) | ORAL | 0 refills | Status: AC

## 2023-01-17 MED ORDER — METHOCARBAMOL 500 MG PO TABS: 500.0000 mg | ORAL_TABLET | Freq: Two times a day (BID) | ORAL | 0 refills | Status: AC | PRN

## 2023-01-17 MED ORDER — DEXAMETHASONE SODIUM PHOSPHATE 10 MG/ML IJ SOLN
10.0000 mg | Freq: Once | INTRAMUSCULAR | Status: AC
  Administered 2023-01-17: 10 mg via INTRAMUSCULAR
  Filled 2023-01-17: qty 1

## 2023-01-17 NOTE — ED Provider Notes (Signed)
[place] EMERGENCY DEPARTMENT AT [facility] Provider Note   CSN: [id number] Arrival date & time: 01/17/23  1023     History  Chief Complaint  Patient presents with   Hip Pain    Daniel [name] is a 52 y.o. male.   Hip Pain   This patient is a 52 year old male with a history of a right-sided hip replacement, review of the medical record shows that he had this hip replacement done in March 2024, after that time the patient did overall well but has been complaining of pain in the right lower back to the buttock and the right hip since that time.  He has been followed by pain management and Dr. Horald Chestnut, he most recently saw the pain management clinic several days ago, he is on oxycodone at home to help with his pain which she states has not been helping recently.  He denies fevers or chills, nausea or vomiting and has no numbness or weakness but states that he has increasing pain in his buttock and hip when he tries to stand and walk, he gets better when he lays down and flexes his hip, he has no significant back pain at this time.    Home Medications Prior to Admission medications   Medication Sig Start Date End Date Taking? Authorizing Provider  methocarbamol (ROBAXIN) 500 MG tablet Take 1 tablet (500 mg total) by mouth 2 (two) times daily as needed for muscle spasms. 01/17/23  Yes Eber Hong, MD  naproxen (NAPROSYN) 500 MG tablet Take 1 tablet (500 mg total) by mouth 2 (two) times daily with a meal. 01/17/23  Yes Eber Hong, MD  acetaminophen (TYLENOL) 500 MG tablet Take 1,000 mg by mouth every 6 (six) hours as needed for mild pain (pain score 1-3) or moderate pain (pain score 4-6).    [provider]  albuterol (VENTOLIN HFA) 108 (90 Base) MCG/ACT inhaler Inhale 1 puff into the lungs every 6 (six) hours as needed for wheezing or shortness of breath. 11/06/22   Hoy Register, MD  allopurinol (ZYLOPRIM) 300 MG tablet TAKE 1 TABLET BY MOUTH TWICE A DAY  09/20/22   Hoy Register, MD  amLODipine (NORVASC) 5 MG tablet Take 1 tablet (5 mg total) by mouth daily. 09/18/22   Hoy Register, MD  carvedilol (COREG) 6.25 MG tablet Take 1 tablet (6.25 mg total) by mouth 2 (two) times daily with a meal. 09/18/22   Hoy Register, MD  Oxycodone HCl 10 MG TABS  05/08/22   [provider]  Phentermine-Topiramate 3.75-23 MG CP24 Take 1 capsule by mouth daily for 14 days, THEN 2 capsules daily. Patient not taking: Reported on 11/30/2022 09/18/22 11/01/22  Hoy Register, MD  triamcinolone cream (KENALOG) 0.1 % Apply 1 Application topically 2 (two) times daily. 11/06/22   Hoy Register, MD  colchicine 0.6 MG tablet Take 2 tabs (1.2mg ) at the onset of a Gout flare, may repeat 1 tab (0.6mg ) after 2 hours if symptoms persist 09/30/19 04/12/20  Hoy Register, MD      Allergies    Patient has no known allergies.    Review of Systems   Review of Systems  All other systems reviewed and are negative.   Physical Exam Updated Vital Signs BP 129/87   Pulse 88   Temp 98.8 F (37.1 C) (Oral)   Resp 14   Ht 1.753 m (5\' 9" )   Wt 123.4 kg   SpO2 97%   BMI 40.17 kg/m  Physical Exam Vitals  and nursing note reviewed.  Constitutional:      General: He is not in acute distress.    Appearance: He is well-developed.  HENT:     Head: Normocephalic and atraumatic.     Mouth/Throat:     Pharynx: No oropharyngeal exudate.  Eyes:     General: No scleral icterus.       Right eye: No discharge.        Left eye: No discharge.     Conjunctiva/sclera: Conjunctivae normal.     Pupils: Pupils are equal, round, and reactive to light.  Neck:     Thyroid: No thyromegaly.     Vascular: No JVD.  Cardiovascular:     Rate and Rhythm: Normal rate and regular rhythm.     Heart sounds: Normal heart sounds. No murmur heard.    No friction rub. No gallop.  Pulmonary:     Effort: Pulmonary effort is normal. No respiratory distress.     Breath sounds: Normal breath  sounds. No wheezing or rales.  Abdominal:     General: Bowel sounds are normal. There is no distension.     Palpations: Abdomen is soft. There is no mass.     Tenderness: There is no abdominal tenderness.  Musculoskeletal:        General: Tenderness present. Normal range of motion.     Cervical back: Normal range of motion and neck supple.     Right lower leg: No edema.     Left lower leg: No edema.     Comments: Mild tenderness with palpation of the right buttock, he has no significant increasing pain with internal and external rotation of the right hip, with flexion of the right hip the pain gets better and almost completely goes away.  Lymphadenopathy:     Cervical: No cervical adenopathy.  Skin:    General: Skin is warm and dry.     Findings: No erythema or rash.  Neurological:     Mental Status: He is alert.     Coordination: Coordination normal.     Comments: Normal strength and sensation distally, normal reflexes at the patellar tendons bilaterally.  Psychiatric:        Behavior: Behavior normal.     ED Results / Procedures / Treatments   Labs (all labs ordered are listed, but only abnormal results are displayed) Labs Reviewed - No data to display  EKG None  Radiology DG Hip Unilat W or Wo Pelvis 2-3 Views Right Result Date: 01/17/2023 CLINICAL DATA:  Right hip pain for several days.  No known injury. EXAM: DG HIP (WITH OR WITHOUT PELVIS) 2-3V RIGHT COMPARISON:  None Available. 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 There is no evidence of hip fracture or dislocation. No pelvic fracture or other focal bone lesion identified. IMPRESSION: Bilateral total hip arthroplasties. No acute findings. Electronically Signed   By: Danae Orleans M.D.   On: 01/17/2023 11:32    Procedures Procedures    Medications Ordered in ED Medications  dexamethasone (DECADRON) injection 10 mg (10 mg Intramuscular Given 01/17/23 1134)    ED Course/ Medical Decision Making/  A&P                                 Medical Decision Making Amount and/or Complexity of Data Reviewed Radiology: ordered.  Risk Prescription drug management.    This patient presents to the ED for concern of right hip  pain differential diagnosis includes abnormality in the joint with regards to the surgical prosthesis, infection seems less likely given the lack of fever or tachycardia and the chronic nature of the pain.  Could be neurologic or nerve related such as a radiculopathy given the improvement when bending or flexing the hip, could be labral, imaging warranted    Additional history obtained:  Additional history obtained from reviewed medical record External records from outside source obtained and reviewed including prior surgical notes and pain management visit follow-ups   Lab Tests:  I Ordered, and personally interpreted labs.  The pertinent results include: Not indicated   Imaging Studies ordered:  I ordered imaging studies including x-ray of the right hip I independently visualized and interpreted imaging which showed no acute abnormalities of the surgical joint I agree with the radiologist interpretation   Medicines ordered and prescription drug management:  I ordered medication including Decadron for pain and inflammation Reevaluation of the patient after these medicines showed that the patient improved I have reviewed the patients home medicines and have made adjustments as needed   Problem List / ED Course:  Patient appears well, low risk for discharge, vitals reviewed and normal, patient given instructions on close follow-up   Social Determinants of Health:  Smoker   I have discussed with the patient at the bedside the results, and the meaning of these results.  They have had opportunity to ask questions,  expressed their understanding to the need for follow-up with primary care physician           Final Clinical Impression(s) / ED  Diagnoses Final diagnoses:  Right hip pain    Rx / DC Orders ED Discharge Orders          Ordered    naproxen (NAPROSYN) 500 MG tablet  2 times daily with meals        01/17/23 1138    methocarbamol (ROBAXIN) 500 MG tablet  2 times daily PRN        01/17/23 1138              Eber Hong, MD 01/17/23 1139

## 2023-01-17 NOTE — Discharge Instructions (Signed)
Your x-rays look good, no signs of abnormalities with your hips over the prior surgical prosthetic hips, there is no signs of infection, we have given you a shot of a steroid and I have prescribed 2 medications for home  Please take Naprosyn, 500mg  by mouth twice daily as needed for pain - this in an antiinflammatory medicine (NSAID) and is similar to ibuprofen - many people feel that it is stronger than ibuprofen and it is easier to take since it is a smaller pill.  Please use this only for 1 week - if your pain persists, you will need to follow up with your doctor in the office for ongoing guidance and pain control.  Please take Robaxin, 500 mg up to 2 or 3 times a day as needed for muscle spasm, this is a muscle relaxer, it may cause generalized weakness, sleepiness and you should not drive or do important things while taking this medication.  This includes driving a vehicle or taking care of young children, these things should not be done while taking this medication.   Follow-up with your family doctor or your orthopedist or pain medicine clinic this coming week, ER for worsening symptoms

## 2023-01-17 NOTE — ED Notes (Signed)
Pt d/c home per EDP order. Discharge summary reviewed, pt verbalizes understanding. NAD.

## 2023-01-17 NOTE — ED Triage Notes (Signed)
Pt came to ED for right hip pain.Denies falling, Recent hip replacement in March. Pain started MOnday. Denies fevers.

## 2023-01-29 ENCOUNTER — Telehealth: Payer: Self-pay

## 2023-01-29 NOTE — Progress Notes (Signed)
 Transition Care Management Unsuccessful Follow-up Telephone Call  Date of discharge and from where:  01/17/2023 [facility]  Attempts:  1st Attempt  Reason for unsuccessful TCM follow-up call:  No answer/busy  Wanda Rideout [facility]  [facility], [facility] Resource Care Guide Direct Dial: [phone]  Website: [email or web address]

## 2023-01-30 ENCOUNTER — Telehealth: Payer: Self-pay

## 2023-01-30 NOTE — Progress Notes (Signed)
 Transition Care Management Unsuccessful Follow-up Telephone Call  Date of discharge and from where:  01/17/2023 [facility]  Attempts:  2nd Attempt  Reason for unsuccessful TCM follow-up call:  No answer/busy  Avante Carneiro [facility]  [facility], [facility] Resource Care Guide Direct Dial: [phone]  Website: [email or web address]

## 2023-02-07 ENCOUNTER — Other Ambulatory Visit: Payer: Self-pay | Admitting: Family Medicine

## 2023-02-07 DIAGNOSIS — I10 Essential (primary) hypertension: Secondary | ICD-10-CM

## 2023-03-05 ENCOUNTER — Other Ambulatory Visit: Payer: Self-pay | Admitting: Family Medicine

## 2023-03-05 DIAGNOSIS — I10 Essential (primary) hypertension: Secondary | ICD-10-CM

## 2023-03-18 ENCOUNTER — Other Ambulatory Visit: Payer: Self-pay | Admitting: Family Medicine

## 2023-03-18 DIAGNOSIS — M1A00X0 Idiopathic chronic gout, unspecified site, without tophus (tophi): Secondary | ICD-10-CM

## 2023-03-21 ENCOUNTER — Ambulatory Visit: Payer: Medicaid Other | Attending: Family Medicine | Admitting: Family Medicine

## 2023-03-21 ENCOUNTER — Encounter: Payer: Self-pay | Admitting: Family Medicine

## 2023-03-21 VITALS — BP 136/89 | HR 87 | Ht 69.0 in | Wt 279.4 lb

## 2023-03-21 DIAGNOSIS — E1159 Type 2 diabetes mellitus with other circulatory complications: Secondary | ICD-10-CM

## 2023-03-21 DIAGNOSIS — Z7985 Long-term (current) use of injectable non-insulin antidiabetic drugs: Secondary | ICD-10-CM

## 2023-03-21 DIAGNOSIS — I152 Hypertension secondary to endocrine disorders: Secondary | ICD-10-CM

## 2023-03-21 DIAGNOSIS — E1169 Type 2 diabetes mellitus with other specified complication: Secondary | ICD-10-CM

## 2023-03-21 DIAGNOSIS — J4520 Mild intermittent asthma, uncomplicated: Secondary | ICD-10-CM | POA: Diagnosis not present

## 2023-03-21 DIAGNOSIS — M1A00X0 Idiopathic chronic gout, unspecified site, without tophus (tophi): Secondary | ICD-10-CM

## 2023-03-21 DIAGNOSIS — E119 Type 2 diabetes mellitus without complications: Secondary | ICD-10-CM | POA: Insufficient documentation

## 2023-03-21 LAB — POCT GLYCOSYLATED HEMOGLOBIN (HGB A1C): HbA1c, POC (prediabetic range): 6.7 % — AB (ref 5.7–6.4)

## 2023-03-21 MED ORDER — ATORVASTATIN CALCIUM 20 MG PO TABS: 20.0000 mg | ORAL_TABLET | Freq: Every day | ORAL | 1 refills | Status: DC

## 2023-03-21 MED ORDER — CARVEDILOL 6.25 MG PO TABS
6.2500 mg | ORAL_TABLET | Freq: Two times a day (BID) | ORAL | 1 refills | Status: DC
Start: 2023-03-21 — End: 2023-09-17

## 2023-03-21 MED ORDER — ACCU-CHEK AVIVA PLUS VI STRP: ORAL_STRIP | 12 refills | Status: DC

## 2023-03-21 MED ORDER — ACCU-CHEK AVIVA PLUS W/DEVICE KIT: 1.0000 | PACK | Freq: Every day | 0 refills | Status: DC

## 2023-03-21 MED ORDER — ALLOPURINOL 300 MG PO TABS: 300.0000 mg | ORAL_TABLET | Freq: Two times a day (BID) | ORAL | 1 refills | Status: DC

## 2023-03-21 MED ORDER — OZEMPIC (0.25 OR 0.5 MG/DOSE) 2 MG/3ML ~~LOC~~ SOPN
0.5000 mg | PEN_INJECTOR | SUBCUTANEOUS | 6 refills | Status: DC
Start: 2023-03-21 — End: 2023-03-29

## 2023-03-21 MED ORDER — OZEMPIC (0.25 OR 0.5 MG/DOSE) 2 MG/1.5ML ~~LOC~~ SOPN: 0.2500 mg | PEN_INJECTOR | SUBCUTANEOUS | 0 refills | Status: DC

## 2023-03-21 MED ORDER — AMLODIPINE BESYLATE 5 MG PO TABS
5.0000 mg | ORAL_TABLET | Freq: Every day | ORAL | 1 refills | Status: DC
Start: 2023-03-21 — End: 2023-09-17

## 2023-03-21 MED ORDER — ALBUTEROL SULFATE HFA 108 (90 BASE) MCG/ACT IN AERS: 1.0000 | INHALATION_SPRAY | Freq: Four times a day (QID) | RESPIRATORY_TRACT | 0 refills | Status: DC | PRN

## 2023-03-21 NOTE — Progress Notes (Signed)
 Subjective:  Patient ID: Daniel [name], male    DOB: 09-26-1970  Age: 53 y.o. MRN: [id number]  CC: Medical Management of Chronic Issues (Refill on inhaler)   HPI: Daniel [name] is a 53 y.o. year old male with a medical history significant for Previous alcohol abuse, hypertension, pulmonary embolism (in 02/2018; completed course of anticoagulation), Type 2 DM(A1c 6.7), bilateral hip osteoarthritis, status post left THA (in 11/2021) s/p right THA (in 03/2022), nicotine dependence (smokes 1 pack of cigarettes over the 1 week, less than 20-pack-year)     Discussed the use of AI scribe software for clinical note transcription with the patient, who gave verbal consent to proceed.  History of Present Illness He presents for a medication refill. He reports difficulty obtaining his inhaler, despite a prescription written in October. He continues to smoke, albeit reduced to 3-4 cigarettes per day. He takes amlodipine and carvedilol for hypertension, allopurinol for gout daily, and a muscle relaxant as needed.  The patient's A1c has increased from 5.7 to 6.7, indicating a new diagnosis of type 2 diabetes. He has gained approximately seven pounds since December. He previously used Ozempic for preDM, but insurance would not cover it. He expresses a desire to resume Ozempic.  The patient also reports a recent colonoscopy in December.    Past Medical History:  Diagnosis Date   Anxiety    Arthritis    Asthma    Dyspnea    Gout    Hypertension    Pre-diabetes     Past Surgical History:  Procedure Laterality Date   TOTAL HIP ARTHROPLASTY Left 12/01/2021   Procedure: TOTAL HIP ARTHROPLASTY ANTERIOR APPROACH;  Surgeon: Samson Frederic, MD;  Location: [facility];  Service: Orthopedics;  Laterality: Left;   TOTAL HIP ARTHROPLASTY Right 03/29/2022   Procedure: TOTAL HIP ARTHROPLASTY ANTERIOR APPROACH;  Surgeon: Samson Frederic, MD;  Location: [facility];  Service: Orthopedics;  Laterality: Right;  120    WISDOM TOOTH EXTRACTION     age 67    Family History  Problem Relation Age of Onset   Heart attack Mother    Colon cancer Neg Hx    Colon polyps Neg Hx    Rectal cancer Neg Hx    Stomach cancer Neg Hx    Esophageal cancer Neg Hx     Social History   Socioeconomic History   Marital status: Divorced    Spouse name: Not on file   Number of children: Not on file   Years of education: Not on file   Highest education level: Not on file  Occupational History   Not on file  Tobacco Use   Smoking status: Every Day    Current packs/day: 0.30    Average packs/day: 0.3 packs/day for 33.0 years (9.9 ttl pk-yrs)    Types: Cigarettes   Smokeless tobacco: Never  Vaping Use   Vaping status: Never Used  Substance and Sexual Activity   Alcohol use: Never   Drug use: Never   Sexual activity: Yes    Birth control/protection: None    Comment: Married  Other Topics Concern   Not on file  Social History Narrative   ** Merged History Encounter **       Social Drivers of Health   Financial Resource Strain: Low Risk  (11/07/2022)   Overall Financial Resource Strain (CARDIA)    Difficulty of Paying Living Expenses: Not hard at all  Food Insecurity: No Food Insecurity (11/07/2022)   Hunger Vital Sign  Worried About Programme researcher, broadcasting/film/video in the Last Year: Never true    Ran Out of Food in the Last Year: Never true  Transportation Needs: No Transportation Needs (11/07/2022)   PRAPARE - Administrator, Civil Service (Medical): No    Lack of Transportation (Non-Medical): No  Physical Activity: Insufficiently Active (11/07/2022)   Exercise Vital Sign    Days of Exercise per Week: 2 days    Minutes of Exercise per Session: 60 min  Stress: No Stress Concern Present (11/07/2022)   [employer] of Occupational Health - Occupational Stress Questionnaire    Feeling of Stress : Not at all  Social Connections: Unknown (11/07/2022)   Social Connection and Isolation Panel [NHANES]     Frequency of Communication with Friends and Family: Three times a week    Frequency of Social Gatherings with Friends and Family: Three times a week    Attends Religious Services: Patient declined    Active Member of Clubs or Organizations: No    Attends Banker Meetings: Never    Marital Status: Married    No Known Allergies  Outpatient Medications Prior to Visit  Medication Sig Dispense Refill   acetaminophen (TYLENOL) 500 MG tablet Take 1,000 mg by mouth every 6 (six) hours as needed for mild pain (pain score 1-3) or moderate pain (pain score 4-6).     methocarbamol (ROBAXIN) 500 MG tablet Take 1 tablet (500 mg total) by mouth 2 (two) times daily as needed for muscle spasms. 20 tablet 0   naproxen (NAPROSYN) 500 MG tablet Take 1 tablet (500 mg total) by mouth 2 (two) times daily with a meal. 30 tablet 0   Oxycodone HCl 10 MG TABS      triamcinolone cream (KENALOG) 0.1 % Apply 1 Application topically 2 (two) times daily. 80 g 1   albuterol (VENTOLIN HFA) 108 (90 Base) MCG/ACT inhaler Inhale 1 puff into the lungs every 6 (six) hours as needed for wheezing or shortness of breath. 18 g 0   allopurinol (ZYLOPRIM) 300 MG tablet TAKE 1 TABLET BY MOUTH TWICE A DAY 60 tablet 0   amLODipine (NORVASC) 5 MG tablet TAKE 1 TABLET (5 MG TOTAL) BY MOUTH DAILY. 30 tablet 0   carvedilol (COREG) 6.25 MG tablet TAKE 1 TABLET BY MOUTH 2 TIMES DAILY WITH A MEAL. (DOSE DECREASE) 60 tablet 0   Phentermine-Topiramate 3.75-23 MG CP24 Take 1 capsule by mouth daily for 14 days, THEN 2 capsules daily. (Patient not taking: Reported on 11/30/2022) 74 capsule 3   No facility-administered medications prior to visit.     ROS Review of Systems  Constitutional:  Negative for activity change and appetite change.  HENT:  Negative for sinus pressure and sore throat.   Respiratory:  Negative for chest tightness, shortness of breath and wheezing.   Cardiovascular:  Negative for chest pain and palpitations.   Gastrointestinal:  Negative for abdominal distention, abdominal pain and constipation.  Genitourinary: Negative.   Musculoskeletal: Negative.   Psychiatric/Behavioral:  Negative for behavioral problems and dysphoric mood.     Objective:  BP 136/89   Pulse 87   Ht 5\' 9"  (1.753 m)   Wt 279 lb 6.4 oz (126.7 kg)   SpO2 98%   BMI 41.26 kg/m      03/21/2023    3:18 PM 01/17/2023   11:35 AM 01/17/2023   10:38 AM  BP/Weight  Systolic BP 136 115 129  Diastolic BP 89 82 87  Wt. (Lbs)  279.4    BMI 41.26 kg/m2      Wt Readings from Last 3 Encounters:  03/21/23 279 lb 6.4 oz (126.7 kg)  01/17/23 272 lb (123.4 kg)  12/28/22 272 lb (123.4 kg)     Physical Exam Constitutional:      Appearance: He is well-developed. He is obese.  Cardiovascular:     Rate and Rhythm: Normal rate.     Heart sounds: Normal heart sounds. No murmur heard. Pulmonary:     Effort: Pulmonary effort is normal.     Breath sounds: Normal breath sounds. No wheezing or rales.  Chest:     Chest wall: No tenderness.  Abdominal:     General: Bowel sounds are normal. There is no distension.     Palpations: Abdomen is soft. There is no mass.     Tenderness: There is no abdominal tenderness.  Musculoskeletal:        General: Normal range of motion.     Right lower leg: No edema.     Left lower leg: No edema.  Neurological:     Mental Status: He is alert and oriented to person, place, and time.  Psychiatric:        Mood and Affect: Mood normal.        Latest Ref Rng & Units 07/13/2022   10:30 AM 05/19/2022   10:56 PM 05/19/2022    5:57 PM  CMP  Glucose 70 - 99 mg/dL 409  811  914   BUN 6 - 24 mg/dL 8  10  11    Creatinine 0.76 - 1.27 mg/dL 7.82  9.56  2.13   Sodium 134 - 144 mmol/L 139  133  132   Potassium 3.5 - 5.2 mmol/L 4.1  3.2  3.1   Chloride 96 - 106 mmol/L 100  95  97   CO2 20 - 29 mmol/L 26  28  23    Calcium 8.7 - 10.2 mg/dL 9.8  9.2  9.1   Total Protein 6.5 - 8.1 g/dL  7.6  7.7   Total  Bilirubin 0.3 - 1.2 mg/dL  0.7  1.2   Alkaline Phos 38 - 126 U/L  67  70   AST 15 - 41 U/L  16  15   ALT 0 - 44 U/L  9  9     Lipid Panel     Component Value Date/Time   CHOL 160 01/09/2022 0927   TRIG 120 01/09/2022 0927   HDL 37 (L) 01/09/2022 0927   CHOLHDL 3.8 08/11/2020 1056   LDLCALC 101 (H) 01/09/2022 0927    CBC    Component Value Date/Time   WBC 17.3 (H) 05/19/2022 2256   RBC 4.22 05/19/2022 2256   HGB 12.9 (L) 05/19/2022 2256   HGB 15.0 05/28/2019 1622   HCT 37.7 (L) 05/19/2022 2256   HCT 43.2 05/28/2019 1622   PLT 288 05/19/2022 2256   PLT 306 05/28/2019 1622   MCV 89.3 05/19/2022 2256   MCV 96 05/28/2019 1622   MCH 30.6 05/19/2022 2256   MCHC 34.2 05/19/2022 2256   RDW 15.6 (H) 05/19/2022 2256   RDW 15.5 (H) 05/28/2019 1622   LYMPHSABS 1.8 05/28/2019 1622   MONOABS 0.9 11/24/2018 1242   EOSABS 0.1 05/28/2019 1622   BASOSABS 0.1 05/28/2019 1622    Lab Results  Component Value Date   HGBA1C 6.7 (A) 03/21/2023    Assessment & Plan:  Assessment and Plan Assessment & Plan Type 2 Diabetes Mellitus Newly diagnosed  with an A1c of 6.7, up from 5.7. Recent weight gain likely contributing to the development of diabetes. Patient previously on Ozempic for prediabetes, but insurance would not cover it. -Start Ozempic 0.25mg  for 4 weeks, then increase to 0.5mg . -Order glucometer for patient to check blood glucose 2-3 times per week before breakfast. Target range 80-120. -Add statin for primary prevention of cardiovascular disease due to increased risk with diabetes. -Counseled on Diabetic diet, my plate method, 960 minutes of moderate intensity exercise/week Blood sugar logs with fasting goals of 80-120 mg/dl, random of less than 454 and in the event of sugars less than 60 mg/dl or greater than 098 mg/dl encouraged to notify the clinic. Advised on the need for annual eye exams, annual foot exams, Pneumonia vaccine.   Asthma Patient reports difficulty obtaining  inhaler from pharmacy. -Resend prescription for inhaler to [facility].  Hypertension Well controlled on current regimen of amlodipine and carvedilol. -Continue current medications. -Counseled on blood pressure goal of less than 130/80, low-sodium, DASH diet, medication compliance, 150 minutes of moderate intensity exercise per week. Discussed medication compliance, adverse effects.   Gout Patient taking allopurinol daily. -Continue allopurinol.  Tobacco Use Patient reports smoking 3-4 cigarettes per day. -Encourage continued efforts to quit smoking.  General Health Maintenance -Order fasting blood work for tomorrow. -Encourage regular exercise for weight loss and improved glycemic control. -Colonoscopy completed in December, no further action needed at this time. -Follow-up after blood test results are available.      Meds ordered this encounter  Medications   albuterol (VENTOLIN HFA) 108 (90 Base) MCG/ACT inhaler    Sig: Inhale 1 puff into the lungs every 6 (six) hours as needed for wheezing or shortness of breath.    Dispense:  18 g    Refill:  0   allopurinol (ZYLOPRIM) 300 MG tablet    Sig: Take 1 tablet (300 mg total) by mouth 2 (two) times daily.    Dispense:  180 tablet    Refill:  1   amLODipine (NORVASC) 5 MG tablet    Sig: Take 1 tablet (5 mg total) by mouth daily.    Dispense:  90 tablet    Refill:  1   carvedilol (COREG) 6.25 MG tablet    Sig: Take 1 tablet (6.25 mg total) by mouth 2 (two) times daily with a meal.    Dispense:  180 tablet    Refill:  1   Semaglutide,0.25 or 0.5MG /DOS, (OZEMPIC, 0.25 OR 0.5 MG/DOSE,) 2 MG/1.5ML SOPN    Sig: Inject 0.25 mg into the skin once a week. For 4 weeks then increase to 0.5mg     Dispense:  2 mL    Refill:  0   Semaglutide,0.25 or 0.5MG /DOS, (OZEMPIC, 0.25 OR 0.5 MG/DOSE,) 2 MG/3ML SOPN    Sig: Inject 0.5 mg into the skin once a week.    Dispense:  3 mL    Refill:  6   atorvastatin (LIPITOR) 20 MG tablet    Sig: Take 1  tablet (20 mg total) by mouth daily.    Dispense:  90 tablet    Refill:  1   glucose blood (ACCU-CHEK AVIVA PLUS) test strip    Sig: Use as instructed tid    Dispense:  100 each    Refill:  12   Blood Glucose Monitoring Suppl (ACCU-CHEK AVIVA PLUS) w/Device KIT    Sig: 1 each by Does not apply route daily.    Dispense:  1 kit    Refill:  0    Follow-up: Return in about 6 months (around 09/18/2023) for Chronic medical conditions.       Hoy Register, MD, FAAFP. [facility] [place], [place] [phone]   03/21/2023, 4:33 PM

## 2023-03-21 NOTE — Patient Instructions (Signed)
 Blood Glucose Monitoring, Adult To manage your diabetes, you'll need to keep track of your blood sugar. This is called blood glucose monitoring. Check your blood glucose as often as told. Keep a journal of your results over time. This can help you: Know when to adjust your diabetes management plan with your health care provider. See how food, exercise, illness, and medicines affect your blood glucose. Know what your blood glucose is at any time. Your provider will set specific goals for your blood glucose levels. In many cases, these goals may be: Before meals: 80-130 mg/dL (3.6-6.4 mmol/L). After meals: below 180 mg/dL (10 mmol/L). A1C level: less than 7%. Supplies needed: Blood glucose meter. Test strips for your meter. Each brand of meter has its own strips. You must use the strips that came with your meter. A lancet. This is a sharp device used to poke your finger. Do not use a lancet more than once. A journal or logbook to write down your results. How to check your blood glucose Checking your blood glucose  Wash your hands with soap and water for at least 20 seconds. Use the lancet to poke the side of your finger. Do not poke the tip of your finger. Also, try not to use the same finger each time. Gently squeeze the finger until a small drop of blood appears. Follow the meter instructions on how to insert the test strip, apply blood to the strip, and use the meter. Write down your result and any notes. Using different sites Some blood glucose meters allow testing on other parts of your body to test your blood. The most common places are the forearm, thigh, upper arm, and palm of the hand. Check your meter's instructions. Using different sites may not be as accurate as your fingers. If you think you have low blood glucose, only use your finger. General tips Blood glucose log  Write down the result each time you check your blood glucose. Note anything that may be affecting your blood  glucose. This can help you and your provider: Look for patterns over time. Adjust your management plan as needed. Check if your meter has an app or lets you download your records to a computer. Most meters keep a record of glucose readings in the meter. If you have type 1 diabetes: Check your blood glucose as often as told. This may be: Before each meal and snack. Two hours after a meal. Before bedtime. If you have symptoms of hypoglycemia. After treating your hypoglycemia. Before doing things that have a risk of injury, such as driving or using machinery. Before and after exercise. Between 2:00 a.m. and 3:00 a.m. You may need to check your blood glucose more often, such as up to 6-10 times a day, if: You have diabetes that is not well controlled. You are ill. You have a history of severe hypoglycemia. You have hypoglycemia unawareness. If you have type 2 diabetes: You may need to check your blood glucose 2 or more times a day. Check your blood glucose as often as told by your provider. This may include: Before and after exercise. Before doing things that have a risk of injury, such as driving or using machinery. You may need to check your blood glucose more often if: Your medicine is being adjusted. Your diabetes is not well controlled. You are ill. General tips Always have your blood glucose meter and supplies with you. After you use a few boxes of test strips, adjust your blood glucose meter  as needed. Follow the meter instructions. If you have questions or need help, all blood glucose meters have a 24-hour hotline phone number that you can call. Also, contact your provider with any questions or concerns. Where to find more information The American Diabetes Association: diabetes.org The Association of Diabetes Care & Education Specialists: diabeteseducator.org Contact a health care provider if: Your blood glucose is at or above 240 mg/dL (24.4 mmol/L) for 2 days in a row. You  have been sick or have had a fever for 2 days or longer and are not getting better. You have any of these problems for more than 6 hours: You cannot eat or drink. You have nausea or vomiting. You have diarrhea. Get help right away if: Your blood glucose is lower than 54 mg/dL (3 mmol/L). You become confused, or you have trouble thinking clearly. You have trouble breathing. You have moderate to high ketone levels in your pee. These symptoms may be an emergency. Get help right away. Call 911. Do not wait to see if the symptoms will go away. Do not drive yourself to the hospital. This information is not intended to replace advice given to you by your health care provider. Make sure you discuss any questions you have with your health care provider. Document Revised: 08/22/2022 Document Reviewed: 11/25/2021 Elsevier Patient Education  2024 [employer].

## 2023-03-22 ENCOUNTER — Ambulatory Visit: Payer: 59 | Attending: Family Medicine

## 2023-03-22 DIAGNOSIS — E1169 Type 2 diabetes mellitus with other specified complication: Secondary | ICD-10-CM

## 2023-03-23 ENCOUNTER — Encounter: Payer: Self-pay | Admitting: Family Medicine

## 2023-03-23 LAB — CMP14+EGFR
ALT: 10 [IU]/L (ref 0–44)
AST: 16 [IU]/L (ref 0–40)
Albumin: 4.2 g/dL (ref 4.1–5.1)
Alkaline Phosphatase: 89 [IU]/L (ref 44–121)
BUN/Creatinine Ratio: 12 (ref 9–20)
BUN: 11 mg/dL (ref 6–24)
Bilirubin Total: 0.3 mg/dL (ref 0.0–1.2)
CO2: 24 mmol/L (ref 20–29)
Calcium: 9.3 mg/dL (ref 8.7–10.2)
Chloride: 101 mmol/L (ref 96–106)
Creatinine, Ser: 0.91 mg/dL (ref 0.76–1.27)
Globulin, Total: 3 g/dL (ref 1.5–4.5)
Glucose: 188 mg/dL — ABNORMAL HIGH (ref 70–99)
Potassium: 4.4 mmol/L (ref 3.5–5.2)
Sodium: 138 mmol/L (ref 134–144)
Total Protein: 7.2 g/dL (ref 6.0–8.5)
eGFR: 103 mL/min/{1.73_m2} (ref >59)

## 2023-03-23 LAB — LP+NON-HDL CHOLESTEROL
Cholesterol, Total: 153 mg/dL (ref 100–199)
HDL: 37 mg/dL — ABNORMAL LOW (ref >39)
LDL Chol Calc (NIH): 88 mg/dL (ref 0–99)
Total Non-HDL-Chol (LDL+VLDL): 116 mg/dL (ref 0–129)
Triglycerides: 161 mg/dL — ABNORMAL HIGH (ref 0–149)
VLDL Cholesterol Cal: 28 mg/dL (ref 5–40)

## 2023-03-23 LAB — MICROALBUMIN / CREATININE URINE RATIO
Creatinine, Urine: 240.6 mg/dL
Microalb/Creat Ratio: 5 mg/g{creat} (ref 0–29)
Microalbumin, Urine: 12.7 ug/mL

## 2023-03-28 ENCOUNTER — Other Ambulatory Visit: Payer: Self-pay

## 2023-03-29 ENCOUNTER — Telehealth: Payer: Self-pay | Admitting: Pharmacist

## 2023-03-29 MED ORDER — LIRAGLUTIDE 18 MG/3ML ~~LOC~~ SOPN: 0.6000 mg | PEN_INJECTOR | Freq: Every day | SUBCUTANEOUS | 0 refills | Status: DC

## 2023-03-29 MED ORDER — METFORMIN HCL ER 500 MG PO TB24
500.0000 mg | ORAL_TABLET | Freq: Every day | ORAL | 0 refills | Status: DC
Start: 2023-03-29 — End: 2023-05-01

## 2023-03-29 NOTE — Telephone Encounter (Signed)
 Hey friend,   Dr. Alvis Lemmings sent in the metformin and Victoza. Sending this to you so you are aware.

## 2023-03-29 NOTE — Addendum Note (Signed)
 Addended by: Hoy Register on: 03/29/2023 05:36 PM   Modules accepted: Orders

## 2023-03-29 NOTE — Telephone Encounter (Signed)
 I sent a Prescription for Victoza and Metformin to his Pharmacy. Can you please inform him of this. Also he needs to send me a mug chart message in a month so I can switch him  back to Ozempic. Thank you

## 2023-03-29 NOTE — Telephone Encounter (Signed)
 Hey friend,   Community education officer requires trial and failure of Victoza and Trulicity before they will cover Ozempic. He has only tried Trulicity before. Additionally, he will need a metformin rxn before a PA can be approved with [facility]

## 2023-04-13 ENCOUNTER — Other Ambulatory Visit: Payer: Self-pay

## 2023-04-13 ENCOUNTER — Telehealth: Payer: Self-pay

## 2023-04-13 NOTE — Telephone Encounter (Signed)
 Pharmacy Patient Advocate Encounter   Received notification from CoverMyMeds that prior authorization for LIRAGLUTIDE(VICTOZA) is required/requested.   Insurance verification completed.   The patient is insured through [facility] .   Per test claim: PA required; PA submitted to above mentioned insurance via CoverMyMeds Key/confirmation #/EOC [id number] Status is pending

## 2023-04-13 NOTE — Telephone Encounter (Signed)
 Copied from CRM [id number]. Topic: Clinical - Prescription Issue >> Apr 13, 2023 10:39 AM Fredrich Romans wrote: Reason for CRM: Patient was advised from pharmacy that medication liraglutide (VICTOZA) 18 MG/3ML SOPN needs a PA, as well as Blood Glucose Monitoring Suppl (ACCU-CHEK AVIVA PLUS) w/Device KIT and test strips

## 2023-04-17 ENCOUNTER — Other Ambulatory Visit: Payer: Self-pay | Admitting: Family Medicine

## 2023-04-17 DIAGNOSIS — J4520 Mild intermittent asthma, uncomplicated: Secondary | ICD-10-CM

## 2023-04-19 ENCOUNTER — Other Ambulatory Visit: Payer: Self-pay

## 2023-04-20 ENCOUNTER — Telehealth: Payer: Self-pay

## 2023-04-20 ENCOUNTER — Other Ambulatory Visit: Payer: Self-pay

## 2023-04-20 NOTE — Telephone Encounter (Signed)
 Yes he can

## 2023-04-20 NOTE — Telephone Encounter (Signed)
 Copied from CRM [id number]. Topic: General - Other >> Apr 20, 2023 11:32 AM Eunice Blase wrote: Reason for CRM: Pt called would like to know if he is allowed to donate plasma? Please call pt at [phone].

## 2023-04-25 NOTE — Telephone Encounter (Signed)
VM left informing patient to return phone call.

## 2023-04-30 ENCOUNTER — Telehealth: Payer: Self-pay | Admitting: Pharmacist

## 2023-04-30 NOTE — Telephone Encounter (Signed)
 Wanted to check with you concerning the Victoza approval. Were we able to get anything back on this yet?

## 2023-05-01 ENCOUNTER — Other Ambulatory Visit: Payer: Self-pay | Admitting: Family Medicine

## 2023-05-01 MED ORDER — METFORMIN HCL ER 500 MG PO TB24: 500.0000 mg | ORAL_TABLET | Freq: Every day | ORAL | 1 refills | Status: DC

## 2023-05-01 NOTE — Progress Notes (Signed)
 Patient requires trial of metformin x 3 months (total daily dose of 1500mg  or more) and A1c of 7.5 or greater prior to approval for GLP-1 RA per information received from the clinical pharmacist.

## 2023-05-02 ENCOUNTER — Other Ambulatory Visit: Payer: Self-pay

## 2023-05-29 ENCOUNTER — Encounter (HOSPITAL_COMMUNITY): Payer: Self-pay | Admitting: *Deleted

## 2023-05-29 ENCOUNTER — Ambulatory Visit (HOSPITAL_COMMUNITY)
Admission: EM | Admit: 2023-05-29 | Discharge: 2023-05-29 | Disposition: A | Discharge status: Home / Self Care | Attending: Emergency Medicine | Admitting: Emergency Medicine

## 2023-05-29 DIAGNOSIS — R600 Localized edema: Secondary | ICD-10-CM | POA: Diagnosis not present

## 2023-05-29 LAB — COMPREHENSIVE METABOLIC PANEL WITH GFR
ALT: 13 U/L (ref 0–44)
AST: 21 U/L (ref 15–41)
Albumin: 3.3 g/dL — ABNORMAL LOW (ref 3.5–5.0)
Alkaline Phosphatase: 46 U/L (ref 38–126)
Anion gap: 7 (ref 5–15)
BUN: 6 mg/dL (ref 6–20)
CO2: 29 mmol/L (ref 22–32)
Calcium: 9 mg/dL (ref 8.9–10.3)
Chloride: 101 mmol/L (ref 98–111)
Creatinine, Ser: 0.86 mg/dL (ref 0.61–1.24)
GFR, Estimated: >60 mL/min (ref >60)
Glucose, Bld: 98 mg/dL (ref 70–99)
Potassium: 4.1 mmol/L (ref 3.5–5.1)
Sodium: 137 mmol/L (ref 135–145)
Total Bilirubin: 0.6 mg/dL (ref 0.0–1.2)
Total Protein: 6.2 g/dL — ABNORMAL LOW (ref 6.5–8.1)

## 2023-05-29 LAB — POCT URINALYSIS DIP (MANUAL ENTRY)
Bilirubin, UA: NEGATIVE
Blood, UA: NEGATIVE
Glucose, UA: NEGATIVE mg/dL
Ketones, POC UA: NEGATIVE mg/dL
Leukocytes, UA: NEGATIVE
Nitrite, UA: NEGATIVE
Protein Ur, POC: NEGATIVE mg/dL
Spec Grav, UA: 1.025
Urobilinogen, UA: 0.2 U/dL
pH, UA: 7

## 2023-05-29 LAB — CBC
HCT: 42.3 % (ref 39.0–52.0)
Hemoglobin: 14.1 g/dL (ref 13.0–17.0)
MCH: 30.5 pg (ref 26.0–34.0)
MCHC: 33.3 g/dL (ref 30.0–36.0)
MCV: 91.6 fL (ref 80.0–100.0)
Platelets: 326 10*3/uL (ref 150–400)
RBC: 4.62 MIL/uL (ref 4.22–5.81)
RDW: 15 % (ref 11.5–15.5)
WBC: 9.8 10*3/uL (ref 4.0–10.5)
nRBC: 0 % (ref 0.0–0.2)

## 2023-05-29 LAB — TSH: TSH: 2.115 u[IU]/mL (ref 0.350–4.500)

## 2023-05-29 NOTE — ED Triage Notes (Signed)
 Pt states he has bilateral leg swelling since returning to work within the last 2 weeks. He denies any pain. He takes tylenol  as needed and oxy as scheduled for hip pain post surgery.

## 2023-05-29 NOTE — Discharge Instructions (Signed)
 Your EKG and urinalysis were overall reassuring today.  We have drawn some lab work to evaluate underlying causes for your leg swelling.  If any of these results are concerning we will give you a call and advise treatment at that time.   I recommend wearing compression stockings to help with swelling in her legs.  Rest and elevate your legs as often as possible.   Otherwise follow-up with primary care provider for further evaluation of your leg swelling.  If you develop shortness of breath, chest pain, or worsening leg swelling please seek immediate medical treatment in the emergency department.  Return here as needed.

## 2023-05-29 NOTE — ED Provider Notes (Signed)
 [facility]    CSN: [id number] Arrival date & time: 05/29/23  1612      History   Chief Complaint Chief Complaint  Patient presents with   Leg Swelling    HPI Daniel [name] is a 52 y.o. male.   Patient presents with onset of bilateral leg swelling over the last week.  Patient states that he returned to work about 2 weeks ago and then began to notice that he was having leg swelling.  Patient states that he is on his feet most of the day while at work.  Denies any pain to his legs.  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  Patient has history of diabetes, hypertension, obesity, and alcoholic hepatitis.  Patient denies any history of heart failure.  Denies chest pain, shortness of breath, weakness, or difficulty walking.  The history is provided by the patient and medical records.    Past Medical History:  Diagnosis Date   Anxiety    Arthritis    Asthma    Dyspnea    Gout    Hypertension    Pre-diabetes     Patient Active Problem List   Diagnosis Date Noted   Diabetes mellitus (HCC) 03/21/2023   Hypokalemia 07/13/2022   Osteoarthritis of right hip 03/29/2022   Tachycardia 12/02/2021   Osteoarthritis of left hip 12/01/2021   Morbid obesity (HCC) 07/06/2021   Hypomagnesemia 03/03/2018   HTN (hypertension) 03/02/2018   Alcoholic hepatitis without ascites 03/02/2018   Chronic anemia 03/02/2018   Alcohol  abuse 03/01/2018   Tobacco abuse 03/01/2018   Asthma 03/01/2018    Past Surgical History:  Procedure Laterality Date   TOTAL HIP ARTHROPLASTY Left 12/01/2021   Procedure: TOTAL HIP ARTHROPLASTY ANTERIOR APPROACH;  Surgeon: Adonica Hoose, MD;  Location: [facility];  Service: Orthopedics;  Laterality: Left;   TOTAL HIP ARTHROPLASTY Right 03/29/2022   Procedure: TOTAL HIP ARTHROPLASTY ANTERIOR APPROACH;  Surgeon: Adonica Hoose, MD;  Location: [facility];  Service: Orthopedics;  Laterality: Right;  89   WISDOM  TOOTH EXTRACTION     age 89       Home Medications    Prior to Admission medications   Medication Sig Start Date End Date Taking? Authorizing Provider  allopurinol  (ZYLOPRIM ) 300 MG tablet Take 1 tablet (300 mg total) by mouth 2 (two) times daily. 03/21/23  Yes Newlin, Enobong, MD  amLODipine  (NORVASC ) 5 MG tablet Take 1 tablet (5 mg total) by mouth daily. 03/21/23  Yes Newlin, Lavelle Posey, MD  atorvastatin  (LIPITOR) 20 MG tablet Take 1 tablet (20 mg total) by mouth daily. 03/21/23  Yes Newlin, Enobong, MD  carvedilol  (COREG ) 6.25 MG tablet Take 1 tablet (6.25 mg total) by mouth 2 (two) times daily with a meal. 03/21/23  Yes Joaquin Mulberry, MD  metFORMIN  (GLUCOPHAGE -XR) 500 MG 24 hr tablet Take 1 tablet (500 mg total) by mouth daily with breakfast. 05/01/23  Yes Newlin, Enobong, MD  Oxycodone  HCl 10 MG TABS  05/08/22  Yes [provider]  acetaminophen  (TYLENOL ) 500 MG tablet Take 1,000 mg by mouth every 6 (six) hours as needed for mild pain (pain score 1-3) or moderate pain (pain score 4-6).    [provider]  albuterol  (VENTOLIN  HFA) 108 (90 Base) MCG/ACT inhaler INHALE 1 PUFF INTO THE LUNGS EVERY 6 HOURS AS NEEDED FOR WHEEZING OR SHORTNESS OF BREATH. 04/18/23   Newlin, Enobong, MD  Blood Glucose Monitoring Suppl (ACCU-CHEK AVIVA PLUS) w/Device KIT 1  each by Does not apply route daily. 03/21/23   Newlin, Enobong, MD  glucose blood (ACCU-CHEK AVIVA PLUS) test strip Use as instructed tid 03/21/23   Newlin, Enobong, MD  liraglutide  (VICTOZA ) 18 MG/3ML SOPN Inject 0.6 mg into the skin daily. 03/29/23   Newlin, Enobong, MD  methocarbamol  (ROBAXIN ) 500 MG tablet Take 1 tablet (500 mg total) by mouth 2 (two) times daily as needed for muscle spasms. 01/17/23   Early Glisson, MD  naproxen  (NAPROSYN ) 500 MG tablet Take 1 tablet (500 mg total) by mouth 2 (two) times daily with a meal. 01/17/23   Early Glisson, MD  Phentermine -Topiramate  3.75-23 MG CP24 Take 1 capsule by mouth daily for 14 days,  THEN 2 capsules daily. Patient not taking: Reported on 11/30/2022 09/18/22 11/01/22  Newlin, Enobong, MD  triamcinolone  cream (KENALOG ) 0.1 % Apply 1 Application topically 2 (two) times daily. 11/06/22   Newlin, Enobong, MD  colchicine  0.6 MG tablet Take 2 tabs (1.2mg ) at the onset of a Gout flare, may repeat 1 tab (0.6mg ) after 2 hours if symptoms persist 09/30/19 04/12/20  Joaquin Mulberry, MD    Family History Family History  Problem Relation Age of Onset   Heart attack Mother    Colon cancer Neg Hx    Colon polyps Neg Hx    Rectal cancer Neg Hx    Stomach cancer Neg Hx    Esophageal cancer Neg Hx     Social History Social History   Tobacco Use   Smoking status: Every Day    Current packs/day: 0.30    Average packs/day: 0.3 packs/day for 33.0 years (9.9 ttl pk-yrs)    Types: Cigarettes   Smokeless tobacco: Never  Vaping Use   Vaping status: Never Used  Substance Use Topics   Alcohol  use: Never   Drug use: Never     Allergies   Naproxen    Review of Systems Review of Systems   Physical Exam Triage Vital Signs ED Triage Vitals  Encounter Vitals Group     BP 05/29/23 1900 (!) 144/95     Systolic BP Percentile --      Diastolic BP Percentile --      Pulse Rate 05/29/23 1900 77     Resp 05/29/23 1900 18     Temp 05/29/23 1900 98.3 F (36.8 C)     Temp Source 05/29/23 1900 Oral     SpO2 05/29/23 1900 96 %     Weight --      Height --      Head Circumference --      Peak Flow --      Pain Score 05/29/23 1858 0     Pain Loc --      Pain Education --      Exclude from Growth Chart --    No data found.  Updated Vital Signs BP (!) 144/95   Pulse 77   Temp 98.3 F (36.8 C) (Oral)   Resp 18   SpO2 96%   Visual Acuity Right Eye Distance:   Left Eye Distance:   Bilateral Distance:    Right Eye Near:   Left Eye Near:    Bilateral Near:     Physical Exam Vitals and nursing note reviewed.  Constitutional:      General: He is awake. He is not in acute  distress.    Appearance: Normal appearance. He is well-developed and well-groomed. He is not ill-appearing.  Cardiovascular:     Rate and Rhythm: Normal rate  and regular rhythm.     Heart sounds: Normal heart sounds.  Pulmonary:     Effort: Pulmonary effort is normal.     Breath sounds: Normal breath sounds.  Musculoskeletal:     Right lower leg: 2+ Edema present.     Left lower leg: 2+ Edema present.  Skin:    General: Skin is warm and dry.  Neurological:     Mental Status: He is alert.  Psychiatric:        Behavior: Behavior is cooperative.      UC Treatments / Results  Labs (all labs ordered are listed, but only abnormal results are displayed) Labs Reviewed  POCT URINALYSIS DIP (MANUAL ENTRY) - Normal  CBC  COMPREHENSIVE METABOLIC PANEL WITH GFR  TSH    EKG   Radiology No results found.  Procedures Procedures (including critical care time)  Medications Ordered in UC Medications - No data to display  Initial Impression / Assessment and Plan / UC Course  I have reviewed the triage vital signs and the nursing notes.  Pertinent labs & imaging results that were available during my care of the patient were reviewed by me and considered in my medical decision making (see chart for details).     Patient is well-appearing.  Vitals are stable.  Upon assessment +2 bilateral lower leg edema noted without pitting.  Lung and heart sounds normal.  EKG reveals normal sinus rhythm without ST elevation, depression, or acute cardiac findings.  Urinalysis unremarkable.  Ordered CBC, CMP, TSH to rule out underlying causes for bilateral leg edema.  Discussed follow-up, return, and strict ER precautions. Final Clinical Impressions(s) / UC Diagnoses   Final diagnoses:  Bilateral lower extremity edema     Discharge Instructions      Your EKG and urinalysis were overall reassuring today.  We have drawn some lab work to evaluate underlying causes for your leg swelling.  If any  of these results are concerning we will give you a call and advise treatment at that time.   I recommend wearing compression stockings to help with swelling in her legs.  Rest and elevate your legs as often as possible.   Otherwise follow-up with primary care provider for further evaluation of your leg swelling.  If you develop shortness of breath, chest pain, or worsening leg swelling please seek immediate medical treatment in the emergency department.  Return here as needed.     ED Prescriptions   None    PDMP not reviewed this encounter.   Levora Reas A, NP 05/29/23 2028

## 2023-06-04 ENCOUNTER — Other Ambulatory Visit: Payer: Self-pay | Admitting: Family Medicine

## 2023-07-30 ENCOUNTER — Ambulatory Visit: Payer: Self-pay | Admitting: *Deleted

## 2023-07-30 NOTE — Telephone Encounter (Signed)
 FYI Only or Action Required?: FYI only for provider.  Patient was last seen in primary care on 03/21/2023 by Newlin, Enobong, MD. Called Nurse Triage reporting Dizziness. Symptoms began several days ago. Interventions attempted: Nothing. Symptoms are: unchanged.  Triage Disposition: See Physician Within 24 Hours  Patient/caregiver understands and will follow disposition?: Yes/ patient to call back after work                     Copied from [employer] [id number]. Topic: Clinical - Red Word Triage >> Jul 30, 2023 12:39 PM Gustabo D wrote: Patient is complaining of dizziness for the past 2 days. Carolyn Maniscalco patient's wife [phone] Reason for Disposition  Taking a medicine that could cause dizziness (e.g., blood pressure medications, diuretics)  Answer Assessment - Initial Assessment Questions 1. DESCRIPTION: Describe your dizziness.     Patient's wife not sure just dizziness. Wife not with patient now he is at work. 2. LIGHTHEADED: Do you feel lightheaded? (e.g., somewhat faint, woozy, weak upon standing)     Not sure  3. VERTIGO: Do you feel like either you or the room is spinning or tilting? (i.e. vertigo)     na 4. SEVERITY: How bad is it?  Do you feel like you are going to faint? Can you stand and walk?   - MILD: Feels slightly dizzy, but walking normally.   - MODERATE: Feels unsteady when walking, but not falling; interferes with normal activities (e.g., school, work).   - SEVERE: Unable to walk without falling, or requires assistance to walk without falling; feels like passing out now.      Dizziness bending over 5. ONSET:  When did the dizziness begin?     2 days ago  6. AGGRAVATING FACTORS: Does anything make it worse? (e.g., standing, change in head position)     Holds head down gets dizzy  7. HEART RATE: Can you tell me your heart rate? How many beats in 15 seconds?  (Note: not all patients can do this)       na 8. CAUSE: What do you think is  causing the dizziness?     Not sure  9. RECURRENT SYMPTOM: Have you had dizziness before? If Yes, ask: When was the last time? What happened that time?     Yes long time ago  10. OTHER SYMPTOMS: Do you have any other symptoms? (e.g., fever, chest pain, vomiting, diarrhea, bleeding)       No other sx BP 127/87 yesterday  11. PREGNANCY: Is there any chance you are pregnant? When was your last menstrual period?       Na   Patient at work and not with caller now. Recommended for patient 's wife to have patient call back after work to review sx. Recommended if chest pain, nausea , sweating  SOB occur go to ED.  Protocols used: Dizziness - Lightheadedness-A-AH

## 2023-08-02 NOTE — Telephone Encounter (Signed)
 Left message on voicemail to return call.

## 2023-09-14 ENCOUNTER — Telehealth: Payer: Self-pay | Admitting: Family Medicine

## 2023-09-14 NOTE — Telephone Encounter (Signed)
 Pt confirmed appt 8/22

## 2023-09-17 ENCOUNTER — Ambulatory Visit: Payer: 59 | Attending: Family Medicine | Admitting: Family Medicine

## 2023-09-17 ENCOUNTER — Other Ambulatory Visit: Payer: Self-pay

## 2023-09-17 ENCOUNTER — Encounter: Payer: Self-pay | Admitting: Family Medicine

## 2023-09-17 VITALS — BP 120/76 | HR 81 | Ht 69.0 in | Wt 262.4 lb

## 2023-09-17 DIAGNOSIS — E1159 Type 2 diabetes mellitus with other circulatory complications: Secondary | ICD-10-CM

## 2023-09-17 DIAGNOSIS — E669 Obesity, unspecified: Secondary | ICD-10-CM | POA: Diagnosis not present

## 2023-09-17 DIAGNOSIS — I152 Hypertension secondary to endocrine disorders: Secondary | ICD-10-CM

## 2023-09-17 DIAGNOSIS — E1169 Type 2 diabetes mellitus with other specified complication: Secondary | ICD-10-CM

## 2023-09-17 DIAGNOSIS — Z7984 Long term (current) use of oral hypoglycemic drugs: Secondary | ICD-10-CM

## 2023-09-17 DIAGNOSIS — Z79899 Other long term (current) drug therapy: Secondary | ICD-10-CM

## 2023-09-17 DIAGNOSIS — Z23 Encounter for immunization: Secondary | ICD-10-CM | POA: Diagnosis not present

## 2023-09-17 DIAGNOSIS — F17200 Nicotine dependence, unspecified, uncomplicated: Secondary | ICD-10-CM | POA: Diagnosis not present

## 2023-09-17 DIAGNOSIS — Z720 Tobacco use: Secondary | ICD-10-CM

## 2023-09-17 LAB — POCT GLYCOSYLATED HEMOGLOBIN (HGB A1C): HbA1c, POC (controlled diabetic range): 6.4 % (ref 0.0–7.0)

## 2023-09-17 MED ORDER — ACCU-CHEK AVIVA PLUS W/DEVICE KIT
1.0000 | PACK | Freq: Every day | 0 refills | Status: AC
  Filled 2023-09-17 – 2023-10-02 (×2): qty 1, 30d supply, fill #0

## 2023-09-17 MED ORDER — OZEMPIC (0.25 OR 0.5 MG/DOSE) 2 MG/1.5ML ~~LOC~~ SOPN
0.2500 mg | PEN_INJECTOR | SUBCUTANEOUS | 0 refills | Status: AC
Start: 2023-09-17 — End: ?
  Filled 2023-09-17: qty 2, 70d supply, fill #0

## 2023-09-17 MED ORDER — AMLODIPINE BESYLATE 5 MG PO TABS
5.0000 mg | ORAL_TABLET | Freq: Every day | ORAL | 1 refills | Status: DC
  Filled 2023-09-17: qty 90, 90d supply, fill #0
  Filled 2023-10-03: qty 30, 30d supply, fill #0

## 2023-09-17 MED ORDER — ACCU-CHEK AVIVA PLUS VI STRP
ORAL_STRIP | 12 refills | Status: AC
  Filled 2023-09-17: qty 100, 90d supply, fill #0
  Filled 2023-10-02: qty 50, 30d supply, fill #0
  Filled 2023-10-02: qty 50, 50d supply, fill #0
  Filled 2023-10-02: qty 50, 30d supply, fill #0

## 2023-09-17 MED ORDER — ATORVASTATIN CALCIUM 20 MG PO TABS
20.0000 mg | ORAL_TABLET | Freq: Every day | ORAL | 1 refills | Status: AC
  Filled 2023-09-17: qty 90, 90d supply, fill #0
  Filled 2023-12-01: qty 30, 30d supply, fill #0
  Filled 2023-12-28: qty 30, 30d supply, fill #1
  Filled 2024-01-29: qty 30, 30d supply, fill #2
  Filled 2024-02-29: qty 30, 30d supply, fill #3

## 2023-09-17 MED ORDER — CARVEDILOL 6.25 MG PO TABS
6.2500 mg | ORAL_TABLET | Freq: Two times a day (BID) | ORAL | 1 refills | Status: DC
  Filled 2023-09-17: qty 180, 90d supply, fill #0

## 2023-09-17 MED ORDER — SEMAGLUTIDE (1 MG/DOSE) 4 MG/3ML ~~LOC~~ SOPN
1.0000 mg | PEN_INJECTOR | SUBCUTANEOUS | 4 refills | Status: AC
  Filled 2023-09-17 – 2023-11-26 (×2): qty 3, 28d supply, fill #0
  Filled 2023-12-22: qty 3, 28d supply, fill #1
  Filled 2024-01-19: qty 3, 28d supply, fill #2
  Filled 2024-02-14: qty 3, 28d supply, fill #3

## 2023-09-17 MED ORDER — OZEMPIC (0.25 OR 0.5 MG/DOSE) 2 MG/3ML ~~LOC~~ SOPN
0.5000 mg | PEN_INJECTOR | SUBCUTANEOUS | 0 refills | Status: DC
  Filled 2023-09-17 (×2): qty 3, 28d supply, fill #0

## 2023-09-17 NOTE — Addendum Note (Signed)
 Addended by: LONITA FLORETTE GAILS on: 09/17/2023 02:19 PM   Modules accepted: Orders

## 2023-09-17 NOTE — Patient Instructions (Signed)
 VISIT SUMMARY:  During your visit, we discussed your diabetes management, including your current medications and potential changes. We also reviewed your blood pressure and cholesterol levels, and addressed your smoking habits. Additionally, we arranged for a glucose meter prescription and an eye exam referral.  YOUR PLAN:  -TYPE 2 DIABETES MELLITUS: Type 2 diabetes is a condition where your body does not use insulin  properly, leading to high blood sugar levels. Your A1c has improved to 6.4%, but metformin  has been poorly tolerated. We plan to transition you to Ozempic  for better blood sugar control and weight loss, pending insurance approval. In the meantime, continue taking metformin . We will also provide you with a blood glucose meter for home monitoring and have placed a referral for an eye exam. Additionally, you received a pneumonia vaccination today.  -HYPERTENSION: Hypertension, or high blood pressure, is well-controlled with your current medications, amlodipine  and carvedilol . Continue taking these medications as prescribed.  -HYPERLIPIDEMIA: Hyperlipidemia is a condition with high levels of fats in your blood, which can increase the risk of heart disease. It is managed with atorvastatin , which you should continue taking indefinitely.  -TOBACCO USE: You have expressed interest in quitting smoking but are not ready for medication or nicotine  replacement therapy. We discussed smoking cessation options and will revisit this in the future.  INSTRUCTIONS:  We will send a message to the pharmacist to check your insurance coverage for Ozempic . Continue taking metformin  until Ozempic  is approved and started. We have ordered a blood glucose meter for you to monitor your blood sugar levels at home and placed a referral for an eye exam. Please continue taking your blood pressure and cholesterol medications as prescribed. If you have any questions or concerns, please contact our office.

## 2023-09-17 NOTE — Progress Notes (Signed)
 Subjective:  Patient ID: Daniel [name], male    DOB: [birth year]  Age: 53 y.o. MRN: [id number]  CC: Medical Management of Chronic Issues (Discuss metformin /)     Discussed the use of AI scribe software for clinical note transcription with the patient, who gave verbal consent to proceed.  History of Present Illness Daniel [name] is a 53 year old male with diabetes who presents with concerns about metformin  and medication management.  He takes metformin  500 mg once daily, which initially caused stomach pain when taken in the morning but improved with nighttime dosing. Metformin  has not improved his appetite. Previously, he was on Ozempic , which aided in weight loss, but insurance did not cover it, leading to a switch to metformin . Victoza  was also not covered by insurance.  He takes amlodipine  and carvedilol  for blood pressure and atorvastatin  for cholesterol, with confirmed adherence. His last cholesterol check in February was normal. His A1c improved from 6.7% to 6.4%.  He requests a glucose meter prescription to monitor blood sugar levels. He continues to smoke.  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 He requests a referral for an eye exam.    Past Medical History:  Diagnosis Date   Anxiety    Arthritis    Asthma    Dyspnea    Gout    Hypertension    Pre-diabetes     Past Surgical History:  Procedure Laterality Date   TOTAL HIP ARTHROPLASTY Left 12/01/2021   Procedure: TOTAL HIP ARTHROPLASTY ANTERIOR APPROACH;  Surgeon: Fidel Rogue, MD;  Location: [facility];  Service: Orthopedics;  Laterality: Left;   TOTAL HIP ARTHROPLASTY Right 03/29/2022   Procedure: TOTAL HIP ARTHROPLASTY ANTERIOR APPROACH;  Surgeon: Fidel Rogue, MD;  Location: [facility];  Service: Orthopedics;  Laterality: Right;  120   WISDOM TOOTH EXTRACTION     age 67    Family History  Problem Relation Age of Onset    Heart attack Mother    Colon cancer Neg Hx    Colon polyps Neg Hx    Rectal cancer Neg Hx    Stomach cancer Neg Hx    Esophageal cancer Neg Hx     Social History   Socioeconomic History   Marital status: Divorced    Spouse name: Not on file   Number of children: Not on file   Years of education: Not on file   Highest education level: Not on file  Occupational History   Not on file  Tobacco Use   Smoking status: Every Day    Current packs/day: 0.30    Average packs/day: 0.3 packs/day for 33.0 years (9.9 ttl pk-yrs)    Types: Cigarettes   Smokeless tobacco: Never  Vaping Use   Vaping status: Never Used  Substance and Sexual Activity   Alcohol  use: Never   Drug use: Never   Sexual activity: Yes    Birth control/protection: None    Comment: Married  Other Topics Concern   Not on file  Social History Narrative   ** Merged History Encounter **       Social Drivers of Health   Financial Resource Strain: Low Risk  (11/07/2022)   Overall Financial Resource Strain (CARDIA)    Difficulty of Paying Living Expenses: Not hard at all  Food Insecurity: No Food Insecurity (11/07/2022)   Hunger Vital Sign    Worried About Running Out of Food in the Last Year: Never  true    Ran Out of Food in the Last Year: Never true  Transportation Needs: No Transportation Needs (11/07/2022)   PRAPARE - Administrator, Civil Service (Medical): No    Lack of Transportation (Non-Medical): No  Physical Activity: Insufficiently Active (11/07/2022)   Exercise Vital Sign    Days of Exercise per Week: 2 days    Minutes of Exercise per Session: 60 min  Stress: No Stress Concern Present (11/07/2022)   [employer] of Occupational Health - Occupational Stress Questionnaire    Feeling of Stress : Not at all  Social Connections: Unknown (11/07/2022)   Social Connection and Isolation Panel    Frequency of Communication with Friends and Family: Three times a week    Frequency of Social  Gatherings with Friends and Family: Three times a week    Attends Religious Services: Patient declined    Active Member of Clubs or Organizations: No    Attends Banker Meetings: Never    Marital Status: Married    Allergies  Allergen Reactions   Naproxen  Nausea Only    Outpatient Medications Prior to Visit  Medication Sig Dispense Refill   acetaminophen  (TYLENOL ) 500 MG tablet Take 1,000 mg by mouth every 6 (six) hours as needed for mild pain (pain score 1-3) or moderate pain (pain score 4-6).     albuterol  (VENTOLIN  HFA) 108 (90 Base) MCG/ACT inhaler INHALE 1 PUFF INTO THE LUNGS EVERY 6 HOURS AS NEEDED FOR WHEEZING OR SHORTNESS OF BREATH. 18 each 3   allopurinol  (ZYLOPRIM ) 300 MG tablet Take 1 tablet (300 mg total) by mouth 2 (two) times daily. 180 tablet 1   metFORMIN  (GLUCOPHAGE -XR) 500 MG 24 hr tablet TAKE 1 TABLET BY MOUTH EVERY DAY WITH BREAKFAST 90 tablet 1   methocarbamol  (ROBAXIN ) 500 MG tablet Take 1 tablet (500 mg total) by mouth 2 (two) times daily as needed for muscle spasms. 20 tablet 0   naproxen  (NAPROSYN ) 500 MG tablet Take 1 tablet (500 mg total) by mouth 2 (two) times daily with a meal. 30 tablet 0   Oxycodone  HCl 10 MG TABS      triamcinolone  cream (KENALOG ) 0.1 % Apply 1 Application topically 2 (two) times daily. 80 g 1   amLODipine  (NORVASC ) 5 MG tablet Take 1 tablet (5 mg total) by mouth daily. 90 tablet 1   atorvastatin  (LIPITOR) 20 MG tablet Take 1 tablet (20 mg total) by mouth daily. 90 tablet 1   Blood Glucose Monitoring Suppl (ACCU-CHEK AVIVA PLUS) w/Device KIT 1 each by Does not apply route daily. 1 kit 0   carvedilol  (COREG ) 6.25 MG tablet Take 1 tablet (6.25 mg total) by mouth 2 (two) times daily with a meal. 180 tablet 1   glucose blood (ACCU-CHEK AVIVA PLUS) test strip Use as instructed tid 100 each 12   Phentermine -Topiramate  3.75-23 MG CP24 Take 1 capsule by mouth daily for 14 days, THEN 2 capsules daily. (Patient not taking: No sig  reported) 74 capsule 3   liraglutide  (VICTOZA ) 18 MG/3ML SOPN Inject 0.6 mg into the skin daily. (Patient not taking: Reported on 09/17/2023) 3 mL 0   No facility-administered medications prior to visit.     ROS Review of Systems  Constitutional:  Negative for activity change and appetite change.  HENT:  Negative for sinus pressure and sore throat.   Respiratory:  Negative for chest tightness, shortness of breath and wheezing.   Cardiovascular:  Negative for chest pain and palpitations.  Gastrointestinal:  Negative for abdominal distention, abdominal pain and constipation.  Genitourinary: Negative.   Musculoskeletal: Negative.   Psychiatric/Behavioral:  Negative for behavioral problems and dysphoric mood.     Objective:  BP 120/76   Pulse 81   Ht 5' 9 (1.753 m)   Wt 262 lb 6.4 oz (119 kg)   SpO2 98%   BMI 38.75 kg/m      09/17/2023    1:46 PM 05/29/2023    7:00 PM 03/21/2023    3:18 PM  BP/Weight  Systolic BP 120 144 136  Diastolic BP 76 95 89  Wt. (Lbs) 262.4  279.4  BMI 38.75 kg/m2  41.26 kg/m2      Physical Exam Constitutional:      Appearance: He is well-developed.  Cardiovascular:     Rate and Rhythm: Normal rate.     Heart sounds: Normal heart sounds. No murmur heard. Pulmonary:     Effort: Pulmonary effort is normal.     Breath sounds: Normal breath sounds. No wheezing or rales.  Chest:     Chest wall: No tenderness.  Abdominal:     General: Bowel sounds are normal. There is no distension.     Palpations: Abdomen is soft. There is no mass.     Tenderness: There is no abdominal tenderness.  Musculoskeletal:        General: Normal range of motion.     Right lower leg: No edema.     Left lower leg: No edema.  Neurological:     Mental Status: He is alert and oriented to person, place, and time.  Psychiatric:        Mood and Affect: Mood normal.        Latest Ref Rng & Units 05/29/2023    8:10 PM 03/22/2023    8:51 AM 07/13/2022   10:30 AM  CMP   Glucose 70 - 99 mg/dL 98  811  896   BUN 6 - 20 mg/dL 6  11  8    Creatinine 0.61 - 1.24 mg/dL 9.13  9.08  9.13   Sodium 135 - 145 mmol/L 137  138  139   Potassium 3.5 - 5.1 mmol/L 4.1  4.4  4.1   Chloride 98 - 111 mmol/L 101  101  100   CO2 22 - 32 mmol/L 29  24  26    Calcium  8.9 - 10.3 mg/dL 9.0  9.3  9.8   Total Protein 6.5 - 8.1 g/dL 6.2  7.2    Total Bilirubin 0.0 - 1.2 mg/dL 0.6  0.3    Alkaline Phos 38 - 126 U/L 46  89    AST 15 - 41 U/L 21  16    ALT 0 - 44 U/L 13  10      Lipid Panel     Component Value Date/Time   CHOL 153 03/22/2023 0851   TRIG 161 (H) 03/22/2023 0851   HDL 37 (L) 03/22/2023 0851   CHOLHDL 3.8 08/11/2020 1056   LDLCALC 88 03/22/2023 0851    CBC    Component Value Date/Time   WBC 9.8 05/29/2023 2010   RBC 4.62 05/29/2023 2010   HGB 14.1 05/29/2023 2010   HGB 15.0 05/28/2019 1622   HCT 42.3 05/29/2023 2010   HCT 43.2 05/28/2019 1622   PLT 326 05/29/2023 2010   PLT 306 05/28/2019 1622   MCV 91.6 05/29/2023 2010   MCV 96 05/28/2019 1622   MCH 30.5 05/29/2023 2010   MCHC 33.3 05/29/2023 2010  RDW 15.0 05/29/2023 2010   RDW 15.5 (H) 05/28/2019 1622   LYMPHSABS 1.8 05/28/2019 1622   MONOABS 0.9 11/24/2018 1242   EOSABS 0.1 05/28/2019 1622   BASOSABS 0.1 05/28/2019 1622    Lab Results  Component Value Date   HGBA1C 6.4 09/17/2023    Lab Results  Component Value Date   HGBA1C 6.4 09/17/2023   HGBA1C 6.7 (A) 03/21/2023   HGBA1C 5.7 (H) 03/17/2022       Assessment & Plan Type 2 diabetes mellitus A1c improved to 6.4%. Metformin  poorly tolerated. Transition to Ozempic  planned for better glycemic control and weight loss, pending insurance approval. - Send message to pharmacist to check insurance coverage for Ozempic . - Prescribe Ozempic  0.25 mg once a week for four weeks, then increase dose every 4 weeks to 1 mg. - Advised to contact me if he develops hypoglycemia - Continue metformin  until Ozempic  is approved and started then  discontinue metformin  - Order blood glucose meter for home monitoring. - Place referral for eye exam. - Administer pneumonia vaccination.   Moderate obesity Initiation of GLP-1 RA will be beneficial Continue to work on caloric restriction   Hypertension associated with type 2 diabetes mellitus Well-controlled with amlodipine  and carvedilol . - Continue amlodipine  and carvedilol . -Counseled on blood pressure goal of less than 130/80, low-sodium, DASH diet, medication compliance, 150 minutes of moderate intensity exercise per week. Discussed medication compliance, adverse effects.   Hyperlipidemia associated with type 2 diabetes mellitus Managed with atorvastatin  for cardiovascular protection. -Normal lipid panel - Continue atorvastatin  indefinitely.  Tobacco use Expressed interest in quitting but not ready for pharmacotherapy or nicotine  replacement. - Discuss smoking cessation options, defer pharmacotherapy or nicotine  replacement therapy.     Meds ordered this encounter  Medications   Blood Glucose Monitoring Suppl (ACCU-CHEK AVIVA PLUS) w/Device KIT    Sig: 1 each by Does not apply route daily.    Dispense:  1 kit    Refill:  0   glucose blood (ACCU-CHEK AVIVA PLUS) test strip    Sig: Use as instructed daily    Dispense:  100 each    Refill:  12   amLODipine  (NORVASC ) 5 MG tablet    Sig: Take 1 tablet (5 mg total) by mouth daily.    Dispense:  90 tablet    Refill:  1   atorvastatin  (LIPITOR) 20 MG tablet    Sig: Take 1 tablet (20 mg total) by mouth daily.    Dispense:  90 tablet    Refill:  1   carvedilol  (COREG ) 6.25 MG tablet    Sig: Take 1 tablet (6.25 mg total) by mouth 2 (two) times daily with a meal.    Dispense:  180 tablet    Refill:  1   Semaglutide ,0.25 or 0.5MG /DOS, (OZEMPIC , 0.25 OR 0.5 MG/DOSE,) 2 MG/1.5ML SOPN    Sig: Inject 0.25 mg into the skin once a week. For 4 weeks then increase to 0.5mg     Dispense:  2 mL    Refill:  0   Semaglutide ,0.25  or 0.5MG /DOS, (OZEMPIC , 0.25 OR 0.5 MG/DOSE,) 2 MG/3ML SOPN    Sig: Inject 0.5 mg into the skin once a week. For 4 weeks then increase to 1mg     Dispense:  3 mL    Refill:  0   Semaglutide , 1 MG/DOSE, 4 MG/3ML SOPN    Sig: Inject 1 mg as directed once a week.    Dispense:  3 mL    Refill:  4  Follow-up: Return in about 6 months (around 03/19/2024) for Chronic medical conditions.       Corrina Sabin, MD, FAAFP. [facility] [place], [place] [phone]   09/17/2023, 2:11 PM

## 2023-09-19 ENCOUNTER — Other Ambulatory Visit: Payer: Self-pay

## 2023-09-28 ENCOUNTER — Other Ambulatory Visit: Payer: Self-pay

## 2023-09-30 ENCOUNTER — Other Ambulatory Visit: Payer: Self-pay

## 2023-10-02 ENCOUNTER — Other Ambulatory Visit: Payer: Self-pay | Admitting: Family Medicine

## 2023-10-02 ENCOUNTER — Telehealth: Payer: Self-pay

## 2023-10-02 ENCOUNTER — Telehealth: Payer: Self-pay | Admitting: Family Medicine

## 2023-10-02 ENCOUNTER — Other Ambulatory Visit: Payer: Self-pay

## 2023-10-02 MED ORDER — ACCU-CHEK SOFTCLIX LANCETS MISC
3 refills | Status: AC
  Filled 2023-10-02: qty 100, 30d supply, fill #0

## 2023-10-02 NOTE — Telephone Encounter (Signed)
Pt confirmed appt 9/10

## 2023-10-02 NOTE — Telephone Encounter (Signed)
 Copied from CRM [id number]. Topic: Referral - Status >> Oct 02, 2023  2:09 PM Antwanette L wrote: Reason for CRM: Pt wife (sonia) called to get an update on his ophthalmology referral ([id number]). According to Epic, the referral is closed. Please resubmit another referral.

## 2023-10-02 NOTE — Telephone Encounter (Signed)
 Copied from CRM [id number]. Topic: Clinical - Medication Prior Auth >> Oct 02, 2023  2:13 PM Antwanette L wrote: Reason for CRM: Pt wife (sonia) called bc [facility] at [facility] needs a prior authorization for Ozempic . >> Oct 02, 2023  2:18 PM Antwanette L wrote: [facility]([address]) is requesting the prior authorization for Ozempic 

## 2023-10-03 ENCOUNTER — Telehealth: Payer: Self-pay

## 2023-10-03 ENCOUNTER — Other Ambulatory Visit: Payer: Self-pay

## 2023-10-03 NOTE — Telephone Encounter (Signed)
 Have you seen a PA for patient.

## 2023-10-03 NOTE — Telephone Encounter (Signed)
 Pharmacy Patient Advocate Encounter  Received notification from [employer] that Prior Authorization for OZEMPIC  has been APPROVED from 10/02/2023 to 10/01/2024   PA #/Case ID/Reference #: [id number]

## 2023-10-04 ENCOUNTER — Other Ambulatory Visit: Payer: Self-pay

## 2023-10-06 ENCOUNTER — Other Ambulatory Visit: Payer: Self-pay | Admitting: Family Medicine

## 2023-10-06 DIAGNOSIS — I152 Hypertension secondary to endocrine disorders: Secondary | ICD-10-CM

## 2023-10-08 NOTE — Telephone Encounter (Signed)
 Change of pharmacy  Requested Prescriptions  Pending Prescriptions Disp Refills   amLODipine  (NORVASC ) 5 MG tablet [Pharmacy Med Name: AMLODIPINE  BESYLATE 5 MG TAB] 90 tablet 1    Sig: TAKE 1 TABLET (5 MG TOTAL) BY MOUTH DAILY.     Cardiovascular: Calcium  Channel Blockers 2 Passed - 10/08/2023  3:15 PM      Passed - Last BP in normal range    BP Readings from Last 1 Encounters:  09/17/23 120/76         Passed - Last Heart Rate in normal range    Pulse Readings from Last 1 Encounters:  09/17/23 81         Passed - Valid encounter within last 6 months    Recent Outpatient Visits           3 weeks ago Type 2 diabetes mellitus with other specified complication, without long-term current use of insulin  (HCC)   [facility] [place]. [facility] Delbert Clam, MD   6 months ago Type 2 diabetes mellitus with other specified complication, without long-term current use of insulin  [facility])   [facility] [place]. [facility] Delbert Clam, MD   11 months ago Other eczema   [facility] [place]. [facility] Delbert Clam, MD   1 year ago Morbid obesity [facility])   [facility] [place]. [facility] Delbert Clam, MD   1 year ago Primary hypertension    [facility] . [facility] Brien Belvie BRAVO, MD       Future Appointments             In 5 months Delbert Clam, MD [facility] [place]. [facility] [name]

## 2023-10-12 ENCOUNTER — Other Ambulatory Visit: Payer: Self-pay

## 2023-10-13 ENCOUNTER — Ambulatory Visit (HOSPITAL_COMMUNITY)
Admission: EM | Admit: 2023-10-13 | Discharge: 2023-10-13 | Disposition: A | Discharge status: Home / Self Care | Attending: Internal Medicine | Admitting: Internal Medicine

## 2023-10-13 ENCOUNTER — Encounter (HOSPITAL_COMMUNITY): Payer: Self-pay

## 2023-10-13 DIAGNOSIS — K611 Rectal abscess: Secondary | ICD-10-CM

## 2023-10-13 MED ORDER — AMOXICILLIN-POT CLAVULANATE 875-125 MG PO TABS: 1.0000 | ORAL_TABLET | Freq: Two times a day (BID) | ORAL | 0 refills | Status: AC

## 2023-10-13 MED ORDER — MUPIROCIN 2 % EX OINT: 1.0000 | TOPICAL_OINTMENT | Freq: Two times a day (BID) | CUTANEOUS | 1 refills | Status: AC

## 2023-10-13 NOTE — ED Triage Notes (Signed)
 Patient report that last night an abscess to the left upper/ inner buttocks ruptured and he noticed the drainage was brownish with blood, with an odor. The patient is wanting to have the area examined.

## 2023-10-13 NOTE — Discharge Instructions (Addendum)
 Peri-rectal abscess of the left midline. Already draining adequately so no incision and drainage done today. Will treat with antibiotics by mouth and topical to ensure completely resolution. We will treat with the following:  Augmentin  875/125 mg twice daily for 7 days.  This is an antibiotic.  Take this with food. Mupirocin  ointment 2-3 times daily to the affected area until healed.  Sitz bath after every bowel movement if possible.  Clean the area well after every bowel movement.  Return to urgent care or PCP if symptoms worsen or fail to resolve.

## 2023-10-13 NOTE — ED Provider Notes (Signed)
 [facility]    CSN: [id number] Arrival date & time: 10/13/23  1008      History   Chief Complaint Chief Complaint  Patient presents with   Abscess    HPI Daniel [name] is a 53 y.o. male.   53 year old male presents urgent care with complaints of an abscess near his rectum.  He reports that this morning he woke up with drainage and blood from the area.  His wife put some Neosporin on the area.  He does have some pain with sitting.  The area is continuing to drain.  He denies any fevers or chills.  He has had no history of this in the past.   Abscess Associated symptoms: no fever and no vomiting     Past Medical History:  Diagnosis Date   Anxiety    Arthritis    Asthma    Dyspnea    Gout    Hypertension    Pre-diabetes     Patient Active Problem List   Diagnosis Date Noted   Diabetes mellitus (HCC) 03/21/2023   Hypokalemia 07/13/2022   Osteoarthritis of right hip 03/29/2022   Tachycardia 12/02/2021   Osteoarthritis of left hip 12/01/2021   Morbid obesity (HCC) 07/06/2021   Hypomagnesemia 03/03/2018   HTN (hypertension) 03/02/2018   Alcoholic hepatitis without ascites 03/02/2018   Chronic anemia 03/02/2018   Alcohol  abuse 03/01/2018   Tobacco abuse 03/01/2018   Asthma 03/01/2018    Past Surgical History:  Procedure Laterality Date   TOTAL HIP ARTHROPLASTY Left 12/01/2021   Procedure: TOTAL HIP ARTHROPLASTY ANTERIOR APPROACH;  Surgeon: Fidel Rogue, MD;  Location: [facility];  Service: Orthopedics;  Laterality: Left;   TOTAL HIP ARTHROPLASTY Right 03/29/2022   Procedure: TOTAL HIP ARTHROPLASTY ANTERIOR APPROACH;  Surgeon: Fidel Rogue, MD;  Location: [facility];  Service: Orthopedics;  Laterality: Right;  120   WISDOM TOOTH EXTRACTION     age 10       Home Medications    Prior to Admission medications   Medication Sig Start Date End Date Taking? Authorizing Provider  amoxicillin -clavulanate (AUGMENTIN ) 875-125 MG tablet Take 1 tablet by  mouth every 12 (twelve) hours. 10/13/23  Yes Kamal Jurgens A, PA-C  mupirocin  ointment (BACTROBAN ) 2 % Apply 1 Application topically 2 (two) times daily. 10/13/23  Yes Amyria Komar A, PA-C  Accu-Chek Softclix Lancets lancets Use as instructed daily before meals 10/02/23   Newlin, Enobong, MD  acetaminophen  (TYLENOL ) 500 MG tablet Take 1,000 mg by mouth every 6 (six) hours as needed for mild pain (pain score 1-3) or moderate pain (pain score 4-6).    [provider]  albuterol  (VENTOLIN  HFA) 108 (90 Base) MCG/ACT inhaler INHALE 1 PUFF INTO THE LUNGS EVERY 6 HOURS AS NEEDED FOR WHEEZING OR SHORTNESS OF BREATH. 04/18/23   Newlin, Enobong, MD  allopurinol  (ZYLOPRIM ) 300 MG tablet Take 1 tablet (300 mg total) by mouth 2 (two) times daily. 03/21/23   Newlin, Enobong, MD  amLODipine  (NORVASC ) 5 MG tablet TAKE 1 TABLET (5 MG TOTAL) BY MOUTH DAILY. 10/08/23   Newlin, Enobong, MD  atorvastatin  (LIPITOR) 20 MG tablet Take 1 tablet (20 mg total) by mouth daily. 09/17/23   Newlin, Enobong, MD  Blood Glucose Monitoring Suppl (ACCU-CHEK AVIVA PLUS) w/Device KIT Use as directed 09/17/23   Newlin, Enobong, MD  carvedilol  (COREG ) 6.25 MG tablet Take 1 tablet (6.25 mg total) by mouth 2 (two) times daily with a meal. 09/17/23   Delbert Clam, MD  glucose blood (  ACCU-CHEK AVIVA PLUS) test strip Use as instructed daily 09/17/23   Newlin, Enobong, MD  metFORMIN  (GLUCOPHAGE -XR) 500 MG 24 hr tablet TAKE 1 TABLET BY MOUTH EVERY DAY WITH BREAKFAST 06/04/23   Newlin, Enobong, MD  methocarbamol  (ROBAXIN ) 500 MG tablet Take 1 tablet (500 mg total) by mouth 2 (two) times daily as needed for muscle spasms. 01/17/23   Cleotilde Rogue, MD  naproxen  (NAPROSYN ) 500 MG tablet Take 1 tablet (500 mg total) by mouth 2 (two) times daily with a meal. 01/17/23   Cleotilde Rogue, MD  Oxycodone  HCl 10 MG TABS  05/08/22   [provider]  Phentermine -Topiramate  3.75-23 MG CP24 Take 1 capsule by mouth daily for 14 days, THEN 2 capsules  daily. Patient not taking: No sig reported 09/18/22 11/01/22  Delbert Clam, MD  Semaglutide , 1 MG/DOSE, 4 MG/3ML SOPN Inject 1 mg as directed once a week. 09/17/23   Newlin, Enobong, MD  Semaglutide ,0.25 or 0.5MG /DOS, (OZEMPIC , 0.25 OR 0.5 MG/DOSE,) 2 MG/1.5ML SOPN Inject 0.25 mg into the skin once a week. For 4 weeks then increase to 0.5mg  09/17/23   Newlin, Enobong, MD  Semaglutide ,0.25 or 0.5MG /DOS, (OZEMPIC , 0.25 OR 0.5 MG/DOSE,) 2 MG/3ML SOPN Inject 0.5 mg into the skin once a week. For 4 weeks then increase to 1mg  09/17/23   Newlin, Enobong, MD  triamcinolone  cream (KENALOG ) 0.1 % Apply 1 Application topically 2 (two) times daily. 11/06/22   Newlin, Enobong, MD  colchicine  0.6 MG tablet Take 2 tabs (1.2mg ) at the onset of a Gout flare, may repeat 1 tab (0.6mg ) after 2 hours if symptoms persist 09/30/19 04/12/20  Delbert Clam, MD    Family History Family History  Problem Relation Age of Onset   Heart attack Mother    Colon cancer Neg Hx    Colon polyps Neg Hx    Rectal cancer Neg Hx    Stomach cancer Neg Hx    Esophageal cancer Neg Hx     Social History Social History   Tobacco Use   Smoking status: Every Day    Current packs/day: 0.30    Average packs/day: 0.3 packs/day for 33.0 years (9.9 ttl pk-yrs)    Types: Cigarettes   Smokeless tobacco: Never  Vaping Use   Vaping status: Never Used  Substance Use Topics   Alcohol  use: Never   Drug use: Never     Allergies   Naproxen    Review of Systems Review of Systems  Constitutional:  Negative for chills and fever.  HENT:  Negative for ear pain and sore throat.   Eyes:  Negative for pain and visual disturbance.  Respiratory:  Negative for cough and shortness of breath.   Cardiovascular:  Negative for chest pain and palpitations.  Gastrointestinal:  Positive for rectal pain. Negative for abdominal pain and vomiting.       Purulent drainage from left buttocks  Genitourinary:  Negative for dysuria and hematuria.   Musculoskeletal:  Negative for arthralgias and back pain.  Skin:  Negative for color change and rash.  Neurological:  Negative for seizures and syncope.  All other systems reviewed and are negative.    Physical Exam Triage Vital Signs ED Triage Vitals  Encounter Vitals Group     BP 10/13/23 1037 131/88     Girls Systolic BP Percentile --      Girls Diastolic BP Percentile --      Boys Systolic BP Percentile --      Boys Diastolic BP Percentile --  Pulse Rate 10/13/23 1037 90     Resp 10/13/23 1037 18     Temp 10/13/23 1037 98 F (36.7 C)     Temp Source 10/13/23 1037 Oral     SpO2 10/13/23 1037 94 %     Weight --      Height --      Head Circumference --      Peak Flow --      Pain Score 10/13/23 1035 8     Pain Loc --      Pain Education --      Exclude from Growth Chart --    No data found.  Updated Vital Signs BP 131/88 (BP Location: Left Arm)   Pulse 90   Temp 98 F (36.7 C) (Oral)   Resp 18   SpO2 94%   Visual Acuity Right Eye Distance:   Left Eye Distance:   Bilateral Distance:    Right Eye Near:   Left Eye Near:    Bilateral Near:     Physical Exam Vitals and nursing note reviewed.  Constitutional:      General: He is not in acute distress.    Appearance: He is well-developed.  HENT:     Head: Normocephalic and atraumatic.  Eyes:     Conjunctiva/sclera: Conjunctivae normal.  Cardiovascular:     Rate and Rhythm: Normal rate and regular rhythm.     Heart sounds: No murmur heard. Pulmonary:     Effort: Pulmonary effort is normal. No respiratory distress.     Breath sounds: Normal breath sounds.  Abdominal:     Palpations: Abdomen is soft.     Tenderness: There is no abdominal tenderness.  Genitourinary:  Musculoskeletal:        General: No swelling.     Cervical back: Neck supple.  Skin:    General: Skin is warm and dry.     Capillary Refill: Capillary refill takes less than 2 seconds.  Neurological:     Mental Status: He is  alert.  Psychiatric:        Mood and Affect: Mood normal.      UC Treatments / Results  Labs (all labs ordered are listed, but only abnormal results are displayed) Labs Reviewed - No data to display  EKG   Radiology No results found.  Procedures Procedures (including critical care time)  Medications Ordered in UC Medications - No data to display  Initial Impression / Assessment and Plan / UC Course  I have reviewed the triage vital signs and the nursing notes.  Pertinent labs & imaging results that were available during my care of the patient were reviewed by me and considered in my medical decision making (see chart for details).     Perirectal abscess   Peri-rectal abscess of the left midline. Already draining adequately so no incision and drainage done today. Will treat with antibiotics by mouth and topical to ensure completely resolution. We will treat with the following:  Augmentin  875/125 mg twice daily for 7 days.  This is an antibiotic.  Take this with food. Mupirocin  ointment 2-3 times daily to the affected area until healed.  Sitz bath after every bowel movement if possible.  Clean the area well after every bowel movement.  Return to urgent care or PCP if symptoms worsen or fail to resolve.    Final Clinical Impressions(s) / UC Diagnoses   Final diagnoses:  Perirectal abscess     Discharge Instructions  Peri-rectal abscess of the left midline. Already draining adequately so no incision and drainage done today. Will treat with antibiotics by mouth and topical to ensure completely resolution. We will treat with the following:  Augmentin  875/125 mg twice daily for 7 days.  This is an antibiotic.  Take this with food. Mupirocin  ointment 2-3 times daily to the affected area until healed.  Sitz bath after every bowel movement if possible.  Clean the area well after every bowel movement.  Return to urgent care or PCP if symptoms worsen or fail to resolve.        ED Prescriptions     Medication Sig Dispense Auth. Provider   amoxicillin -clavulanate (AUGMENTIN ) 875-125 MG tablet Take 1 tablet by mouth every 12 (twelve) hours. 14 tablet Amadu Schlageter A, PA-C   mupirocin  ointment (BACTROBAN ) 2 % Apply 1 Application topically 2 (two) times daily. 22 g Teresa Almarie LABOR, [place]      PDMP not reviewed this encounter.   Teresa Almarie LABOR, PA-C 10/13/23 1223

## 2023-10-14 ENCOUNTER — Other Ambulatory Visit: Payer: Self-pay | Admitting: Family Medicine

## 2023-10-14 DIAGNOSIS — E1159 Type 2 diabetes mellitus with other circulatory complications: Secondary | ICD-10-CM

## 2023-10-20 ENCOUNTER — Other Ambulatory Visit: Payer: Self-pay | Admitting: Family Medicine

## 2023-10-20 DIAGNOSIS — M1A00X0 Idiopathic chronic gout, unspecified site, without tophus (tophi): Secondary | ICD-10-CM

## 2023-10-22 ENCOUNTER — Ambulatory Visit: Attending: Family Medicine

## 2023-10-22 ENCOUNTER — Other Ambulatory Visit: Payer: Self-pay

## 2023-10-22 ENCOUNTER — Other Ambulatory Visit: Payer: Self-pay | Admitting: Family Medicine

## 2023-10-22 DIAGNOSIS — Z23 Encounter for immunization: Secondary | ICD-10-CM

## 2023-10-22 DIAGNOSIS — M1A00X0 Idiopathic chronic gout, unspecified site, without tophus (tophi): Secondary | ICD-10-CM

## 2023-10-22 NOTE — Progress Notes (Signed)
Flu vaccine administered in left deltoid per protocols.  Information sheet given. Patient denies and pain or discomfort at injection site. Tolerated injection well no reaction.

## 2023-10-25 ENCOUNTER — Other Ambulatory Visit: Payer: Self-pay

## 2023-10-29 ENCOUNTER — Other Ambulatory Visit: Payer: Self-pay | Admitting: Family Medicine

## 2023-10-29 ENCOUNTER — Other Ambulatory Visit: Payer: Self-pay

## 2023-10-29 DIAGNOSIS — I152 Hypertension secondary to endocrine disorders: Secondary | ICD-10-CM

## 2023-10-29 MED ORDER — OZEMPIC (0.25 OR 0.5 MG/DOSE) 2 MG/3ML ~~LOC~~ SOPN
0.5000 mg | PEN_INJECTOR | SUBCUTANEOUS | 0 refills | Status: AC
  Filled 2023-10-29: qty 3, 28d supply, fill #0

## 2023-10-30 ENCOUNTER — Other Ambulatory Visit: Payer: Self-pay | Admitting: Family Medicine

## 2023-10-30 DIAGNOSIS — E1159 Type 2 diabetes mellitus with other circulatory complications: Secondary | ICD-10-CM

## 2023-10-30 MED ORDER — CARVEDILOL 6.25 MG PO TABS: 6.2500 mg | ORAL_TABLET | Freq: Two times a day (BID) | ORAL | 1 refills | Status: AC

## 2023-10-30 NOTE — Telephone Encounter (Signed)
 Copied from CRM [id number]. Topic: Clinical - Medication Question >> Oct 30, 2023 12:13 PM Antwanette L wrote: Reason for CRM: Bartholome, the patient's wife, called to request a refill for Carvedilol  (Coreg ) 6.25 mg tablet. The patient has run out of the medication. He uses [facility] located at [address] [place], [place], [place] [address]. Bartholome can be contacted at  [phone]

## 2023-10-31 ENCOUNTER — Telehealth: Payer: Self-pay

## 2023-10-31 ENCOUNTER — Other Ambulatory Visit: Payer: Self-pay

## 2023-10-31 NOTE — Telephone Encounter (Signed)
 Burnard or Herlene can you assist with this? Pt last seen by Newlin 09/17/23 and has dx of DM and morbid obesity with high ASCVD score.  The 10-year ASCVD risk score (Arnett DK, et al., 2019) is: 28.6%   Values used to calculate the score:     Age: 54 years     Clincally relevant sex: Male     Is Non-Hispanic African American: Yes     Diabetic: Yes     Tobacco smoker: Yes     Systolic Blood Pressure: 131 mmHg     Is BP treated: Yes     HDL Cholesterol: 37 mg/dL     Total Cholesterol: 153 mg/dL

## 2023-10-31 NOTE — Telephone Encounter (Signed)
 Copied from CRM #[id number]. Topic: Clinical - Prescription Issue >> Oct 30, 2023 12:08 PM Antwanette L wrote: Reason for CRM: Bartholome, the patient's wife, called regarding difficulty obtaining Ozempic . The patient's insurance [facility]) and Medicaid have denied coverage, stating the medication is classified as a weight loss drug. Bartholome reports that the medication is being used to manage the patient's diabetes, and the prescription needs to be written accordingly to reflect its use for diabetes treatment. Bartholome is requesting a call back at [phone] >> Oct 30, 2023 12:16 PM Antwanette L wrote: Bartholome doesn't know which insurance provider denied the prescription

## 2023-11-01 ENCOUNTER — Other Ambulatory Visit: Payer: Self-pay

## 2023-11-01 NOTE — Telephone Encounter (Signed)
 Patient has been informed.

## 2023-11-05 ENCOUNTER — Other Ambulatory Visit: Payer: Self-pay | Admitting: Family Medicine

## 2023-11-05 ENCOUNTER — Other Ambulatory Visit: Payer: Self-pay

## 2023-11-05 DIAGNOSIS — I152 Hypertension secondary to endocrine disorders: Secondary | ICD-10-CM

## 2023-11-05 MED ORDER — AMLODIPINE BESYLATE 5 MG PO TABS
5.0000 mg | ORAL_TABLET | Freq: Every day | ORAL | 1 refills | Status: AC
  Filled 2023-11-05: qty 30, 30d supply, fill #0
  Filled 2023-12-01: qty 30, 30d supply, fill #1
  Filled 2023-12-28: qty 30, 30d supply, fill #2
  Filled 2024-01-30: qty 30, 30d supply, fill #3
  Filled 2024-02-29: qty 30, 30d supply, fill #4

## 2023-11-06 ENCOUNTER — Other Ambulatory Visit: Payer: Self-pay

## 2023-11-07 ENCOUNTER — Other Ambulatory Visit: Payer: Self-pay

## 2023-11-22 ENCOUNTER — Other Ambulatory Visit: Payer: Self-pay

## 2023-11-24 ENCOUNTER — Other Ambulatory Visit: Payer: Self-pay | Admitting: Family Medicine

## 2023-11-24 DIAGNOSIS — E1169 Type 2 diabetes mellitus with other specified complication: Secondary | ICD-10-CM

## 2023-11-26 ENCOUNTER — Other Ambulatory Visit: Payer: Self-pay

## 2023-11-26 NOTE — Telephone Encounter (Signed)
 Requested Prescriptions  Pending Prescriptions Disp Refills   atorvastatin  (LIPITOR) 20 MG tablet [Pharmacy Med Name: ATORVASTATIN  20 MG TABLET] 90 tablet 1    Sig: TAKE 1 TABLET BY MOUTH EVERY DAY     Cardiovascular:  Antilipid - Statins Failed - 11/26/2023  5:51 PM      Failed - Lipid Panel in normal range within the last 12 months    Cholesterol, Total  Date Value Ref Range Status  03/22/2023 153 100 - 199 mg/dL Final   LDL Chol Calc (NIH)  Date Value Ref Range Status  03/22/2023 88 0 - 99 mg/dL Final   HDL  Date Value Ref Range Status  03/22/2023 37 (L) >39 mg/dL Final   Triglycerides  Date Value Ref Range Status  03/22/2023 161 (H) 0 - 149 mg/dL Final         Passed - Patient is not pregnant      Passed - Valid encounter within last 12 months    Recent Outpatient Visits           2 months ago Type 2 diabetes mellitus with other specified complication, without long-term current use of insulin  (HCC)   [facility] [place]. [facility] Delbert Clam, MD   8 months ago Type 2 diabetes mellitus with other specified complication, without long-term current use of insulin  [facility])   [place] [facility] [place]. [facility] Delbert Clam, MD   1 year ago Other eczema   [place] [place]. [facility] Delbert Clam, MD   1 year ago Morbid obesity [facility])   [place] [facility] [place]. [facility] Delbert Clam, MD   1 year ago Primary hypertension    [facility] [place]. [facility] Brien Belvie BRAVO, MD       Future Appointments             In 3 months Delbert Clam, MD [facility] [place] - A Dept Of [place]. [facility], [place]

## 2023-11-29 ENCOUNTER — Other Ambulatory Visit: Payer: Self-pay

## 2023-12-01 ENCOUNTER — Other Ambulatory Visit: Payer: Self-pay

## 2023-12-03 ENCOUNTER — Other Ambulatory Visit: Payer: Self-pay

## 2023-12-05 ENCOUNTER — Other Ambulatory Visit: Payer: Self-pay | Admitting: Family Medicine

## 2023-12-05 ENCOUNTER — Other Ambulatory Visit: Payer: Self-pay

## 2023-12-26 ENCOUNTER — Other Ambulatory Visit: Payer: Self-pay

## 2024-01-04 ENCOUNTER — Other Ambulatory Visit: Payer: Self-pay

## 2024-01-22 ENCOUNTER — Other Ambulatory Visit: Payer: Self-pay

## 2024-02-04 ENCOUNTER — Other Ambulatory Visit: Payer: Self-pay

## 2024-02-19 ENCOUNTER — Other Ambulatory Visit: Payer: Self-pay

## 2024-02-29 ENCOUNTER — Other Ambulatory Visit: Payer: Self-pay

## 2024-03-19 ENCOUNTER — Ambulatory Visit: Admitting: Family Medicine
